# Patient Record
Sex: Female | Born: 1975
Health system: Southern US, Community
[De-identification: ages and names within clinical notes are randomized; demographics above are authoritative.]

## PROBLEM LIST (undated history)

## (undated) DIAGNOSIS — M1990 Unspecified osteoarthritis, unspecified site: Secondary | ICD-10-CM

## (undated) DIAGNOSIS — Z8669 Personal history of other diseases of the nervous system and sense organs: Secondary | ICD-10-CM

## (undated) DIAGNOSIS — L0390 Cellulitis, unspecified: Secondary | ICD-10-CM

## (undated) DIAGNOSIS — I10 Essential (primary) hypertension: Secondary | ICD-10-CM

## (undated) DIAGNOSIS — E785 Hyperlipidemia, unspecified: Secondary | ICD-10-CM

## (undated) DIAGNOSIS — Z1509 Genetic susceptibility to other malignant neoplasm: Secondary | ICD-10-CM

## (undated) DIAGNOSIS — R002 Palpitations: Secondary | ICD-10-CM

## (undated) DIAGNOSIS — I340 Nonrheumatic mitral (valve) insufficiency: Secondary | ICD-10-CM

## (undated) DIAGNOSIS — I209 Angina pectoris, unspecified: Secondary | ICD-10-CM

## (undated) DIAGNOSIS — I5189 Other ill-defined heart diseases: Secondary | ICD-10-CM

## (undated) DIAGNOSIS — T2002XA Burn of unspecified degree of lip(s), initial encounter: Secondary | ICD-10-CM

## (undated) DIAGNOSIS — I499 Cardiac arrhythmia, unspecified: Secondary | ICD-10-CM

## (undated) DIAGNOSIS — Z15068 Genetic susceptibility to other malignant neoplasm of digestive system: Secondary | ICD-10-CM

## (undated) DIAGNOSIS — I341 Nonrheumatic mitral (valve) prolapse: Secondary | ICD-10-CM

## (undated) DIAGNOSIS — K219 Gastro-esophageal reflux disease without esophagitis: Secondary | ICD-10-CM

## (undated) DIAGNOSIS — T7840XA Allergy, unspecified, initial encounter: Secondary | ICD-10-CM

## (undated) DIAGNOSIS — M6752 Plica syndrome, left knee: Secondary | ICD-10-CM

## (undated) DIAGNOSIS — G43009 Migraine without aura, not intractable, without status migrainosus: Secondary | ICD-10-CM

## (undated) DIAGNOSIS — K644 Residual hemorrhoidal skin tags: Secondary | ICD-10-CM

## (undated) DIAGNOSIS — Z9889 Other specified postprocedural states: Secondary | ICD-10-CM

## (undated) DIAGNOSIS — C4492 Squamous cell carcinoma of skin, unspecified: Secondary | ICD-10-CM

## (undated) DIAGNOSIS — IMO0002 Reserved for concepts with insufficient information to code with codable children: Secondary | ICD-10-CM

## (undated) DIAGNOSIS — D126 Benign neoplasm of colon, unspecified: Secondary | ICD-10-CM

## (undated) DIAGNOSIS — R011 Cardiac murmur, unspecified: Secondary | ICD-10-CM

## (undated) HISTORY — DX: Cardiac murmur, unspecified: R01.1

## (undated) HISTORY — DX: Residual hemorrhoidal skin tags: K64.4

## (undated) HISTORY — DX: Unspecified osteoarthritis, unspecified site: M19.90

## (undated) HISTORY — DX: Genetic susceptibility to other malignant neoplasm of digestive system: Z15.068

## (undated) HISTORY — DX: Essential (primary) hypertension: I10

## (undated) HISTORY — PX: TUBAL LIGATION: SHX77

## (undated) HISTORY — DX: Nonrheumatic mitral (valve) prolapse: I34.1

## (undated) HISTORY — DX: Genetic susceptibility to other malignant neoplasm: Z15.09

## (undated) HISTORY — PX: WISDOM TOOTH EXTRACTION: SHX21

## (undated) HISTORY — DX: Hyperlipidemia, unspecified: E78.5

## (undated) HISTORY — DX: Gastro-esophageal reflux disease without esophagitis: K21.9

## (undated) HISTORY — DX: Personal history of other diseases of the nervous system and sense organs: Z86.69

## (undated) HISTORY — DX: Benign neoplasm of colon, unspecified: D12.6

## (undated) HISTORY — PX: OTHER SURGICAL HISTORY: SHX169

## (undated) HISTORY — DX: Cellulitis, unspecified: L03.90

## (undated) HISTORY — DX: Reserved for concepts with insufficient information to code with codable children: IMO0002

## (undated) HISTORY — DX: Allergy, unspecified, initial encounter: T78.40XA

## (undated) HISTORY — DX: Squamous cell carcinoma of skin, unspecified: C44.92

## (undated) HISTORY — PX: POLYPECTOMY: SHX149

## (undated) HISTORY — PX: CARDIAC VALVE REPLACEMENT: SHX585

## (undated) HISTORY — PX: COLONOSCOPY: SHX174

## (undated) HISTORY — DX: Nonrheumatic mitral (valve) insufficiency: I34.0

## (undated) HISTORY — DX: Migraine without aura, not intractable, without status migrainosus: G43.009

## (undated) HISTORY — DX: Burn of unspecified degree of lip(s), initial encounter: T20.02XA

## (undated) HISTORY — PX: ABDOMINAL HYSTERECTOMY: SHX81

## (undated) HISTORY — DX: Palpitations: R00.2

---
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
## 1997-08-04 ENCOUNTER — Inpatient Hospital Stay (HOSPITAL_COMMUNITY): Admission: AD | Admit: 1997-08-04 | Discharge: 1997-08-04 | Payer: Self-pay | Admitting: Obstetrics

## 1997-08-13 ENCOUNTER — Inpatient Hospital Stay (HOSPITAL_COMMUNITY): Admission: AD | Admit: 1997-08-13 | Discharge: 1997-08-13 | Payer: Self-pay | Admitting: Obstetrics

## 1997-08-14 ENCOUNTER — Encounter (HOSPITAL_COMMUNITY): Admission: RE | Admit: 1997-08-14 | Discharge: 1997-08-16 | Payer: Self-pay | Admitting: Obstetrics & Gynecology

## 1997-08-15 ENCOUNTER — Inpatient Hospital Stay (HOSPITAL_COMMUNITY): Admission: AD | Admit: 1997-08-15 | Discharge: 1997-08-18 | Payer: Self-pay | Admitting: Obstetrics & Gynecology

## 1997-08-15 ENCOUNTER — Inpatient Hospital Stay (HOSPITAL_COMMUNITY): Admission: AD | Admit: 1997-08-15 | Discharge: 1997-08-16 | Payer: Self-pay | Admitting: *Deleted

## 1998-01-12 ENCOUNTER — Encounter: Admission: RE | Admit: 1998-01-12 | Discharge: 1998-01-12 | Payer: Self-pay | Admitting: *Deleted

## 1998-05-13 ENCOUNTER — Emergency Department (HOSPITAL_COMMUNITY): Admission: EM | Admit: 1998-05-13 | Discharge: 1998-05-13 | Payer: Self-pay | Admitting: Family Medicine

## 1998-08-31 ENCOUNTER — Inpatient Hospital Stay (HOSPITAL_COMMUNITY): Admission: AD | Admit: 1998-08-31 | Discharge: 1998-08-31 | Payer: Self-pay | Admitting: Obstetrics & Gynecology

## 1998-09-27 ENCOUNTER — Inpatient Hospital Stay (HOSPITAL_COMMUNITY): Admission: AD | Admit: 1998-09-27 | Discharge: 1998-09-27 | Payer: Self-pay | Admitting: Obstetrics

## 1998-10-08 ENCOUNTER — Ambulatory Visit (HOSPITAL_COMMUNITY): Admission: RE | Admit: 1998-10-08 | Discharge: 1998-10-08 | Payer: Self-pay | Admitting: *Deleted

## 1998-10-13 ENCOUNTER — Ambulatory Visit (HOSPITAL_COMMUNITY): Admission: RE | Admit: 1998-10-13 | Discharge: 1998-10-13 | Payer: Self-pay | Admitting: *Deleted

## 1998-11-13 ENCOUNTER — Ambulatory Visit (HOSPITAL_COMMUNITY): Admission: RE | Admit: 1998-11-13 | Discharge: 1998-11-13 | Payer: Self-pay | Admitting: *Deleted

## 1998-12-11 ENCOUNTER — Ambulatory Visit (HOSPITAL_COMMUNITY): Admission: RE | Admit: 1998-12-11 | Discharge: 1998-12-11 | Payer: Self-pay | Admitting: Obstetrics

## 1999-01-22 ENCOUNTER — Inpatient Hospital Stay (HOSPITAL_COMMUNITY): Admission: AD | Admit: 1999-01-22 | Discharge: 1999-01-22 | Payer: Self-pay | Admitting: *Deleted

## 1999-02-10 ENCOUNTER — Inpatient Hospital Stay (HOSPITAL_COMMUNITY): Admission: AD | Admit: 1999-02-10 | Discharge: 1999-02-10 | Payer: Self-pay | Admitting: Obstetrics & Gynecology

## 1999-02-11 ENCOUNTER — Encounter (HOSPITAL_COMMUNITY): Admission: RE | Admit: 1999-02-11 | Discharge: 1999-03-09 | Payer: Self-pay | Admitting: Obstetrics & Gynecology

## 1999-02-11 ENCOUNTER — Encounter: Payer: Self-pay | Admitting: Obstetrics & Gynecology

## 1999-02-24 ENCOUNTER — Encounter: Payer: Self-pay | Admitting: Obstetrics & Gynecology

## 1999-02-24 ENCOUNTER — Inpatient Hospital Stay (HOSPITAL_COMMUNITY): Admission: AD | Admit: 1999-02-24 | Discharge: 1999-02-24 | Payer: Self-pay | Admitting: Obstetrics

## 1999-03-08 ENCOUNTER — Inpatient Hospital Stay (HOSPITAL_COMMUNITY): Admission: AD | Admit: 1999-03-08 | Discharge: 1999-03-10 | Payer: Self-pay | Admitting: Obstetrics

## 1999-03-16 ENCOUNTER — Encounter (HOSPITAL_COMMUNITY): Admission: RE | Admit: 1999-03-16 | Discharge: 1999-06-14 | Payer: Self-pay | Admitting: Obstetrics

## 1999-04-16 ENCOUNTER — Encounter: Admission: RE | Admit: 1999-04-16 | Discharge: 1999-04-16 | Payer: Self-pay | Admitting: Obstetrics & Gynecology

## 1999-04-26 ENCOUNTER — Ambulatory Visit (HOSPITAL_COMMUNITY): Admission: RE | Admit: 1999-04-26 | Discharge: 1999-04-26 | Payer: Self-pay | Admitting: Obstetrics & Gynecology

## 1999-06-16 ENCOUNTER — Encounter (HOSPITAL_COMMUNITY): Admission: RE | Admit: 1999-06-16 | Discharge: 1999-09-14 | Payer: Self-pay | Admitting: *Deleted

## 1999-10-03 ENCOUNTER — Emergency Department (HOSPITAL_COMMUNITY): Admission: EM | Admit: 1999-10-03 | Discharge: 1999-10-03 | Payer: Self-pay | Admitting: Emergency Medicine

## 1999-10-15 ENCOUNTER — Encounter (HOSPITAL_COMMUNITY): Admission: RE | Admit: 1999-10-15 | Discharge: 2000-01-13 | Payer: Self-pay | Admitting: *Deleted

## 2000-01-14 ENCOUNTER — Encounter: Admission: RE | Admit: 2000-01-14 | Discharge: 2000-04-13 | Payer: Self-pay | Admitting: *Deleted

## 2000-01-27 ENCOUNTER — Inpatient Hospital Stay (HOSPITAL_COMMUNITY): Admission: EM | Admit: 2000-01-27 | Discharge: 2000-01-27 | Payer: Self-pay | Admitting: *Deleted

## 2000-02-03 ENCOUNTER — Encounter: Payer: Self-pay | Admitting: Endocrinology

## 2000-02-03 ENCOUNTER — Ambulatory Visit (HOSPITAL_COMMUNITY): Admission: RE | Admit: 2000-02-03 | Discharge: 2000-02-03 | Payer: Self-pay | Admitting: Endocrinology

## 2000-04-15 ENCOUNTER — Encounter: Admission: RE | Admit: 2000-04-15 | Discharge: 2000-07-14 | Payer: Self-pay | Admitting: *Deleted

## 2000-07-16 ENCOUNTER — Encounter: Admission: RE | Admit: 2000-07-16 | Discharge: 2000-08-22 | Payer: Self-pay | Admitting: *Deleted

## 2000-12-23 ENCOUNTER — Emergency Department (HOSPITAL_COMMUNITY): Admission: EM | Admit: 2000-12-23 | Discharge: 2000-12-23 | Payer: Self-pay | Admitting: *Deleted

## 2000-12-27 ENCOUNTER — Emergency Department (HOSPITAL_COMMUNITY): Admission: EM | Admit: 2000-12-27 | Discharge: 2000-12-27 | Payer: Self-pay | Admitting: Emergency Medicine

## 2001-03-22 ENCOUNTER — Other Ambulatory Visit: Admission: RE | Admit: 2001-03-22 | Discharge: 2001-03-22 | Payer: Self-pay | Admitting: Internal Medicine

## 2001-06-24 ENCOUNTER — Emergency Department (HOSPITAL_COMMUNITY): Admission: EM | Admit: 2001-06-24 | Discharge: 2001-06-24 | Payer: Self-pay | Admitting: Emergency Medicine

## 2001-06-24 ENCOUNTER — Encounter: Payer: Self-pay | Admitting: Emergency Medicine

## 2001-10-08 ENCOUNTER — Emergency Department (HOSPITAL_COMMUNITY): Admission: EM | Admit: 2001-10-08 | Discharge: 2001-10-08 | Payer: Self-pay | Admitting: Emergency Medicine

## 2002-10-10 ENCOUNTER — Inpatient Hospital Stay (HOSPITAL_COMMUNITY): Admission: AD | Admit: 2002-10-10 | Discharge: 2002-10-10 | Payer: Self-pay | Admitting: *Deleted

## 2002-10-12 ENCOUNTER — Inpatient Hospital Stay (HOSPITAL_COMMUNITY): Admission: AD | Admit: 2002-10-12 | Discharge: 2002-10-12 | Payer: Self-pay | Admitting: *Deleted

## 2002-10-14 ENCOUNTER — Inpatient Hospital Stay (HOSPITAL_COMMUNITY): Admission: AD | Admit: 2002-10-14 | Discharge: 2002-10-14 | Payer: Self-pay | Admitting: Obstetrics and Gynecology

## 2002-10-16 ENCOUNTER — Inpatient Hospital Stay (HOSPITAL_COMMUNITY): Admission: AD | Admit: 2002-10-16 | Discharge: 2002-10-16 | Payer: Self-pay | Admitting: Obstetrics and Gynecology

## 2003-06-16 ENCOUNTER — Inpatient Hospital Stay (HOSPITAL_COMMUNITY): Admission: AD | Admit: 2003-06-16 | Discharge: 2003-06-16 | Payer: Self-pay | Admitting: Family Medicine

## 2004-01-22 ENCOUNTER — Emergency Department (HOSPITAL_COMMUNITY): Admission: EM | Admit: 2004-01-22 | Discharge: 2004-01-22 | Payer: Self-pay | Admitting: *Deleted

## 2004-04-05 ENCOUNTER — Other Ambulatory Visit: Admission: RE | Admit: 2004-04-05 | Discharge: 2004-04-05 | Payer: Self-pay | Admitting: Obstetrics and Gynecology

## 2004-06-02 ENCOUNTER — Ambulatory Visit: Payer: Self-pay | Admitting: Gastroenterology

## 2005-01-21 ENCOUNTER — Other Ambulatory Visit: Admission: RE | Admit: 2005-01-21 | Discharge: 2005-01-21 | Payer: Self-pay | Admitting: Obstetrics and Gynecology

## 2005-08-19 ENCOUNTER — Ambulatory Visit: Payer: Self-pay | Admitting: Internal Medicine

## 2005-11-15 ENCOUNTER — Encounter: Admission: RE | Admit: 2005-11-15 | Discharge: 2005-11-15 | Payer: Self-pay | Admitting: Internal Medicine

## 2005-12-28 ENCOUNTER — Ambulatory Visit: Payer: Self-pay | Admitting: Internal Medicine

## 2006-01-09 ENCOUNTER — Ambulatory Visit: Payer: Self-pay

## 2006-01-09 ENCOUNTER — Encounter: Payer: Self-pay | Admitting: Cardiology

## 2006-01-12 ENCOUNTER — Ambulatory Visit: Payer: Self-pay | Admitting: Cardiology

## 2006-03-22 ENCOUNTER — Ambulatory Visit: Payer: Self-pay | Admitting: Gastroenterology

## 2006-08-06 ENCOUNTER — Emergency Department (HOSPITAL_COMMUNITY): Admission: EM | Admit: 2006-08-06 | Discharge: 2006-08-06 | Payer: Self-pay | Admitting: Emergency Medicine

## 2006-08-11 ENCOUNTER — Ambulatory Visit: Payer: Self-pay | Admitting: Cardiology

## 2006-10-17 ENCOUNTER — Other Ambulatory Visit: Admission: RE | Admit: 2006-10-17 | Discharge: 2006-10-17 | Payer: Self-pay | Admitting: Obstetrics and Gynecology

## 2007-03-16 ENCOUNTER — Encounter: Admission: RE | Admit: 2007-03-16 | Discharge: 2007-03-16 | Payer: Self-pay | Admitting: Gastroenterology

## 2007-04-02 ENCOUNTER — Encounter: Admission: RE | Admit: 2007-04-02 | Discharge: 2007-04-02 | Payer: Self-pay | Admitting: Gastroenterology

## 2007-08-01 ENCOUNTER — Encounter (INDEPENDENT_AMBULATORY_CARE_PROVIDER_SITE_OTHER): Payer: Self-pay | Admitting: Family Medicine

## 2007-08-01 ENCOUNTER — Ambulatory Visit (HOSPITAL_COMMUNITY): Admission: RE | Admit: 2007-08-01 | Discharge: 2007-08-01 | Payer: Self-pay | Admitting: Family Medicine

## 2007-08-01 ENCOUNTER — Encounter: Payer: Self-pay | Admitting: Internal Medicine

## 2007-08-01 ENCOUNTER — Ambulatory Visit: Payer: Self-pay | Admitting: Vascular Surgery

## 2007-11-06 ENCOUNTER — Ambulatory Visit: Payer: Self-pay | Admitting: Internal Medicine

## 2007-11-06 DIAGNOSIS — J309 Allergic rhinitis, unspecified: Secondary | ICD-10-CM | POA: Insufficient documentation

## 2007-11-06 DIAGNOSIS — F329 Major depressive disorder, single episode, unspecified: Secondary | ICD-10-CM

## 2007-11-06 DIAGNOSIS — K219 Gastro-esophageal reflux disease without esophagitis: Secondary | ICD-10-CM | POA: Insufficient documentation

## 2007-11-23 ENCOUNTER — Other Ambulatory Visit: Admission: RE | Admit: 2007-11-23 | Discharge: 2007-11-23 | Payer: Self-pay | Admitting: Obstetrics and Gynecology

## 2008-03-18 ENCOUNTER — Encounter: Payer: Self-pay | Admitting: Internal Medicine

## 2008-03-26 ENCOUNTER — Ambulatory Visit: Payer: Self-pay | Admitting: Internal Medicine

## 2008-03-26 DIAGNOSIS — G9340 Encephalopathy, unspecified: Secondary | ICD-10-CM | POA: Insufficient documentation

## 2008-03-26 DIAGNOSIS — G43009 Migraine without aura, not intractable, without status migrainosus: Secondary | ICD-10-CM

## 2008-03-27 ENCOUNTER — Telehealth (INDEPENDENT_AMBULATORY_CARE_PROVIDER_SITE_OTHER): Payer: Self-pay | Admitting: *Deleted

## 2008-04-09 ENCOUNTER — Ambulatory Visit: Payer: Self-pay | Admitting: Internal Medicine

## 2008-04-09 LAB — CONVERTED CEMR LAB
Crystals: NEGATIVE
Ketones, ur: NEGATIVE mg/dL
Leukocytes, UA: NEGATIVE
Nitrite: NEGATIVE
Sed Rate: 15 mm/hr (ref 0–22)
Specific Gravity, Urine: 1.025 (ref 1.000–1.03)
Urobilinogen, UA: 0.2 (ref 0.0–1.0)
pH: 6.5 (ref 5.0–8.0)

## 2008-04-10 ENCOUNTER — Encounter: Payer: Self-pay | Admitting: Internal Medicine

## 2008-04-10 DIAGNOSIS — A6000 Herpesviral infection of urogenital system, unspecified: Secondary | ICD-10-CM | POA: Insufficient documentation

## 2008-04-10 LAB — CONVERTED CEMR LAB

## 2008-07-14 ENCOUNTER — Ambulatory Visit: Payer: Self-pay | Admitting: Cardiology

## 2008-07-14 LAB — CONVERTED CEMR LAB
BUN: 9 mg/dL (ref 6–23)
CO2: 25 meq/L (ref 19–32)
Sodium: 140 meq/L (ref 135–145)

## 2008-07-21 ENCOUNTER — Encounter: Payer: Self-pay | Admitting: Cardiology

## 2008-07-21 ENCOUNTER — Ambulatory Visit: Payer: Self-pay

## 2008-07-28 ENCOUNTER — Ambulatory Visit: Payer: Self-pay | Admitting: Internal Medicine

## 2008-07-28 ENCOUNTER — Telehealth (INDEPENDENT_AMBULATORY_CARE_PROVIDER_SITE_OTHER): Payer: Self-pay | Admitting: *Deleted

## 2008-07-29 ENCOUNTER — Emergency Department (HOSPITAL_COMMUNITY): Admission: EM | Admit: 2008-07-29 | Discharge: 2008-07-29 | Payer: Self-pay | Admitting: Emergency Medicine

## 2008-07-31 ENCOUNTER — Telehealth (INDEPENDENT_AMBULATORY_CARE_PROVIDER_SITE_OTHER): Payer: Self-pay | Admitting: *Deleted

## 2008-08-04 ENCOUNTER — Ambulatory Visit: Payer: Self-pay | Admitting: Cardiology

## 2008-12-29 ENCOUNTER — Ambulatory Visit: Payer: Self-pay | Admitting: Internal Medicine

## 2009-02-05 ENCOUNTER — Ambulatory Visit: Payer: Self-pay | Admitting: Internal Medicine

## 2009-07-16 ENCOUNTER — Ambulatory Visit: Payer: Self-pay | Admitting: Internal Medicine

## 2009-08-14 ENCOUNTER — Telehealth: Payer: Self-pay | Admitting: Internal Medicine

## 2009-09-28 ENCOUNTER — Ambulatory Visit: Payer: Self-pay | Admitting: Cardiology

## 2009-09-28 DIAGNOSIS — R002 Palpitations: Secondary | ICD-10-CM | POA: Insufficient documentation

## 2009-09-28 DIAGNOSIS — I059 Rheumatic mitral valve disease, unspecified: Secondary | ICD-10-CM | POA: Insufficient documentation

## 2009-10-13 ENCOUNTER — Ambulatory Visit: Payer: Self-pay | Admitting: Internal Medicine

## 2009-10-13 DIAGNOSIS — L03211 Cellulitis of face: Secondary | ICD-10-CM

## 2009-10-13 DIAGNOSIS — T2002XA Burn of unspecified degree of lip(s), initial encounter: Secondary | ICD-10-CM | POA: Insufficient documentation

## 2009-10-13 DIAGNOSIS — L0201 Cutaneous abscess of face: Secondary | ICD-10-CM

## 2009-10-22 ENCOUNTER — Other Ambulatory Visit: Admission: RE | Admit: 2009-10-22 | Discharge: 2009-10-22 | Payer: Self-pay | Admitting: Obstetrics and Gynecology

## 2010-01-25 ENCOUNTER — Telehealth: Payer: Self-pay | Admitting: Gastroenterology

## 2010-01-25 ENCOUNTER — Encounter: Payer: Self-pay | Admitting: Physician Assistant

## 2010-01-25 ENCOUNTER — Ambulatory Visit: Payer: Self-pay | Admitting: Internal Medicine

## 2010-01-25 DIAGNOSIS — K644 Residual hemorrhoidal skin tags: Secondary | ICD-10-CM | POA: Insufficient documentation

## 2010-01-25 DIAGNOSIS — K648 Other hemorrhoids: Secondary | ICD-10-CM | POA: Insufficient documentation

## 2010-01-25 DIAGNOSIS — K625 Hemorrhage of anus and rectum: Secondary | ICD-10-CM

## 2010-02-12 ENCOUNTER — Ambulatory Visit: Payer: Self-pay | Admitting: Gastroenterology

## 2010-02-16 ENCOUNTER — Encounter: Payer: Self-pay | Admitting: Gastroenterology

## 2010-02-17 ENCOUNTER — Telehealth: Payer: Self-pay | Admitting: Internal Medicine

## 2010-03-03 ENCOUNTER — Telehealth: Payer: Self-pay | Admitting: Cardiology

## 2010-08-03 NOTE — Progress Notes (Signed)
Summary: WEIGHT LOSS  Phone Note Call from Patient Call back at Work Phone [phone]   Summary of Call: Patient is requesting rx to help with weight loss.  Initial call taken by: Lamar Sprinkles, CMA,  August 14, 2009 8:46 AM  Follow-up for Phone Call        the  best advice that actually works is to be more active, and take in less calories; I would suggest W[employer] Wat[employer], as this is the ONLY thing that has been shown to work for LONG term wt loss Follow-up by: Corwin Levins MD,  August 14, 2009 9:26 AM  Additional Follow-up for Phone Call Additional follow up Details #1::        pt informed of MD's advise Additional Follow-up by: Margaret Pyle, CMA,  August 14, 2009 10:55 AM

## 2010-08-03 NOTE — Letter (Signed)
Summary: [facility] Instructions  [facility]  [address] [place], [place] [address]   Phone: [phone]  Fax: [phone]       [name]    10-Nov-1975    MRN: [id number]        Procedure Day /Date:FRIDAY 02/12/2010     Arrival Time:1PM     Procedure Time:2PM     Location of Procedure:                    X   [facility] (4th Floor)                        PREPARATION FOR COLONOSCOPY WITH MOVIPREP   Starting 5 days prior to your procedure 02/07/2010 do not eat nuts, seeds, popcorn, corn, beans, peas,  salads, or any raw vegetables.  Do not take any fiber supplements (e.g. Metamucil, Citrucel, and Benefiber).  THE DAY BEFORE YOUR PROCEDURE         DATE: 02/11/2010 [id number]  1.  Drink clear liquids the entire day-NO SOLID FOOD  2.  Do not drink anything colored red or purple.  Avoid juices with pulp.  No orange juice.  3.  Drink at least 64 oz. (8 glasses) of fluid/clear liquids during the day to prevent dehydration and help the prep work efficiently.  CLEAR LIQUIDS INCLUDE: Water Jello Ice Popsicles Tea (sugar ok, no milk/cream) Powdered fruit flavored drinks Coffee (sugar ok, no milk/cream) Gatorade Juice: apple, white grape, white cranberry  Lemonade Clear bullion, consomm, broth Carbonated beverages (any kind) Strained chicken noodle soup Hard Candy                             4.  In the morning, mix first dose of MoviPrep solution:    Empty 1 Pouch A and 1 Pouch B into the disposable container    Add lukewarm drinking water to the top line of the container. Mix to dissolve    Refrigerate (mixed solution should be used within 24 hrs)  5.  Begin drinking the prep at 5:00 p.m. The MoviPrep container is divided by 4 marks.   Every 15 minutes drink the solution down to the next mark (approximately 8 oz) until the full liter is complete.   6.  Follow completed prep with 16 oz of clear liquid of your choice (Nothing red  or purple).  Continue to drink clear liquids until bedtime.  7.  Before going to bed, mix second dose of MoviPrep solution:    Empty 1 Pouch A and 1 Pouch B into the disposable container    Add lukewarm drinking water to the top line of the container. Mix to dissolve    Refrigerate  THE DAY OF YOUR PROCEDURE      DATE:02/12/2010 [id number]  Beginning at 9a.m. (5 hours before procedure):         1. Every 15 minutes, drink the solution down to the next mark (approx 8 oz) until the full liter is complete.  2. Follow completed prep with 16 oz. of clear liquid of your choice.    3. You may drink clear liquids until 12PM (2 HOURS BEFORE PROCEDURE).   MEDICATION INSTRUCTIONS  Unless otherwise instructed, you should take regular prescription medications with a small sip of water   as early as possible the morning of your procedure.  OTHER INSTRUCTIONS  You will need a responsible adult at least 36 years of age to accompany you and drive you home.   This person must remain in the waiting room during your procedure.  Wear loose fitting clothing that is easily removed.  Leave jewelry and other valuables at home.  However, you may wish to bring a book to read or  an iPod/MP3 player to listen to music as you wait for your procedure to start.  Remove all body piercing jewelry and leave at home.  Total time from sign-in until discharge is approximately 2-3 hours.  You should go home directly after your procedure and rest.  You can resume normal activities the  day after your procedure.  The day of your procedure you should not:   Drive   Make legal decisions   Operate machinery   Drink alcohol   Return to work  You will receive specific instructions about eating, activities and medications before you leave.    The above instructions have been reviewed and explained to me by   _______________________    I fully understand and can verbalize these instructions  _____________________________ Date _________

## 2010-08-03 NOTE — Progress Notes (Signed)
Summary: RX PROBLEM  Phone Note Call from Patient Call back at Home Phone [phone] Call back at Work Phone [phone]   Summary of Call: Pt says klonopin has not helped at all, does she need increased dose or alt med? Please advise.  Initial call taken by: Lamar Sprinkles, CMA,  February 17, 2010 11:36 AM  Follow-up for Phone Call        I would not increase the dose, but we can add generic prozac - 20 mg per day, as this normally helps Follow-up by: Corwin Levins MD,  February 17, 2010 12:20 PM  Additional Follow-up for Phone Call Additional follow up Details #1::        Pt informed Additional Follow-up by: Margaret Pyle, CMA,  February 17, 2010 2:04 PM    New/Updated Medications: FLUOXETINE HCL 20 MG CAPS (FLUOXETINE HCL) 1po once daily Prescriptions: FLUOXETINE HCL 20 MG CAPS (FLUOXETINE HCL) 1po once daily  #90 x 3   Entered and Authorized by:   Corwin Levins MD   Signed by:   Corwin Levins MD on 02/17/2010   Method used:   Electronically to        [facility] [address] [phone]* (retail)       [address] [place], [place]  [address]       Ph: [phone]       Fax: [phone]   RxID:   [id number]

## 2010-08-03 NOTE — Assessment & Plan Note (Signed)
Summary: ANXIOUS FEELING STC   Vital Signs:  Patient profile:   35 year old female Height:      64 inches (162.56 cm) Weight:      171.38 pounds (77.90 kg) O2 Sat:      99 % on Room air Temp:     99.1 degrees F (37.28 degrees C) oral Pulse rate:   95 / minute Pulse rhythm:   regular Resp:     16 per minute BP sitting:   112 / 80  (left arm) Cuff size:   regular  Vitals Entered By: Josph Macho CMA (July 16, 2009 3:21 PM)  O2 Flow:  Room air CC: Anxious Feeling X53months but worse X1week/ CF, Depression   CC:  Anxious Feeling X7months but worse X1week/ CF and Depression.  Depression History:      The patient is having a depressed mood most of the day and has a diminished interest in her usual daily activities.  Positive alarm features for depression include significant weight gain, insomnia, and fatigue (loss of energy).  However, she denies significant weight loss, hypersomnia, psychomotor agitation, psychomotor retardation, feelings of worthlessness (guilt), impaired concentration (indecisiveness), and recurrent thoughts of death or suicide.  The patient denies symptoms of a manic disorder including persistently & abnormally elevated mood, abnormally & persistently irritable mood, distractibility, flight of ideas, excessive buying sprees, and excessive sexual indiscretions.        Psychosocial stress factors include major life changes.  Risk factors for depression include a personal history of depression.  The patient denies that she feels like life is not worth living, denies that she wishes that she were dead, and denies that she has thought about ending her life.         Preventive Screening-Counseling & Management  Alcohol-Tobacco     Alcohol drinks/day: 0     Smoking Status: quit  Hep-HIV-STD-Contraception     Hepatitis Risk: no risk noted     HIV Risk: no risk noted     STD Risk: no risk noted      Sexual History:  currently monogamous.        Drug Use:  no.         Blood Transfusions:  no.    Clinical Review Panels:  Complete Metabolic Panel   Glucose:  99 (07/14/2008)   Sodium:  140 (07/14/2008)   Potassium:  4.3 (07/14/2008)   Chloride:  109 (07/14/2008)   CO2:  25 (07/14/2008)   BUN:  9 (07/14/2008)   Creatinine:  0.8 (07/14/2008)   Calcium:  9.1 (07/14/2008)   Medications Prior to Update: 1)  Clarinex 5 Mg Tabs (Desloratadine) .... Once Daily For 5 Days 2)  Epipen 2-Pak 0.3 Mg/0.29ml Devi (Epinephrine) .... Use As Directed  Current Medications (verified): 1)  Clarinex 5 Mg Tabs (Desloratadine) .... Once Daily For 5 Days 2)  Epipen 2-Pak 0.3 Mg/0.90ml Devi (Epinephrine) .... Use As Directed 3)  Solodyn 85 Mg .Marland Kitchen.. 1 Once Daily 4)  Clarifoam 5)  Differin  Allergies (verified): No Known Drug Allergies  Past History:  Past Medical History: Reviewed history from 07/14/2008 and no changes required. MVP Mild MR Palpitations Symptomatic PVCs GERD Allergic rhinitis Depression Migraine  Past Surgical History: Reviewed history from 11/06/2007 and no changes required. Tubal ligation  Family History: Reviewed history from 11/06/2007 and no changes required. 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  The patient denies anorexia, chest pain, and abdominal pain.   Psych:  Complains of anxiety, easily angered, irritability, and panic attacks; denies alternate hallucination ( auditory/visual), sense of great danger, suicidal thoughts/plans, thoughts of violence, unusual visions or sounds, and thoughts /plans of harming others.  Physical Exam  General:  alert,  well-developed, well-nourished, well-hydrated, normal appearance, cooperative to examination, good hygiene, and overweight-appearing.   Mouth:  Oral mucosa and oropharynx without lesions or exudates.  Teeth in good repair. Neck:  supple, full ROM, no masses, no carotid bruits, and no neck tenderness.   Lungs:  Normal respiratory effort, chest expands symmetrically. Lungs are clear to auscultation, no crackles or wheezes. Heart:  Normal rate and regular rhythm. S1 and S2 normal without gallop, murmur, click, rub or other extra sounds. Skin:  turgor normal, color normal, no rashes, no suspicious lesions, no ecchymoses, no petechiae, no purpura, no ulcerations, and no edema.   Psych:  Oriented X3, memory intact for recent and remote, good eye contact, not anxious appearing, not agitated, not suicidal, not homicidal, flat affect, and subdued.     Impression & Recommendations:  Problem # 1:  DEPRESSION (ICD-311) Assessment Deteriorated  Her updated medication list for this problem includes:    Cymbalta 30 Mg Cpep (Duloxetine hcl) ..... One by mouth once daily    Klonopin 1 Mg Tabs (Clonazepam) ..... One by mouth two times a day as needed anxiety/panic  Discussed treatment options, including trial of antidpressant medication. Will refer to behavioral health. Follow-up call in in 24-48 hours and recheck in 2 weeks, sooner as needed. Patient agrees to call if any worsening of symptoms or thoughts of doing harm arise. Verified that the patient has no suicidal ideation at this time.   Complete Medication List: 1)  Clarinex 5 Mg Tabs (Desloratadine) .... Once daily for 5 days 2)  Epipen 2-pak 0.3 Mg/0.15ml Devi (Epinephrine) .... Use as directed 3)  Solodyn 85 Mg  .Marland Kitchen.. 1 once daily 4)  Clarifoam  5)  Differin  6)  Cymbalta 30 Mg Cpep (Duloxetine hcl) .... One by mouth once daily 7)  Klonopin 1 Mg Tabs (Clonazepam) .... One by mouth two times a day as needed anxiety/panic  Patient Instructions: 1)   Please schedule a follow-up appointment in 1 month. Prescriptions: KLONOPIN 1 MG TABS (CLONAZEPAM) One by mouth two times a day as needed anxiety/panic  #60 x 2   Entered and Authorized by:   Etta Grandchild MD   Signed by:   Etta Grandchild MD on 07/16/2009   Method used:   Print then Give to Patient   RxID:   [id number] CYMBALTA 30 MG CPEP (DULOXETINE HCL) one by mouth once daily  #42 x 0   Entered and Authorized by:   Etta Grandchild MD   Signed by:   Etta Grandchild MD on 07/16/2009   Method used:   Samples Given   RxID:   [id number]

## 2010-08-03 NOTE — Assessment & Plan Note (Signed)
Summary: F1Y/JSS  Medications Added * CLARIFOAM two times a day  for skin face * DIFFERIN once daily for skin      Allergies Added: NKDA  Visit Type:  1 yr f/u Primary Provider:  Corwin Levins MD  CC:  shortness of breath and still dizziness sometimes.  History of Present Illness: [name] comes in today for evaluation and management of her mitral valve prolapse and palpitations. She has an atypical chest pain at times and spontaneous.  She has lost about 16 pounds and is exercising on a regular basis. Her symptoms are better with exercise.  Her last echocardiogram was in January 2010. She is mild to moderate prolapse of both mitral valve leaflets with mild to moderate mitral regurgitation. Left atrium, left ventricle, and right sided function and size are normal. We also check blood work including potassium magnesium and TSH which were normal.  Current Medications (verified): 1)  Epipen 2-Pak 0.3 Mg/0.7ml Devi (Epinephrine) .... Use As Directed 2)  Solodyn 85 Mg .Marland Kitchen.. 1 Once Daily 3)  Clarifoam .... Two Times A Day  For Skin Face 4)  Differin .... Once Daily For Skin 5)  Klonopin 1 Mg Tabs (Clonazepam) .... 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 Neck:  Neck supple, no JVD. No masses, thyromegaly or abnormal cervical nodes. Chest Sawyer Kahan:  no deformities or breast masses noted Lungs:  Clear bilaterally to auscultation and percussion. Heart:  regular rate and rhythm, normal S1-S2, midsystolic click, soft systolic murmur at the apex Abdomen:  Bowel sounds positive; abdomen soft and non-tender without masses, organomegaly, or hernias noted. No hepatosplenomegaly. Msk:  Back normal, normal gait. Muscle strength and tone normal. Pulses:  pulses normal in all 4 extremities Extremities:  No clubbing or cyanosis. Neurologic:  Alert and oriented x 3. Skin:  Intact without lesions or rashes. Psych:  Normal affect.   Problems:  Medical Problems Added: 1)  Dx of Palpitations  (ICD-785.1) 2)  Dx of Mitral Valve Prolapse, Non-rheumatic  (ICD-424.0)  EKG  Procedure date:  09/28/2009  Findings:      normal sinus rhythm, no change, early repolarization  Impression & Recommendations:  Problem # 1:  MITRAL VALVE PROLAPSE, NON-RHEUMATIC (ICD-424.0) I explained to her at length the findings of the echocardiogram in January of 2010 heart model. She has mild to moderate prolapse of both leaflets with mild to moderate  mitral regurgitation. Her left atrium was still normal in size as well as LV function. There is no evidence of pulmonary hypertension. We'll can continue to treat conservatively for now. She does not need prophylaxis for dental procedures. I will see her back in a year. Exercise was encouraged as was continued weight loss. Orders: EKG w/ Interpretation (93000)  Problem # 2:  PALPITATIONS  (ICD-785.1) Assessment: Unchanged Feel benign and related to her prolapse. Exercise encouraged. Symptoms of atrial fib or SVT reviewed.  Patient Instructions: 1)  Your physician recommends that you schedule a follow-up appointment in: YEAR WITH DR Damarco Keysor 2)  Your physician recommends that you continue on your current medications as directed. Please refer to the Current Medication list given to you today.

## 2010-08-03 NOTE — Progress Notes (Signed)
Summary: Triage   Phone Note Call from Patient Call back at Work Phone [phone]   Caller: Patient Call For: Dr. Russella Dar Reason for Call: Talk to Nurse Summary of Call: painful, swollen hemorroids. Rectal bleeding....requesting to be seen soon Initial call taken by: Karna Christmas,  January 25, 2010 8:28 AM  Follow-up for Phone Call        patient c/o severe rectal pain and burning.  She believes this is hemorrhoidal.  Patient  will come in this afternoon and see Mike Gip PA  Follow-up by: Darcey Nora RN, CGRN,  January 25, 2010 8:50 AM

## 2010-08-03 NOTE — Progress Notes (Signed)
Summary: CT SCAN  Phone Note Call from Patient Call back at Home Phone [phone]   Caller: [phone] Call For: Corwin Levins MD Summary of Call: PER .PT CALL WANT TO HAVE A CT SCAN , WANT TO BE REFERED FOR HER BAD HEADACHE Initial call taken by: Shelbie Proctor,  July 31, 2008 9:59 AM  Follow-up for Phone Call        ok for ct head and ct sinus without contrast - ordered  Follow-up by: Corwin Levins MD,  July 31, 2008 10:52 AM  Additional Follow-up for Phone Call Additional follow up Details #1::        CALLED PT TO INFORM THAT [facility] WILL CONTACT HER HER REGUARDING APPT MADE AWARE Additional Follow-up by: Shelbie Proctor,  July 31, 2008 11:13 AM  New Problems: HEADACHE (ICD-784.0)   New Problems: HEADACHE (ICD-784.0)

## 2010-08-03 NOTE — Assessment & Plan Note (Signed)
Summary: BURN ON LIP/ NWS   Vital Signs:  Patient profile:   35 year old female Height:      63 inches Weight:      160.25 pounds BMI:     28.49 O2 Sat:      98 % on Room air Temp:     98.2 degrees F oral Pulse rate:   72 / minute BP sitting:   110 / 62  (left arm) Cuff size:   regular  Vitals Entered ByZella Ball Ewing (October 13, 2009 1:55 PM)  O2 Flow:  Room air CC: Burn on Lip/RE   Primary Care Provider:  Corwin Levins MD  CC:  Burn on Lip/RE.  History of Present Illness: here with onset lip problem since sunday AM last when she accidently touched the lower lip on the right wtih a hot utensil;  had immediate pain and minor swelling, but really starting getting more swollen, tender,  and small pus like material expressed this AM, all without fever , headache, malaise or other swelling such as submandibular LNodes;  Also with some redness to the right face near the lip as well nondiscrete with no swelling, and no red streaks.    Problems Prior to Update: 1)  Cellulitis, Face, Right  (ICD-682.0) 2)  Burn of Unspecified Degree of Lip  (ICD-941.03) 3)  Palpitations  (ICD-785.1) 4)  Mitral Valve Prolapse, Non-rheumatic  (ICD-424.0) 5)  Genital Herpes  (ICD-054.10) 6)  Common Migraine  (ICD-346.10) 7)  Depression  (ICD-311) 8)  Allergic Rhinitis  (ICD-477.9) 9)  Gerd  (ICD-530.81)  Medications Prior to Update: 1)  Epipen 2-Pak 0.3 Mg/0.57ml Devi (Epinephrine) .... Use As Directed 2)  Solodyn 85 Mg .Marland Kitchen.. 1 Once Daily 3)  Clarifoam .... Two Times A Day  For Skin Face 4)  Differin .... Once Daily For Skin 5)  Klonopin 1 Mg Tabs (Clonazepam) .... One By Mouth Two Times A Day As Needed Anxiety/panic  Current Medications (verified): 1)  Epipen 2-Pak 0.3 Mg/0.26ml Devi (Epinephrine) .... Use As Directed 2)  Solodyn 85 Mg .Marland Kitchen.. 1 Once Daily 3)  Clarifoam .... Two Times A Day  For Skin Face 4)  Differin .... Once Daily For Skin 5)  Klonopin 1 Mg Tabs (Clonazepam) .... One By Mouth Two  Times A Day As Needed Anxiety/panic 6)  Silvadene 1 % Crea (Silver Sulfadiazine) .... Use Asd To Affected Area Three Times A Day Until Healed 7)  Doxycycline Hyclate 100 Mg Caps (Doxycycline Hyclate) .Marland Kitchen.. 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   Head:  normocephalic and atraumatic.   Eyes:  vision grossly intact, pupils equal, and pupils round.   Ears:  R ear normal and L ear normal.   Nose:  no external deformity and no nasal discharge.   Mouth:  no gingival abnormalities and pharynx pink and moist.  , but right lower lip 1.5 cm area with 2+ sweling, tender but non fluctuant and  no material expressed, also some mild erythema nondiscrete without swelling adjacent right face  Neck:  supple and no masses.   Lungs:  normal respiratory effort and normal breath sounds.   Heart:  normal rate and regular rhythm.   Extremities:  no edema, no erythema    Impression & Recommendations:  Problem # 1:  BURN OF UNSPECIFIED DEGREE OF LIP (ICD-941.03) left lower lateral lip  - for silvadene  cream as directed  Problem # 2:  CELLULITIS, FACE, RIGHT (ICD-682.0)  Her updated medication list for this problem includes:    Doxycycline Hyclate 100 Mg Caps (Doxycycline hyclate) .Marland Kitchen... 1po two times a day treat as above, f/u any worsening signs or symptoms   Complete Medication List: 1)  Epipen 2-pak 0.3 Mg/0.68ml  Devi (Epinephrine) .... Use as directed 2)  Solodyn 85 Mg  .Marland Kitchen.. 1 once daily 3)  Clarifoam  .... Two times a day  for skin face 4)  Differin  .... Once daily for skin 5)  Klonopin 1 Mg Tabs (Clonazepam) .... One by mouth two times a day as needed anxiety/panic 6)  Silvadene 1 % Crea (Silver sulfadiazine) .... Use asd to affected area three times a day until healed 7)  Doxycycline Hyclate 100 Mg Caps (Doxycycline hyclate) .Marland Kitchen.. 1po two times a day  Patient Instructions: 1)  Please take all new medications as prescribed 2)  Continue all previous medications as before this visit  3)  Please schedule a follow-up appointment as needed. Prescriptions: DOXYCYCLINE HYCLATE 100 MG CAPS (DOXYCYCLINE HYCLATE) 1po two times a day  #20 x 0   Entered and Authorized by:   Corwin Levins MD   Signed by:   Corwin Levins MD on 10/13/2009   Method used:   Print then Give to Patient   RxID:   [id number] SILVADENE 1 % CREA (SILVER SULFADIAZINE) use asd to affected area three times a day until healed  #1 x 0   Entered and Authorized by:   Corwin Levins MD   Signed by:   Corwin Levins MD on 10/13/2009   Method used:   Print then Give to Patient   RxID:   [id number]

## 2010-08-03 NOTE — Assessment & Plan Note (Signed)
Summary: hemorrhoids/sheri    History of Present Illness Visit Type: Initial Visit Primary GI MD: Elie Goody MD [facility] Primary Provider: [name], MD Chief Complaint: Patient complains of hemorrhoids that cause her rectal pain and some BRB. She states that she has been dealing with this for a long time. She does also complain of some constipation.  History of Present Illness:   PLEASANT 35 Y.O FEMALE KNOWN TO DR Russella Dar. SHE WAS SEEN PREVIOUSLY IN 2007 WITH GERD SXS. SHE COMES IN TODAY WITH C/O HEMORRHOID PROBLEMS OVER THE PAST MONTH. SHE HAS HAD SOME SXS OFF AND ON OVER THE PAST 12 YEARS SINCE CHILDBIRTH. NOW HAS HAD RECTAL PRESSURE/DISCOMFORT, AND INTERMITTENT BRB PER RECTUM  X ONE MONTH.BLOOD GENERALLY SMALL AMT ON THE TISSUE. HER BM'S ARE NORMAL, NO CONSTIPATION,STRAINING ETC. NO ABDOMINAL PAIN. SHE DOES HAVE FAMILY HX OF COLON CANCER IN  A PATERNAL AUNT WHICH WAS DX IN HER 30'S .PT HAS NOT HAD PRIOR COLONOSCOPY.   GI Review of Systems      Denies abdominal pain, acid reflux, belching, bloating, chest pain, dysphagia with liquids, dysphagia with solids, heartburn, loss of appetite, nausea, vomiting, vomiting blood, weight loss, and  weight gain.      Reports constipation, hemorrhoids, irritable bowel syndrome, rectal bleeding, and  rectal pain.     Denies anal fissure, black tarry stools, change in bowel habit, diarrhea, diverticulosis, fecal incontinence, heme positive stool, jaundice, light color stool, and  liver problems.    Current Medications (verified): 1)  Epipen 2-Pak 0.3 Mg/0.71ml Devi (Epinephrine) .... Use As Directed 2)  Solodyn 85 Mg .Marland Kitchen.. 1 Once Daily 3)  Clarifoam .... Two Times A Day  For Skin Face 4)  Differin .... Once Daily For Skin 5)  Klonopin 1 Mg Tabs (Clonazepam) .... One By Mouth Two Times A Day As Needed Anxiety/panic 6)  Silvadene 1 % Crea (Silver Sulfadiazine) .... Use Asd To Affected Area Three Times A Day Until Healed 7)  Doxycycline Hyclate 100 Mg  Caps (Doxycycline Hyclate) .Marland Kitchen.. 1po Two Times A Day 8)  Fluconazole 150 Mg Tabs (Fluconazole) .Marland Kitchen.. 1 By Mouth Q 3 Days As Needed  Allergies (verified): No Known Drug Allergies  Past History:  Past Medical History: Reviewed history from 07/14/2008 and no changes required. MVP Mild MR Palpitations Symptomatic PVCs GERD Allergic rhinitis Depression Migraine  Past Surgical History: Reviewed history from 11/06/2007 and no changes required. 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  The patient denies allergy/sinus, anemia, anxiety-new, arthritis/joint pain, back pain, blood in urine, breast changes/lumps, change in vision, confusion, cough, coughing up blood, fainting, fatigue, fever, headaches-new, hearing problems, heart murmur, itching, menstrual pain, muscle pains/cramps, night sweats, nosebleeds, pregnancy symptoms, shortness of breath, skin rash, sleeping problems, sore throat, swelling of feet/legs, swollen lymph glands, thirst - excessive , urination - excessive , urination changes/pain, urine leakage, vision changes, and voice change.         SEE HPI  Vital Signs:  Patient profile:   35 year old female Height:      63 inches Weight:      150.2 pounds BMI:     26.70 Pulse  rate:   100 / minute Pulse rhythm:   regular BP sitting:   114 / 78  (left arm) Cuff size:   regular  Vitals Entered By: Harlow Mares CMA Duncan Dull) (January 25, 2010 2:05 PM)  Physical Exam  General:  Well developed, well nourished, no acute distress. Head:  Normocephalic and atraumatic. Eyes:  PERRLA, no icterus. Lungs:   Clear throughout to auscultation. Heart:  Regular rate and rhythm; no murmurs, rubs,  or bruits. Abdomen:  SOFT, NONTENDER, NO MASS OR HSM,BS+ Rectal:  MEDIUM EXTERNAL HEMORRHOIDS,SWOLLEN, NOT THROMBOSED, TENDER TO EXAM,STOOL,HEME NEGATIVE BUT SCANT STOOL PRESENT. Extremities:  No clubbing, cyanosis, edema or deformities noted. Neurologic:  Alert and  oriented x4;  grossly normal neurologically. Psych:  Alert and cooperative. Normal mood and affect.   Impression & Recommendations:  Problem # 1:  HEMORRHOIDS-EXTERNAL (ICD-455.3) Assessment Deteriorated 34 Y.O WITH SYMPTOMATIC EXTERNAL HEMORRHOIDS. RESTAL DISCOMFORT,INTERMITTENT RECTAL BLEEDING  START SITZ BATHS DAILY TRIAL OF ANALPRAM CREAM 2.5%-USE 3-4 X DAILYNX 2 WEEKS THEN AS NEEDED. SHE HAS HAD SXS LONG TERM,AND IS WILLING TO CONSIDER SURGERY.WILL TRY MEDICAL MANAGEMENT FIRST.  Problem # 2:  FM HX MALIGNANT NEOPLASM GASTROINTESTINAL TRACT (ICD-V16.0) Assessment: Unchanged AUNT DX AT EARLY AGE-IN HER 30'S   SCHEDULE COLONOSCOPY FOR SCREENING. PROCEDURE DISCUSSED IN DETAIL,INCLUDING ALTERNATIVES, COMPLICATIONS,PT AGREEABLE TO PROCEED. Orders: Colonoscopy (Colon)  Patient Instructions: 1)  Copy sent to : Thomas Jones,MD 2)  We are sending in your prescription to your pharmacy 3)  Sitz bath daily, brochure given 4)  We are scheduling you a colonoscopy with Dr Russella Dar 5)  The medication list was reviewed and reconciled.  All changed / newly prescribed medications were explained.  A complete medication list was provided to the patient / caregiver. Prescriptions: MOVIPREP 100 GM  SOLR (PEG-KCL-NACL-NASULF-NA ASC-C) As per prep instructions.  #1 x 0   Entered by:   Merri Ray CMA (AAMA)   Authorized by:   Sammuel Cooper PA-c   Signed by:   Merri Ray CMA (AAMA) on 01/25/2010   Method used:   Electronically to        [facility] [address] [phone]* (retail)       [address] [place], [place]  [address]        Ph: [phone]       Fax: [phone]   RxID:   [id number] ANAMANTLE HC 3-2.5 % KIT (LIDOCAINE-HYDROCORTISONE ACE) use 2-3 times daily for 14 days and then as needed  #28 x 1   Entered by:   Merri Ray CMA (AAMA)   Authorized by:   Sammuel Cooper PA-c   Signed by:   Merri Ray CMA (AAMA) on 01/25/2010   Method used:   Electronically to        [facility] [address] [phone]* (retail)       [address] [place], [place]  [address]       Ph: [phone]       Fax: [phone]   RxID:   [id number]

## 2010-08-03 NOTE — Progress Notes (Signed)
Summary: pt falling   Phone Note Call from Patient   Caller: Patient Reason for Call: Talk to Nurse Summary of Call: pt had 2 episodes of "fainting", her knees give out and she falls, last about 20 sec, does not lose consciousness-wants to know if she should see [place] or neurology-pls advise [phone] Initial call taken by: Glynda Jaeger,  March 03, 2010 11:47 AM  Follow-up for Phone Call        [facility]. Mylo Red RN  Mrs. Zollner calls today after having a dizzy episode last pm at dinner.  She said she felt like she might pass out and laid down.  The dizziness lasted 30-45 seconds. She did not pass out nor did she fall.  She wanted to know if this was a sign her mitral regurgitation was getting worse.  She did not call her PCP  She also states she had a dizzy "spell" on 02/11/10 while doing a colonoscopy prep for 8/12. She is keeping well hydrated.  I told her I would talk with Dr. [name] and call her back.  She is at work today. Mylo Red RN  Additional Follow-up for Phone Call Additional follow up Details #1::        Doubt this is related to her MR getting worse but can be seen with MVP. Happy to see in office and can double book. Additional Follow-up by: Gaylord Shih, MD, [facility],  March 03, 2010 1:38 PM     Appended Document: pt falling Pt aware and will call back if she has any further "spells".  At this time she does not want to schedule an appointment. Mylo Red RN

## 2010-08-03 NOTE — Letter (Signed)
Summary: Patient Notice-Hyperplastic Polyps  [facility]  [address] [place], [place] [address]   Phone: [phone]  Fax: [phone]        February 16, 2010 MRN: [id number]    [facility] [address] [place], [place]  [address]    Dear Ms. [name],  I am pleased to inform you that the colon polyp(s) removed during your recent colonoscopy was (were) found to be hyperplastic. These types of polyps are NOT pre-cancerous.  It is my recommendation that you have a repeat colonoscopy examination in 5 years.  Should you develop new or worsening symptoms of abdominal pain, bowel habit changes or bleeding from the rectum or bowels, please schedule an evaluation with either your primary care physician or with me.  Continue treatment plan as outlined the day of your exam.  Please call us if you are having persistent problems or have questions about your condition that have not been fully answered at this time.  Sincerely,  Meryl Dare MD [facility]  This letter has been electronically signed by your physician.  Appended Document: Patient Notice-Hyperplastic Polyps letter mailed

## 2010-08-03 NOTE — Procedures (Signed)
Summary: Colonoscopy  Patient: [name] Note: All result statuses are Final unless otherwise noted.  Tests: (1) Colonoscopy (COL)   COL Colonoscopy           DONE (C)      [facility]     [address]     [place], [place]  [address]           COLONOSCOPY PROCEDURE REPORT           PATIENT:  [name], [name]  MR#:  [id number]     BIRTHDATE:  July 07, 1975, 34 yrs. old  GENDER:  female     ENDOSCOPIST:  Judie Petit T. Russella Dar, MD, [facility]           PROCEDURE DATE:  02/12/2010     PROCEDURE:  Colonoscopy with biopsy, snare polypectomy, and inj     sclerosis of hemorrhoids     ASA CLASS:  Class II     INDICATIONS:  1) hematochezia  2) rectal pain  3) family history     of colon cancer-aunt in her 30s.     MEDICATIONS:   Fentanyl 150 mcg IV, Versed 14 mg IV, Benadryl 175     mg IV           DESCRIPTION OF PROCEDURE:   After the risks benefits and     alternatives of the procedure were thoroughly explained, informed     consent was obtained.  Digital rectal exam was performed and     revealed no abnormalities.   The LB [id number]-[id number] endoscope     was introduced through the anus and advanced to the cecum, which     was identified by both the appendix and ileocecal valve, without     limitations.  The quality of the prep was excellent, using     MoviPrep.  The instrument was then slowly withdrawn as the colon     was fully examined.     <<PROCEDUREIMAGES>>     FINDINGS:  A sessile polyp was found in the ascending colon. It     was 5 mm in size. Polyp was snared without cautery. Retrieval was     successful.  A sessile polyp was found. It was 10 mm in size.     Polyp was snared, then cauterized with monopolar cautery.     Retrieval was successful. A sessile polyp was found. It was 3 mm     in size. The polyp was removed using cold biopsy forceps.     Internal hemorrhoids were found. They were moderately sized.     Injection sclerosis of internal hemorrhoids with 2.5  cc of 23.4%     saline, injected well above the dentate line. This was otherwise a     normal examination of the colon. Retroflexed views in the rectum     revealed no other findings other than those already described. The     time to cecum =  3.5  minutes. The scope was then withdrawn (time     =  15.25  min) from the patient and the procedure completed.           COMPLICATIONS:  None           ENDOSCOPIC IMPRESSION:     1) 5 mm sessile polyp in the ascending colon     2) 10 mm sessile polyp     3) 3 mm sessile polyp     4) Internal hemorrhoids  RECOMMENDATIONS:     1) No aspirin or NSAID's for 2 weeks     2) Await pathology results     3) Repeat Colonoscopy in 3 years if the splenic flexure polyp is     adenomatous, otherwise 5 years     4) High fiber diet with liberal fluid intake.     5) Office followup visit as needed           Malcolm T. Russella Dar, MD, [facility]           CC: Etta Grandchild, MD           n.     REVISED:  02/16/2010 09:59 AM     eSIGNED:   Judie Petit T. Stark at 02/16/2010 09:59 AM           Renard Hamper, [phone]  Note: An exclamation mark (!) indicates a result that was not dispersed into the flowsheet. Document Creation Date: 02/16/2010 10:02 AM _______________________________________________________________________  (1) Order result status: Final Collection or observation date-time: 02/12/2010 14:14 Requested date-time:  Receipt date-time:  Reported date-time:  Referring Physician:   Ordering Physician: Claudette Head [phone]) Specimen Source:  Source: Launa Grill Order Number: [id number] Lab site:   Appended Document: Colonoscopy     Procedures Next Due Date:    Colonoscopy: 02/2015

## 2010-11-01 ENCOUNTER — Encounter: Payer: Self-pay | Admitting: Cardiology

## 2010-11-03 ENCOUNTER — Encounter: Payer: Self-pay | Admitting: Cardiology

## 2010-11-03 ENCOUNTER — Ambulatory Visit: Payer: Self-pay | Admitting: Cardiology

## 2010-11-03 ENCOUNTER — Ambulatory Visit (INDEPENDENT_AMBULATORY_CARE_PROVIDER_SITE_OTHER): Payer: BC Managed Care – PPO | Admitting: Cardiology

## 2010-11-03 VITALS — BP 112/70 | HR 79 | Ht 63.0 in | Wt 164.0 lb

## 2010-11-03 DIAGNOSIS — R002 Palpitations: Secondary | ICD-10-CM

## 2010-11-03 DIAGNOSIS — I059 Rheumatic mitral valve disease, unspecified: Secondary | ICD-10-CM

## 2010-11-03 NOTE — Patient Instructions (Signed)
  Your physician has requested that you have an echocardiogram. Echocardiography is a painless test that uses sound waves to create images of your heart. It provides your doctor with information about the size and shape of your heart and how well your heart's chambers and valves are working. This procedure takes approximately one hour. There are no restrictions for this procedure.  Your physician recommends that you schedule a follow-up appointment in: 1 year with Dr. [name]

## 2010-11-03 NOTE — Progress Notes (Signed)
   Patient ID: [name], female    DOB: 1976-06-10, 35 y.o.   MRN: [id number]  HPI Mrs [name] returns for E and M of her MVP. She has had increased fatique but no DOE, orthopnea, edema, or signifiicant palpitations.  Her last Echo in 2010 showed moderate MVP with mild to moderate MR.   EKG today shows is normal.    Review of Systems  All other systems reviewed and are negative.      Physical Exam  Nursing note and vitals reviewed. Constitutional: She is oriented to person, place, and time. She appears well-developed and well-nourished. No distress.  HENT:  Head: Normocephalic and atraumatic.  Eyes: EOM are normal. Pupils are equal, round, and reactive to light.  Neck: Neck supple. No JVD present. No tracheal deviation present. No thyromegaly present.  Cardiovascular: Normal rate, regular rhythm, S2 normal and normal pulses.  PMI is not displaced.   Murmur heard.  Crescendo systolic murmur is present with a grade of 2/6       Mid systolic click  Pulmonary/Chest: Effort normal and breath sounds normal.  Abdominal: Soft. Bowel sounds are normal.  Musculoskeletal: Normal range of motion. She exhibits no edema.  Neurological: She is alert and oriented to person, place, and time.  Skin: Skin is warm and dry.  Psychiatric: She has a normal mood and affect.

## 2010-11-03 NOTE — Assessment & Plan Note (Signed)
Stable clinically. Will reassess MR by Echo.

## 2010-11-16 NOTE — Assessment & Plan Note (Signed)
[facility]                            CARDIOLOGY OFFICE NOTE   NAME:[name]                    MRN:          [id number]  DATE:07/14/2008                            DOB:          Sep 04, 1975    Ms. [name] comes in today for followup.  I have not seen her since 2007.   PROBLEM LIST:  1. Mild mitral valve prolapse, last echo on January 09, 2006.  She had      mild mitral regurgitation.  She had mild prolapse of both leaflets.      There is no significant redundancy of the mitral valve leaflets.  2. Palpitations.  These continued to occur and actually have been      worsened lately.  3. History of atypical chest pain, probably related to her prolapse.  4. Early repolarization.  5. History of premature ventricular contractions and brief episodes of      supraventricular tachycardia, Holter monitor in 2007.   She is on no medications.   PHYSICAL EXAMINATION:  VITAL SIGNS:  Blood pressure 116/84.  Pulse is 74  and regular.  Her weight is 173.  GENERAL:  She is in no acute distress.  No exophthalmia.  Facial  symmetry is normal.  Dentition satisfactory.  NECK:  Supple.  Carotids are equal bilaterally without bruits.  Thyroid  is palpable, both lobes, no tenderness.  No bruit.  Normal trachea  without tracheal deviation.  LUNGS:  Clear to auscultation and percussion.  HEART:  A nondisplaced PMI.  Soft S1 and S2.  Faint murmur at the apex.  No click or gallop.  ABDOMEN:  Soft.  Good bowel sounds.  No midline bruit.  No pulsatile  mass.  EXTREMITIES:  No cyanosis, clubbing, or edema.  Pulses are brisk.  NEURO:  Intact.  SKIN:  Unremarkable.   EKG shows sinus rhythm with early repolarization.   ASSESSMENT/PLAN:  Ms. [name] is having more palpitations.  She has not  had recent blood work.  Her echo is almost 36 years old.   PLAN:  1. A 2-D echocardiogram to reassess her mitral valve prolapse.  2. Chem-7, magnesium, and TSH.  3. P.r.n.  metoprolol 25 mg p.o. q.6 h. for palpitations.   I will plan on seeing her back again in a year.     Thomas C. [name], MD, [facility]  Electronically Signed    TCW/MedQ  DD: 07/14/2008  DT: 07/15/2008  Job #: [id number]

## 2010-11-19 ENCOUNTER — Ambulatory Visit (HOSPITAL_COMMUNITY): Payer: BC Managed Care – PPO | Attending: Internal Medicine | Admitting: Radiology

## 2010-11-19 DIAGNOSIS — R0989 Other specified symptoms and signs involving the circulatory and respiratory systems: Secondary | ICD-10-CM | POA: Insufficient documentation

## 2010-11-19 DIAGNOSIS — I319 Disease of pericardium, unspecified: Secondary | ICD-10-CM | POA: Insufficient documentation

## 2010-11-19 DIAGNOSIS — I379 Nonrheumatic pulmonary valve disorder, unspecified: Secondary | ICD-10-CM | POA: Insufficient documentation

## 2010-11-19 DIAGNOSIS — I079 Rheumatic tricuspid valve disease, unspecified: Secondary | ICD-10-CM | POA: Insufficient documentation

## 2010-11-19 DIAGNOSIS — R0609 Other forms of dyspnea: Secondary | ICD-10-CM | POA: Insufficient documentation

## 2010-11-19 DIAGNOSIS — I059 Rheumatic mitral valve disease, unspecified: Secondary | ICD-10-CM | POA: Insufficient documentation

## 2010-11-19 NOTE — Assessment & Plan Note (Signed)
[facility]                              CARDIOLOGY OFFICE NOTE   NAME:[name], MITCHELL EPLING                    MRN:          [id number]  DATE:01/12/2006                            DOB:          Mar 10, 1976    HISTORY:  [name] is a 35 year old single black female who is referred by  Dr. Oliver Barre for palpitations over the last 2 months.  These occur  spontaneously.  They are not associated with exercise.  She has not had  sustained episodes of these and has not had any presyncope, syncope or chest  pain.   She has never had these before.   Recent blood work showed a normal potassium, CBC and a TSH.   A 2D echo showed mitral valve prolapse which was described as mild with mild  mitral regurgitation.  She had prolapse of both leaflets.  There was no  significant redundancy of the mitral leaflets.   Holter monitor demonstrated PVCs that were usually isolated.  She had two  couplets.  She had a very minimal supraventricular events.   PAST MEDICAL HISTORY:  She had no previous cardiac history.   ALLERGIES:  NO KNOWN DRUG ALLERGIES.   HABITS:  She used to smoke but quit in February 2007.  She has not used  recreational drugs.  She has about one alcoholic beverage per month.  She  has one caffeinated beverage a day.  She does not use stimulants or  decongestants.   PAST SURGICAL HISTORY:  She has had a tubal ligation and wisdom teeth  extracted.   FAMILY HISTORY:  Noncontributory.   MEDICATIONS:  She is not on any medications.   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  She has two children that are young.   REVIEW OF SYSTEMS:  Negative times multiple systems reviewed.   PHYSICAL EXAMINATION:  GENERAL:  She is in no acute distress.  She is very  pleasant.  VITAL SIGNS:  Blood pressure 122/76, pulse 92 and regular.  Her weight is  174.  She is 5 feet 3 inches.  HEENT: Normocephalic,  atraumatic.  PERRLA.  Extraocular movements intact.  Facial symmetry is normal.  Dentition is satisfactory.  NECK:  Carotid upstrokes are equal bilaterally without bruits.  No JVD.  Thyroid is not enlarged.  Trachea is midline.  LUNGS:  Clear.  HEART:  Reveals a regular rate and rhythm with a soft mid systolic murmur.  No obvious click was heard.  ABDOMEN:  Exam is soft with good bowel sounds.  There is no midline bruit.  There is no hepatomegaly.  EXTREMITIES:  Without clubbing, cyanosis or edema.  Pulses are intact.   Electrocardiogram shows sinus rhythm with an occasional PVC.  Intervals are  normal.   ASSESSMENT:  Bileaflet mitral valve prolapse with mild mitral regurgitation.  She is having palpitations which are related to PVCs.  These are non-  complex.   RECOMMENDATIONS:  1.  Reassurance.  2.  Warned if she has a sustained event or has a syncopal or presyncopal  event to let me know.  3.  No SBE prophylaxis recommended by most recent guidelines.  4.  Follow up with [place] every 2 years.                               Thomas C. [name], MD, [facility]    TCW/MedQ  DD:  01/12/2006  DT:  01/12/2006  Job #:  [id number]   cc:   Corwin Levins, MD

## 2010-11-19 NOTE — Assessment & Plan Note (Signed)
[facility]                           GASTROENTEROLOGY OFFICE NOTE   NAME:[name]                    MRN:          [id number]  DATE:03/22/2006                            DOB:          August 22, 1975    [name] is a 35 year old African-American female that I have previously  evaluated who returns today with complaints of postprandial and nighttime  heartburn and regurgitation. She notes a slightly heavy sensation in her  chest as well following meals and at night and she has noted a funny taste  in her mouth. She has taken Tums on a p.r.n. basis with only partial relief  of symptoms.  Her symptoms began about two to three months ago and have  gradually worsened.  She has a history of chronic constipation and I  previously saw her on June 02, 2004, please see that note. She has no  abdominal pain, change in bowel habits, dysphagia, or odynophagia.   MEDICATIONS:  Correctol p.r.n., Tums p.r.n.   MEDICATION ALLERGIES:  None known.   EXAM:  VITAL SIGNS:  No acute distress.  Weight 178 pounds, blood pressure  is 122/82, pulse 84 and regular.  HEENT:  Anicteric sclerae.  Oropharynx clear.  CHEST: Clear to auscultation bilaterally.  CARDIAC: Regular rate rhythm without murmurs appreciated.  ABDOMEN: Soft and nontender with normal active bowel sounds, no palpable  organomegaly, masses or hernias.   ASSESSMENT AND PLAN:  Presumed gastroesophageal reflux disease. Trial of  standard anti-reflux measures with foraged bed blocks. Begin Prevacid 30 mg  p.o. q.a.m. Return office visit in four to five weeks. If her symptoms have  not substantially improved, proceed with upper endoscopy for further  evaluation.                                   Venita Lick. Russella Dar, MD, [facility]   MTS/MedQ  DD:  03/22/2006  DT:  03/24/2006  Job #:  [id number]

## 2010-11-19 NOTE — Assessment & Plan Note (Signed)
[facility]                            CARDIOLOGY OFFICE NOTE   NAME:[name], [name] HAMOR                    MRN:          [id number]  DATE:08/11/2006                            DOB:          August 31, 1975    Ms. [name] comes in today per the request of the emergency department at  [facility].   She had well-localized sharp, stabbing pain under her left breast.  She  went to [facility] who obtained an EKG which showed a PVC and some  questionable early repolarization.  She was referred over to the ED.  In  the ED she had negative blood work, a normal EKG, as well as a negative  D-dimer.  She said they gave her a shot and she has been better ever  since.   She denies any fever, chills, sweats, upper respiratory tract infection,  hemoptysis, hematemesis.   Her blood pressure today is 131/89, her pulse is 80 and regular, her  weight is 174.  HEENT normocephalic, atraumatic, PERRLA, extraocular  movements intact, sclerae are clear.  Neck is supple.  There is no JVD,  thyroid is not enlarged, trachea is midline.  Lungs are clear to  auscultation and percussion.  There is no rub.  Heart exam reveals a  regular rate and rhythm without rub or gallop.  Abdominal exam is soft  with good bowel sounds.  Extremities reveal no edema.  There is no sign  of DVT.  Pulses are present.  Neurologic exam is intact.   Reviewing her chart, we have seen her in the past for mitral valve  prolapse.  She has got bileaflet prolapse with mild mitral  regurgitation.  She has had palpitations.  A Holter monitor in the past,  i.e. July 2006 showed some PVCs.   ASSESSMENT:  I think she is doing well.  I have reassured her that this  is not pericarditis or any serious heart problem that I can tell.  There  is no sign of a pulmonary embolus based on a negative D-dimer.   PLAN:  1. Reassurance.  2. No change in her medical program.  3. Follow up with [place] in a year or  p.r.n.     Thomas C. [name], MD, [facility]  Electronically Signed    TCW/MedQ  DD: 08/11/2006  DT: 08/11/2006  Job #: [id number]

## 2010-11-24 ENCOUNTER — Telehealth: Payer: Self-pay | Admitting: Cardiology

## 2010-11-24 NOTE — Telephone Encounter (Signed)
Would like result  Of Echo done on 5-18.  Please call her with this.

## 2010-11-25 NOTE — Telephone Encounter (Signed)
Pt calling back re results of echo-pls call [phone]

## 2010-11-25 NOTE — Telephone Encounter (Signed)
Patient called for 2-D echo results. RN gave pt general information on the results. Pt. is aware that results have not been reviewed to make recommendations if needed by Dr. [name]. We will call her back when he does. Patient verbalized understanding.

## 2010-11-26 NOTE — Telephone Encounter (Signed)
Pt is aware of echo results Debbie Lillybeth Tal RN  

## 2011-04-29 ENCOUNTER — Ambulatory Visit (INDEPENDENT_AMBULATORY_CARE_PROVIDER_SITE_OTHER): Payer: BC Managed Care – PPO | Admitting: Internal Medicine

## 2011-04-29 ENCOUNTER — Other Ambulatory Visit: Payer: Self-pay | Admitting: Internal Medicine

## 2011-04-29 ENCOUNTER — Encounter: Payer: Self-pay | Admitting: Internal Medicine

## 2011-04-29 ENCOUNTER — Ambulatory Visit (INDEPENDENT_AMBULATORY_CARE_PROVIDER_SITE_OTHER)
Admission: RE | Admit: 2011-04-29 | Discharge: 2011-04-29 | Disposition: A | Discharge status: Home / Self Care | Payer: BC Managed Care – PPO | Source: Ambulatory Visit | Attending: Internal Medicine | Admitting: Internal Medicine

## 2011-04-29 VITALS — BP 110/70 | HR 80 | Temp 98.7°F | Resp 16 | Wt 164.0 lb

## 2011-04-29 DIAGNOSIS — M25559 Pain in unspecified hip: Secondary | ICD-10-CM

## 2011-04-29 DIAGNOSIS — M25552 Pain in left hip: Secondary | ICD-10-CM

## 2011-04-29 MED ORDER — NAPROXEN SODIUM ER 500 MG PO TB24: 500.0000 mg | ORAL_TABLET | Freq: Every day | ORAL | Status: DC

## 2011-04-29 NOTE — Progress Notes (Signed)
Subjective:    Patient ID: [name], female    DOB: 1976-05-24, 35 y.o.   MRN: [id number]  HPI She complains of left hip pain for about 7 months. Se he denies any injury or trauma. The pain is constant and is worsened at night when she lays on her left side but also hurts some with movement or weight-bearing activity. She tells me that she saw [name] about this 2 months ago but she is concerned that he did not do an xray. She has not taken any meds for pain.   Review of Systems  Constitutional: Negative.   HENT: Negative.   Eyes: Negative.   Respiratory: Negative.   Cardiovascular: Negative.   Gastrointestinal: Negative.   Genitourinary: Negative.   Musculoskeletal: Positive for arthralgias (left hip). Negative for myalgias, back pain, joint swelling and gait problem.  Skin: Negative.   Neurological: Negative.   Hematological: Negative.   Psychiatric/Behavioral: Negative.        Objective:   Physical Exam  Vitals reviewed. Constitutional: She appears well-developed and well-nourished. No distress.  HENT:  Head: Normocephalic and atraumatic.  Mouth/Throat: Oropharynx is clear and moist. No oropharyngeal exudate.  Eyes: Conjunctivae are normal. Right eye exhibits no discharge. Left eye exhibits no discharge. No scleral icterus.  Neck: Normal range of motion. Neck supple. No JVD present. No tracheal deviation present. No thyromegaly present.  Cardiovascular: Normal rate, regular rhythm, normal heart sounds and intact distal pulses.  Exam reveals no gallop and no friction rub.   No murmur heard. Pulmonary/Chest: Effort normal and breath sounds normal. No stridor. No respiratory distress. She has no wheezes. She has no rales. She exhibits no tenderness.  Abdominal: Soft. Bowel sounds are normal. She exhibits no distension and no mass. There is no tenderness. There is no rebound and no guarding.  Musculoskeletal: Normal range of motion.       Left hip: She exhibits bony  tenderness (over the GT). She exhibits normal range of motion, normal strength, no tenderness, no swelling, no crepitus, no deformity and no laceration.       Lumbar back: Normal. She exhibits normal range of motion, no tenderness, no bony tenderness, no swelling, no edema, no deformity, no laceration, no pain, no spasm and normal pulse.  Lymphadenopathy:    She has no cervical adenopathy.  Neurological: She is alert. She has normal strength. She displays no atrophy, no tremor and normal reflexes. No cranial nerve deficit or sensory deficit. She exhibits normal muscle tone. She displays a negative Romberg sign. She displays no seizure activity. Coordination and gait normal. She displays no Babinski's sign on the right side. She displays no Babinski's sign on the left side.  Reflex Scores:      Tricep reflexes are 1+ on the right side and 1+ on the left side.      Bicep reflexes are 1+ on the right side and 1+ on the left side.      Brachioradialis reflexes are 1+ on the right side and 1+ on the left side.      Patellar reflexes are 1+ on the right side and 1+ on the left side.      Achilles reflexes are 1+ on the right side and 1+ on the left side. Skin: Skin is warm and dry. No rash noted. She is not diaphoretic. No erythema. No pallor.  Psychiatric: She has a normal mood and affect. Her behavior is normal. Judgment and thought content normal.  Assessment & Plan:

## 2011-04-29 NOTE — Assessment & Plan Note (Signed)
I will check a plain xray to look for DJD, fracture, etc - I think she has bursitis in the GT so I have started her on naprelan and gave her pt ed material about GT, I will see how she does over the next few weeks and if she does not improve I will consider referral to PT or pain mangt (? The need for steroid injection)

## 2011-04-29 NOTE — Patient Instructions (Signed)

## 2011-05-02 ENCOUNTER — Telehealth: Payer: Self-pay | Admitting: *Deleted

## 2011-05-02 NOTE — Telephone Encounter (Signed)
Request results of Xray. Per VO Dr Yetta Barre, pt informed Normal results; given copy of report.

## 2011-07-18 ENCOUNTER — Ambulatory Visit (INDEPENDENT_AMBULATORY_CARE_PROVIDER_SITE_OTHER): Payer: PRIVATE HEALTH INSURANCE | Admitting: Gastroenterology

## 2011-07-18 ENCOUNTER — Other Ambulatory Visit (INDEPENDENT_AMBULATORY_CARE_PROVIDER_SITE_OTHER): Payer: PRIVATE HEALTH INSURANCE

## 2011-07-18 ENCOUNTER — Encounter: Payer: Self-pay | Admitting: Gastroenterology

## 2011-07-18 VITALS — BP 112/68 | HR 80 | Ht 63.0 in | Wt 157.0 lb

## 2011-07-18 DIAGNOSIS — R079 Chest pain, unspecified: Secondary | ICD-10-CM

## 2011-07-18 DIAGNOSIS — R109 Unspecified abdominal pain: Secondary | ICD-10-CM

## 2011-07-18 LAB — COMPREHENSIVE METABOLIC PANEL
AST: 16 U/L (ref 0–37)
Albumin: 4.2 g/dL (ref 3.5–5.2)
Alkaline Phosphatase: 60 U/L (ref 39–117)
BUN: 8 mg/dL (ref 6–23)
Calcium: 8.8 mg/dL (ref 8.4–10.5)
Chloride: 102 mEq/L (ref 96–112)
Creatinine, Ser: 0.9 mg/dL (ref 0.4–1.2)
Glucose, Bld: 94 mg/dL (ref 70–99)
Total Bilirubin: 0.3 mg/dL (ref 0.3–1.2)

## 2011-07-18 LAB — CBC WITH DIFFERENTIAL/PLATELET
Basophils Relative: 0.3 % (ref 0.0–3.0)
Hemoglobin: 13.5 g/dL (ref 12.0–15.0)
MCHC: 33.6 g/dL (ref 30.0–36.0)
MCV: 88.1 fl (ref 78.0–100.0)
Monocytes Absolute: 0.4 10*3/uL (ref 0.1–1.0)
Monocytes Relative: 5.5 % (ref 3.0–12.0)
Neutro Abs: 5.3 10*3/uL (ref 1.4–7.7)
Neutrophils Relative %: 75.8 % (ref 43.0–77.0)
Platelets: 260 10*3/uL (ref 150.0–400.0)
RBC: 4.55 Mil/uL (ref 3.87–5.11)

## 2011-07-18 NOTE — Patient Instructions (Signed)
Go directly to the basement to have your labs drawn today. You have been scheduled for a Abdominal Ultrasound at [facility] [place]. Location) on 07/20/11 at 9:30am. Nothing to eat or drink after midnight.  Take Tylenol or Advil for pain relief.  cc: [name], MD

## 2011-07-18 NOTE — Progress Notes (Signed)
History of Present Illness: This is a 36 year old female who relates a several month history of pain along her lower chest, costal margin and upper abdomen. She states the symptoms are there constantly and worsen with bending. Symptoms do not appear to change with any digestive function. She has a long history of alternating diarrhea and constipation and this bowel pattern has not changed. She underwent colonoscopy in August 2011 showing hyperplastic colon polyps. She has a prior history of GERD recently states she only had a minor episode of heartburn about once each week. Denies weight loss, constipation, diarrhea, change in stool caliber, melena, hematochezia, nausea, vomiting, dysphagia.  Current Medications, Allergies, Past Medical History, Past Surgical History, Family History and Social History were reviewed in [employer] record.  Physical Exam: General: Well developed , well nourished, no acute distress Head: Normocephalic and atraumatic Eyes:  sclerae anicteric, EOMI Ears: Normal auditory acuity Mouth: No deformity or lesions Lungs: Clear throughout to auscultation. Mild chest wall tenderness along the bilateral costal margins-anteriorly and laterally Heart: Regular rate and rhythm; no murmurs, rubs or bruits Abdomen: Soft, mild tenderness in the upper abdomen and bilateral flanks along the costal margin without rebound or guarding and non distended. No masses, hepatosplenomegaly or hernias noted. Normal Bowel sounds Musculoskeletal: Symmetrical with no gross deformities  Pulses:  Normal pulses noted Extremities: No clubbing, cyanosis, edema or deformities noted Neurological: Alert oriented x 4, grossly nonfocal Psychological:  Alert and cooperative. Normal mood and affect  Assessment and Recommendations:  1. Costochondral area pain. Suspect musculoskeletal. Obtain standard blood work and abdominal ultrasound. Trial of Tylenol or Advil.  2. Irritable bowel  syndrome. Does not appear to be related to her ongoing pain that is likely the cause of her alternating diarrhea and constipation. Increase dietary fiber and water intake. Consider the use of a daily probiotic.  3. Family history of colon cancer. High-risk screening colonoscopy with propofol recommended in August 2016.

## 2011-07-20 ENCOUNTER — Ambulatory Visit
Admission: RE | Admit: 2011-07-20 | Discharge: 2011-07-20 | Disposition: A | Discharge status: Home / Self Care | Payer: PRIVATE HEALTH INSURANCE | Source: Ambulatory Visit | Attending: Gastroenterology | Admitting: Gastroenterology

## 2011-07-20 ENCOUNTER — Telehealth: Payer: Self-pay | Admitting: Gastroenterology

## 2011-07-20 DIAGNOSIS — R109 Unspecified abdominal pain: Secondary | ICD-10-CM

## 2011-07-20 NOTE — Telephone Encounter (Signed)
Patient advised of the [place] results.

## 2011-08-11 ENCOUNTER — Telehealth: Payer: Self-pay | Admitting: *Deleted

## 2011-08-11 NOTE — Telephone Encounter (Signed)
She needs to be seen.

## 2011-08-11 NOTE — Telephone Encounter (Signed)
Left msg on vm requesting rx for diflucan sent to her pharmacy... 08/11/11@4 :01pm/LMB

## 2011-08-11 NOTE — Telephone Encounter (Signed)
Notified pt with md response pt states she will try ovc products first will call back if don't clear up... 08/11/11@4 :26pm/LMB

## 2011-08-29 ENCOUNTER — Ambulatory Visit: Payer: PRIVATE HEALTH INSURANCE | Admitting: Gastroenterology

## 2011-09-20 ENCOUNTER — Telehealth: Payer: Self-pay | Admitting: Cardiology

## 2011-09-20 NOTE — Telephone Encounter (Signed)
09/20/11--pt calling wanting to be seen today rather than tomorrow(pt has an appoint scheduled from last visit) due to SOB--advised to keep tomorrow's appoint. As pt is at work and does not sound like she is in any distress and quickly agreed that she could wait until 3/20--also advised if becomes worse she can go to nearest ED--PT [facility]--NT

## 2011-09-20 NOTE — Telephone Encounter (Signed)
Pt having some sob and a little chest discomfort and wants to move appt to today

## 2011-09-21 ENCOUNTER — Ambulatory Visit (INDEPENDENT_AMBULATORY_CARE_PROVIDER_SITE_OTHER): Payer: BC Managed Care – PPO | Admitting: Physician Assistant

## 2011-09-21 ENCOUNTER — Encounter: Payer: Self-pay | Admitting: Physician Assistant

## 2011-09-21 DIAGNOSIS — R000 Tachycardia, unspecified: Secondary | ICD-10-CM

## 2011-09-21 DIAGNOSIS — R0989 Other specified symptoms and signs involving the circulatory and respiratory systems: Secondary | ICD-10-CM

## 2011-09-21 DIAGNOSIS — I059 Rheumatic mitral valve disease, unspecified: Secondary | ICD-10-CM

## 2011-09-21 DIAGNOSIS — R002 Palpitations: Secondary | ICD-10-CM

## 2011-09-21 LAB — CBC WITH DIFFERENTIAL/PLATELET
Basophils Relative: 0.3 % (ref 0.0–3.0)
Eosinophils Relative: 1 % (ref 0.0–5.0)
Hemoglobin: 13.6 g/dL (ref 12.0–15.0)
Lymphocytes Relative: 21.5 % (ref 12.0–46.0)
Monocytes Relative: 4.4 % (ref 3.0–12.0)
Neutro Abs: 4.7 10*3/uL (ref 1.4–7.7)
RBC: 4.61 Mil/uL (ref 3.87–5.11)

## 2011-09-21 LAB — BASIC METABOLIC PANEL
BUN: 8 mg/dL (ref 6–23)
CO2: 26 mEq/L (ref 19–32)
Chloride: 105 mEq/L (ref 96–112)
Creatinine, Ser: 0.8 mg/dL (ref 0.4–1.2)

## 2011-09-21 MED ORDER — METOPROLOL TARTRATE 25 MG PO TABS: 25.0000 mg | ORAL_TABLET | Freq: Two times a day (BID) | ORAL | Status: DC

## 2011-09-21 NOTE — Assessment & Plan Note (Signed)
Patient has history of mitral valve prolapse with mild to moderate MR. We will repeat echo with her current symptoms
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
## 2011-09-21 NOTE — Assessment & Plan Note (Signed)
Patient is having 3 week history of palpitations and rapid heart beats. We will place and Holter monitor on her. She is tachycardic at rest in the office today. I will as Lopressor 25 mg b.i.d. We will also check labs on her including a TSH, electrolytes, and CBC

## 2011-09-21 NOTE — Patient Instructions (Addendum)
Your physician has recommended you make the following change in your medication: START metoprolol 25 mg, take 1 tablet two times daily   Your physician has requested that you have an echocardiogram. Echocardiography is a painless test that uses sound waves to create images of your heart. It provides your doctor with information about the size and shape of your heart and how well your heart's chambers and valves are working. This procedure takes approximately one hour. There are no restrictions for this procedure.   Your physician has recommended that you wear a 24 hour holter monitor. Holter monitors are medical devices that record the heart's electrical activity. Doctors most often use these monitors to diagnose arrhythmias. Arrhythmias are problems with the speed or rhythm of the heartbeat. The monitor is a small, portable device. You can wear one while you do your normal daily activities. This is usually used to diagnose what is causing palpitations/syncope (passing out).   Your physician recommends that you schedule a follow-up appointment in: 3-4 weeks with Dr. Lahoma Rocker have blood work done today:  BMP, CBC, and TSH--785.00

## 2011-09-23 ENCOUNTER — Telehealth: Payer: Self-pay | Admitting: Cardiology

## 2011-09-23 NOTE — Telephone Encounter (Signed)
Pt aware of lab results.  She knows holter monitor results are not ready yet and will check on this next Wednesday when she comes in for ECHO Mylo Red RN

## 2011-09-23 NOTE — Telephone Encounter (Signed)
New msg Pt was calling about test results 

## 2011-09-26 ENCOUNTER — Telehealth: Payer: Self-pay | Admitting: Cardiology

## 2011-09-26 NOTE — Telephone Encounter (Signed)
Pt had a 24 hr holter with ecardio, report printed, informed pt dr wall/nurse will call her with the results tomorrow 3/26. Will forward to dr/nurse. Pt agreed to plan.

## 2011-09-26 NOTE — Telephone Encounter (Signed)
LMTCB, no results seen, called Marcos Eke and left msg to assist me in retrieving results.

## 2011-09-26 NOTE — Telephone Encounter (Signed)
Pt rtn call from triage nurse waiting on rtn call

## 2011-09-26 NOTE — Telephone Encounter (Signed)
Pt would like results of holter ready

## 2011-09-26 NOTE — Telephone Encounter (Signed)
LMTCB

## 2011-09-26 NOTE — Progress Notes (Signed)
Addended by: Judithe Modest D on: 09/26/2011 05:08 PM   Modules accepted: Orders

## 2011-09-26 NOTE — Progress Notes (Signed)
Addended by: Judithe Modest D on: 09/26/2011 05:19 PM   Modules accepted: Orders

## 2011-09-27 ENCOUNTER — Other Ambulatory Visit: Payer: Self-pay

## 2011-09-27 ENCOUNTER — Encounter (HOSPITAL_COMMUNITY): Payer: Self-pay | Admitting: *Deleted

## 2011-09-27 ENCOUNTER — Emergency Department (HOSPITAL_COMMUNITY): Payer: BC Managed Care – PPO

## 2011-09-27 ENCOUNTER — Emergency Department (HOSPITAL_COMMUNITY)
Admission: EM | Admit: 2011-09-27 | Discharge: 2011-09-27 | Disposition: A | Discharge status: Home / Self Care | Payer: BC Managed Care – PPO | Attending: Emergency Medicine | Admitting: Emergency Medicine

## 2011-09-27 DIAGNOSIS — R42 Dizziness and giddiness: Secondary | ICD-10-CM | POA: Insufficient documentation

## 2011-09-27 DIAGNOSIS — R002 Palpitations: Secondary | ICD-10-CM

## 2011-09-27 DIAGNOSIS — K219 Gastro-esophageal reflux disease without esophagitis: Secondary | ICD-10-CM | POA: Insufficient documentation

## 2011-09-27 DIAGNOSIS — R079 Chest pain, unspecified: Secondary | ICD-10-CM | POA: Insufficient documentation

## 2011-09-27 DIAGNOSIS — F329 Major depressive disorder, single episode, unspecified: Secondary | ICD-10-CM | POA: Insufficient documentation

## 2011-09-27 DIAGNOSIS — R0602 Shortness of breath: Secondary | ICD-10-CM | POA: Insufficient documentation

## 2011-09-27 DIAGNOSIS — Z79899 Other long term (current) drug therapy: Secondary | ICD-10-CM | POA: Insufficient documentation

## 2011-09-27 DIAGNOSIS — F3289 Other specified depressive episodes: Secondary | ICD-10-CM | POA: Insufficient documentation

## 2011-09-27 HISTORY — DX: Other ill-defined heart diseases: I51.89

## 2011-09-27 LAB — CBC
HCT: 41.1 % (ref 36.0–46.0)
MCH: 29.1 pg (ref 26.0–34.0)
MCHC: 33.6 g/dL (ref 30.0–36.0)
MCV: 86.7 fL (ref 78.0–100.0)
Platelets: 303 10*3/uL (ref 150–400)
RDW: 13.1 % (ref 11.5–15.5)
WBC: 6.5 10*3/uL (ref 4.0–10.5)

## 2011-09-27 LAB — BASIC METABOLIC PANEL
Calcium: 9.4 mg/dL (ref 8.4–10.5)
Creatinine, Ser: 0.81 mg/dL (ref 0.50–1.10)
GFR calc non Af Amer: >90 mL/min (ref >90)
Sodium: 139 mEq/L (ref 135–145)

## 2011-09-27 LAB — TROPONIN I: Troponin I: <0.3 ng/mL (ref <0.30)

## 2011-09-27 LAB — D-DIMER, QUANTITATIVE: D-Dimer, Quant: 0.28 ug/mL-FEU (ref 0.00–0.48)

## 2011-09-27 LAB — DIFFERENTIAL
Basophils Absolute: 0 10*3/uL (ref 0.0–0.1)
Eosinophils Absolute: 0.1 10*3/uL (ref 0.0–0.7)
Eosinophils Relative: 1 % (ref 0–5)
Monocytes Absolute: 0.4 10*3/uL (ref 0.1–1.0)

## 2011-09-27 MED ORDER — ASPIRIN 81 MG PO CHEW
324.0000 mg | CHEWABLE_TABLET | Freq: Once | ORAL | Status: AC
  Administered 2011-09-27: 324 mg via ORAL
  Filled 2011-09-27: qty 4

## 2011-09-27 NOTE — Telephone Encounter (Signed)
F/U- please return call to patient   Please was told to call back if she had not heard from nurse regarding holter monitor result by 3pm today.  Patient can be reached on cell#    [phone]

## 2011-09-27 NOTE — ED Notes (Signed)
Patient reports she has discomfort under her diaphragm.  She states she has felt tachycardia and sob as well.  She states she feels full.  Patient was seen by her cardiologist last week.  She had worn cardiac monitor for a while.  She was just dx with diastolic dysfunction, grade II.  Patient states she has been feeling light headed today as well.

## 2011-09-27 NOTE — Discharge Instructions (Signed)
Palpitations  A palpitation is the feeling that your heartbeat is irregular or is faster than normal. Although this is frightening, it usually is not serious. Palpitations may be caused by excesses of smoking, caffeine, or alcohol. They are also brought on by stress and anxiety. Sometimes, they are caused by heart disease. Unless otherwise noted, your caregiver did not find any signs of serious illness at this time. HOME CARE INSTRUCTIONS  To help prevent palpitations:  Drink decaffeinated coffee, tea, and soda pop. Avoid chocolate.   If you smoke or drink alcohol, quit or cut down as much as possible.   Reduce your stress or anxiety level. Biofeedback, yoga, or meditation will help you relax. Physical activity such as swimming, jogging, or walking also may be helpful.  SEEK MEDICAL CARE IF:   You continue to have a fast heartbeat.   Your palpitations occur more often.  SEEK IMMEDIATE MEDICAL CARE IF: You develop chest pain, shortness of breath, severe headache, dizziness, or fainting. Document Released: 06/17/2000 Document Revised: 06/09/2011 Document Reviewed: 08/17/2007 Exit[employer] Patient Information 2012 [employer]. 

## 2011-09-27 NOTE — Telephone Encounter (Signed)
Pt notified of monitor results 
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
## 2011-09-27 NOTE — Telephone Encounter (Signed)
Holter monitor shows only sinus tach and 3 ventricular couplets. Lab work is normal. Reassurance and continue low-dose Lopressor.

## 2011-09-27 NOTE — ED Notes (Signed)
Pt presents with onset of "fullness" to epigastric area that began last night.  Pt reports palpitations (took her own pulse which was 117), pt denies any discomfort to her chest. -shortness of breath.  Pt reports pain worsens to RUQ.  Pt reports pain worsens when she lays supine, denies any reflux symptoms.

## 2011-09-28 ENCOUNTER — Telehealth: Payer: Self-pay | Admitting: Cardiology

## 2011-09-28 ENCOUNTER — Ambulatory Visit (HOSPITAL_COMMUNITY): Payer: BC Managed Care – PPO | Attending: Cardiology

## 2011-09-28 ENCOUNTER — Other Ambulatory Visit: Payer: Self-pay

## 2011-09-28 DIAGNOSIS — R000 Tachycardia, unspecified: Secondary | ICD-10-CM

## 2011-09-28 DIAGNOSIS — R0989 Other specified symptoms and signs involving the circulatory and respiratory systems: Secondary | ICD-10-CM | POA: Insufficient documentation

## 2011-09-28 DIAGNOSIS — R002 Palpitations: Secondary | ICD-10-CM

## 2011-09-28 DIAGNOSIS — R5383 Other fatigue: Secondary | ICD-10-CM | POA: Insufficient documentation

## 2011-09-28 DIAGNOSIS — I059 Rheumatic mitral valve disease, unspecified: Secondary | ICD-10-CM

## 2011-09-28 DIAGNOSIS — R5381 Other malaise: Secondary | ICD-10-CM | POA: Insufficient documentation

## 2011-09-28 DIAGNOSIS — Z8679 Personal history of other diseases of the circulatory system: Secondary | ICD-10-CM | POA: Insufficient documentation

## 2011-09-28 DIAGNOSIS — Z87891 Personal history of nicotine dependence: Secondary | ICD-10-CM | POA: Insufficient documentation

## 2011-09-28 DIAGNOSIS — R0609 Other forms of dyspnea: Secondary | ICD-10-CM | POA: Insufficient documentation

## 2011-09-28 NOTE — Telephone Encounter (Signed)
Pt would like a referral for [facility] to discuss her mitral valve condition and "what I am looking at in the long run" She is aware her ECHO done today will need to be reviewed by Dr. [name].  I will forward to Dr. [name]. Mylo Red RN

## 2011-09-28 NOTE — Telephone Encounter (Signed)
Please return call to patient at [phone]  Patient would like to discuss referral to [facility]. Patient can be reached at hm# [phone]

## 2011-09-30 NOTE — Telephone Encounter (Signed)
Refer to Dr. Owen.   

## 2011-10-03 ENCOUNTER — Telehealth: Payer: Self-pay | Admitting: Cardiology

## 2011-10-03 DIAGNOSIS — I059 Rheumatic mitral valve disease, unspecified: Secondary | ICD-10-CM

## 2011-10-03 NOTE — Telephone Encounter (Signed)
Pt wants results of echo, and referral to cardiac surgical center, pls call [phone]

## 2011-10-03 NOTE — Telephone Encounter (Signed)
Spoke with pt, aware of echo results. Will get a referral for her to see dr Cornelius Moras to discuss MVR. She is aware of her appt with dr wall next week

## 2011-10-06 ENCOUNTER — Institutional Professional Consult (permissible substitution) (INDEPENDENT_AMBULATORY_CARE_PROVIDER_SITE_OTHER): Payer: BC Managed Care – PPO | Admitting: Thoracic Surgery (Cardiothoracic Vascular Surgery)

## 2011-10-06 ENCOUNTER — Telehealth: Payer: Self-pay | Admitting: *Deleted

## 2011-10-06 ENCOUNTER — Encounter: Payer: Self-pay | Admitting: Thoracic Surgery (Cardiothoracic Vascular Surgery)

## 2011-10-06 VITALS — BP 145/90 | HR 120 | Resp 20 | Ht 63.0 in | Wt 154.0 lb

## 2011-10-06 DIAGNOSIS — I340 Nonrheumatic mitral (valve) insufficiency: Secondary | ICD-10-CM | POA: Insufficient documentation

## 2011-10-06 DIAGNOSIS — I059 Rheumatic mitral valve disease, unspecified: Secondary | ICD-10-CM

## 2011-10-06 HISTORY — DX: Nonrheumatic mitral (valve) insufficiency: I34.0
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
## 2011-10-06 NOTE — Telephone Encounter (Signed)
Ms. [name] was seen by Dr. Barry Dienes as a new patient for MVP. Per Dawn, Dr. Barry Dienes would like for the patient to be scheduled for TEE. Please review Dr. Barry Dienes office noted dated for 10/06/11. Patient has an appointment with Dr. [name] 10/12/11 @ 9:15

## 2011-10-07 NOTE — Telephone Encounter (Signed)
TEE scheduled for 10/11/11 at 1:00pm.  Pt was notified.

## 2011-10-11 ENCOUNTER — Encounter (HOSPITAL_COMMUNITY)
Admission: RE | Disposition: A | Discharge status: Home / Self Care | Payer: Self-pay | Source: Ambulatory Visit | Attending: Cardiovascular Disease

## 2011-10-11 ENCOUNTER — Ambulatory Visit (HOSPITAL_COMMUNITY)
Admission: RE | Admit: 2011-10-11 | Discharge: 2011-10-11 | Disposition: A | Discharge status: Home / Self Care | Payer: BC Managed Care – PPO | Source: Ambulatory Visit | Attending: Cardiovascular Disease | Admitting: Cardiovascular Disease

## 2011-10-11 ENCOUNTER — Encounter (HOSPITAL_COMMUNITY): Payer: Self-pay

## 2011-10-11 DIAGNOSIS — I059 Rheumatic mitral valve disease, unspecified: Secondary | ICD-10-CM | POA: Insufficient documentation

## 2011-10-11 DIAGNOSIS — I340 Nonrheumatic mitral (valve) insufficiency: Secondary | ICD-10-CM

## 2011-10-11 HISTORY — PX: TEE WITHOUT CARDIOVERSION: SHX5443

## 2011-10-11 SURGERY — ECHOCARDIOGRAM, TRANSESOPHAGEAL
Anesthesia: Moderate Sedation

## 2011-10-11 MED ORDER — DIPHENHYDRAMINE HCL 50 MG/ML IJ SOLN
INTRAMUSCULAR | Status: AC
  Filled 2011-10-11: qty 1

## 2011-10-11 MED ORDER — BUTAMBEN-TETRACAINE-BENZOCAINE 2-2-14 % EX AERO
INHALATION_SPRAY | CUTANEOUS | Status: DC | PRN
  Administered 2011-10-11: 2 via TOPICAL

## 2011-10-11 MED ORDER — FENTANYL CITRATE 0.05 MG/ML IJ SOLN
INTRAMUSCULAR | Status: AC
  Filled 2011-10-11: qty 4

## 2011-10-11 MED ORDER — SODIUM CHLORIDE 0.45 % IV SOLN: INTRAVENOUS | Status: DC

## 2011-10-11 MED ORDER — MIDAZOLAM HCL 10 MG/2ML IJ SOLN: 10.0000 mg | Freq: Once | INTRAMUSCULAR | Status: DC

## 2011-10-11 MED ORDER — FENTANYL CITRATE 0.05 MG/ML IJ SOLN
INTRAMUSCULAR | Status: DC | PRN
  Administered 2011-10-11 (×3): 25 ug via INTRAVENOUS

## 2011-10-11 MED ORDER — BENZOCAINE 20 % MT SOLN: 1.0000 "application " | OROMUCOSAL | Status: DC | PRN

## 2011-10-11 MED ORDER — MIDAZOLAM HCL 10 MG/2ML IJ SOLN
INTRAMUSCULAR | Status: DC | PRN
  Administered 2011-10-11: 1 mg via INTRAVENOUS
  Administered 2011-10-11 (×2): 2 mg via INTRAVENOUS

## 2011-10-11 MED ORDER — DIPHENHYDRAMINE HCL 50 MG/ML IJ SOLN
INTRAMUSCULAR | Status: DC | PRN
  Administered 2011-10-11: 50 mg via INTRAVENOUS

## 2011-10-11 MED ORDER — SODIUM CHLORIDE 0.9 % IJ SOLN: 3.0000 mL | INTRAMUSCULAR | Status: DC | PRN

## 2011-10-11 MED ORDER — FENTANYL CITRATE 0.05 MG/ML IJ SOLN: 250.0000 ug | Freq: Once | INTRAMUSCULAR | Status: DC

## 2011-10-11 MED ORDER — SODIUM CHLORIDE 0.9 % IJ SOLN: 3.0000 mL | Freq: Two times a day (BID) | INTRAMUSCULAR | Status: DC

## 2011-10-11 MED ORDER — SODIUM CHLORIDE 0.9 % IV SOLN: 250.0000 mL | INTRAVENOUS | Status: DC | PRN

## 2011-10-11 MED ORDER — MIDAZOLAM HCL 10 MG/2ML IJ SOLN
INTRAMUSCULAR | Status: AC
  Filled 2011-10-11: qty 4

## 2011-10-11 NOTE — CV Procedure (Signed)
Transesophageal Echocardiogram: Indication: MVP / MR Sedation: Versed: 5, Fentanyl: 75, Other: 50 ASA: 2, Airway: 2  Procedure:  The patient was moderately sedated with the above doses of versed, benedryl and fentanyl.  Using digital technique an omniplane probe was advanced into the distal esophagus without incident. Transgastric imaging revealed normal LV function with no RWMA;s and no mural apical thrombus.   .  Estimated ejection fraction was 65%.  Right sided cardiac chambers were normal with no evidence of pulmonary hypertension.  The pulmonary and tricuspid valves were structurally normal.  There was mild TR The mitral valve had moderate myxomatous changes with moderate bileaflet prolapse worse in  The posterior leaflet.  There was no flail segment.  Chordae were elongated.  The PISA velocity was .96.  There was blunted forward flow in the LLPV but no reversal.  Overall moderate to severe MR The aortic valve was trileaflet with no AR/AS The aortic root was normal.    Imaging of the septum showed no ASD or VSD No PFO by 2D or color Bubble study not done  The LAE was well visualized in orthogonal views.  There was no spontaneous contrast and no thrombus.    The descending thoracic aorta had no  mural aortic debris with no evidence of aneurysmal dilation or disection  Impression:  1) Bileaflet prolapse with elongated chords. Moderate myxomatous leaflet changes.  Worse in the posterior leaflet.  Moderate to severe MR 2) EF 65% 3) Mild LAE 4) Normal AV/TV/PV 5) Normal RV/RA 6) No LAA clot 7) No ASD/PFO 8) Normal Aorta  [name] 10/11/2011 1:17 PM

## 2011-10-11 NOTE — Discharge Instructions (Addendum)
F/U with Dr [name] as scheduled F/U with Dr Cornelius Moras as scheduled Advance diet as tolerated when alert and avoid extremes of cold or hot until lidocaine spray worn off  Mitral Valvular Regurgitation Mitral valvular regurgitation (MVR, MR) is a condition in which there is a leaky mitral valve. The mitral valve is the large valve between the two left chambers of the heart. When the large muscular ventricle contracts to pump blood, the mitral valve keeps that blood from flowing backward and back into the atrium. If there is too much regurgitation, the heart has to work harder. This eventually can cause heart failure. When your heart goes into failure, you do not feel well. You have shortness of breath (dyspnea) with exertion. The kidneys do not work as well so you may retain fluid. This is one of the reasons your lower legs and ankles may swell. You may have a rapid weight gain. In addition to this swelling, the fluid retention makes fluid back up in the lungs. This causes additional shortness of breath, which then makes the failure worse. The first sign you will usually recognize is shortness of breath with exertion (climbing stairs for example). You will also usually get a rapid heartbeat. Upon discharge from this location, weigh yourself after arriving home. Record your weight at the same time every day as this will provide a record of your progress. As you get better, your weight will usually go down. Follow a low sodium (low salt) diet. You may notice that you get short of breath while sleeping. The heart actually has to work harder while you are lying down. This may also produce a night cough or make it necessary to sleep with two or more pillows. SYMPTOMS  You may have no symptoms if mitral regurgitation is mild. It may be discovered only during a routine exam by your caregiver when a heart murmur is heard. DIAGNOSIS  The best study for mitral regurgitation is the echocardiogram (ultrasound of the heart).  This shows the cause of the MR and how bad it is. It also gives information about the left ventricle and atrium (the two heart chambers that are bridged by the mitral valve.) TREATMENT   Early MR may be treated with medications. If there are no medication allergies or problems, ACE Inhibitors are commonly used in the treatment of MR. Under treatment, these symptoms usually improve rapidly. Medications treat but will not cure or slow the progression of the MR. This is more dependent on the cause of the MR.   If MR becomes more severe, surgery may become necessary to repair or replace the valve. This is called open heart surgery.   There is no absolute age limitation to valve surgery. 37 year old people have had their valves replaced. The risk of stroke and death is low with open heart surgery, but does increase a with age and other medical problems. Elderly patients that are otherwise healthy usually do well with valve replacement surgery.   A cardiac catheterization is usually done prior to valve surgery unless the patient is very young. This is done to check the health of coronary arteries. If there are blockages, a bypass can be done while the chest is open for the valve replacement.  If you are beginning to have symptoms from mitral regurgitation or are waiting for a surgical procedure to help you with this problem, following are some of the things you can do to help yourself while you are waiting for surgery or are  simply putting off the surgery to see if it is needed. HOME CARE INSTRUCTIONS   Activity Level--- Your caregiver will help you determine what type of exercise program may be helpful. It is important to maintain strength and increase it if possible. Pace your activities and avoid shortness of breath or chest pain. Plan activities for at least an hour after meals or before eating. This allows your body to handle one activity at a time. Your caregiver can help advise you for activities.     Diet--- Maintain a low salt diet or as directed by your caregiver and eat a heart healthy diet. Get diet information from your caregiver or dietician. Remove your salt shaker and avoid adding salt to you foods. Measure the amounts of fluids you take in per day in cups and record these amounts.   Discharge Medications--- You may have been prescribed an ACE inhibitor or a beta blocker to take for your heart failure. Take either as directed as this improves your heart function and your survival. Ask your caregiver if being on statins (cholesterol lowering drugs) would be helpful.   Weight Monitoring---Weigh yourself today. When you get home, compare it to your scale and record your weight. Weigh twice per day and record these weights and try to weigh at the same time every day. It is best to weigh first thing in the morning, in your same clothes, after going to the bathroom and before eating or drinking anything. Place the scale on a hard surfaced floor. Bring these weights to your caregiver to be reviewed during your appointments.   Blood pressure monitoring should be done twice per week. You can get a home blood pressure cuff at your drugstore. Record these values and bring them with you for your clinic visits. Notify your caregiver if you become dizzy or lightheaded upon standing up.   Be familiar with your medications--- If you have trouble remembering when you took them, write down times or set your medications out in advance for the day or the week to avoid problems. If you are on medications and do not remember if you have taken your medication, just skip it for that day unless your caregiver advises you otherwise. If you are on a diuretic (water pill), take these in the morning so you are not up all night going to the bathroom.   If you are currently a smoker, it is time to quit. Nicotine makes your heart work harder and is one of the leading causes of cardiac (heart) deaths. Do not leave without  a smoking cessation plan or instructions on help available to quit smoking.   Immunization with influenza and pneumococcal vaccines may reduce the risk of respiratory infection.   Nonsteroidal anti-inflammatory drugs should not be used. They can cause sodium (salt) retention and also may hurt the action of diuretics and ACE inhibitors.   Aldosterone Antagonists may have beneficial effects.   If you do not follow your diet and take your medications properly, this may rapidly lead to emergency care or hospitalization. Follow the advice of your caregiver.   What To Do If Symptoms Worsen--- If there are immediate problems go to the Emergency Department. This would include any symptoms which brought you in and which are getting worse rather than better. Call emergency services (911 in [place] for immediate care.  DAILY PATH TO QUALITY LIVING  Monitor weight and record.   Monitor blood pressure and record.   Monitor fluid intake.   Monitor Sodium  intake.   Monitor Activity Levels.   Take your medications.   Stop all use of nicotine.   Avoid alcohol.   Know when to call for help and do so.  SEEK IMMEDIATE MEDICAL CARE IF:  Your weight increases by 3 lb/1.4 kg in 1 day or 5 lb/2.3 kg in a week, or as your caregiver suggests.   You notice increasing shortness of breath during rest, sleeping, or with activity, and which is unusual for you.   You develop chest pain.   You develop sweating or nausea which is unusual for you.   You notice increased swelling in your hands, feet, ankles or abdomen.   You have a feeling of fullness in your abdomen or develop nausea or loss of appetite.   You notice dizziness, blurred vision, headache, or unsteadiness.  Make an appointment with your caregiver as directed for follow-up. MAKE SURE YOU:   Understand these instructions.   Will watch your condition.   Will get help right away if you are not doing well or get worse.  Document Released:  09/07/2004 Document Revised: 06/09/2011 Document Reviewed: 08/17/2007 [facility] Patient Information 2012 [place], [place].

## 2011-10-11 NOTE — Interval H&P Note (Signed)
History and Physical Interval Note:  10/11/2011 1:15 PM  [name]  has presented today for surgery, with the diagnosis of MVP  The various methods of treatment have been discussed with the patient and family. After consideration of risks, benefits and other options for treatment, the patient has consented to  Procedure(s) (LRB): TRANSESOPHAGEAL ECHOCARDIOGRAM (TEE) (N/A) as a surgical intervention .  The patients' history has been reviewed, patient examined, no change in status, stable for surgery.  I have reviewed the patients' chart and labs.  Questions were answered to the patient's satisfaction.     Charlton Haws  Reviewed echo from 2012 and 3/13  Bileaflet prolapse.  Increasing dyspnea and palpitations TEE to assess for surgery
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
## 2011-10-12 ENCOUNTER — Encounter (HOSPITAL_COMMUNITY): Payer: Self-pay | Admitting: Cardiovascular Disease

## 2011-10-12 ENCOUNTER — Ambulatory Visit: Payer: BC Managed Care – PPO | Admitting: Cardiology

## 2011-10-13 ENCOUNTER — Encounter: Payer: Self-pay | Admitting: Cardiology

## 2011-10-24 ENCOUNTER — Ambulatory Visit: Payer: BC Managed Care – PPO | Admitting: Thoracic Surgery (Cardiothoracic Vascular Surgery)

## 2011-10-28 ENCOUNTER — Ambulatory Visit (INDEPENDENT_AMBULATORY_CARE_PROVIDER_SITE_OTHER): Payer: BC Managed Care – PPO | Admitting: Thoracic Surgery (Cardiothoracic Vascular Surgery)

## 2011-10-28 ENCOUNTER — Encounter: Payer: Self-pay | Admitting: Thoracic Surgery (Cardiothoracic Vascular Surgery)

## 2011-10-28 VITALS — BP 136/92 | HR 96 | Resp 18 | Ht 63.0 in | Wt 151.0 lb

## 2011-10-28 DIAGNOSIS — I341 Nonrheumatic mitral (valve) prolapse: Secondary | ICD-10-CM | POA: Insufficient documentation

## 2011-10-28 DIAGNOSIS — I059 Rheumatic mitral valve disease, unspecified: Secondary | ICD-10-CM

## 2011-10-28 DIAGNOSIS — I340 Nonrheumatic mitral (valve) insufficiency: Secondary | ICD-10-CM | POA: Insufficient documentation

## 2011-10-28 NOTE — Progress Notes (Signed)
[address]            [name] [address]          [phone]     CARDIOTHORACIC SURGERY OFFICE NOTE  Referring Provider is [name], Jesse Sans, MD PCP is [name], MD, MD   HPI:  Patient returns for followup of symptomatic mitral regurgitation. Since her last office visit she underwent transesophageal echocardiogram. This demonstrates at least moderate to severe mitral regurgitation with bileaflet prolapse. The patient returns to the office today to discuss these findings. She continues to describe mild exertional shortness of breath. She has some palpitations. She also describes some fullness in her chest with early satiety. She has no difficulty swallowing. She has normal bowel function. She has no dominant pain.   Current Outpatient Prescriptions  Medication Sig Dispense Refill  . ACZONE 5 % topical gel Apply topically 2 (two) times daily.       . Doxylamine Succinate, Sleep, (UNISOM PO) Take by mouth as needed.      . metoprolol tartrate (LOPRESSOR) 25 MG tablet Take 25 mg by mouth 2 (two) times daily. 1/2 tab (12.5 mg ) po BID      . SOLODYN 80 MG TB24 Take 1 tablet by mouth 2 (two) times daily.           Physical Exam:   BP 136/92  Pulse 96  Resp 18  Ht 5\' 3"  (1.6 m)  Wt 151 lb (68.493 kg)  BMI 26.75 kg/m2  SpO2 99%  LMP 10/20/2011  General:  Well-appearing  Chest:   Clear to auscultation  CV:   Regular rate and rhythm with systolic murmur  Incisions:  n/a  Abdomen:  Soft and nontender  Extremities:  Warm and well-perfused  Diagnostic Tests:  Transesophageal Echocardiogram:  Indication: MVP / MR  Sedation: Versed: 5, Fentanyl: 75, Other: 50  ASA: 2, Airway: 2  Procedure: The patient was moderately sedated with the above doses of versed, benedryl and fentanyl. Using digital technique an omniplane probe was advanced into the distal esophagus without incident. Transgastric imaging revealed normal LV function with no RWMA;s and  no mural apical thrombus. . Estimated ejection fraction was 65%. Right sided cardiac chambers were normal with no evidence of pulmonary hypertension.  The pulmonary and tricuspid valves were structurally normal. There was mild TR  The mitral valve had moderate myxomatous changes with moderate bileaflet prolapse worse in  The posterior leaflet. There was no flail segment. Chordae were elongated. The PISA velocity was  .96. There was blunted forward flow in the LLPV but no reversal. Overall moderate to severe MR  The aortic valve was trileaflet with no AR/AS  The aortic root was normal.  Imaging of the septum showed no ASD or VSD No PFO by 2D or color Bubble study not done  The LAE was well visualized in orthogonal views. There was no spontaneous contrast and no thrombus.  The descending thoracic aorta had no mural aortic debris with no evidence of aneurysmal dilation or disection  Impression:  1) Bileaflet prolapse with elongated chords. Moderate myxomatous leaflet changes. Worse in the posterior leaflet. Moderate to severe MR  2) EF 65%  3) Mild LAE  4) Normal AV/TV/PV  5) Normal RV/RA  6) No LAA clot  7) No ASD/PFO  8) Normal Aorta  Charlton Haws  10/11/2011  1:17 PM    Impression:  Patient has bileaflet mitral valve prolapse with at least moderate mitral regurgitation. Left ventricular function is preserved. Based upon transesophageal echocardiogram findings I feel there would be a very high likelihood for successful valve repair. There are no ruptured cords nor flail segments demonstrated and there was no flow reversal in the pulmonary veins.  An attempt to quantify the mitral regurgitation was not performed. Based upon  These findings I think it would be reasonable to continue to follow the patient closely with serial echocardiograms.  However the patient continues to have symptoms and wants to get things taken care of rather than postponing and inevitable operation.      Plan:  Because of the question regarding the severity of mitral regurgitation, I favor proceeding with a stress echocardiogram. If the patient truly develops exertional shortness of breath with stress and the mitral regurgitation gets worse on stress echo, then I think it would make perfect sense to proceed with elective mitral valve repair in the near future. We will contact Dr. Vern Claude office to make arrangements for this to be performed in the near future. We'll plan to see the patient back in 2 weeks after the stress echo has been performed. If it is positive she will need cardiac catheterization.   Salvatore Decent. Cornelius Moras, MD 10/28/2011 2:09 PM

## 2011-10-31 ENCOUNTER — Other Ambulatory Visit: Payer: Self-pay | Admitting: Cardiovascular Disease

## 2011-10-31 DIAGNOSIS — I340 Nonrheumatic mitral (valve) insufficiency: Secondary | ICD-10-CM

## 2011-11-01 ENCOUNTER — Other Ambulatory Visit (HOSPITAL_COMMUNITY): Payer: BC Managed Care – PPO

## 2011-11-09 ENCOUNTER — Ambulatory Visit: Payer: BC Managed Care – PPO | Admitting: Cardiology

## 2011-11-10 ENCOUNTER — Other Ambulatory Visit (HOSPITAL_COMMUNITY): Payer: Self-pay | Admitting: Radiology

## 2011-11-10 ENCOUNTER — Telehealth: Payer: Self-pay | Admitting: Cardiovascular Disease

## 2011-11-10 DIAGNOSIS — R0602 Shortness of breath: Secondary | ICD-10-CM

## 2011-11-10 NOTE — Telephone Encounter (Signed)
Answered pt's questions.

## 2011-11-10 NOTE — Telephone Encounter (Signed)
PT HAS QUESTION RE TEST TOMORROW

## 2011-11-11 ENCOUNTER — Encounter: Payer: Self-pay | Admitting: Cardiovascular Disease

## 2011-11-11 ENCOUNTER — Ambulatory Visit (HOSPITAL_COMMUNITY): Payer: BC Managed Care – PPO | Attending: Cardiovascular Disease

## 2011-11-11 DIAGNOSIS — I359 Nonrheumatic aortic valve disorder, unspecified: Secondary | ICD-10-CM | POA: Insufficient documentation

## 2011-11-11 DIAGNOSIS — R0989 Other specified symptoms and signs involving the circulatory and respiratory systems: Secondary | ICD-10-CM | POA: Insufficient documentation

## 2011-11-11 DIAGNOSIS — R002 Palpitations: Secondary | ICD-10-CM | POA: Insufficient documentation

## 2011-11-11 DIAGNOSIS — R0609 Other forms of dyspnea: Secondary | ICD-10-CM | POA: Insufficient documentation

## 2011-11-11 DIAGNOSIS — R0602 Shortness of breath: Secondary | ICD-10-CM

## 2011-11-14 ENCOUNTER — Ambulatory Visit (INDEPENDENT_AMBULATORY_CARE_PROVIDER_SITE_OTHER): Payer: BC Managed Care – PPO | Admitting: Thoracic Surgery (Cardiothoracic Vascular Surgery)

## 2011-11-14 ENCOUNTER — Other Ambulatory Visit (HOSPITAL_COMMUNITY): Payer: BC Managed Care – PPO

## 2011-11-14 ENCOUNTER — Telehealth: Payer: Self-pay | Admitting: Cardiology

## 2011-11-14 VITALS — BP 148/88 | HR 104 | Resp 16 | Ht 63.0 in | Wt 150.0 lb

## 2011-11-14 DIAGNOSIS — I059 Rheumatic mitral valve disease, unspecified: Secondary | ICD-10-CM

## 2011-11-14 DIAGNOSIS — I340 Nonrheumatic mitral (valve) insufficiency: Secondary | ICD-10-CM

## 2011-11-14 NOTE — Telephone Encounter (Signed)
New Problem:     Patient had questions about her results, did not specify, and would like a call back.  Please call back.

## 2011-11-14 NOTE — Telephone Encounter (Signed)
I spoke to Mrs. Williams on the telephone at length. Reviewed all studies including all 3 echocardiograms. I confirmed this with Dr. Cornelius Moras and she only has mild mitral regurgitation. Valve surgery is not indicated at the present time her Dr Cornelius Moras. I agree with this as discussed with him. He will see her back in 6 months for followup with perhaps a repeat  Echocardiogram. She tells me that she thinks she needs a heart catheterization. With a negative stress echo I do not think that this is indicated. The risks clearly outweigh any potential value. The likelihood of any significant coronary disease is very low. I also discussed this with Dr. Cornelius Moras.

## 2011-11-14 NOTE — Progress Notes (Signed)
[address]            [name] [address]          [phone]     CARDIOTHORACIC SURGERY OFFICE NOTE  Referring Provider is [name], Jesse Sans, MD PCP is [name], MD, MD   HPI:  Patient returns for further followup of mitral valve prolapse and mitral regurgitation.  Transesophageal echocardiogram was performed by Dr. Eden Emms. Confirmed the presence of mitral valve prolapse and at least moderate mitral regurgitation. However, agreed regurgitation was felt not to be severe by Dr. Eden Emms and there were no ruptured cords nor segments of flail leaflet. Further stratify her risks the patient underwent stress echocardiogram last week. She returns for further followup today.   Current Outpatient Prescriptions  Medication Sig Dispense Refill  . ACZONE 5 % topical gel Apply topically 2 (two) times daily.       . Doxylamine Succinate, Sleep, (UNISOM PO) Take by mouth as needed.      . metoprolol tartrate (LOPRESSOR) 25 MG tablet Take 25 mg by mouth 2 (two) times daily. 1/2 tab (12.5 mg ) po BID      . SOLODYN 80 MG TB24 Take 1 tablet by mouth 2 (two) times daily.           Physical Exam:   BP 148/88  Pulse 104  Resp 16  Ht 5\' 3"  (1.6 m)  Wt 150 lb (68.04 kg)  BMI 26.57 kg/m2  SpO2 99%  LMP 10/20/2011  General:  Well-appearing  Chest:   Clear to auscultation  CV:   Regular rate and rhythm with faint murmur  Incisions:  n/a  Abdomen:  Soft  Extremities:  Warm  Diagnostic Tests:  ------------------------------------------------------------ Stress Echocardiography  Patient: [name], [name] MR #: [id number] Study Date: 11/11/2011 Gender: F Age: 36 Height: 158.8cm Weight: 70.5kg BSA: 1.34m^2 Pt. Status: Room:  ATTENDING Al Decant SONOGRAPHER Aida Raider, RDCS PERFORMING Red[facility] Gainer, Site  3 cc:  ------------------------------------------------------------  ------------------------------------------------------------ Indications: Dyspnea 786.09.  ------------------------------------------------------------ History: PMH: Mitral Valve Prolapse. Palpitations. Tachycardia. Dyspnea. Fatigue. Risk factors: Former tobacco use. Medications: No other medications. Bruce protocol. Stress echocardiography. Height: Height: 158.8cm. Height: 62.5in. Weight: Weight: 70.5kg. Weight: 155lb. Body mass index: BMI: 28kg/m^2. Body surface area: BSA: 1.19m^2. Blood pressure: 134/88. Patient status: Outpatient.  ------------------------------------------------------------  ------------------------------------------------------------ Stress protocol:  +---------------------+---+------------+---+--------+ Stage HR BP (mmHg) SatSymptoms +---------------------+---+------------+---+--------+ Baseline 401027/25 (103)98%None  +---------------------+---+------------+---+--------+ Stage 1 142136/89 (105)98%Dyspnea  +---------------------+---+------------+---+--------+ Stage 2 366440/34 (108)99%None  +---------------------+---+------------+---+--------+ Stage 3 187159/91 (114)98%-------- +---------------------+---+------------+---+--------+ Immediate post [phone] (111)---None  +---------------------+---+------------+---+--------+ Recovery; 1 min 136169/94 (119)96%None  +---------------------+---+------------+---+--------+ Recovery; 2 min 133147/75 (99) 99%None  +---------------------+---+------------+---+--------+ Recovery; 3 min 122141/73 (96) 98%None  +---------------------+---+------------+---+--------+ Recovery; 4 min 116---------------None  +---------------------+---+------------+---+--------+ Recovery; 5 min 122136/70 (92) 99%None  +---------------------+---+------------+---+--------+ Late recovery  115---------------None  +---------------------+---+------------+---+--------+  ------------------------------------------------------------ Stress results: Maximal heart rate during stress was 187bpm (102% of maximal predicted heart rate). The maximal predicted heart rate was 183bpm.The target heart rate was achieved. The heart rate response to stress was normal. There was a normal resting blood pressure with an appropriate response to stress. The rate-pressure product for the peak heart rate and blood pressure was Hg/min.  ------------------------------------------------------------ Stress ECG: There were no ST or T wave changes to suggest ischemia. She had several PVCs durng the study. There were no ST or T wave changes to suggest ischemia.  ------------------------------------------------------------  Baseline:  Peak stress:  ------------------------------------------------------------ Stress echo results: This study was performed to evaluate the mitral valve. All views include color doppler so specific wall motion was not addressed.  At rest, there is mild ( or perhaps mild-moderate) MR. At stress , the MR appears to be unchanged. The HR was 136 at peak exercise so the MR jet appears only in a single frame during each cardiac cycle.  Impression: Mild MR with no significant worsening during stress.  ------------------------------------------------------------ Prepared and Electronically Authenticated by  Kristeen Miss     Impression:  Patient has bileaflet prolapse of the mitral valve with probably moderate mitral regurgitation. The severity of mitral regurgitation did not increase with stress.  She continues to experience symptoms of exertional shortness of breath and atypical chest pain.    Plan:  I again discussed matters at length with the patient and her mother here in the office today. I am reluctant to consider elective mitral valve repair at  this time given the fact that by all quantitative measures her severity of mitral regurgitation is less than severe. I think it would be very safe to continue to follow her carefully with serial echocardiograms. The patient is very anxious about this and seems to feel convinced that something needs to be done at this time. Under the circumstances, it might be prudent to consider diagnostic catheterization to rule out coronary artery disease, although given the patient's relatively young age and negative stress echo this would also be very unlikely.  I will contact Dr. [name] to potentially arrange elective left and right heart catheterization.  We will plan to see her back in 3 weeks.   Salvatore Decent. Cornelius Moras, MD 11/14/2011 1:12 PM

## 2011-11-14 NOTE — Telephone Encounter (Signed)
Pt calls to discuss details of previous echo results from 2012 in comparison with stress echo and any significant findings regarding her MR. Discussed findings with pt and reassured her. Pt will keep her appt with Dr. Cornelius Moras for face to face discussion and reassurance of her MR.  Mylo Red RN

## 2011-11-16 ENCOUNTER — Telehealth: Payer: Self-pay | Admitting: Thoracic Surgery (Cardiothoracic Vascular Surgery)

## 2011-11-16 DIAGNOSIS — I340 Nonrheumatic mitral (valve) insufficiency: Secondary | ICD-10-CM

## 2011-11-16 NOTE — Telephone Encounter (Signed)
I spoke with Ms. [name] over the telephone today.  She desires to see a different cardiologist for a second opinion regarding her ongoing symptoms of exertional shortness of breath and atypical chest pain associated with mitral valve prolapse and moderate mitral regurgitation.  I have discussed her case briefly with Dr Antoine Poche who suggests that CPX testing might be helpful.  We will facilitate a referral for her to see Dr Antoine Poche in the office.

## 2011-11-20 ENCOUNTER — Encounter (HOSPITAL_COMMUNITY): Payer: Self-pay | Admitting: Emergency Medicine

## 2011-11-20 ENCOUNTER — Emergency Department (HOSPITAL_COMMUNITY): Payer: BC Managed Care – PPO

## 2011-11-20 ENCOUNTER — Emergency Department (HOSPITAL_COMMUNITY)
Admission: EM | Admit: 2011-11-20 | Discharge: 2011-11-20 | Disposition: A | Discharge status: Home / Self Care | Payer: BC Managed Care – PPO | Attending: Emergency Medicine | Admitting: Emergency Medicine

## 2011-11-20 DIAGNOSIS — K219 Gastro-esophageal reflux disease without esophagitis: Secondary | ICD-10-CM | POA: Insufficient documentation

## 2011-11-20 DIAGNOSIS — F329 Major depressive disorder, single episode, unspecified: Secondary | ICD-10-CM | POA: Insufficient documentation

## 2011-11-20 DIAGNOSIS — Z79899 Other long term (current) drug therapy: Secondary | ICD-10-CM | POA: Insufficient documentation

## 2011-11-20 DIAGNOSIS — M7989 Other specified soft tissue disorders: Secondary | ICD-10-CM | POA: Insufficient documentation

## 2011-11-20 DIAGNOSIS — R0602 Shortness of breath: Secondary | ICD-10-CM | POA: Insufficient documentation

## 2011-11-20 DIAGNOSIS — R42 Dizziness and giddiness: Secondary | ICD-10-CM | POA: Insufficient documentation

## 2011-11-20 DIAGNOSIS — F3289 Other specified depressive episodes: Secondary | ICD-10-CM | POA: Insufficient documentation

## 2011-11-20 DIAGNOSIS — R002 Palpitations: Secondary | ICD-10-CM | POA: Insufficient documentation

## 2011-11-20 DIAGNOSIS — R079 Chest pain, unspecified: Secondary | ICD-10-CM | POA: Insufficient documentation

## 2011-11-20 DIAGNOSIS — R011 Cardiac murmur, unspecified: Secondary | ICD-10-CM | POA: Insufficient documentation

## 2011-11-20 DIAGNOSIS — R209 Unspecified disturbances of skin sensation: Secondary | ICD-10-CM | POA: Insufficient documentation

## 2011-11-20 DIAGNOSIS — Z7982 Long term (current) use of aspirin: Secondary | ICD-10-CM | POA: Insufficient documentation

## 2011-11-20 LAB — CBC
Hemoglobin: 12.3 g/dL (ref 12.0–15.0)
MCH: 29 pg (ref 26.0–34.0)
Platelets: 254 10*3/uL (ref 150–400)
RBC: 4.24 MIL/uL (ref 3.87–5.11)

## 2011-11-20 LAB — BASIC METABOLIC PANEL
Chloride: 104 mEq/L (ref 96–112)
GFR calc Af Amer: 89 mL/min — ABNORMAL LOW (ref >90)
GFR calc non Af Amer: 77 mL/min — ABNORMAL LOW (ref >90)
Glucose, Bld: 99 mg/dL (ref 70–99)
Potassium: 3.4 mEq/L — ABNORMAL LOW (ref 3.5–5.1)
Sodium: 137 mEq/L (ref 135–145)

## 2011-11-20 LAB — HEPATIC FUNCTION PANEL
ALT: 12 U/L (ref 0–35)
AST: 15 U/L (ref 0–37)
Albumin: 3.5 g/dL (ref 3.5–5.2)
Alkaline Phosphatase: 75 U/L (ref 39–117)
Total Bilirubin: 0.2 mg/dL — ABNORMAL LOW (ref 0.3–1.2)

## 2011-11-20 LAB — DIFFERENTIAL
Basophils Relative: 0 % (ref 0–1)
Eosinophils Absolute: 0.1 10*3/uL (ref 0.0–0.7)
Lymphs Abs: 1.3 10*3/uL (ref 0.7–4.0)
Neutro Abs: 5.8 10*3/uL (ref 1.7–7.7)
Neutrophils Relative %: 75 % (ref 43–77)

## 2011-11-20 LAB — CARDIAC PANEL(CRET KIN+CKTOT+MB+TROPI)
CK, MB: 1.5 ng/mL (ref 0.3–4.0)
Relative Index: INVALID (ref 0.0–2.5)
Troponin I: <0.3 ng/mL (ref <0.30)

## 2011-11-20 LAB — URINALYSIS, ROUTINE W REFLEX MICROSCOPIC
Bilirubin Urine: NEGATIVE
Hgb urine dipstick: NEGATIVE
Ketones, ur: NEGATIVE mg/dL
Protein, ur: NEGATIVE mg/dL
Urobilinogen, UA: 0.2 mg/dL (ref 0.0–1.0)

## 2011-11-20 MED ORDER — ENOXAPARIN SODIUM 80 MG/0.8ML ~~LOC~~ SOLN
1.0000 mg/kg | Freq: Once | SUBCUTANEOUS | Status: AC
  Administered 2011-11-20: 70 mg via SUBCUTANEOUS
  Filled 2011-11-20: qty 0.8

## 2011-11-20 NOTE — ED Notes (Signed)
Pt reports chronic hx of heart palpitations is seeing Dr. Barry Dienes for this
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
## 2011-11-20 NOTE — ED Notes (Addendum)
C/o intermittent pressure to center of chest that started after eating yesterday.  States symptoms stronger after eating today.  Reports chronic sob.  Denies nausea and vomiting.  Reports swelling to R axilla and L lower leg today.  No known injury. Reports headache that started this morning.

## 2011-11-20 NOTE — ED Notes (Signed)
Patient transported to X-ray 

## 2011-11-20 NOTE — Discharge Instructions (Signed)
Palpitations  Return to the vascular lab tomorrow for an ultrasound of her left leg. Followup with your cardiologist this week. Return to ED if you develop new or worsening symptoms. A palpitation is the feeling that your heartbeat is irregular or is faster than normal. Although this is frightening, it usually is not serious. Palpitations may be caused by excesses of smoking, caffeine, or alcohol. They are also brought on by stress and anxiety. Sometimes, they are caused by heart disease. Unless otherwise noted, your caregiver did not find any signs of serious illness at this time. HOME CARE INSTRUCTIONS  To help prevent palpitations:  Drink decaffeinated coffee, tea, and soda pop. Avoid chocolate.   If you smoke or drink alcohol, quit or cut down as much as possible.   Reduce your stress or anxiety level. Biofeedback, yoga, or meditation will help you relax. Physical activity such as swimming, jogging, or walking also may be helpful.  SEEK MEDICAL CARE IF:   You continue to have a fast heartbeat.   Your palpitations occur more often.  SEEK IMMEDIATE MEDICAL CARE IF: You develop chest pain, shortness of breath, severe headache, dizziness, or fainting. Document Released: 06/17/2000 Document Revised: 06/09/2011 Document Reviewed: 08/17/2007 [facility] Patient Information 2012 [place], [place].

## 2011-11-20 NOTE — ED Notes (Addendum)
Pt c/o intermittent chest pressure, epigastric pressure/fullness, nausea, and sob starting yesterday after eating lunch, reports waking this am w/RLE swelling, (R) axillary swelling, and a headache.

## 2011-11-20 NOTE — ED Notes (Addendum)
Received bedside report from [place], [place].  Patient currently resting quietly in bed; no respiratory or acute distress noted.  Patient updated on plan of care; informed patient that we are currently waiting on further orders/disposition from EDP.  Introduced self to patient and updated whiteboard in room. Patient has no other questions or concerns at this time; will continue to monitor.

## 2011-11-20 NOTE — ED Notes (Signed)
Pt reports the "pressure" gets worse w/food.

## 2011-11-20 NOTE — ED Notes (Signed)
Waiting on Lovenox to be delivered from pharmacy.

## 2011-11-22 ENCOUNTER — Ambulatory Visit: Payer: BC Managed Care – PPO | Admitting: Gastroenterology

## 2011-11-23 DIAGNOSIS — L0390 Cellulitis, unspecified: Secondary | ICD-10-CM | POA: Insufficient documentation

## 2011-11-29 ENCOUNTER — Ambulatory Visit (INDEPENDENT_AMBULATORY_CARE_PROVIDER_SITE_OTHER): Payer: BC Managed Care – PPO | Admitting: Cardiology

## 2011-11-29 ENCOUNTER — Encounter: Payer: Self-pay | Admitting: Cardiology

## 2011-11-29 VITALS — BP 134/90 | HR 133 | Ht 63.0 in | Wt 157.1 lb

## 2011-11-29 DIAGNOSIS — I340 Nonrheumatic mitral (valve) insufficiency: Secondary | ICD-10-CM

## 2011-11-29 DIAGNOSIS — R000 Tachycardia, unspecified: Secondary | ICD-10-CM

## 2011-11-29 DIAGNOSIS — I059 Rheumatic mitral valve disease, unspecified: Secondary | ICD-10-CM

## 2011-11-29 NOTE — Assessment & Plan Note (Signed)
The patient did have a significant heart rate increase with standing. Her blood pressure actually rose. 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  I discussed this with her and I will provide her with literature. I think she would tolerate increased salt as her mitral regurgitation is not severe. We discussed compression stockings. She will consider these therapies and report to me whether she has any increasing dizziness, presyncope syncope or symptomatic palpitations.

## 2011-11-29 NOTE — Assessment & Plan Note (Signed)
I have reviewed the available studies. At this point the mitral regurgitation is moderate and would not meet indication for repair. Her symptoms cannot be clearly related to this condition. I had a long discussion about this with her. I explained that she does not want to have surgery before is indicated as there is no guarantee it will treat her symptoms such as fatigue. She became tearful in the office. I did review labs and note that she is not anemic and has had normal thyroid and BNP recently. I do think she needs followup of her mitral valve and she should come back in 6 months and we will consider repeat transthoracic echo.
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
## 2011-11-29 NOTE — Patient Instructions (Addendum)
The current medical regimen is effective;  continue present plan and medications.  Follow up in 6 months with Dr Antoine Poche.  You will receive a letter in the mail 2 months before you are due.  Please call us when you receive this letter to schedule your follow up appointment.  Postural Orthostatic Tachycardia Syndrome Postural orthostatic tachycardia syndrome (POTS) is an increased heart rate when going from a lying (supine) position to a standing position. The heart rate may increase more than 30 beats per minute (BPM) above its resting rate when going from a lying to a standing position. POTS occurs more frequently in women than in men.  SYMPTOMS  POTS symptoms may be increased in the morning. Symptoms of POTS include:  Fainting or near fainting.   Inability to think clearly.   Extreme or chronic fatigue.   Exercise intolerance.   Chest pain.   Having the lower legs develop a reddish-blue color due to decreased blood flow (acrocyanosis).  CAUSES POTS can be caused by different conditions. Sometimes, it has no known cause (idiopathic). Some causes of POTS include:  Viral illness.   Pregnancy.   Autoimmune diseases.   Medications.   Major surgery.   Trauma such as a car accident or major injury.   Medical conditions such as anemia, dehydration, and hyperthyroidism.  DIAGNOSIS  POTS is diagnosed by:  Taking a complete history and physical exam.   Measuring the heart rate while lying and then upon standing.   Measuring blood pressure when going from a lying to a standing position. POTS is usually not associated with low blood pressure (othostatic hypotention) when going from a lying to standing position. While standing, blood pressure should be taken 2, 5, and 10 minutes after getting up.  TREATMENT  Treatment of POTS depends upon the severity of the symptoms. Treatment includes:  Drinking plenty of fluids to avoid getting dehydrated.   Avoiding very hot environments to  not get overheated.   Increasing your dietary salt intake as instructed by your caregiver.   Taking different types of medications as prescribed for POTS.   Avoiding some classes of medications such as vasodilators and diuretics.  SEEK IMMEDIATE MEDICAL CARE IF  You have severe chest pain that does not go away. Call your local emergency service immediately.   You feel your heart racing or beating rapidly.   You feel like passing out.   You have very confused thinking.  MAKE SURE YOU  Understand these instructions.   Will watch your condition.   Will get help right away if you are not doing well or get worse.  Document Released: 06/10/2002 Document Revised: 06/09/2011 Document Reviewed: 08/18/2010 [facility] Patient Information 2012 [place], [place].

## 2011-12-05 ENCOUNTER — Ambulatory Visit: Payer: BC Managed Care – PPO | Admitting: Gastroenterology

## 2011-12-05 ENCOUNTER — Ambulatory Visit: Payer: BC Managed Care – PPO | Admitting: Thoracic Surgery (Cardiothoracic Vascular Surgery)

## 2011-12-20 ENCOUNTER — Encounter: Payer: Self-pay | Admitting: Cardiology

## 2011-12-22 ENCOUNTER — Ambulatory Visit: Payer: BC Managed Care – PPO | Admitting: Cardiology

## 2011-12-23 ENCOUNTER — Ambulatory Visit: Payer: BC Managed Care – PPO | Admitting: Gastroenterology

## 2012-01-03 ENCOUNTER — Encounter: Payer: Self-pay | Admitting: Internal Medicine

## 2012-01-03 ENCOUNTER — Other Ambulatory Visit: Payer: Self-pay | Admitting: Internal Medicine

## 2012-01-03 LAB — LIPID PANEL
Cholesterol: 187 mg/dL (ref 0–200)
LDL (calc): 120
Total CHOL/HDL Ratio: 3.7
VLDL Cholesterol Cal: 16

## 2012-01-03 LAB — TSH: TSH: 2.131

## 2012-01-16 ENCOUNTER — Other Ambulatory Visit: Payer: Self-pay | Admitting: Obstetrics and Gynecology

## 2012-01-23 ENCOUNTER — Inpatient Hospital Stay (HOSPITAL_COMMUNITY)
Admission: AD | Admit: 2012-01-23 | Discharge: 2012-01-23 | Disposition: A | Discharge status: Hospice | Payer: BC Managed Care – PPO | Source: Ambulatory Visit | Attending: Obstetrics and Gynecology | Admitting: Obstetrics and Gynecology

## 2012-01-23 ENCOUNTER — Encounter (HOSPITAL_COMMUNITY): Payer: Self-pay | Admitting: *Deleted

## 2012-01-23 DIAGNOSIS — R5082 Postprocedural fever: Secondary | ICD-10-CM

## 2012-01-23 DIAGNOSIS — G8918 Other acute postprocedural pain: Secondary | ICD-10-CM

## 2012-01-23 DIAGNOSIS — R1084 Generalized abdominal pain: Secondary | ICD-10-CM

## 2012-01-23 LAB — CBC WITH DIFFERENTIAL/PLATELET
Eosinophils Relative: 1 % (ref 0–5)
HCT: 38.2 % (ref 36.0–46.0)
Lymphocytes Relative: 11 % — ABNORMAL LOW (ref 12–46)
Lymphs Abs: 1.3 10*3/uL (ref 0.7–4.0)
MCV: 87.2 fL (ref 78.0–100.0)
Monocytes Absolute: 1 10*3/uL (ref 0.1–1.0)
Platelets: 274 10*3/uL (ref 150–400)
RBC: 4.38 MIL/uL (ref 3.87–5.11)
WBC: 12.1 10*3/uL — ABNORMAL HIGH (ref 4.0–10.5)

## 2012-01-23 LAB — COMPREHENSIVE METABOLIC PANEL
ALT: 20 U/L (ref 0–35)
CO2: 29 mEq/L (ref 19–32)
Calcium: 9.2 mg/dL (ref 8.4–10.5)
GFR calc Af Amer: >90 mL/min (ref >90)
GFR calc non Af Amer: >90 mL/min (ref >90)
Glucose, Bld: 98 mg/dL (ref 70–99)
Sodium: 137 mEq/L (ref 135–145)

## 2012-01-23 LAB — URINALYSIS, ROUTINE W REFLEX MICROSCOPIC
Glucose, UA: NEGATIVE mg/dL
Hgb urine dipstick: NEGATIVE
Specific Gravity, Urine: 1.015 (ref 1.005–1.030)

## 2012-01-23 MED ORDER — KETOROLAC TROMETHAMINE 60 MG/2ML IM SOLN
60.0000 mg | Freq: Once | INTRAMUSCULAR | Status: AC
  Administered 2012-01-23: 60 mg via INTRAMUSCULAR
  Filled 2012-01-23: qty 2
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
## 2012-01-23 NOTE — MAU Note (Signed)
Pt with c/o bleeding in urine and when she wipes s/p hysterectomy by Dr Henderson Cloud 01/16/12.  Pt with slight temp last night for which she took ibuprofen.  Pt has pain from under breast to lower abdomen which occasionally shoots down into her leges. Pt was to make appointment in Dr's office but decided to just come to MAU to be seen.

## 2012-02-09 ENCOUNTER — Telehealth: Payer: Self-pay | Admitting: Cardiology

## 2012-02-09 NOTE — Telephone Encounter (Signed)
CD of Stress/Echo Mailed To : [facility] Attn: Diane [address] [place] [address] 02/09/12/KM

## 2012-05-15 ENCOUNTER — Encounter (HOSPITAL_COMMUNITY): Payer: Self-pay | Admitting: *Deleted

## 2012-05-15 ENCOUNTER — Emergency Department (HOSPITAL_COMMUNITY)
Admission: EM | Admit: 2012-05-15 | Discharge: 2012-05-15 | Disposition: A | Discharge status: Home / Self Care | Payer: BC Managed Care – PPO | Attending: Emergency Medicine | Admitting: Emergency Medicine

## 2012-05-15 ENCOUNTER — Emergency Department (HOSPITAL_COMMUNITY): Payer: BC Managed Care – PPO

## 2012-05-15 DIAGNOSIS — I059 Rheumatic mitral valve disease, unspecified: Secondary | ICD-10-CM | POA: Insufficient documentation

## 2012-05-15 DIAGNOSIS — Z8719 Personal history of other diseases of the digestive system: Secondary | ICD-10-CM | POA: Insufficient documentation

## 2012-05-15 DIAGNOSIS — Z872 Personal history of diseases of the skin and subcutaneous tissue: Secondary | ICD-10-CM | POA: Insufficient documentation

## 2012-05-15 DIAGNOSIS — Z8679 Personal history of other diseases of the circulatory system: Secondary | ICD-10-CM | POA: Insufficient documentation

## 2012-05-15 DIAGNOSIS — R002 Palpitations: Secondary | ICD-10-CM | POA: Insufficient documentation

## 2012-05-15 DIAGNOSIS — Z8619 Personal history of other infectious and parasitic diseases: Secondary | ICD-10-CM | POA: Insufficient documentation

## 2012-05-15 DIAGNOSIS — J309 Allergic rhinitis, unspecified: Secondary | ICD-10-CM | POA: Insufficient documentation

## 2012-05-15 DIAGNOSIS — Z8669 Personal history of other diseases of the nervous system and sense organs: Secondary | ICD-10-CM | POA: Insufficient documentation

## 2012-05-15 DIAGNOSIS — Z9851 Tubal ligation status: Secondary | ICD-10-CM | POA: Insufficient documentation

## 2012-05-15 DIAGNOSIS — Z8659 Personal history of other mental and behavioral disorders: Secondary | ICD-10-CM | POA: Insufficient documentation

## 2012-05-15 DIAGNOSIS — Z87891 Personal history of nicotine dependence: Secondary | ICD-10-CM | POA: Insufficient documentation

## 2012-05-15 LAB — BASIC METABOLIC PANEL
Chloride: 98 mEq/L (ref 96–112)
GFR calc Af Amer: 85 mL/min — ABNORMAL LOW (ref >90)
GFR calc non Af Amer: 74 mL/min — ABNORMAL LOW (ref >90)
Glucose, Bld: 89 mg/dL (ref 70–99)
Potassium: 3.8 mEq/L (ref 3.5–5.1)
Sodium: 138 mEq/L (ref 135–145)

## 2012-05-15 LAB — CBC WITH DIFFERENTIAL/PLATELET
Hemoglobin: 15 g/dL (ref 12.0–15.0)
Lymphs Abs: 1.7 10*3/uL (ref 0.7–4.0)
MCH: 29.4 pg (ref 26.0–34.0)
Monocytes Relative: 5 % (ref 3–12)
Neutro Abs: 5.5 10*3/uL (ref 1.7–7.7)
Neutrophils Relative %: 72 % (ref 43–77)
RBC: 5.11 MIL/uL (ref 3.87–5.11)

## 2012-05-15 LAB — POCT I-STAT TROPONIN I

## 2012-05-15 LAB — D-DIMER, QUANTITATIVE: D-Dimer, Quant: <0.27 ug/mL-FEU (ref 0.00–0.48)

## 2012-05-15 NOTE — ED Notes (Signed)
Pt is here with chest pain that started yesterday and is intermittent.  Pt states since being started on a diuretic recently, has experienced dizziness over the last couple of days with palpitations

## 2012-05-15 NOTE — ED Notes (Signed)
Pt requesting to leave to pick up children.  Education provided to patient on risks of leaving prior to results of d-dimer and next troponin level draw. Pt. Reports understanding and states will wait for results.  Pt resting in bed comfortably.
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
## 2012-05-17 ENCOUNTER — Ambulatory Visit: Payer: BC Managed Care – PPO | Admitting: Physician Assistant

## 2012-05-23 ENCOUNTER — Ambulatory Visit (INDEPENDENT_AMBULATORY_CARE_PROVIDER_SITE_OTHER): Payer: BC Managed Care – PPO | Admitting: Cardiology

## 2012-05-23 ENCOUNTER — Encounter: Payer: Self-pay | Admitting: Cardiology

## 2012-05-23 VITALS — BP 118/84 | HR 93 | Ht 63.0 in | Wt 166.0 lb

## 2012-05-23 DIAGNOSIS — I341 Nonrheumatic mitral (valve) prolapse: Secondary | ICD-10-CM

## 2012-05-23 DIAGNOSIS — R002 Palpitations: Secondary | ICD-10-CM

## 2012-05-23 DIAGNOSIS — I059 Rheumatic mitral valve disease, unspecified: Secondary | ICD-10-CM

## 2012-05-23 DIAGNOSIS — R0602 Shortness of breath: Secondary | ICD-10-CM

## 2012-05-23 DIAGNOSIS — I340 Nonrheumatic mitral (valve) insufficiency: Secondary | ICD-10-CM

## 2012-05-23 NOTE — Patient Instructions (Addendum)
The current medical regimen is effective;  continue present plan and medications.  Your physician has recommended that you have a cardiopulmonary stress test (CPX). CPX testing is a non-invasive measurement of heart and lung function. It replaces a traditional treadmill stress test. This type of test provides a tremendous amount of information that relates not only to your present condition but also for future outcomes. This test combines measurements of you ventilation, respiratory gas exchange in the lungs, electrocardiogram (EKG), blood pressure and physical response before, during, and following an exercise protocol.  Follow up with Dr Antoine Poche in April 2014.  Your physician has discussed with you the possibility of needing  a TEE. During a TEE, sound waves are used to create images of your heart. It provides your doctor with information about the size and shape of your heart and how well your heart's chambers and valves are working. In this test, a transducer is attached to the end of a flexible tube that's guided down your throat and into your esophagus (the tube leading from you mouth to your stomach) to get a more detailed image of your heart. You are not awake for the procedure. Please see the instruction sheet given to you today. For further information please visit [email or web address]  Please sign up for My Chart.  This will allow you to review your records and results.  Instructions are included.
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
## 2012-06-18 ENCOUNTER — Ambulatory Visit (HOSPITAL_COMMUNITY): Payer: BC Managed Care – PPO | Attending: Cardiology

## 2012-06-18 DIAGNOSIS — R002 Palpitations: Secondary | ICD-10-CM | POA: Insufficient documentation

## 2012-06-18 DIAGNOSIS — R0602 Shortness of breath: Secondary | ICD-10-CM | POA: Insufficient documentation

## 2012-07-05 ENCOUNTER — Telehealth: Payer: Self-pay | Admitting: Cardiology

## 2012-07-05 NOTE — Telephone Encounter (Signed)
New Problem:    Patient called in wanting to know if the results of her CPX from 06/18/12 were back ywet.  Please call back.

## 2012-07-05 NOTE — Telephone Encounter (Signed)
Reviewed results with pt  Scheduled her for follow up appt 1/14

## 2012-07-17 ENCOUNTER — Ambulatory Visit (INDEPENDENT_AMBULATORY_CARE_PROVIDER_SITE_OTHER): Payer: BC Managed Care – PPO | Admitting: Cardiology

## 2012-07-17 ENCOUNTER — Encounter: Payer: Self-pay | Admitting: Cardiology

## 2012-07-17 VITALS — BP 132/88 | HR 95 | Ht 63.0 in | Wt 165.0 lb

## 2012-07-17 DIAGNOSIS — R002 Palpitations: Secondary | ICD-10-CM

## 2012-07-17 DIAGNOSIS — I059 Rheumatic mitral valve disease, unspecified: Secondary | ICD-10-CM

## 2012-07-17 DIAGNOSIS — I340 Nonrheumatic mitral (valve) insufficiency: Secondary | ICD-10-CM

## 2012-07-17 DIAGNOSIS — R000 Tachycardia, unspecified: Secondary | ICD-10-CM

## 2012-07-17 NOTE — Patient Instructions (Addendum)
The current medical regimen is effective;  continue present plan and medications.  Your physician has requested that you have an echocardiogram. Echocardiography is a painless test that uses sound waves to create images of your heart. It provides your doctor with information about the size and shape of your heart and how well your heart's chambers and valves are working. This procedure takes approximately one hour. There are no restrictions for this procedure.  Follow up with Dr Antoine Poche in May 2014 after your echocardiogram.
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
## 2012-08-10 ENCOUNTER — Ambulatory Visit: Payer: BC Managed Care – PPO | Admitting: Internal Medicine

## 2012-08-13 ENCOUNTER — Ambulatory Visit (INDEPENDENT_AMBULATORY_CARE_PROVIDER_SITE_OTHER): Payer: BC Managed Care – PPO | Admitting: Internal Medicine

## 2012-08-13 VITALS — BP 120/82 | HR 106 | Temp 98.7°F | Resp 16 | Wt 167.0 lb

## 2012-08-13 DIAGNOSIS — Z800 Family history of malignant neoplasm of digestive organs: Secondary | ICD-10-CM | POA: Insufficient documentation

## 2012-08-13 NOTE — Progress Notes (Signed)
  Subjective:    Patient ID: [name], female    DOB: 1976/03/14, 37 y.o.   MRN: [id number]  HPI  She returns and requests that she be tested for [facility] syndrome in light of her history of colon polyps and multiple family members with cancer.  Review of Systems  Constitutional: Negative.   HENT: Negative.   Eyes: Negative.   Respiratory: Negative.   Cardiovascular: Negative.   Gastrointestinal: Negative.   Endocrine: Negative.   Genitourinary: Negative.   Musculoskeletal: Negative.   Skin: Negative.   Allergic/Immunologic: Negative.   Neurological: Negative.   Hematological: Negative.   Psychiatric/Behavioral: Negative.        Objective:   Physical Exam  Vitals reviewed. Constitutional: She appears well-developed and well-nourished. No distress.  HENT:  Head: Normocephalic and atraumatic.  Mouth/Throat: Oropharynx is clear and moist. No oropharyngeal exudate.  Eyes: Conjunctivae are normal. Right eye exhibits no discharge. Left eye exhibits no discharge. No scleral icterus.  Neck: Normal range of motion. Neck supple. No JVD present. No tracheal deviation present. No thyromegaly present.  Cardiovascular: Normal rate, regular rhythm, S1 normal, S2 normal and intact distal pulses.  Exam reveals no gallop and no friction rub.   Murmur heard.  Decrescendo systolic murmur is present with a grade of 1/6  Pulmonary/Chest: Effort normal and breath sounds normal. No stridor. No respiratory distress. She has no wheezes. She has no rales. She exhibits no tenderness.  Abdominal: Soft. Bowel sounds are normal. She exhibits no distension and no mass. There is no tenderness. There is no rebound and no guarding.  Musculoskeletal: Normal range of motion. She exhibits no edema and no tenderness.  Lymphadenopathy:    She has no cervical adenopathy.  Skin: Skin is warm and dry. No rash noted. She is not diaphoretic. No erythema. No pallor.  Psychiatric: She has a normal mood and  affect. Her behavior is normal. Judgment and thought content normal.     Lab Results  Component Value Date   WBC 7.6 05/15/2012   HGB 15.0 05/15/2012   HCT 43.9 05/15/2012   PLT 328 05/15/2012   GLUCOSE 89 05/15/2012   CHOL 187 12/26/2011   TRIG 79 12/26/2011   HDL 51 12/26/2011   LDLCALC 120 12/26/2011   ALT 20 01/23/2012   AST 15 01/23/2012   NA 138 05/15/2012   K 3.8 05/15/2012   CL 98 05/15/2012   CREATININE 0.97 05/15/2012   BUN 11 05/15/2012   CO2 28 05/15/2012   TSH 2.131 12/26/2011       Assessment & Plan:

## 2012-08-14 ENCOUNTER — Encounter: Payer: Self-pay | Admitting: Internal Medicine

## 2012-08-14 ENCOUNTER — Telehealth: Payer: Self-pay | Admitting: Genetic Counselor

## 2012-08-14 NOTE — Telephone Encounter (Signed)
LVOM for pt to return call.  °

## 2012-08-14 NOTE — Assessment & Plan Note (Signed)
I have asked her to see oncology to inquire about testing for [facility] syndrome

## 2012-08-18 ENCOUNTER — Other Ambulatory Visit: Payer: Self-pay

## 2012-09-12 ENCOUNTER — Telehealth: Payer: Self-pay | Admitting: Cardiology

## 2012-09-12 NOTE — Telephone Encounter (Signed)
Pt called to report to Dr. Antoine Poche that she has had 2-3 episodes of dizziness since July 18, 2012.  She said she just wanted him to know.

## 2012-09-12 NOTE — Telephone Encounter (Signed)
Pt calling re being dizzy, off and on since 07-18-12, heart racing, felt faint twice

## 2012-09-14 NOTE — Telephone Encounter (Signed)
Spoke with patient.

## 2012-09-14 NOTE — Telephone Encounter (Signed)
Pt states the day after she saw Dr Antoine Poche in January she had a episode of lightheadedness while sitting down doing computer work. It lasted a few minutes and she did not feel like she was going to pass out. She has had a couple of similar episodes since then,  the last one was a week ago. She is currently asymptomatic and denies any vision or speech problems, muscle weakness, or headache. I offered her an appt Monday 09/17/12 but pt is unable to come at available time on Monday. I have given her an appt 09/18/12 with Brynda Rim.

## 2012-09-14 NOTE — Telephone Encounter (Signed)
Pt calling because she has not heard anything about what Dr. Antoine Poche thinks about her symptoms or if she needs to do something regarding this she has not had another episode that severe but family is concerned and wants to know what to do

## 2012-09-18 ENCOUNTER — Ambulatory Visit (INDEPENDENT_AMBULATORY_CARE_PROVIDER_SITE_OTHER): Payer: BC Managed Care – PPO | Admitting: Physician Assistant

## 2012-09-18 ENCOUNTER — Encounter: Payer: Self-pay | Admitting: Physician Assistant

## 2012-09-18 VITALS — BP 126/87 | HR 110 | Ht 63.0 in | Wt 167.0 lb

## 2012-09-18 DIAGNOSIS — R42 Dizziness and giddiness: Secondary | ICD-10-CM

## 2012-09-18 DIAGNOSIS — R002 Palpitations: Secondary | ICD-10-CM

## 2012-09-18 LAB — CBC WITH DIFFERENTIAL/PLATELET
Eosinophils Relative: 1.3 % (ref 0.0–5.0)
HCT: 39.6 % (ref 36.0–46.0)
Hemoglobin: 13 g/dL (ref 12.0–15.0)
Lymphs Abs: 1.2 10*3/uL (ref 0.7–4.0)
Monocytes Relative: 6.6 % (ref 3.0–12.0)
Neutro Abs: 4.1 10*3/uL (ref 1.4–7.7)
RBC: 4.52 Mil/uL (ref 3.87–5.11)
WBC: 5.8 10*3/uL (ref 4.5–10.5)

## 2012-09-18 LAB — BASIC METABOLIC PANEL
GFR: 91.2 mL/min (ref >60.00)
Potassium: 3.9 mEq/L (ref 3.5–5.1)
Sodium: 136 mEq/L (ref 135–145)

## 2012-09-18 LAB — TSH: TSH: 1.19 u[IU]/mL (ref 0.35–5.50)

## 2012-09-18 NOTE — Patient Instructions (Addendum)
Your physician has recommended that you wear a holter monitor. Holter monitors are medical devices that record the heart's electrical activity. Doctors most often use these monitors to diagnose arrhythmias. Arrhythmias are problems with the speed or rhythm of the heartbeat. The monitor is a small, portable device. You can wear one while you do your normal daily activities. This is usually used to diagnose what is causing palpitations/syncope (passing out).  Your physician recommends that you return for lab work in: today (bmet, cbc, tsh)  Wear your compression stockings every day  Drink plenty of fluids  Your physician recommends that you schedule a follow-up appointment in:  May, 1-2 weeks after your echocardiogram with Dr Antoine Poche
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
## 2012-09-21 ENCOUNTER — Telehealth: Payer: Self-pay | Admitting: *Deleted

## 2012-09-21 NOTE — Telephone Encounter (Signed)
lmom labs ok 

## 2012-09-27 ENCOUNTER — Ambulatory Visit (INDEPENDENT_AMBULATORY_CARE_PROVIDER_SITE_OTHER): Payer: BC Managed Care – PPO

## 2012-09-27 DIAGNOSIS — R002 Palpitations: Secondary | ICD-10-CM

## 2012-09-27 DIAGNOSIS — R42 Dizziness and giddiness: Secondary | ICD-10-CM

## 2012-09-28 NOTE — Progress Notes (Signed)
Placed a 24 hr  monitor and went over instructions on how to use it and when to return it.  

## 2012-10-22 ENCOUNTER — Ambulatory Visit (HOSPITAL_BASED_OUTPATIENT_CLINIC_OR_DEPARTMENT_OTHER): Payer: BC Managed Care – PPO | Admitting: Genetic Counselor

## 2012-10-22 ENCOUNTER — Other Ambulatory Visit: Payer: BC Managed Care – PPO | Admitting: Lab

## 2012-10-22 ENCOUNTER — Encounter: Payer: Self-pay | Admitting: Genetic Counselor

## 2012-10-22 DIAGNOSIS — Z800 Family history of malignant neoplasm of digestive organs: Secondary | ICD-10-CM
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
## 2012-10-31 ENCOUNTER — Telehealth: Payer: Self-pay

## 2012-10-31 ENCOUNTER — Encounter: Payer: Self-pay | Admitting: Internal Medicine

## 2012-10-31 ENCOUNTER — Ambulatory Visit (INDEPENDENT_AMBULATORY_CARE_PROVIDER_SITE_OTHER): Payer: BC Managed Care – PPO | Admitting: Internal Medicine

## 2012-10-31 ENCOUNTER — Ambulatory Visit (INDEPENDENT_AMBULATORY_CARE_PROVIDER_SITE_OTHER)
Admission: RE | Admit: 2012-10-31 | Discharge: 2012-10-31 | Disposition: A | Discharge status: Home / Self Care | Payer: BC Managed Care – PPO | Source: Ambulatory Visit | Attending: Internal Medicine | Admitting: Internal Medicine

## 2012-10-31 VITALS — BP 118/64 | HR 113 | Temp 98.2°F | Resp 16 | Wt 165.0 lb

## 2012-10-31 DIAGNOSIS — R05 Cough: Secondary | ICD-10-CM

## 2012-10-31 DIAGNOSIS — R059 Cough, unspecified: Secondary | ICD-10-CM

## 2012-10-31 DIAGNOSIS — J209 Acute bronchitis, unspecified: Secondary | ICD-10-CM

## 2012-10-31 MED ORDER — HYDROCOD POLST-CPM POLST ER 10-8 MG PO CP12: 1.0000 | ORAL_CAPSULE | Freq: Two times a day (BID) | ORAL | Status: DC | PRN

## 2012-10-31 MED ORDER — AZITHROMYCIN 500 MG PO TABS: 500.0000 mg | ORAL_TABLET | Freq: Every day | ORAL | Status: DC

## 2012-10-31 MED ORDER — FLUCONAZOLE 150 MG PO TABS: 150.0000 mg | ORAL_TABLET | Freq: Once | ORAL | Status: DC

## 2012-10-31 NOTE — Progress Notes (Signed)
Subjective:    Patient ID: [name], female    DOB: 1976/06/08, 37 y.o.   MRN: [id number]  Cough This is a new problem. The current episode started in the past 7 days. The problem has been unchanged. The problem occurs every few hours. The cough is productive of purulent sputum. Associated symptoms include chills and a sore throat. Pertinent negatives include no chest pain, ear congestion, ear pain, fever, headaches, heartburn, hemoptysis, myalgias, nasal congestion, postnasal drip, rash, rhinorrhea, shortness of breath, sweats, weight loss or wheezing. Nothing aggravates the symptoms. She has tried OTC cough suppressant for the symptoms. The treatment provided mild relief. There is no history of asthma, bronchiectasis, bronchitis, COPD, emphysema, environmental allergies or pneumonia.      Review of Systems  Constitutional: Positive for chills. Negative for fever, weight loss, diaphoresis, activity change, appetite change, fatigue and unexpected weight change.  HENT: Positive for sore throat. Negative for ear pain, rhinorrhea, mouth sores, trouble swallowing, voice change and postnasal drip.   Eyes: Negative.   Respiratory: Positive for cough. Negative for hemoptysis, shortness of breath and wheezing.   Cardiovascular: Negative.  Negative for chest pain, palpitations and leg swelling.  Gastrointestinal: Negative.  Negative for heartburn, nausea, vomiting, abdominal pain, diarrhea and constipation.  Endocrine: Negative.   Genitourinary: Negative.   Musculoskeletal: Negative.  Negative for myalgias.  Skin: Negative.  Negative for rash.  Allergic/Immunologic: Negative.  Negative for environmental allergies.  Neurological: Negative.  Negative for dizziness, tremors, weakness, light-headedness and headaches.  Hematological: Negative for adenopathy. Does not bruise/bleed easily.  Psychiatric/Behavioral: Negative.        Objective:   Physical Exam  Vitals  reviewed. Constitutional: She is oriented to person, place, and time. She appears well-developed and well-nourished.  Non-toxic appearance. She does not have a sickly appearance. She does not appear ill. No distress.  HENT:  Head: Normocephalic and atraumatic.  Mouth/Throat: Oropharynx is clear and moist. No oropharyngeal exudate.  Eyes: Conjunctivae are normal. Right eye exhibits no discharge. Left eye exhibits no discharge. No scleral icterus.  Neck: Normal range of motion. Neck supple. No JVD present. No tracheal deviation present. No thyromegaly present.  Cardiovascular: Normal rate, regular rhythm, normal heart sounds and intact distal pulses.  Exam reveals no gallop and no friction rub.   No murmur heard. Pulmonary/Chest: Effort normal and breath sounds normal. No stridor. No respiratory distress. She has no wheezes. She has no rales. She exhibits no tenderness.  Abdominal: Soft. Bowel sounds are normal. She exhibits no distension and no mass. There is no tenderness. There is no rebound and no guarding.  Musculoskeletal: Normal range of motion. She exhibits no edema and no tenderness.  Lymphadenopathy:    She has no cervical adenopathy.  Neurological: She is oriented to person, place, and time.  Skin: Skin is warm and dry. No rash noted. She is not diaphoretic. No erythema. No pallor.  Psychiatric: She has a normal mood and affect. Her behavior is normal. Judgment and thought content normal.     Lab Results  Component Value Date   WBC 5.8 09/18/2012   HGB 13.0 09/18/2012   HCT 39.6 09/18/2012   PLT 258.0 09/18/2012   GLUCOSE 104* 09/18/2012   CHOL 187 12/26/2011   TRIG 79 12/26/2011   HDL 51 12/26/2011   LDLCALC 120 12/26/2011   ALT 20 01/23/2012   AST 15 01/23/2012   NA 136 09/18/2012   K 3.9 09/18/2012   CL 105 09/18/2012   CREATININE 0.9  09/18/2012   BUN 7 09/18/2012   CO2 26 09/18/2012   TSH 1.19 09/18/2012       Assessment & Plan:

## 2012-10-31 NOTE — Patient Instructions (Signed)

## 2012-10-31 NOTE — Telephone Encounter (Signed)
Phone call from pt requesting her cxr results from yesterday. I let her know it is normal. No other concerns at this time

## 2012-11-01 ENCOUNTER — Ambulatory Visit (HOSPITAL_COMMUNITY): Payer: BC Managed Care – PPO | Attending: Cardiology | Admitting: Radiology

## 2012-11-01 DIAGNOSIS — I059 Rheumatic mitral valve disease, unspecified: Secondary | ICD-10-CM

## 2012-11-01 DIAGNOSIS — R42 Dizziness and giddiness: Secondary | ICD-10-CM

## 2012-11-01 DIAGNOSIS — I340 Nonrheumatic mitral (valve) insufficiency: Secondary | ICD-10-CM

## 2012-11-01 NOTE — Progress Notes (Signed)
Echocardiogram performed.  

## 2012-11-02 NOTE — Assessment & Plan Note (Signed)
I will check her CXR to see if she has pneumonia

## 2012-11-02 NOTE — Assessment & Plan Note (Signed)
Start zpak for the infection and a cough suppressant 

## 2012-11-06 ENCOUNTER — Encounter: Payer: Self-pay | Admitting: Cardiology

## 2012-11-06 ENCOUNTER — Ambulatory Visit (INDEPENDENT_AMBULATORY_CARE_PROVIDER_SITE_OTHER): Payer: BC Managed Care – PPO | Admitting: Cardiology

## 2012-11-06 VITALS — BP 125/84 | HR 105 | Ht 63.0 in | Wt 163.0 lb

## 2012-11-06 DIAGNOSIS — I059 Rheumatic mitral valve disease, unspecified: Secondary | ICD-10-CM

## 2012-11-06 DIAGNOSIS — I341 Nonrheumatic mitral (valve) prolapse: Secondary | ICD-10-CM

## 2012-11-06 DIAGNOSIS — I340 Nonrheumatic mitral (valve) insufficiency: Secondary | ICD-10-CM

## 2012-11-06 NOTE — Patient Instructions (Addendum)
The current medical regimen is effective;  continue present plan and medications.  Follow up in 6 months with Dr Hochrein.  You will receive a letter in the mail 2 months before you are due.  Please call us when you receive this letter to schedule your follow up appointment.  
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
## 2012-11-22 ENCOUNTER — Encounter: Payer: Self-pay | Admitting: *Deleted

## 2012-11-22 NOTE — Progress Notes (Signed)
Per Maylon Cos - schedule pt on 11/23/12 at 11am.

## 2012-11-23 ENCOUNTER — Ambulatory Visit (HOSPITAL_BASED_OUTPATIENT_CLINIC_OR_DEPARTMENT_OTHER): Payer: BC Managed Care – PPO | Admitting: Genetic Counselor

## 2012-11-23 DIAGNOSIS — Z800 Family history of malignant neoplasm of digestive organs: Secondary | ICD-10-CM

## 2012-11-23 DIAGNOSIS — Z1509 Genetic susceptibility to other malignant neoplasm: Secondary | ICD-10-CM
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
## 2012-11-28 ENCOUNTER — Ambulatory Visit (INDEPENDENT_AMBULATORY_CARE_PROVIDER_SITE_OTHER): Payer: BC Managed Care – PPO | Admitting: Gastroenterology

## 2012-11-28 ENCOUNTER — Encounter: Payer: Self-pay | Admitting: Gastroenterology

## 2012-11-28 VITALS — BP 110/80 | HR 100 | Ht 62.75 in | Wt 166.0 lb

## 2012-11-28 DIAGNOSIS — Z1509 Genetic susceptibility to other malignant neoplasm: Secondary | ICD-10-CM | POA: Insufficient documentation

## 2012-11-28 DIAGNOSIS — Z800 Family history of malignant neoplasm of digestive organs: Secondary | ICD-10-CM

## 2012-11-28 MED ORDER — PEG-KCL-NACL-NASULF-NA ASC-C 100 G PO SOLR: 1.0000 | Freq: Once | ORAL | Status: DC

## 2012-11-28 NOTE — Patient Instructions (Addendum)
You have been scheduled for a colonoscopy with propofol. Please follow written instructions given to you at your visit today.  Please pick up your prep kit at the pharmacy within the next 1-3 days. If you use inhalers (even only as needed), please bring them with you on the day of your procedure. Your physician has requested that you go to www.startemmi.com and enter the access code given to you at your visit today. This web site gives a general overview about your procedure. However, you should still follow specific instructions given to you by our office regarding your preparation for the procedure.  Thank you for choosing me and [facility].  Malcolm T. Stark, Jr., MD., FACG  

## 2012-11-28 NOTE — Progress Notes (Signed)
History of Present Illness: This is a 37 year old female with a recent diagnosis of Lynch syndrome confirmed by genetic testing. She previously underwent colonoscopy in August 2011 for family history of colon cancer in a paternal aunt at age 53. That procedure showed small hyperplastic colon polyps. Since that time her father at age 22 and 2 paternal uncles, ages 26 and 29, were diagnosed with colon cancer. She underwent genetic counseling and genetic testing confirming the PMS2 mutation associated with Lynch syndrome. She has occasional alternating diarrhea and constipation and has occasional anal discomfort felt secondary to hemorrhoids. She underwent hemorrhoidal injection for bleeding at the time of her last colonoscopy. Denies weight loss, abdominal pain, change in stool caliber, melena, hematochezia, nausea, vomiting, dysphagia, reflux symptoms, chest pain.  Current Medications, Allergies, Past Medical History, Past Surgical History, Family History and Social History were reviewed in [employer] record.  Physical Exam: General: Well developed , well nourished, no acute distress Head: Normocephalic and atraumatic Eyes:  sclerae anicteric, EOMI Ears: Normal auditory acuity Mouth: No deformity or lesions Lungs: Clear throughout to auscultation Heart: Regular rate and rhythm; no murmurs, rubs or bruits Abdomen: Soft, non tender and non distended. No masses, hepatosplenomegaly or hernias noted. Normal Bowel sounds Rectal: Deferred to colonoscopy Musculoskeletal: Symmetrical with no gross deformities  Pulses:  Normal pulses noted Extremities: No clubbing, cyanosis, edema or deformities noted Neurological: Alert oriented x 4, grossly nonfocal Psychological:  Alert and cooperative. Normal mood and affect  Assessment and Recommendations:  1. Lynch syndrome. National guidelines recommend colonoscopies starting at age 66-25, every 1-2 years. Given her age and the ages of onset  of colon cancer in her family, she should now be on an every year colonoscopy schedule. Schedule colonoscopy. The risks, benefits, and alternatives to colonoscopy with possible biopsy and possible polypectomy were discussed with the patient and they consent to proceed.   2. Suspected irritable bowel syndrome. Trial of increasing dietary fiber and water intake. Consider a trial of a daily probiotic. Avoid foods that trigger symptoms.   3. History of external and internal hemorrhoids. Standard rectal care instructions and over-the-counter creams and suppositories as needed.

## 2012-11-28 NOTE — Progress Notes (Deleted)
History of Present Illness: This is a   Current Medications, Allergies, Past Medical History, Past Surgical History, Family History and Social History were reviewed in [employer] record.  Physical Exam: General: Well developed , well nourished, no acute distress Head: Normocephalic and atraumatic Eyes:  sclerae anicteric, EOMI Ears: Normal auditory acuity Mouth: No deformity or lesions Lungs: Clear throughout to auscultation Heart: Regular rate and rhythm; no murmurs, rubs or bruits Abdomen: Soft, non tender and non distended. No masses, hepatosplenomegaly or hernias noted. Normal Bowel sounds Rectal: deferred to colonoscopy Musculoskeletal: Symmetrical with no gross deformities  Pulses:  Normal pulses noted Extremities: No clubbing, cyanosis, edema or deformities noted Neurological: Alert oriented x 4, grossly nonfocal Psychological:  Alert and cooperative. Normal mood and affect  Assessment and Recommendations:

## 2012-11-29 ENCOUNTER — Encounter: Payer: Self-pay | Admitting: Gastroenterology

## 2012-12-02 DIAGNOSIS — D126 Benign neoplasm of colon, unspecified: Secondary | ICD-10-CM

## 2012-12-02 HISTORY — DX: Benign neoplasm of colon, unspecified: D12.6

## 2012-12-04 ENCOUNTER — Encounter: Payer: Self-pay | Admitting: Genetic Counselor

## 2012-12-06 ENCOUNTER — Ambulatory Visit (AMBULATORY_SURGERY_CENTER): Payer: BC Managed Care – PPO | Admitting: Gastroenterology

## 2012-12-06 ENCOUNTER — Encounter: Payer: Self-pay | Admitting: Gastroenterology

## 2012-12-06 VITALS — BP 132/87 | HR 78 | Temp 98.3°F | Resp 17 | Ht 62.0 in | Wt 166.0 lb

## 2012-12-06 DIAGNOSIS — Z800 Family history of malignant neoplasm of digestive organs: Secondary | ICD-10-CM

## 2012-12-06 DIAGNOSIS — Z1211 Encounter for screening for malignant neoplasm of colon: Secondary | ICD-10-CM

## 2012-12-06 DIAGNOSIS — D126 Benign neoplasm of colon, unspecified: Secondary | ICD-10-CM

## 2012-12-06 DIAGNOSIS — Z1509 Genetic susceptibility to other malignant neoplasm: Secondary | ICD-10-CM

## 2012-12-06 MED ORDER — SODIUM CHLORIDE 0.9 % IV SOLN: 500.0000 mL | INTRAVENOUS | Status: DC

## 2012-12-06 NOTE — Progress Notes (Signed)
Report to pacu rn, vss, bbs=clear 

## 2012-12-06 NOTE — Progress Notes (Signed)
Called to room to assist during endoscopic procedure.  Patient ID and intended procedure confirmed with present staff. Received instructions for my participation in the procedure from the performing physician.  

## 2012-12-06 NOTE — Op Note (Signed)
[facility] [address] [place], [address]   COLONOSCOPY PROCEDURE REPORT  PATIENT: [name], [name]  MR#: [id number] BIRTHDATE: 12/19/1975 , 37  yrs. old GENDER: Female ENDOSCOPIST: Meryl Dare, MD, [facility] PROCEDURE DATE:  12/06/2012 PROCEDURE:   Colonoscopy with snare polypectomy ASA CLASS:   Class II INDICATIONS:elevated risk screening, patient's immediate family history of colon cancer, patient's family history of colon cancer, distant relatives, Lynch syndrome, Last colonoscopy performed 3 years ago. MEDICATIONS: MAC sedation, administered by CRNA and propofol (Diprivan) 300mg  IV DESCRIPTION OF PROCEDURE:   After the risks benefits and alternatives of the procedure were thoroughly explained, informed consent was obtained.  A digital rectal exam revealed no abnormalities of the rectum.   The [id number]  endoscope was introduced through the anus and advanced to the cecum, which was identified by both the appendix and ileocecal valve. No adverse events experienced.   The quality of the prep was excellent, using MoviPrep  The instrument was then slowly withdrawn as the colon was fully examined.  COLON FINDINGS: Two sessile polyps measuring 4-7 mm in size were found in the transverse colon.  A polypectomy was performed with a cold snare.  The resection was complete and the polyp tissue was completely retrieved.   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   The colon was otherwise normal.  There was no diverticulosis, inflammation, polyps or cancers unless previously stated.  Retroflexed views revealed small internal hemorrhoids. The time to cecum=2 minutes 51 seconds.  Withdrawal time=14 minutes 18 seconds.  The scope was withdrawn and the procedure completed.  COMPLICATIONS: There were no complications.  ENDOSCOPIC IMPRESSION: 1.   Two sessile polyps measuring 4-7 mm in the transverse colon; polypectomy performed with a cold  snare 2.   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   Small internal hemorrhoids  RECOMMENDATIONS: 1.  Await pathology results 2.  Repeat Colonoscopy in 1 year.  eSigned:  Meryl Dare, MD, [facility] 12/06/2012 9:34 AM

## 2012-12-06 NOTE — Patient Instructions (Addendum)

## 2012-12-06 NOTE — Progress Notes (Signed)
Patient did not experience any of the following events: a burn prior to discharge; a fall within the facility; wrong site/side/patient/procedure/implant event; or a hospital transfer or hospital admission upon discharge from the facility. (G8907) Patient did not have preoperative order for IV antibiotic SSI prophylaxis. (G8918)  

## 2012-12-07 ENCOUNTER — Telehealth: Payer: Self-pay | Admitting: *Deleted

## 2012-12-07 NOTE — Telephone Encounter (Signed)
No answer, left message to call if questions or concerns. 

## 2012-12-16 ENCOUNTER — Encounter: Payer: Self-pay | Admitting: Gastroenterology

## 2012-12-20 LAB — HM COLONOSCOPY

## 2013-05-09 ENCOUNTER — Other Ambulatory Visit: Payer: Self-pay

## 2013-05-28 ENCOUNTER — Encounter (HOSPITAL_BASED_OUTPATIENT_CLINIC_OR_DEPARTMENT_OTHER): Payer: Self-pay | Admitting: *Deleted

## 2013-05-29 ENCOUNTER — Ambulatory Visit (INDEPENDENT_AMBULATORY_CARE_PROVIDER_SITE_OTHER): Payer: BC Managed Care – PPO | Admitting: Nurse Practitioner

## 2013-05-29 ENCOUNTER — Encounter: Payer: Self-pay | Admitting: Nurse Practitioner

## 2013-05-29 VITALS — BP 120/60 | HR 87 | Ht 63.0 in | Wt 166.4 lb

## 2013-05-29 DIAGNOSIS — I341 Nonrheumatic mitral (valve) prolapse: Secondary | ICD-10-CM

## 2013-05-29 DIAGNOSIS — R002 Palpitations: Secondary | ICD-10-CM

## 2013-05-29 DIAGNOSIS — I059 Rheumatic mitral valve disease, unspecified: Secondary | ICD-10-CM

## 2013-05-29 NOTE — Patient Instructions (Addendum)
Stay on your current medicines  We are going to arrange for a stress echocardiogram - based on these results - may need to send you back to see Dr. Cornelius Moras  Call the [facility] office at [phone] if you have any questions, problems or concerns.
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
## 2013-06-05 ENCOUNTER — Ambulatory Visit (HOSPITAL_BASED_OUTPATIENT_CLINIC_OR_DEPARTMENT_OTHER): Payer: BC Managed Care – PPO

## 2013-06-05 ENCOUNTER — Ambulatory Visit (HOSPITAL_COMMUNITY): Payer: BC Managed Care – PPO | Attending: Nurse Practitioner | Admitting: Radiology

## 2013-06-05 DIAGNOSIS — I341 Nonrheumatic mitral (valve) prolapse: Secondary | ICD-10-CM

## 2013-06-05 DIAGNOSIS — R0989 Other specified symptoms and signs involving the circulatory and respiratory systems: Secondary | ICD-10-CM

## 2013-06-05 DIAGNOSIS — R072 Precordial pain: Secondary | ICD-10-CM

## 2013-06-05 DIAGNOSIS — I059 Rheumatic mitral valve disease, unspecified: Secondary | ICD-10-CM | POA: Insufficient documentation

## 2013-06-05 DIAGNOSIS — R002 Palpitations: Secondary | ICD-10-CM

## 2013-06-05 NOTE — Progress Notes (Signed)
Stress Echocardiogram performed.  

## 2013-06-06 ENCOUNTER — Telehealth: Payer: Self-pay | Admitting: Cardiology

## 2013-06-06 NOTE — Telephone Encounter (Signed)
Looks to me like this has not been read yet.

## 2013-06-06 NOTE — Telephone Encounter (Signed)
Follow up    Pt want stress echo results

## 2013-06-06 NOTE — Telephone Encounter (Signed)
Left message for pt that she has been cleared for surgery.  If she needs clearance faxed anywhere I requested she call back with that number.

## 2013-06-06 NOTE — H&P (Signed)
  [facility] [address]   [address] [place], [place] [address] [phone] A Division of [facility]  Loreta Ave, M.D.   Robert A. Thurston Hole, M.D.   Burnell Blanks, M.D.   Eulas Post, M.D.   Lunette Stands, M.D Jewel Baize. Eulah Pont, M.D.  Buford Dresser, M.D.  Charlsie Quest, M.D.  Estell Harpin, M.D.   Melina Fiddler, M.D. Kirstin A. Shepperson, PA-C  Josh [place], PA-C [place], [place]   RE: [place], [place]   [id number]      DOB: [birth year] PROGRESS NOTE: 02-20-13 Reason for visit:  Left knee pain. History of present illness:  [name] has a complicated history.  She has had over a year of left knee pain acutely and episodes of left knee pain prior to that.  Previously she has undergone in 2008 a lateral release of her left knee.  She has had pain down the outside of the knee ever since then.  She has had MRI as well as EMG's of her knee.  MRI results I do not have today.  The EMG results hve been negative for her despite occasionally having numbness radiating down to her toes.  She does feel fullness most notably in the back of her knee.  She denies any significant swelling right now but does occasionally have swelling.  She does have some crepitus behind the patella.  She feels as though her knee has never gotten better.  She has had it injected x 3.  All of these have helped but the last one only lasted a few weeks. She is here for what effectively is her third opinion.      Please see associated documentation for this clinic visit for further past medical, family, surgical and social history, review of systems, and exam findings as this was reviewed by me.  Her history is notable for mitral valve prolapse and coronary artery disease.    EXAMINATION: Well appearing female no apparent distress.  On the left lower extremity she has well arthroscopic incisions. She is distally neurovascularly intact.   She does not have a Tinel's at her peroneal tendon and fibular head.  She does have a mild effusion of the knee.  She is stable to ligamentous exam.  Her patella tracks well.  She has no apprehension sign.  She has normal lift off of the patella.    ASSESSMENT & PLAN: Patient with persistent knee pain and occasional nerve symptoms of the leg.  I feel it is likely that she had pathology within the knee joint that is causing swelling and posterior capsular swelling which is irritating her neurovascularly intact structures back there.  I feel that this is likely the reason for the negative EMG's when she is non-swollen and only transient intermittent neuropathic pain.  I have asked her to bring the most recent MRI of her knee to me so that I can review this.  She will drop it off and I will call her to discuss possible surgical options versus the next clinical appointment.  In the interim,  I will give her a brace, a prescription for Mobic and some home stretching exercises for her knee.    Jewel Baize.  Eulah Pont, M.D.  Electronically verified by Jewel Baize. Eulah Pont, M.D. TDM:gde D 02-20-13 T 02-21-13

## 2013-06-06 NOTE — Telephone Encounter (Signed)
New message    Want stress echo test results---having knee surgery tomorrow and need to know if she is cleared.

## 2013-06-07 ENCOUNTER — Encounter (HOSPITAL_BASED_OUTPATIENT_CLINIC_OR_DEPARTMENT_OTHER)
Admission: RE | Disposition: A | Discharge status: Home / Self Care | Payer: Self-pay | Source: Ambulatory Visit | Attending: Orthopedic Surgery

## 2013-06-07 ENCOUNTER — Encounter (HOSPITAL_BASED_OUTPATIENT_CLINIC_OR_DEPARTMENT_OTHER): Payer: BC Managed Care – PPO | Admitting: Anesthesiology

## 2013-06-07 ENCOUNTER — Ambulatory Visit (HOSPITAL_BASED_OUTPATIENT_CLINIC_OR_DEPARTMENT_OTHER): Payer: BC Managed Care – PPO | Admitting: Anesthesiology

## 2013-06-07 ENCOUNTER — Ambulatory Visit (HOSPITAL_BASED_OUTPATIENT_CLINIC_OR_DEPARTMENT_OTHER)
Admission: RE | Admit: 2013-06-07 | Discharge: 2013-06-07 | Disposition: A | Discharge status: Home / Self Care | Payer: BC Managed Care – PPO | Source: Ambulatory Visit | Attending: Orthopedic Surgery | Admitting: Orthopedic Surgery

## 2013-06-07 ENCOUNTER — Encounter (HOSPITAL_BASED_OUTPATIENT_CLINIC_OR_DEPARTMENT_OTHER): Payer: Self-pay | Admitting: *Deleted

## 2013-06-07 DIAGNOSIS — Z87891 Personal history of nicotine dependence: Secondary | ICD-10-CM | POA: Insufficient documentation

## 2013-06-07 DIAGNOSIS — M6750 Plica syndrome, unspecified knee: Secondary | ICD-10-CM | POA: Insufficient documentation

## 2013-06-07 DIAGNOSIS — K219 Gastro-esophageal reflux disease without esophagitis: Secondary | ICD-10-CM | POA: Insufficient documentation

## 2013-06-07 DIAGNOSIS — M23302 Other meniscus derangements, unspecified lateral meniscus, unspecified knee: Secondary | ICD-10-CM | POA: Insufficient documentation

## 2013-06-07 DIAGNOSIS — M2240 Chondromalacia patellae, unspecified knee: Secondary | ICD-10-CM | POA: Insufficient documentation

## 2013-06-07 HISTORY — PX: KNEE ARTHROSCOPY WITH MEDIAL MENISECTOMY: SHX5651

## 2013-06-07 HISTORY — DX: Plica syndrome, left knee: M67.52

## 2013-06-07 HISTORY — PX: SYNOVECTOMY: SHX5180

## 2013-06-07 LAB — POCT HEMOGLOBIN-HEMACUE: Hemoglobin: 13.8 g/dL (ref 12.0–15.0)

## 2013-06-07 SURGERY — ARTHROSCOPY, KNEE, WITH MEDIAL MENISCECTOMY
Anesthesia: General | Site: Knee | Laterality: Left

## 2013-06-07 MED ORDER — DEXAMETHASONE SODIUM PHOSPHATE 4 MG/ML IJ SOLN
INTRAMUSCULAR | Status: DC | PRN
  Administered 2013-06-07: 10 mg via INTRAVENOUS

## 2013-06-07 MED ORDER — OXYCODONE HCL 5 MG PO TABS: 5.0000 mg | ORAL_TABLET | Freq: Once | ORAL | Status: DC | PRN

## 2013-06-07 MED ORDER — HYDROMORPHONE HCL PF 1 MG/ML IJ SOLN
INTRAMUSCULAR | Status: AC
  Filled 2013-06-07: qty 1

## 2013-06-07 MED ORDER — FENTANYL CITRATE 0.05 MG/ML IJ SOLN
INTRAMUSCULAR | Status: AC
  Filled 2013-06-07: qty 6

## 2013-06-07 MED ORDER — SODIUM CHLORIDE 0.9 % IR SOLN
Status: DC | PRN
  Administered 2013-06-07: 6000 mL

## 2013-06-07 MED ORDER — CHLORHEXIDINE GLUCONATE 4 % EX LIQD: 60.0000 mL | Freq: Once | CUTANEOUS | Status: DC

## 2013-06-07 MED ORDER — PROPOFOL 10 MG/ML IV BOLUS
INTRAVENOUS | Status: AC
  Filled 2013-06-07: qty 20

## 2013-06-07 MED ORDER — FENTANYL CITRATE 0.05 MG/ML IJ SOLN
INTRAMUSCULAR | Status: DC | PRN
  Administered 2013-06-07: 100 ug via INTRAVENOUS

## 2013-06-07 MED ORDER — HYDROMORPHONE HCL PF 1 MG/ML IJ SOLN
0.2500 mg | INTRAMUSCULAR | Status: DC | PRN
  Administered 2013-06-07 (×3): 0.5 mg via INTRAVENOUS

## 2013-06-07 MED ORDER — LIDOCAINE HCL (CARDIAC) 20 MG/ML IV SOLN
INTRAVENOUS | Status: DC | PRN
  Administered 2013-06-07: 100 mg via INTRAVENOUS

## 2013-06-07 MED ORDER — PROPOFOL 10 MG/ML IV BOLUS
INTRAVENOUS | Status: DC | PRN
  Administered 2013-06-07: 200 mg via INTRAVENOUS

## 2013-06-07 MED ORDER — DEXTROSE 5 % IV SOLN
3.0000 g | INTRAVENOUS | Status: AC
  Administered 2013-06-07: 3 g via INTRAVENOUS

## 2013-06-07 MED ORDER — CEFAZOLIN SODIUM 1-5 GM-% IV SOLN
INTRAVENOUS | Status: AC
  Filled 2013-06-07: qty 50

## 2013-06-07 MED ORDER — MIDAZOLAM HCL 5 MG/5ML IJ SOLN
INTRAMUSCULAR | Status: DC | PRN
  Administered 2013-06-07: 2 mg via INTRAVENOUS

## 2013-06-07 MED ORDER — ONDANSETRON HCL 4 MG PO TABS: 4.0000 mg | ORAL_TABLET | Freq: Three times a day (TID) | ORAL | Status: DC | PRN

## 2013-06-07 MED ORDER — ONDANSETRON HCL 4 MG/2ML IJ SOLN
INTRAMUSCULAR | Status: DC | PRN
  Administered 2013-06-07: 4 mg via INTRAVENOUS

## 2013-06-07 MED ORDER — MORPHINE SULFATE 4 MG/ML IJ SOLN
INTRAMUSCULAR | Status: AC
  Filled 2013-06-07: qty 1

## 2013-06-07 MED ORDER — OXYCODONE HCL 5 MG PO TABS: 10.0000 mg | ORAL_TABLET | ORAL | Status: DC | PRN

## 2013-06-07 MED ORDER — ACETAMINOPHEN 500 MG PO TABS
ORAL_TABLET | ORAL | Status: AC
  Filled 2013-06-07: qty 2

## 2013-06-07 MED ORDER — OXYCODONE HCL 5 MG/5ML PO SOLN: 5.0000 mg | Freq: Once | ORAL | Status: DC | PRN

## 2013-06-07 MED ORDER — FENTANYL CITRATE 0.05 MG/ML IJ SOLN: 50.0000 ug | INTRAMUSCULAR | Status: DC | PRN

## 2013-06-07 MED ORDER — LACTATED RINGERS IV SOLN
INTRAVENOUS | Status: DC
Start: 2013-06-07 — End: 2013-06-07
  Administered 2013-06-07: 14:00:00 via INTRAVENOUS
  Administered 2013-06-07: 10 mL/h via INTRAVENOUS

## 2013-06-07 MED ORDER — BUPIVACAINE HCL (PF) 0.25 % IJ SOLN
INTRAMUSCULAR | Status: AC
  Filled 2013-06-07: qty 30

## 2013-06-07 MED ORDER — MIDAZOLAM HCL 2 MG/2ML IJ SOLN
INTRAMUSCULAR | Status: AC
  Filled 2013-06-07: qty 2

## 2013-06-07 MED ORDER — CEFAZOLIN SODIUM 1-5 GM-% IV SOLN
INTRAVENOUS | Status: AC
  Filled 2013-06-07: qty 100

## 2013-06-07 MED ORDER — MIDAZOLAM HCL 2 MG/2ML IJ SOLN: 1.0000 mg | INTRAMUSCULAR | Status: DC | PRN

## 2013-06-07 MED ORDER — ACETAMINOPHEN 500 MG PO TABS
1000.0000 mg | ORAL_TABLET | Freq: Once | ORAL | Status: AC
  Administered 2013-06-07: 1000 mg via ORAL

## 2013-06-07 MED ORDER — DOCUSATE SODIUM 100 MG PO CAPS: 100.0000 mg | ORAL_CAPSULE | Freq: Two times a day (BID) | ORAL | Status: DC

## 2013-06-07 MED ORDER — DEXTROSE-NACL 5-0.45 % IV SOLN: 100.0000 mL/h | INTRAVENOUS | Status: DC

## 2013-06-07 MED ORDER — BUPIVACAINE HCL (PF) 0.25 % IJ SOLN
INTRAMUSCULAR | Status: DC | PRN
  Administered 2013-06-07: 20 mL

## 2013-06-07 MED ORDER — METHYLPREDNISOLONE ACETATE 80 MG/ML IJ SUSP
INTRAMUSCULAR | Status: AC
  Filled 2013-06-07: qty 1

## 2013-06-07 MED ORDER — ONDANSETRON HCL 4 MG/2ML IJ SOLN: 4.0000 mg | Freq: Once | INTRAMUSCULAR | Status: DC | PRN

## 2013-06-07 SURGICAL SUPPLY — 27 items
BANDAGE ELASTIC 6 VELCRO ST LF (GAUZE/BANDAGES/DRESSINGS) ×2 IMPLANT
BLADE CUDA 5.5 (BLADE) IMPLANT
BLADE CUDA GRT WHITE 3.5 (BLADE) IMPLANT
BLADE CUTTER GATOR 3.5 (BLADE) ×2 IMPLANT
BLADE CUTTER MENIS 5.5 (BLADE) IMPLANT
CANISTER SUCT 3000ML (MISCELLANEOUS) IMPLANT
CANISTER SUCT LVC 12 LTR MEDI- (MISCELLANEOUS) ×2 IMPLANT
CHLORAPREP W/TINT 26ML (MISCELLANEOUS) ×2 IMPLANT
CUTTER MENISCUS  4.2MM (BLADE)
CUTTER MENISCUS 4.2MM (BLADE) IMPLANT
DRAPE ARTHROSCOPY W/POUCH 90 (DRAPES) ×2 IMPLANT
DRSG EMULSION OIL 3X3 NADH (GAUZE/BANDAGES/DRESSINGS) ×2 IMPLANT
GLOVE BIO SURGEON STRL SZ7.5 (GLOVE) ×4 IMPLANT
GLOVE BIOGEL PI IND STRL 8 (GLOVE) ×2 IMPLANT
GLOVE BIOGEL PI INDICATOR 8 (GLOVE) ×2
GOWN PREVENTION PLUS XLARGE (GOWN DISPOSABLE) ×4 IMPLANT
KNEE WRAP E Z 3 GEL PACK (MISCELLANEOUS) ×2 IMPLANT
PACK ARTHROSCOPY DSU (CUSTOM PROCEDURE TRAY) ×2 IMPLANT
PACK BASIN DAY SURGERY FS (CUSTOM PROCEDURE TRAY) ×2 IMPLANT
SET ARTHROSCOPY TUBING (MISCELLANEOUS) ×1
SET ARTHROSCOPY TUBING LN (MISCELLANEOUS) ×1 IMPLANT
SPONGE GAUZE 4X4 12PLY (GAUZE/BANDAGES/DRESSINGS) ×4 IMPLANT
SUT ETHILON 3 0 PS 1 (SUTURE) ×2 IMPLANT
TAPE CLOTH 3X10 TAN LF (GAUZE/BANDAGES/DRESSINGS) IMPLANT
TOWEL OR 17X24 6PK STRL BLUE (TOWEL DISPOSABLE) ×2 IMPLANT
WAND STAR VAC 90 (SURGICAL WAND) IMPLANT
WATER STERILE IRR 1000ML POUR (IV SOLUTION) ×2 IMPLANT

## 2013-06-07 NOTE — Transfer of Care (Signed)
Immediate Anesthesia Transfer of Care Note  Patient: [name]  Procedure(s) Performed: Procedure(s): LEFT KNEE ARTHROSCOPY WITH SYNOVECTOMY LIMITED, ARTHROSCOPY KNEE WITH DEBRIDEMENT/SHAVING (CHONDROPLASTY), ARTHROSCOPY KNEE WITH MEDIAL AND LATERAL  MENISECTOMY (Left) SYNOVECTOMY (Left)  Patient Location: PACU  Anesthesia Type:General  Level of Consciousness: sedated and patient cooperative  Airway & Oxygen Therapy: Patient Spontanous Breathing and Patient connected to face mask oxygen  Post-op Assessment: Report given to PACU RN and Post -op Vital signs reviewed and stable  Post vital signs: Reviewed and stable  Complications: No apparent anesthesia complications

## 2013-06-07 NOTE — Op Note (Signed)
06/07/2013  3:29 PM  PATIENT:  [name]    PRE-OPERATIVE DIAGNOSIS:  Left: PLICA, Chondromalcia - Patella, Cyst Meniscus/Discoid/Derangement - Knee   POST-OPERATIVE DIAGNOSIS:  Same  PROCEDURE:  LEFT KNEE ARTHROSCOPY WITH SYNOVECTOMY LIMITED, ARTHROSCOPY KNEE WITH DEBRIDEMENT/SHAVING (CHONDROPLASTY), ARTHROSCOPY KNEE WITH MEDIAL AND LATERAL  MENISECTOMY, SYNOVECTOMY  SURGEON:  Mahogany Torrance, D, MD  ASSISTANT: Janace Litten OPA  ANESTHESIA:   Gen  BLOOD LOSS: min  COMPLICATIONS: None   PREOPERATIVE INDICATIONS:  [name] is a  37 y.o. female with a diagnosis of Left: PLICA, Chondromalcia - Patella, Cyst Meniscus/Discoid/Derangement - Knee  who failed conservative measures and elected for surgical management.    The risks benefits and alternatives were discussed with the patient preoperatively including but not limited to the risks of infection, bleeding, nerve injury, cardiopulmonary complications, the need for revision surgery, among others, and the patient was willing to proceed.  OPERATIVE IMPLANTS: None  OPERATIVE FINDINGS:  Diagnostic Arthroscopy:  articular cartilage: Grade 1-2 patellar chondromalacia Medial meniscus: Intact Lateral meniscus: Small lateral radial meniscal tear Anterior cruciate ligament/PCL: Intact Loose bodies: None Patellar plica noted.    OPERATIVE PROCEDURE:  Patient was identified in the preoperative holding area and site was marked by me female was transported to the operating theater and placed on the table in supine position taking care to pad all bony prominences. After a preincinduction time out anesthesia was induced.  female received Ancef for preoperative antibiotics. The left lower extremity was prepped and draped in normal sterile fashion and a pre-incision timeout was performed.   A small stab incision was made in the anterolateral portal position. The arthroscope was introduced in the joint. A medial portal  was then established under direct visualization just above the anterior horn of the medial meniscus. Diagnostic arthroscopy was then carried out with findings as described above.  I then performed a debridement of the lateral meniscus radial tear to smooth edges. As happy with the stability of the meniscus and it was stable at the roots. I debrided a  Patellar plica on the medial and lateral sides. I also performed a small chondroplasty to the undersurface of the medial side of the patella. Tilt was normal and the patella.  The arthroscopic equipment was removed from the joint and the portals were closed with 3-0 nylon in an interrupted fashion. The knee was infiltrated with Marcaine.  Sterile dressings were then applied including Xeroform 4 x 4's ABDs an ACE bandage.  The patient was then allowed to awaken from general anesthesia, transferred to the stretcher and taken to the recovery room in stable condition.  POSTOPERATIVE PLAN: The patient will be discharged home today and will followup in one week for suture removal and wound check.  VTE prophylaxis: Early ambulation and aspirin 325 daily

## 2013-06-07 NOTE — Anesthesia Preprocedure Evaluation (Signed)
Anesthesia Evaluation  Patient identified by MRN, date of birth, ID band Patient awake    Reviewed: Allergy & Precautions, H&P , NPO status , Patient's Chart, lab work & pertinent test results  Airway Mallampati: I TM Distance: >3 FB Neck ROM: Full    Dental  (+) Teeth Intact and Dental Advisory Given   Pulmonary former smoker,  breath sounds clear to auscultation        Cardiovascular Rhythm:Regular     Neuro/Psych    GI/Hepatic GERD-  Medicated and Controlled,  Endo/Other    Renal/GU      Musculoskeletal   Abdominal   Peds  Hematology   Anesthesia Other Findings   Reproductive/Obstetrics                           Anesthesia Physical Anesthesia Plan  ASA: II  Anesthesia Plan: General   Post-op Pain Management:    Induction: Intravenous  Airway Management Planned: LMA  Additional Equipment:   Intra-op Plan:   Post-operative Plan: Extubation in OR  Informed Consent: I have reviewed the patients History and Physical, chart, labs and discussed the procedure including the risks, benefits and alternatives for the proposed anesthesia with the patient or authorized representative who has indicated his/her understanding and acceptance.   Dental advisory given  Plan Discussed with: CRNA, Anesthesiologist and Surgeon  Anesthesia Plan Comments:         Anesthesia Quick Evaluation

## 2013-06-07 NOTE — Interval H&P Note (Signed)
History and Physical Interval Note:  06/07/2013 12:51 PM  [name]  has presented today for surgery, with the diagnosis of Left: PLICA, Chondromalcia - Patella, Cyst Meniscus/Discoid/Derangement - Knee   The various methods of treatment have been discussed with the patient and family. After consideration of risks, benefits and other options for treatment, the patient has consented to  Procedure(s): LEFT KNEE ARTHROSCOPY WITH SYNOVECTOMY LIMITED, ARTHROSCOPY KNEE WITH DEBRIDEMENT/SHAVING (CHONDROPLASTY), ARTHROSCOPY KNEE WITH MEDIAL AND LATERAL  MENISECTOMY (Left) SYNOVECTOMY (Left) as a surgical intervention .  The patient's history has been reviewed, patient examined, no change in status, stable for surgery.  I have reviewed the patient's chart and labs.  Questions were answered to the patient's satisfaction.     [name], D

## 2013-06-07 NOTE — Anesthesia Procedure Notes (Signed)
Procedure Name: LMA Insertion Date/Time: 06/07/2013 3:06 PM Performed by: Burna Cash Pre-anesthesia Checklist: Patient identified, Emergency Drugs available, Suction available and Patient being monitored Patient Re-evaluated:Patient Re-evaluated prior to inductionOxygen Delivery Method: Circle System Utilized Preoxygenation: Pre-oxygenation with 100% oxygen Intubation Type: IV induction Ventilation: Mask ventilation without difficulty LMA: LMA inserted LMA Size: 4.0 Number of attempts: 1 Airway Equipment and Method: bite block Placement Confirmation: positive ETCO2 Tube secured with: Tape Dental Injury: Teeth and Oropharynx as per pre-operative assessment

## 2013-06-10 ENCOUNTER — Encounter (HOSPITAL_BASED_OUTPATIENT_CLINIC_OR_DEPARTMENT_OTHER): Payer: Self-pay | Admitting: Orthopedic Surgery

## 2013-06-17 NOTE — Anesthesia Postprocedure Evaluation (Signed)
  Anesthesia Post-op Note  Patient: [name]  Procedure(s) Performed: Procedure(s): LEFT KNEE ARTHROSCOPY WITH SYNOVECTOMY LIMITED, ARTHROSCOPY KNEE WITH DEBRIDEMENT/SHAVING (CHONDROPLASTY), ARTHROSCOPY KNEE  ChondroplastyWITH MEDIAL AND LATERAL  MENISECTOMY (Left) SYNOVECTOMY (Left)  Patient discharged with no apparent anesthetic complications

## 2013-10-10 ENCOUNTER — Encounter: Payer: Self-pay | Admitting: Gastroenterology

## 2013-11-04 ENCOUNTER — Ambulatory Visit: Payer: BC Managed Care – PPO | Admitting: Gastroenterology

## 2013-11-11 ENCOUNTER — Encounter: Payer: Self-pay | Admitting: Gastroenterology

## 2013-11-11 ENCOUNTER — Ambulatory Visit (INDEPENDENT_AMBULATORY_CARE_PROVIDER_SITE_OTHER): Payer: BC Managed Care – PPO | Admitting: Gastroenterology

## 2013-11-11 VITALS — BP 110/70 | HR 88 | Ht 63.0 in | Wt 174.4 lb

## 2013-11-11 DIAGNOSIS — K589 Irritable bowel syndrome without diarrhea: Secondary | ICD-10-CM

## 2013-11-11 DIAGNOSIS — Z800 Family history of malignant neoplasm of digestive organs: Secondary | ICD-10-CM

## 2013-11-11 MED ORDER — SOD PICOSULFATE-MAG OX-CIT ACD 10-3.5-12 MG-GM-GM PO PACK: 1.0000 | PACK | ORAL | Status: DC

## 2013-11-11 NOTE — Progress Notes (Addendum)
    History of Present Illness: This is a 38 year old female with Lynch syndrome. She has no gastrointestinal complaints except for chronic variation between normal and mildly constipated stools. She receives pelvic exams an every six-month pelvic ultrasounds by her gynecologist. She is considering bilateral oophorectomies. She already has had a hysterectomy. She underwent colonoscopy last year with findings of 2 sessile serrated polyps. She relates a family history of a GGF with gastric cancer and a great uncle with pancreatic cancer.   Current Medications, Allergies, Past Medical History, Past Surgical History, Family History and Social History were reviewed in [employer] record.  Physical Exam: General: Well developed , well nourished, no acute distress Head: Normocephalic and atraumatic Eyes:  sclerae anicteric, EOMI Ears: Normal auditory acuity Mouth: No deformity or lesions Lungs: Clear throughout to auscultation Heart: Regular rate and rhythm; no murmurs, rubs or bruits Abdomen: Soft, non tender and non distended. No masses, hepatosplenomegaly or hernias noted. Normal Bowel sounds Rectal: Deferred to colonoscopy Musculoskeletal: Symmetrical with no gross deformities  Pulses:  Normal pulses noted Extremities: No clubbing, cyanosis, edema or deformities noted Neurological: Alert oriented x 4, grossly nonfocal Psychological:  Alert and cooperative. Normal mood and affect  Assessment and Recommendations:  1. Lynch syndrome. Personal history of serrated sessile polyps. Given her personal history of precancerous polyps, her age and the ages of onset of colon cancer in her family, she will be maintained on an every year colonoscopy schedule for now. Schedule colonoscopy. The risks, benefits, and alternatives to colonoscopy with possible biopsy and possible polypectomy were discussed with the patient and they consent to proceed.  *Per UpToDate, routine screening for  pancreatic cancer is not recommended in all patients with Lynch syndrome. Consensus guidelines suggest screening for pancreatic cancer in individuals who have a MMR mutation and a first degree relative with pancreatic cancer. Her relative with pancreatic cancer was not a first degree relative.  *Per UpToDate, screening for gastric cancer with EGD with gastric biopsy and treatment for Helicobacter pylori infection when found on biopsy, starting at 31 to 39 years of age is recommended. Furthermore surveillance every two to three years in individuals with risk factors for gastric cancer (eg, presence of gastric atrophy, extensive or incomplete intestinal metaplasia, family history of gastric cancer, first generation immigrants from regions of high gastric cancer incidence [Asian descent]). Schedule EGD. The risks, benefits, and alternatives to endoscopy with possible biopsy and possible dilation were discussed with the patient and they consent to proceed.   2. Suspected irritable bowel syndrome. Trial of increasing dietary fiber and water intake. Trial of a daily probiotic.

## 2013-11-11 NOTE — Patient Instructions (Signed)
You have been scheduled for an endoscopy and colonoscopy with propofol. Please follow the written instructions given to you at your visit today. Please pick up your prep at the pharmacy within the next 1-3 days. If you use inhalers (even only as needed), please bring them with you on the day of your procedure. Your physician has requested that you go to www.startemmi.com and enter the access code given to you at your visit today. This web site gives a general overview about your procedure. However, you should still follow specific instructions given to you by our office regarding your preparation for the procedure.  Thank you for choosing me and [facility].  Pricilla Riffle. Dagoberto Ligas., MD., Marval Regal

## 2013-12-11 ENCOUNTER — Encounter: Payer: Self-pay | Admitting: Gastroenterology

## 2013-12-11 ENCOUNTER — Ambulatory Visit (AMBULATORY_SURGERY_CENTER): Payer: BC Managed Care – PPO | Admitting: Gastroenterology

## 2013-12-11 VITALS — BP 117/81 | HR 71 | Temp 98.0°F | Resp 18 | Ht 63.0 in | Wt 174.0 lb

## 2013-12-11 DIAGNOSIS — Z1509 Genetic susceptibility to other malignant neoplasm: Secondary | ICD-10-CM

## 2013-12-11 DIAGNOSIS — Z1211 Encounter for screening for malignant neoplasm of colon: Secondary | ICD-10-CM

## 2013-12-11 DIAGNOSIS — Z800 Family history of malignant neoplasm of digestive organs: Secondary | ICD-10-CM

## 2013-12-11 DIAGNOSIS — R1013 Epigastric pain: Secondary | ICD-10-CM

## 2013-12-11 DIAGNOSIS — K589 Irritable bowel syndrome without diarrhea: Secondary | ICD-10-CM

## 2013-12-11 DIAGNOSIS — K2940 Chronic atrophic gastritis without bleeding: Secondary | ICD-10-CM

## 2013-12-11 DIAGNOSIS — K2960 Other gastritis without bleeding: Secondary | ICD-10-CM

## 2013-12-11 DIAGNOSIS — K259 Gastric ulcer, unspecified as acute or chronic, without hemorrhage or perforation: Secondary | ICD-10-CM

## 2013-12-11 MED ORDER — OMEPRAZOLE 40 MG PO CPDR: 40.0000 mg | DELAYED_RELEASE_CAPSULE | Freq: Every morning | ORAL | Status: DC

## 2013-12-11 MED ORDER — SODIUM CHLORIDE 0.9 % IV SOLN: 500.0000 mL | INTRAVENOUS | Status: DC

## 2013-12-11 NOTE — Op Note (Signed)
[facility]  [address]. [place], [address]   ENDOSCOPY PROCEDURE REPORT  PATIENT: [name], [name]  MR#: [id number] BIRTHDATE: [birth year] , 38  yrs. old GENDER: Female ENDOSCOPIST: Ladene Artist, MD, [facility] PROCEDURE DATE:  12/11/2013 PROCEDURE:  EGD w/ biopsy ASA CLASS:     Class II INDICATIONS:  Epigastric pain.   Lynch syndrome. MEDICATIONS: MAC sedation, administered by CRNA, There was residual sedation effect present from prior procedure, and propofol (Diprivan) 180mg  IV TOPICAL ANESTHETIC: none DESCRIPTION OF PROCEDURE: After the risks benefits and alternatives of the procedure were thoroughly explained, informed consent was obtained.  The LB [id number] endoscope was introduced through the mouth and advanced to the second portion of the duodenum. Without limitations.  The instrument was slowly withdrawn as the mucosa was fully examined.  ESOPHAGUS: The mucosa of the esophagus appeared normal. STOMACH: A single non-bleeding shallow and clean-based ulcer, ranging between 3-5 mm in size, was found on the greater curvature of the gastric antrum. Biopsies were taken around the ulcer. Moderate erosive gastritis (inflammation) was found on the greater curvature of the gastric antrum.  Multiple biopsies were performed. The stomach otherwise appeared normal. DUODENUM: The duodenal mucosa showed no abnormalities in the bulb and second portion of the duodenum.  Retroflexed views revealed no abnormalities.  The scope was then withdrawn from the patient and the procedure completed.  COMPLICATIONS: There were no complications.  ENDOSCOPIC IMPRESSION: 1.   Single non-bleeding ulcer, ranging between 3-5 mm on the greater curvature of the gastric antrum 2.   Erosive gastritis on the greater curvature of the gastric antrum; multiple biopsies  RECOMMENDATIONS: 1.  Await pathology results 2.  Avoid NSAIDS 3.  PPI qam: omeprazole 40 mg po qam, 1 year  of refills 4.  Call to schedule a follow-up appointment with office 1 month  eSigned:  Ladene Artist, MD, [facility] 12/11/2013 4:01 PM

## 2013-12-11 NOTE — Progress Notes (Signed)
A/ox3 pleased with MAC, report to Annette RN 

## 2013-12-11 NOTE — Progress Notes (Signed)
Called to room to assist during endoscopic procedure.  Patient ID and intended procedure confirmed with present staff. Received instructions for my participation in the procedure from the performing physician.  

## 2013-12-11 NOTE — Op Note (Signed)
[place]  [address] [place], [id number]   COLONOSCOPY PROCEDURE REPORT  PATIENT: [name], [name]  MR#: [id number] BIRTHDATE: 1975/09/11 , 38  yrs. old GENDER: Female ENDOSCOPIST: Ladene Artist, MD, [facility] PROCEDURE DATE:  12/11/2013 PROCEDURE:   Colonoscopy, surveillance First Screening Colonoscopy - Avg.  risk and is 50 yrs.  old or older - No.  Prior Negative Screening - Now for repeat screening. N/A  History of Adenoma - Now for follow-up colonoscopy & has been > or = to 3 yrs.  No.  It has been less than 3 yrs since last colonoscopy.  Other: See Comments  Polyps Removed Today? No. Recommend repeat exam, <10 yrs? Yes.  High risk (family or personal hx). ASA CLASS:   Class II INDICATIONS:Patient's personal history of adenomatous colon polyps and Lynch syndrome. MEDICATIONS: MAC sedation, administered by CRNA and propofol (Diprivan) 420mg  IV DESCRIPTION OF PROCEDURE:   After the risks benefits and alternatives of the procedure were thoroughly explained, informed consent was obtained.  A digital rectal exam revealed no abnormalities of the rectum.   The LB [id number]  endoscope was introduced through the anus and advanced to the cecum, which was identified by both the appendix and ileocecal valve. No adverse events experienced.   The quality of the prep was Prepopik good The instrument was then slowly withdrawn as the colon was fully examined.  COLON FINDINGS: Mild diverticulosis was noted in the ascending colon and transverse colon.   The colon was otherwise normal.  There was no diverticulosis, inflammation, polyps or cancers unless previously stated.  Retroflexed views revealed small internal hemorrhoids. The time to cecum=4 minutes 47 seconds.  Withdrawal time=11 minutes 42 seconds.  The scope was withdrawn and the procedure completed.  COMPLICATIONS: There were no complications.  ENDOSCOPIC IMPRESSION: 1.   Mild diverticulosis in  the ascending colon and transverse colon  2.   Small internal hemorrhoids  RECOMMENDATIONS: 1.   Repeat Colonoscopy in 1 year.   eSigned:  Ladene Artist, MD, [facility] 12/11/2013 3:42 PM

## 2013-12-11 NOTE — Progress Notes (Signed)
No complaints noted in the recovery room. Maw   

## 2013-12-11 NOTE — Patient Instructions (Signed)
YOU HAD AN ENDOSCOPIC PROCEDURE TODAY AT THE [facility]: Refer to the procedure report that was given to you for any specific questions about what was found during the examination.  If the procedure report does not answer your questions, please call your gastroenterologist to clarify.  If you requested that your care partner not be given the details of your procedure findings, then the procedure report has been included in a sealed envelope for you to review at your convenience later.  YOU SHOULD EXPECT: Some feelings of bloating in the abdomen. Passage of more gas than usual.  Walking can help get rid of the air that was put into your GI tract during the procedure and reduce the bloating. If you had a lower endoscopy (such as a colonoscopy or flexible sigmoidoscopy) you may notice spotting of blood in your stool or on the toilet paper. If you underwent a bowel prep for your procedure, then you may not have a normal bowel movement for a few days.  DIET: Your first meal following the procedure should be a light meal and then it is ok to progress to your normal diet.  A half-sandwich or bowl of soup is an example of a good first meal.  Heavy or fried foods are harder to digest and may make you feel nauseous or bloated.  Likewise meals heavy in dairy and vegetables can cause extra gas to form and this can also increase the bloating.  Drink plenty of fluids but you should avoid alcoholic beverages for 24 hours.  ACTIVITY: Your care partner should take you home directly after the procedure.  You should plan to take it easy, moving slowly for the rest of the day.  You can resume normal activity the day after the procedure however you should NOT DRIVE or use heavy machinery for 24 hours (because of the sedation medicines used during the test).    SYMPTOMS TO REPORT IMMEDIATELY: A gastroenterologist can be reached at any hour.  During normal business hours, 8:30 AM to 5:00 PM Monday through Friday,  call [phone].  After hours and on weekends, please call the GI answering service at [phone] who will take a message and have the physician on call contact you.   Following lower endoscopy (colonoscopy or flexible sigmoidoscopy):  Excessive amounts of blood in the stool  Significant tenderness or worsening of abdominal pains  Swelling of the abdomen that is new, acute  Fever of 100F or higher  Following upper endoscopy (EGD)  Vomiting of blood or coffee ground material  New chest pain or pain under the shoulder blades  Painful or persistently difficult swallowing  New shortness of breath  Fever of 100F or higher  Black, tarry-looking stools  FOLLOW UP: If any biopsies were taken you will be contacted by phone or by letter within the next 1-3 weeks.  Call your gastroenterologist if you have not heard about the biopsies in 3 weeks.  Our staff will call the home number listed on your records the next business day following your procedure to check on you and address any questions or concerns that you may have at that time regarding the information given to you following your procedure. This is a courtesy call and so if there is no answer at the home number and we have not heard from you through the emergency physician on call, we will assume that you have returned to your regular daily activities without incident.  SIGNATURES/CONFIDENTIALITY: You and/or your care   partner have signed paperwork which will be entered into your electronic medical record.  These signatures attest to the fact that that the information above on your After Visit Summary has been reviewed and is understood.  Full responsibility of the confidentiality of this discharge information lies with you and/or your care-partner.     Handouts were given to your care partner on  diverticulosis, a high fiber diet with liberal fluid intake, hemorrhoids, and gastritis. You may resume your current medications today.  Except avoid NSAIDS.  Rx was sent to [facility] for OMEPRAZOLE 40 mg daily each am on an empty stomach. Await biopsy results. Please call to set up a follow up appointment with Dr. Fuller Plan for 1 month. Please call if any questions or concerns.

## 2013-12-12 ENCOUNTER — Telehealth: Payer: Self-pay | Admitting: *Deleted

## 2013-12-12 NOTE — Telephone Encounter (Signed)
  Follow up Call-  Call back number 12/11/2013 12/06/2012  Post procedure Call Back phone  # [phone] cell  Permission to leave phone message Yes Yes     Patient questions:  Do you have a fever, pain , or abdominal swelling? no Pain Score  0 *  Have you tolerated food without any problems? yes  Have you been able to return to your normal activities? yes  Do you have any questions about your discharge instructions: Diet   no Medications  no Follow up visit  no  Do you have questions or concerns about your Care? no  Actions: * If pain score is 4 or above: No action needed, pain <4.

## 2013-12-17 ENCOUNTER — Encounter: Payer: Self-pay | Admitting: Gastroenterology

## 2013-12-18 LAB — HM COLONOSCOPY: HM Colonoscopy: NORMAL

## 2014-02-05 ENCOUNTER — Telehealth: Payer: Self-pay | Admitting: Cardiology

## 2014-02-05 NOTE — Telephone Encounter (Signed)
New Message  Pt called states that she has had pain in her back for about a week now. She requests a call back from the nurse to discuss if it may or not be heart related. Please call

## 2014-02-05 NOTE — Telephone Encounter (Signed)
Pt. Called and instructed to either go to the ER to be checked out or get an appt with her PCP and if he thinks we need to see her then make an appt here, this was a message left on the pts machine

## 2014-02-08 NOTE — Telephone Encounter (Signed)
I talked to pt. On 02/08/14 and she stated she was feeling better and didn't feel the need to got to the hospital

## 2014-02-12 ENCOUNTER — Ambulatory Visit (INDEPENDENT_AMBULATORY_CARE_PROVIDER_SITE_OTHER): Payer: BC Managed Care – PPO | Admitting: Family Medicine

## 2014-02-12 ENCOUNTER — Encounter: Payer: Self-pay | Admitting: Family Medicine

## 2014-02-12 VITALS — BP 118/84 | HR 120 | Temp 98.8°F | Ht 63.0 in | Wt 171.0 lb

## 2014-02-12 DIAGNOSIS — R000 Tachycardia, unspecified: Secondary | ICD-10-CM

## 2014-02-12 DIAGNOSIS — R259 Unspecified abnormal involuntary movements: Secondary | ICD-10-CM

## 2014-02-12 DIAGNOSIS — M545 Low back pain, unspecified: Secondary | ICD-10-CM

## 2014-02-12 DIAGNOSIS — R251 Tremor, unspecified: Secondary | ICD-10-CM

## 2014-02-12 LAB — COMPREHENSIVE METABOLIC PANEL
ALK PHOS: 58 U/L (ref 39–117)
ALT: 14 U/L (ref 0–35)
AST: 16 U/L (ref 0–37)
Albumin: 4.4 g/dL (ref 3.5–5.2)
BUN: 13 mg/dL (ref 6–23)
CALCIUM: 9.5 mg/dL (ref 8.4–10.5)
CO2: 24 mEq/L (ref 19–32)
CREATININE: 1.1 mg/dL (ref 0.4–1.2)
Chloride: 106 mEq/L (ref 96–112)
GFR: 71.65 mL/min (ref >60.00)
Glucose, Bld: 76 mg/dL (ref 70–99)
POTASSIUM: 3.8 meq/L (ref 3.5–5.1)
Sodium: 138 mEq/L (ref 135–145)
Total Bilirubin: 0.7 mg/dL (ref 0.2–1.2)
Total Protein: 7.5 g/dL (ref 6.0–8.3)

## 2014-02-12 LAB — POCT URINALYSIS DIPSTICK
Blood, UA: NEGATIVE
Glucose, UA: NEGATIVE
Leukocytes, UA: NEGATIVE
Nitrite, UA: NEGATIVE
Spec Grav, UA: 1.02
UROBILINOGEN UA: 0.2
pH, UA: 6

## 2014-02-12 LAB — CBC WITH DIFFERENTIAL/PLATELET
Basophils Absolute: 0 10*3/uL (ref 0.0–0.1)
Basophils Relative: 0.4 % (ref 0.0–3.0)
EOS ABS: 0.1 10*3/uL (ref 0.0–0.7)
Eosinophils Relative: 0.7 % (ref 0.0–5.0)
HEMATOCRIT: 42.6 % (ref 36.0–46.0)
HEMOGLOBIN: 14 g/dL (ref 12.0–15.0)
LYMPHS ABS: 1.5 10*3/uL (ref 0.7–4.0)
Lymphocytes Relative: 18.8 % (ref 12.0–46.0)
MCHC: 32.8 g/dL (ref 30.0–36.0)
MCV: 90.3 fl (ref 78.0–100.0)
MONO ABS: 0.4 10*3/uL (ref 0.1–1.0)
Monocytes Relative: 4.6 % (ref 3.0–12.0)
NEUTROS ABS: 6.2 10*3/uL (ref 1.4–7.7)
Neutrophils Relative %: 75.5 % (ref 43.0–77.0)
Platelets: 266 10*3/uL (ref 150.0–400.0)
RBC: 4.71 Mil/uL (ref 3.87–5.11)
RDW: 13.6 % (ref 11.5–15.5)
WBC: 8.2 10*3/uL (ref 4.0–10.5)

## 2014-02-12 LAB — TSH: TSH: 0.6 u[IU]/mL (ref 0.35–4.50)

## 2014-02-12 NOTE — Patient Instructions (Signed)
Not sure of cause of shakiness Low back pain may be caused by MSK strain, agree with prednisone as prescribed by orthopedics if labs are ok We will call you with results from labs so hold off on prednisone until you get a call.

## 2014-02-12 NOTE — Progress Notes (Signed)
Garret Reddish, MD Phone: [phone]  Subjective:   [name] is a 38 y.o. year old very pleasant female patient who presents with the following:  Back pain and shakiness X 1 week. Saw orthopedist last week who sent in Rx for steroids. Had normal low back x-ray. Thought to have some muscle spasm. Did not take the steroids which were planned for 6 days. Worried about kidney infection. Symptoms seem to be worsening both in back and in shakiness. Pain 4/10 aching.Worse with sitting down. Does feel urgency to go to restroom and then only has small amount. Also with some low abdominal/suprapubic tenderness. Patient admits to some anxiety surrounding physical symptoms in the past and that this may be playing a role in the shakiness. ROS- No polyuria (normally 3x a day now 1-2). No fevers/chills. No nausea/vomiting. No diarrhea. Mildly constipated-straining and going once every 3-4 days (this is not changed). Mild eye tenderness on and off for 3 days. No recent weight changes. Mild fatigue. No dizziness. No chest pain or dyspnea. Does have some mild palpitations which are unchanged for her.   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 Doxylamine Succinate, Sleep, (UNISOM PO) Take 1 tablet by mouth at bedtime as needed. For sleep       No current facility-administered medications for this visit.    Objective: BP 118/84  Pulse 120  Temp(Src) 98.8 F (37.1 C)  Ht 5\' 3"  (1.6 m)  Wt 171 lb (77.565 kg)  BMI 30.30 kg/m2  LMP 01/12/2012 Gen: NAD, resting comfortably on table Eyes; sclera and conjunctiva normal, no obvious retinal abnormalities CV: RRR 3/6 holosystolic murmur heard best at LLSB. No rubs or gallops Lungs: CTAB no crackles,  wheeze, rhonchi Abdomen: soft/nondistended/normal bowel sounds. No rebound or guarding. Some suprapubic tenderness.  Ext: no edema  Skin: warm, dry, no rash Neuro: grossly normal, moves all extremities  Results for orders placed in visit on 02/12/14 (from the past 24 hour(s))  CBC WITH DIFFERENTIAL     Status: None   Collection Time    02/12/14  2:00 PM      Result Value Ref Range   WBC 8.2  4.0 - 10.5 K/uL   RBC 4.71  3.87 - 5.11 Mil/uL   Hemoglobin 14.0  12.0 - 15.0 g/dL   HCT 42.6  36.0 - 46.0 %   MCV 90.3  78.0 - 100.0 fl   MCHC 32.8  30.0 - 36.0 g/dL   RDW 13.6  11.5 - 15.5 %   Platelets 266.0  150.0 - 400.0 K/uL   Neutrophils Relative % 75.5  43.0 - 77.0 %   Lymphocytes Relative 18.8  12.0 - 46.0 %   Monocytes Relative 4.6  3.0 - 12.0 %   Eosinophils Relative 0.7  0.0 - 5.0 %   Basophils Relative 0.4  0.0 - 3.0 %   Neutro Abs 6.2  1.4 - 7.7 K/uL   Lymphs Abs 1.5  0.7 - 4.0 K/uL   Monocytes Absolute 0.4  0.1 - 1.0 K/uL   Eosinophils Absolute 0.1  0.0 - 0.7 K/uL   Basophils Absolute 0.0  0.0 - 0.1 K/uL  COMPREHENSIVE METABOLIC PANEL     Status: None   Collection Time    02/12/14  2:00 PM      Result Value  Ref Range   Sodium 138  135 - 145 mEq/L   Potassium 3.8  3.5 - 5.1 mEq/L   Chloride 106  96 - 112 mEq/L   CO2 24  19 - 32 mEq/L   Glucose, Bld 76  70 - 99 mg/dL   BUN 13  6 - 23 mg/dL   Creatinine, Ser 1.1  0.4 - 1.2 mg/dL   Total Bilirubin 0.7  0.2 - 1.2 mg/dL   Alkaline Phosphatase 58  39 - 117 U/L   AST 16  0 - 37 U/L   ALT 14  0 - 35 U/L   Total Protein 7.5  6.0 - 8.3 g/dL   Albumin 4.4  3.5 - 5.2 g/dL   Calcium 9.5  8.4 - 10.5 mg/dL   GFR 71.65  >60.00 mL/min  TSH     Status: None   Collection Time    02/12/14  2:00 PM      Result Value Ref Range   TSH 0.60  0.35 - 4.50 uIU/mL  POCT URINALYSIS DIPSTICK     Status: None   Collection Time    02/12/14  2:11 PM      Result Value Ref Range   Color, UA orange     Clarity, UA cloudy     Glucose, UA n      Bilirubin, UA 1+     Ketones, UA 2+     Spec Grav, UA 1.020     Blood, UA n     pH, UA 6.0     Protein, UA 1+     Urobilinogen, UA 0.2     Nitrite, UA n     Leukocytes, UA Negative      Assessment/Plan:  Back pain and shakiness Completed CBC with concern for potential infection but no white count elevation noted. Completed CMET to evaluate kidneys and LFTs with no abnormality noted Check TSH due to pulse and unremarkable Check urine for concern of UTI with suprapubic tenderness but infection not identified. Given ketones possible mild dehydration. Have advised patient to stay well-hydrated. I do agree with likely muscle spasm or muscle strain and low back as diagnosed by orthopedist. Have advised patient by my chart message to take prednisone previously prescribed. She is to followup if her symptoms do not improve or if they worsen. Discussed with patient that I think her shakiness is likely due to some anxiety.  Orders Placed This Encounter  Procedures  . CBC with Differential  . Comprehensive metabolic panel    [place]  . TSH    [place]  . POCT urinalysis dipstick    In house

## 2014-02-18 ENCOUNTER — Ambulatory Visit: Payer: BC Managed Care – PPO | Admitting: Cardiology

## 2014-03-19 ENCOUNTER — Ambulatory Visit (INDEPENDENT_AMBULATORY_CARE_PROVIDER_SITE_OTHER)
Admission: RE | Admit: 2014-03-19 | Discharge: 2014-03-19 | Disposition: A | Discharge status: Home / Self Care | Payer: BC Managed Care – PPO | Source: Ambulatory Visit | Attending: Internal Medicine | Admitting: Internal Medicine

## 2014-03-19 ENCOUNTER — Encounter: Payer: Self-pay | Admitting: Internal Medicine

## 2014-03-19 ENCOUNTER — Ambulatory Visit (INDEPENDENT_AMBULATORY_CARE_PROVIDER_SITE_OTHER): Payer: BC Managed Care – PPO | Admitting: Internal Medicine

## 2014-03-19 VITALS — BP 112/80 | HR 95 | Temp 98.3°F | Resp 16 | Ht 63.0 in | Wt 170.0 lb

## 2014-03-19 DIAGNOSIS — G4700 Insomnia, unspecified: Secondary | ICD-10-CM

## 2014-03-19 DIAGNOSIS — M949 Disorder of cartilage, unspecified: Secondary | ICD-10-CM

## 2014-03-19 DIAGNOSIS — F329 Major depressive disorder, single episode, unspecified: Secondary | ICD-10-CM

## 2014-03-19 DIAGNOSIS — J309 Allergic rhinitis, unspecified: Secondary | ICD-10-CM

## 2014-03-19 DIAGNOSIS — F3289 Other specified depressive episodes: Secondary | ICD-10-CM

## 2014-03-19 DIAGNOSIS — M898X1 Other specified disorders of bone, shoulder: Secondary | ICD-10-CM | POA: Insufficient documentation

## 2014-03-19 DIAGNOSIS — K219 Gastro-esophageal reflux disease without esophagitis: Secondary | ICD-10-CM

## 2014-03-19 DIAGNOSIS — M899 Disorder of bone, unspecified: Secondary | ICD-10-CM

## 2014-03-19 DIAGNOSIS — M25511 Pain in right shoulder: Secondary | ICD-10-CM

## 2014-03-19 DIAGNOSIS — G43009 Migraine without aura, not intractable, without status migrainosus: Secondary | ICD-10-CM

## 2014-03-19 MED ORDER — SUVOREXANT 15 MG PO TABS: 1.0000 | ORAL_TABLET | Freq: Every evening | ORAL | Status: DC | PRN

## 2014-03-19 NOTE — Patient Instructions (Signed)
Insomnia Insomnia is frequent trouble falling and/or staying asleep. Insomnia can be a long term problem or a short term problem. Both are common. Insomnia can be a short term problem when the wakefulness is related to a certain stress or worry. Long term insomnia is often related to ongoing stress during waking hours and/or poor sleeping habits. Overtime, sleep deprivation itself can make the problem worse. Every little thing feels more severe because you are overtired and your ability to cope is decreased. CAUSES   Stress, anxiety, and depression.  Poor sleeping habits.  Distractions such as TV in the bedroom.  Naps close to bedtime.  Engaging in emotionally charged conversations before bed.  Technical reading before sleep.  Alcohol and other sedatives. They may make the problem worse. They can hurt normal sleep patterns and normal dream activity.  Stimulants such as caffeine for several hours prior to bedtime.  Pain syndromes and shortness of breath can cause insomnia.  Exercise late at night.  Changing time zones may cause sleeping problems (jet lag). It is sometimes helpful to have someone observe your sleeping patterns. They should look for periods of not breathing during the night (sleep apnea). They should also look to see how long those periods last. If you live alone or observers are uncertain, you can also be observed at a sleep clinic where your sleep patterns will be professionally monitored. Sleep apnea requires a checkup and treatment. Give your caregivers your medical history. Give your caregivers observations your family has made about your sleep.  SYMPTOMS   Not feeling rested in the morning.  Anxiety and restlessness at bedtime.  Difficulty falling and staying asleep. TREATMENT   Your caregiver may prescribe treatment for an underlying medical disorders. Your caregiver can give advice or help if you are using alcohol or other drugs for self-medication. Treatment  of underlying problems will usually eliminate insomnia problems.  Medications can be prescribed for short time use. They are generally not recommended for lengthy use.  Over-the-counter sleep medicines are not recommended for lengthy use. They can be habit forming.  You can promote easier sleeping by making lifestyle changes such as:  Using relaxation techniques that help with breathing and reduce muscle tension.  Exercising earlier in the day.  Changing your diet and the time of your last meal. No night time snacks.  Establish a regular time to go to bed.  Counseling can help with stressful problems and worry.  Soothing music and white noise may be helpful if there are background noises you cannot remove.  Stop tedious detailed work at least one hour before bedtime. HOME CARE INSTRUCTIONS   Keep a diary. Inform your caregiver about your progress. This includes any medication side effects. See your caregiver regularly. Take note of:  Times when you are asleep.  Times when you are awake during the night.  The quality of your sleep.  How you feel the next day. This information will help your caregiver care for you.  Get out of bed if you are still awake after 15 minutes. Read or do some quiet activity. Keep the lights down. Wait until you feel sleepy and go back to bed.  Keep regular sleeping and waking hours. Avoid naps.  Exercise regularly.  Avoid distractions at bedtime. Distractions include watching television or engaging in any intense or detailed activity like attempting to balance the household checkbook.  Develop a bedtime ritual. Keep a familiar routine of bathing, brushing your teeth, climbing into bed at the same   time each night, listening to soothing music. Routines increase the success of falling to sleep faster.  Use relaxation techniques. This can be using breathing and muscle tension release routines. It can also include visualizing peaceful scenes. You can  also help control troubling or intruding thoughts by keeping your mind occupied with boring or repetitive thoughts like the old concept of counting sheep. You can make it more creative like imagining planting one beautiful flower after another in your backyard garden.  During your day, work to eliminate stress. When this is not possible use some of the previous suggestions to help reduce the anxiety that accompanies stressful situations. MAKE SURE YOU:   Understand these instructions.  Will watch your condition.  Will get help right away if you are not doing well or get worse. Document Released: 06/17/2000 Document Revised: 09/12/2011 Document Reviewed: 07/18/2007 Exit[employer] Patient Information 2015 [employer]. This information is not intended to replace advice given to you by your health care provider. Make sure you discuss any questions you have with your health care provider.  

## 2014-03-19 NOTE — Progress Notes (Signed)
Pre visit review using our clinic review tool, if applicable. No additional management support is needed unless otherwise documented below in the visit note. 

## 2014-03-20 ENCOUNTER — Encounter: Payer: Self-pay | Admitting: Internal Medicine

## 2014-03-21 ENCOUNTER — Encounter: Payer: Self-pay | Admitting: Internal Medicine

## 2014-03-21 NOTE — Assessment & Plan Note (Signed)
She will try belsomra for this 

## 2014-03-21 NOTE — Progress Notes (Signed)
   Subjective:    Patient ID: [name], female    DOB: Jun 04, 1976, 38 y.o.   MRN: [id number]  HPI Comments: She was recently putting on a necklace recently and felt like the medial edge of her right collar bone was more prominent that the left side. There is no pain or swelling and no recent injury.     Review of Systems  Constitutional: Negative.  Negative for fever, chills, diaphoresis, appetite change and fatigue.  HENT: Negative.   Eyes: Negative.   Respiratory: Negative.  Negative for cough, choking, chest tightness, shortness of breath and stridor.   Cardiovascular: Negative for chest pain, palpitations and leg swelling.  Gastrointestinal: Negative.  Negative for nausea, vomiting, abdominal pain, diarrhea, constipation and blood in stool.  Endocrine: Negative.   Genitourinary: Negative.   Musculoskeletal: Negative.  Negative for arthralgias, back pain, myalgias, neck pain and neck stiffness.  Skin: Negative.  Negative for rash.  Allergic/Immunologic: Negative.   Neurological: Negative.  Negative for dizziness, tremors, weakness, light-headedness and headaches.  Hematological: Negative.  Negative for adenopathy. Does not bruise/bleed easily.  Psychiatric/Behavioral: Positive for sleep disturbance. Negative for suicidal ideas, hallucinations, behavioral problems, confusion, self-injury, dysphoric mood, decreased concentration and agitation. The patient is not nervous/anxious and is not hyperactive.        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        Objective:   Physical Exam  Vitals reviewed. Constitutional: She is oriented to person, place, and time. She appears well-developed and well-nourished. No distress.  HENT:  Head: Normocephalic and atraumatic.  Mouth/Throat: Oropharynx is clear and moist. No oropharyngeal exudate.  Eyes: Conjunctivae are normal. Right eye exhibits no discharge. Left eye exhibits no discharge.  No scleral icterus.  Neck: Trachea normal and normal range of motion. Neck supple. No JVD present. No spinous process tenderness and no muscular tenderness present. Carotid bruit is not present. No tracheal deviation, no edema, no erythema and normal range of motion present. No mass and no thyromegaly present.  Cardiovascular: Normal rate, regular rhythm, normal heart sounds and intact distal pulses.  Exam reveals no gallop and no friction rub.   No murmur heard. Pulmonary/Chest: Effort normal and breath sounds normal. No stridor. No respiratory distress. She has no wheezes. She has no rales. She exhibits no tenderness.  Abdominal: Soft. Bowel sounds are normal. She exhibits no distension and no mass. There is no tenderness. There is no rebound and no guarding.  Musculoskeletal: Normal range of motion. She exhibits no edema and no tenderness.  Lymphadenopathy:       Head (right side): No submental, no submandibular, no tonsillar, no preauricular, no posterior auricular and no occipital adenopathy present.       Head (left side): No submental, no submandibular, no tonsillar, no preauricular, no posterior auricular and no occipital adenopathy present.    She has no cervical adenopathy.       Right: No supraclavicular adenopathy present.       Left: No supraclavicular adenopathy present.  Neurological: She is oriented to person, place, and time.  Skin: Skin is warm and dry. No rash noted. She is not diaphoretic. No erythema. No pallor.  Psychiatric: She has a normal mood and affect. Her behavior is normal. Judgment and thought content normal.          Assessment & Plan:

## 2014-03-21 NOTE — Assessment & Plan Note (Addendum)
The exam and xray are normal, will observe for now

## 2014-04-09 ENCOUNTER — Other Ambulatory Visit: Payer: Self-pay | Admitting: Obstetrics and Gynecology

## 2014-04-10 LAB — CYTOLOGY - PAP

## 2014-04-28 ENCOUNTER — Ambulatory Visit (INDEPENDENT_AMBULATORY_CARE_PROVIDER_SITE_OTHER): Payer: BC Managed Care – PPO | Admitting: Gastroenterology

## 2014-04-28 ENCOUNTER — Encounter: Payer: Self-pay | Admitting: Gastroenterology

## 2014-04-28 VITALS — BP 120/80 | HR 59 | Ht 64.0 in | Wt 175.8 lb

## 2014-04-28 DIAGNOSIS — M545 Low back pain, unspecified: Secondary | ICD-10-CM

## 2014-04-28 DIAGNOSIS — Z1509 Genetic susceptibility to other malignant neoplasm: Secondary | ICD-10-CM

## 2014-04-28 DIAGNOSIS — R10A2 Flank pain, left side: Secondary | ICD-10-CM

## 2014-04-28 DIAGNOSIS — R109 Unspecified abdominal pain: Secondary | ICD-10-CM

## 2014-04-28 DIAGNOSIS — Z1507 Genetic susceptibility to malignant neoplasm of urinary tract: Secondary | ICD-10-CM

## 2014-04-28 MED ORDER — OMEPRAZOLE 40 MG PO CPDR: 40.0000 mg | DELAYED_RELEASE_CAPSULE | Freq: Every day | ORAL | Status: DC

## 2014-04-28 NOTE — Progress Notes (Signed)
    History of Present Illness: This is a 38 year old female with a history of Lynch syndrome who underwent upper endoscopy and colonoscopy in June 2015. Small gastric ulcer and erosive duodenitis was noted as well as mild diverticulosis. She relates lumbosacral area back pain with occasional radiation around her left lower flank for the past several weeks. Symptoms are improved with a heating pad. They are brought on by certain movements. They do not relate to any digestive function. She has been followed at [place] and states she had an MRI of her lower back performed. I do not have these records available. She's been evaluated by her PCP and by her gynecologist Dr. Gertie Fey who performed a pelvic exam and pelvic ultrasound. He apparently noted some abnormality in the left pelvis on pelvic exam however I do not have records available today. Denies weight loss, constipation, diarrhea, change in stool caliber, melena, hematochezia, nausea, vomiting, dysphagia, reflux symptoms, chest pain.  Current Medications, Allergies, Past Medical History, Past Surgical History, Family History and Social History were reviewed in [employer] record.  Physical Exam: General: Well developed , well nourished, no acute distress Head: Normocephalic and atraumatic Eyes:  sclerae anicteric, EOMI Ears: Normal auditory acuity Mouth: No deformity or lesions Lungs: Clear throughout to auscultation Heart: Regular rate and rhythm; no murmurs, rubs or bruits Abdomen: Soft, non tender and non distended. No masses, hepatosplenomegaly or hernias noted. Normal Bowel sounds Musculoskeletal: Symmetrical with no gross deformities  Pulses:  Normal pulses noted Extremities: No clubbing, cyanosis, edema or deformities noted Neurological: Alert oriented x 4, grossly nonfocal Psychological:  Alert and cooperative. Normal mood and affect  Assessment and Recommendations:  1. Low back pain with  intermittent left flank pain. Abnormal pelvic exam by Dr. Gaetano Net. Abd/pelvic CT to further evaluate. Musculoskeletal pain is highly likely. Treat with Tylenol as directed. Follow up with PCP and orthpedics.   2. Lynch syndrome. Screening as previously recommended.  3. History of GU and erosive gastritis. Resume omeprazole 40 mg daily for long term use.

## 2014-04-28 NOTE — Patient Instructions (Addendum)
You will be due for a recall colonoscopy in 12/2014. We will send you a reminder in the mail when it gets closer to that time. We can send another prescription to your pharmacy for Omeprazole.   You have been scheduled for a CT scan of the abdomen and pelvis at [facility] ([address]---this is in the same building as Press [facility]).   You are scheduled on 04-30-2014 at 1:00 pm. You should arrive 15 minutes prior to your appointment time for registration. Please follow the written instructions below on the day of your exam:  WARNING: IF YOU ARE ALLERGIC TO IODINE/X-RAY DYE, PLEASE NOTIFY RADIOLOGY IMMEDIATELY AT [phone]! YOU WILL BE GIVEN A 13 HOUR PREMEDICATION PREP.  1) Do not eat or drink anything after 9:00 am (4 hours prior to your test) 2) You have been given 2 bottles of oral contrast to drink. The solution may taste better if refrigerated, but do NOT add ice or any other liquid to this solution. Shake well before drinking.    Drink 1 bottle of contrast @ 11:00 am (2 hours prior to your exam)  Drink 1 bottle of contrast @ 12:00 pm (1 hour prior to your exam)  You may take any medications as prescribed with a small amount of water except for the following: Metformin, Glucophage, Glucovance, Avandamet, Riomet, Fortamet, Actoplus Met, Janumet, Glumetza or Metaglip. The above medications must be held the day of the exam AND 48 hours after the exam.  The purpose of you drinking the oral contrast is to aid in the visualization of your intestinal tract. The contrast solution may cause some diarrhea. Before your exam is started, you will be given a small amount of fluid to drink. Depending on your individual set of symptoms, you may also receive an intravenous injection of x-ray contrast/dye. Plan on being at [facility] for 30 minutes or long, depending on the type of exam you are having performed.  This test typically takes 30-45 minutes to complete.  If you  have any questions regarding your exam or if you need to reschedule, you may call the CT department at [phone] between the hours of 8:00 am and 5:00 pm, Monday-Friday.  ________________________________________________________________________  cc: Everlene Farrier, MD

## 2014-04-30 ENCOUNTER — Ambulatory Visit (INDEPENDENT_AMBULATORY_CARE_PROVIDER_SITE_OTHER)
Admission: RE | Admit: 2014-04-30 | Discharge: 2014-04-30 | Disposition: A | Discharge status: Home / Self Care | Payer: BC Managed Care – PPO | Source: Ambulatory Visit | Attending: Gastroenterology | Admitting: Gastroenterology

## 2014-04-30 DIAGNOSIS — Z1509 Genetic susceptibility to other malignant neoplasm: Secondary | ICD-10-CM

## 2014-04-30 DIAGNOSIS — R109 Unspecified abdominal pain: Secondary | ICD-10-CM

## 2014-04-30 DIAGNOSIS — M545 Low back pain, unspecified: Secondary | ICD-10-CM

## 2014-04-30 MED ORDER — IOHEXOL 300 MG/ML  SOLN
100.0000 mL | Freq: Once | INTRAMUSCULAR | Status: AC | PRN
  Administered 2014-04-30: 100 mL via INTRAVENOUS

## 2014-05-05 ENCOUNTER — Encounter: Payer: Self-pay | Admitting: Gastroenterology

## 2014-07-23 ENCOUNTER — Ambulatory Visit: Payer: BC Managed Care – PPO | Admitting: Gastroenterology

## 2014-07-28 ENCOUNTER — Ambulatory Visit: Payer: Self-pay | Admitting: Cardiology

## 2014-08-01 ENCOUNTER — Ambulatory Visit (INDEPENDENT_AMBULATORY_CARE_PROVIDER_SITE_OTHER): Payer: BLUE CROSS/BLUE SHIELD | Admitting: Cardiology

## 2014-08-01 VITALS — BP 120/100 | HR 100 | Ht 63.0 in | Wt 173.0 lb

## 2014-08-01 DIAGNOSIS — I059 Rheumatic mitral valve disease, unspecified: Secondary | ICD-10-CM

## 2014-08-01 NOTE — Patient Instructions (Signed)
Your physician has requested that you have an echocardiogram. Echocardiography is a painless test that uses sound waves to create images of your heart. It provides your doctor with information about the size and shape of your heart and how well your heart's chambers and valves are working. This procedure takes approximately one hour. There are no restrictions for this procedure.  Your physician wants you to follow-up in: 1 year or sooner with Dr. Percival Spanish. You will receive a reminder letter in the mail two months in advance. If you don't receive a letter, please call our office to schedule the follow-up appointment.

## 2014-08-01 NOTE — Progress Notes (Signed)
08/01/2014 [name]   03-27-1976  [id number]  Primary Physician [name], MD Primary Cardiologist: Dr. Warren Lacy  Reason for Visit/ CC: routine follow-up for MVP  HPI:  [name] is a 39 y/o AAF followed for valvular disease. She has MVP with what is felt to be moderate MR. She has had chest pain and dyspnea in the past and was evaluated by Dr. Roxy Manns in 2013 for possible MV repair. Echo 3/13: mild focal basal septal hypertrophy, EF 55-60%, myxomatous MV with bileaflet prolapse and mod MR, mild LAE. She was ultimately referred for TEE 4/13: bileaflet MVP with mod myxomatous changes, mod to severe MR, EF 65%, mild LAE. It was not clear that her MR was severe enough to warrant repair. She underwent ETT-echo 5/13: mild to mild-mod MR with no change during stress. It was decided her MR was not severe enough to warrant surgery and continued monitoring was recommended. She has not been seen since 05/2013. She presents today for check-up. She wants to ensure that her valve is still stable.   Since her last office visit, she states that she has been doing fairly well. She denies CP. She notes DOE but states that this is not new for her and she feels this is due to deconditioning. She denies syncope/ near syncope but did experience 2 separate episodes of transient dizziness 2 weeks ago. Each episode lasted < 20 seconds. She denied any associated symptoms, including no palpitations and no LOC. She denies any recurrent symptoms since that time. No symptoms of HF: no orthopnea, PND or LEE.    Current Outpatient Prescriptions  Medication Sig Dispense Refill  . doxylamine, Sleep, (UNISOM) 25 MG tablet Take 25 mg by mouth at bedtime.     No current facility-administered medications for this visit.    No Known Allergies  History   Social History  . Marital Status: Married    Spouse Name: N/A    Number of Children: 2  . Years of Education: N/A   Occupational History  . patient  account rep [employer]   Social History Main Topics  . Smoking status: Former Smoker -- 0.50 packs/day for 12 years    Quit date: 11/29/2010  . Smokeless tobacco: Never Used  . Alcohol Use: Yes     Comment: social  . Drug Use: No  . Sexual Activity: Yes    Birth Control/ Protection: Surgical   Other Topics Concern  . Not on file   Social History Narrative     Review of Systems: General: negative for chills, fever, night sweats or weight changes.  Cardiovascular: negative for chest pain, dyspnea on exertion, edema, orthopnea, palpitations, paroxysmal nocturnal dyspnea or shortness of breath Dermatological: negative for rash Respiratory: negative for cough or wheezing Urologic: negative for hematuria Abdominal: negative for nausea, vomiting, diarrhea, bright red blood per rectum, melena, or hematemesis Neurologic: negative for visual changes, syncope, or dizziness All other systems reviewed and are otherwise negative except as noted above.    Blood pressure 120/100, pulse 100, height 5\' 3"  (1.6 m), weight 173 lb (78.472 kg), last menstrual period 01/12/2012.  General appearance: alert, cooperative and no distress Neck: no carotid bruit and no JVD Lungs: clear to auscultation bilaterally Heart: 7-6/7 holosystolic murmur  Extremities: no LEE Pulses: 2+ and symmetric Skin: warm and dry Neurologic: Grossly normal  EKG NSR. HR 100. No ischemic changes.   ASSESSMENT AND PLAN:   1. 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  Roxy Manns who recommended continued monitoring. Last OV and last echo was in 2014. No chest pain or syncope but has had DOE w/o orthopnea, PND or LEE. Will order a 2D echo to reassess status of MV to ensure that there has been no disease progression. If significant change on echo, will have the patient f/u with Dr. Percival Spanish. If no significant change, she has been instructed to f/u in 1 year.   2. Elevated BP: DBP noted to be elevated at 100. Patient denies  h/o HTN. We discussed importance of low sodium diet. She will monitor BP closely at home. She has been instructed to f/u with PCP if SBP is consistently > 140 or DBP consistently > 90. She verbalized understanding.    [name], BRITTAINYPA-C 08/01/2014 2:11 PM

## 2014-08-03 ENCOUNTER — Encounter: Payer: Self-pay | Admitting: Cardiology

## 2014-08-07 ENCOUNTER — Ambulatory Visit: Payer: Self-pay | Admitting: Cardiology

## 2014-08-07 ENCOUNTER — Ambulatory Visit (HOSPITAL_COMMUNITY)
Admission: RE | Admit: 2014-08-07 | Discharge: 2014-08-07 | Disposition: A | Discharge status: Home / Self Care | Payer: BLUE CROSS/BLUE SHIELD | Source: Ambulatory Visit | Attending: Internal Medicine | Admitting: Internal Medicine

## 2014-08-07 DIAGNOSIS — I340 Nonrheumatic mitral (valve) insufficiency: Secondary | ICD-10-CM

## 2014-08-07 DIAGNOSIS — I349 Nonrheumatic mitral valve disorder, unspecified: Secondary | ICD-10-CM | POA: Diagnosis present

## 2014-08-07 DIAGNOSIS — I059 Rheumatic mitral valve disease, unspecified: Secondary | ICD-10-CM

## 2014-08-07 NOTE — Progress Notes (Signed)
2D Echo Performed 08/07/2014    [name], RCS

## 2014-08-08 ENCOUNTER — Telehealth: Payer: Self-pay | Admitting: Cardiology

## 2014-08-08 NOTE — Telephone Encounter (Signed)
New message         Pt calling for echo results

## 2014-08-11 ENCOUNTER — Telehealth: Payer: Self-pay | Admitting: Cardiology

## 2014-08-11 NOTE — Telephone Encounter (Signed)
Pt called in wanting to know if her results from her echo that was done on 2/4 were available. Please call  Thanks

## 2014-08-11 NOTE — Telephone Encounter (Signed)
Returned call to patient echo results not available.Message sent to Dr.Hochrein's nurse [place].

## 2014-08-13 ENCOUNTER — Encounter: Payer: Self-pay | Admitting: Cardiology

## 2014-08-13 ENCOUNTER — Telehealth: Payer: Self-pay | Admitting: Cardiology

## 2014-08-13 ENCOUNTER — Encounter: Payer: Self-pay | Admitting: Internal Medicine

## 2014-08-13 NOTE — Telephone Encounter (Signed)
Follow up   ° ° ° °Patient calling back for test results  °

## 2014-08-13 NOTE — Telephone Encounter (Signed)
Pt would echo results from 08-07-14 please.

## 2014-08-13 NOTE — Telephone Encounter (Signed)
Please result this pt.s echo

## 2014-08-14 NOTE — Progress Notes (Signed)
Pt. Informed about her echo

## 2014-08-14 NOTE — Telephone Encounter (Signed)
I spoke with patient and gave her the echo results.  She verbalized understanding.

## 2014-09-11 LAB — BASIC METABOLIC PANEL
BUN: 8 mg/dL (ref 4–21)
Creatinine: 1 mg/dL (ref 0.5–1.1)
Potassium: 4.5 mmol/L (ref 3.4–5.3)
SODIUM: 138 mmol/L (ref 137–147)

## 2014-09-11 LAB — HEPATIC FUNCTION PANEL
ALK PHOS: 66 U/L (ref 25–125)
ALT: 18 U/L (ref 7–35)
AST: 16 U/L (ref 13–35)
BILIRUBIN, TOTAL: 0.4 mg/dL

## 2014-09-11 LAB — HEMOGLOBIN A1C: Hgb A1c MFr Bld: 5.5 % (ref 4.0–6.0)

## 2014-09-11 LAB — TSH: TSH: 3.33 u[IU]/mL (ref 0.41–5.90)

## 2014-09-22 ENCOUNTER — Encounter: Payer: Self-pay | Admitting: Internal Medicine

## 2014-09-22 ENCOUNTER — Ambulatory Visit (INDEPENDENT_AMBULATORY_CARE_PROVIDER_SITE_OTHER): Payer: BLUE CROSS/BLUE SHIELD | Admitting: Internal Medicine

## 2014-09-22 VITALS — BP 130/90 | HR 94 | Temp 98.7°F | Resp 16 | Ht 63.0 in

## 2014-09-22 DIAGNOSIS — E038 Other specified hypothyroidism: Secondary | ICD-10-CM

## 2014-09-22 DIAGNOSIS — Z1231 Encounter for screening mammogram for malignant neoplasm of breast: Secondary | ICD-10-CM | POA: Insufficient documentation

## 2014-09-22 DIAGNOSIS — N951 Menopausal and female climacteric states: Secondary | ICD-10-CM

## 2014-09-22 MED ORDER — LEVOTHYROXINE SODIUM 50 MCG PO TABS: 50.0000 ug | ORAL_TABLET | Freq: Every day | ORAL | Status: DC

## 2014-09-22 NOTE — Progress Notes (Signed)
Pre visit review using our clinic review tool, if applicable. No additional management support is needed unless otherwise documented below in the visit note. 

## 2014-09-23 ENCOUNTER — Other Ambulatory Visit (INDEPENDENT_AMBULATORY_CARE_PROVIDER_SITE_OTHER): Payer: BLUE CROSS/BLUE SHIELD

## 2014-09-23 ENCOUNTER — Encounter: Payer: Self-pay | Admitting: Internal Medicine

## 2014-09-23 DIAGNOSIS — N951 Menopausal and female climacteric states: Secondary | ICD-10-CM

## 2014-09-23 DIAGNOSIS — E038 Other specified hypothyroidism: Secondary | ICD-10-CM

## 2014-09-23 LAB — CBC WITH DIFFERENTIAL/PLATELET
Basophils Absolute: 0 10*3/uL (ref 0.0–0.1)
Basophils Relative: 0.6 % (ref 0.0–3.0)
EOS ABS: 0.1 10*3/uL (ref 0.0–0.7)
Eosinophils Relative: 1.2 % (ref 0.0–5.0)
HCT: 40.8 % (ref 36.0–46.0)
HEMOGLOBIN: 13.8 g/dL (ref 12.0–15.0)
LYMPHS ABS: 1.1 10*3/uL (ref 0.7–4.0)
Lymphocytes Relative: 19.4 % (ref 12.0–46.0)
MCHC: 33.8 g/dL (ref 30.0–36.0)
MCV: 86.2 fl (ref 78.0–100.0)
MONO ABS: 0.3 10*3/uL (ref 0.1–1.0)
Monocytes Relative: 5.6 % (ref 3.0–12.0)
NEUTROS ABS: 4.2 10*3/uL (ref 1.4–7.7)
Neutrophils Relative %: 73.2 % (ref 43.0–77.0)
Platelets: 278 10*3/uL (ref 150.0–400.0)
RBC: 4.73 Mil/uL (ref 3.87–5.11)
RDW: 14.2 % (ref 11.5–15.5)
WBC: 5.8 10*3/uL (ref 4.0–10.5)

## 2014-09-23 LAB — LUTEINIZING HORMONE: LH: 4.55 m[IU]/mL

## 2014-09-23 LAB — FOLLICLE STIMULATING HORMONE: FSH: 8.2 m[IU]/mL

## 2014-09-23 LAB — T3, FREE: T3, Free: 2.9 pg/mL (ref 2.3–4.2)

## 2014-09-23 LAB — T4: T4, Total: 8.8 ug/dL (ref 4.5–12.0)

## 2014-09-23 LAB — T4, FREE: Free T4: 1.08 ng/dL (ref 0.60–1.60)

## 2014-09-23 LAB — TSH: TSH: 2.18 u[IU]/mL (ref 0.35–4.50)

## 2014-09-23 NOTE — Assessment & Plan Note (Signed)
Over the last year she has had a significant increase in her TSh level and she has s/s suggestive of thyroid disease, will start synthroid Will recheck her TFT's today and will screen for Grave's disease

## 2014-09-23 NOTE — Progress Notes (Signed)
   Subjective:    Patient ID: [name], female    DOB: 1976/06/19, 39 y.o.   MRN: [id number]  Thyroid Problem Presents for follow-up visit. The condition has lasted for 2 weeks. Symptoms include diaphoresis (hot flashes), dry skin, fatigue, hair loss and weight gain. Patient reports no anxiety, cold intolerance, constipation, depressed mood, diarrhea, heat intolerance, hoarse voice, leg swelling, nail problem, palpitations, tremors, visual change or weight loss. The symptoms have been worsening. Past treatments include nothing. The treatment provided no relief.      Review of Systems  Constitutional: Positive for weight gain, diaphoresis (hot flashes), fatigue and unexpected weight change. Negative for fever, chills, weight loss and appetite change.  HENT: Negative.  Negative for hoarse voice.   Eyes: Negative.   Respiratory: Negative.  Negative for cough, choking, chest tightness, shortness of breath and stridor.   Cardiovascular: Negative.  Negative for chest pain, palpitations and leg swelling.  Gastrointestinal: Negative.  Negative for nausea, vomiting, abdominal pain, diarrhea and constipation.  Endocrine: Negative.  Negative for cold intolerance and heat intolerance.  Genitourinary: Negative.   Musculoskeletal: Negative.  Negative for myalgias, back pain, joint swelling and arthralgias.  Skin: Negative for rash.  Allergic/Immunologic: Negative.   Neurological: Negative.  Negative for dizziness, tremors, speech difficulty, light-headedness and headaches.  Hematological: Negative.   Psychiatric/Behavioral: Negative.  The patient is not nervous/anxious.        Objective:   Physical Exam  Constitutional: She is oriented to person, place, and time. She appears well-developed and well-nourished. No distress.  HENT:  Head: Normocephalic and atraumatic.  Mouth/Throat: Oropharynx is clear and moist. No oropharyngeal exudate.  Eyes: Conjunctivae are normal. Right eye  exhibits no discharge. Left eye exhibits no discharge. No scleral icterus.  Neck: Normal range of motion. Neck supple. No JVD present. No tracheal deviation present. No thyromegaly present.  Cardiovascular: Normal rate, regular rhythm, normal heart sounds and intact distal pulses.  Exam reveals no gallop and no friction rub.   No murmur heard. Pulmonary/Chest: Effort normal and breath sounds normal. No stridor. No respiratory distress. She has no wheezes. She has no rales. She exhibits no tenderness.  Abdominal: Soft. Bowel sounds are normal. She exhibits no distension and no mass. There is no tenderness. There is no rebound and no guarding.  Musculoskeletal: Normal range of motion. She exhibits no edema or tenderness.  Lymphadenopathy:    She has no cervical adenopathy.  Neurological: She is oriented to person, place, and time.  Skin: Skin is warm and dry. No rash noted. She is not diaphoretic. No erythema. No pallor.  Psychiatric: She has a normal mood and affect. Her behavior is normal. Judgment and thought content normal.  Vitals reviewed.    Lab Results  Component Value Date   WBC 8.2 02/12/2014   HGB 14.0 02/12/2014   HCT 42.6 02/12/2014   PLT 266.0 02/12/2014   GLUCOSE 76 02/12/2014   CHOL 187 12/26/2011   TRIG 79 12/26/2011   HDL 51 12/26/2011   LDLCALC 120 12/26/2011   ALT 18 09/11/2014   AST 16 09/11/2014   NA 138 09/11/2014   K 4.5 09/11/2014   CL 106 02/12/2014   CREATININE 1.0 09/11/2014   BUN 8 09/11/2014   CO2 24 02/12/2014   TSH 3.33 09/11/2014   HGBA1C 5.5 09/11/2014       Assessment & Plan:

## 2014-09-23 NOTE — Assessment & Plan Note (Signed)
Will check a LH and FSH to see if she is in menopause

## 2014-09-24 LAB — THYROID PEROXIDASE ANTIBODY: Thyroperoxidase Ab SerPl-aCnc: 2 IU/mL (ref <9)

## 2014-10-17 ENCOUNTER — Encounter: Payer: Self-pay | Admitting: Gastroenterology

## 2014-10-30 ENCOUNTER — Ambulatory Visit (AMBULATORY_SURGERY_CENTER): Payer: Self-pay | Admitting: *Deleted

## 2014-10-30 VITALS — Ht 63.0 in | Wt 172.6 lb

## 2014-10-30 DIAGNOSIS — Z1509 Genetic susceptibility to other malignant neoplasm: Secondary | ICD-10-CM

## 2014-10-30 MED ORDER — NA SULFATE-K SULFATE-MG SULF 17.5-3.13-1.6 GM/177ML PO SOLN: 1.0000 | Freq: Once | ORAL | Status: DC

## 2014-10-30 NOTE — Progress Notes (Signed)
Denies allergies to eggs or soy products. Denies complications with sedation or anesthesia. Denies O2 use. Denies use of diet or weight loss medications.  Emmi instructions declined for colonoscopy.  

## 2014-11-06 ENCOUNTER — Encounter: Payer: Self-pay | Admitting: Gastroenterology

## 2014-11-10 ENCOUNTER — Other Ambulatory Visit: Payer: Self-pay | Admitting: Gastroenterology

## 2014-11-10 ENCOUNTER — Encounter: Payer: Self-pay | Admitting: Gastroenterology

## 2014-11-10 ENCOUNTER — Ambulatory Visit (AMBULATORY_SURGERY_CENTER): Payer: BLUE CROSS/BLUE SHIELD | Admitting: Gastroenterology

## 2014-11-10 VITALS — BP 130/86 | HR 80 | Temp 98.8°F | Resp 38 | Ht 63.0 in | Wt 172.0 lb

## 2014-11-10 DIAGNOSIS — D125 Benign neoplasm of sigmoid colon: Secondary | ICD-10-CM | POA: Diagnosis not present

## 2014-11-10 DIAGNOSIS — Z1509 Genetic susceptibility to other malignant neoplasm: Secondary | ICD-10-CM

## 2014-11-10 DIAGNOSIS — Z1211 Encounter for screening for malignant neoplasm of colon: Secondary | ICD-10-CM

## 2014-11-10 DIAGNOSIS — Z1507 Genetic susceptibility to malignant neoplasm of urinary tract: Secondary | ICD-10-CM

## 2014-11-10 DIAGNOSIS — Z800 Family history of malignant neoplasm of digestive organs: Secondary | ICD-10-CM | POA: Diagnosis not present

## 2014-11-10 DIAGNOSIS — K635 Polyp of colon: Secondary | ICD-10-CM

## 2014-11-10 MED ORDER — SODIUM CHLORIDE 0.9 % IV SOLN: 500.0000 mL | INTRAVENOUS | Status: DC

## 2014-11-10 NOTE — Progress Notes (Signed)
Called to room to assist during endoscopic procedure.  Patient ID and intended procedure confirmed with present staff. Received instructions for my participation in the procedure from the performing physician.  

## 2014-11-10 NOTE — Op Note (Addendum)
[facility]  [address] [place], [id number]   COLONOSCOPY PROCEDURE REPORT  PATIENT: [name], [name]  MR#: [id number] BIRTHDATE: 10-Dec-1975 , 39  yrs. old GENDER: female ENDOSCOPIST: Ladene Artist, MD, [facility] PROCEDURE DATE:  11/10/2014 PROCEDURE:   Colonoscopy, screening and Colonoscopy with biopsy First Screening Colonoscopy - Avg.  risk and is 50 yrs.  old or older - No.  Prior Negative Screening - Now for repeat screening. Less than 10 yrs Prior Negative Screening - Now for repeat screening.  Above average risk  History of Adenoma - Now for follow-up colonoscopy & has been > or = to 3 yrs.  N/A  Polyps Removed Today ASA CLASS:   Class II INDICATIONS:Screening for colonic neoplasia, FH Colon or Rectal Adenocarcinoma, FH High Risk Genetic Family Cancer Syndrome, and PH High Risk Genetic Family Cancer Syndrome     . MEDICATIONS: Monitored anesthesia care and Propofol 400 mg IV DESCRIPTION OF PROCEDURE:   After the risks benefits and alternatives of the procedure were thoroughly explained, informed consent was obtained.  The digital rectal exam revealed no abnormalities of the rectum.   The LB [id number]  endoscope was introduced through the anus and advanced to the cecum, which was identified by both the appendix and ileocecal valve. No adverse events experienced.   The quality of the prep was excellent. (Suprep was used)  The instrument was then slowly withdrawn as the colon was fully examined.    COLON FINDINGS: There was mild diverticulosis noted in the transverse colon and ascending colon.  A 4 mm  sessile polyp was found in the sigmoid colon. Polypectomy performed with cold forceps. The polyps was completely removed and retrieved. The examination was otherwise normal.   The colonic mucosa appeared normal in the descending colon, sigmoid colon, rectum, at the splenic flexure, cecum, ileocecal valve, and hepatic flexure. Retroflexed views  revealed internal Grade I hemorrhoids. The time to cecum = 1.8 Withdrawal time = 9.6   The scope was withdrawn and the procedure completed. COMPLICATIONS: There were no immediate complications.  ENDOSCOPIC IMPRESSION: 1.   Mild diverticulosis in the transverse colon and ascending colon  2.   Sessile polyp in the sigmoid colon; polypectomy performed by cold biopsy 3.   Grade I internal hemorrhoids  RECOMMENDATIONS: 1.  Await pathology results 2.  Repeat Colonoscopy in 1 year  eSigned:  Ladene Artist, MD, [facility] 11/10/2014 12:08 PM Revised: 11/10/2014 12:08 PM

## 2014-11-10 NOTE — Progress Notes (Signed)
To recovery, report to [place], RN, VSS 

## 2014-11-10 NOTE — Patient Instructions (Signed)
YOU HAD AN ENDOSCOPIC PROCEDURE TODAY AT [facility]:   Refer to the procedure report that was given to you for any specific questions about what was found during the examination.  If the procedure report does not answer your questions, please call your gastroenterologist to clarify.  If you requested that your care partner not be given the details of your procedure findings, then the procedure report has been included in a sealed envelope for you to review at your convenience later.  YOU SHOULD EXPECT: Some feelings of bloating in the abdomen. Passage of more gas than usual.  Walking can help get rid of the air that was put into your GI tract during the procedure and reduce the bloating. If you had a lower endoscopy (such as a colonoscopy or flexible sigmoidoscopy) you may notice spotting of blood in your stool or on the toilet paper. If you underwent a bowel prep for your procedure, you may not have a normal bowel movement for a few days.  Please Note:  You might notice some irritation and congestion in your nose or some drainage.  This is from the oxygen used during your procedure.  There is no need for concern and it should clear up in a day or so.  SYMPTOMS TO REPORT IMMEDIATELY:   Following lower endoscopy (colonoscopy or flexible sigmoidoscopy):  Excessive amounts of blood in the stool  Significant tenderness or worsening of abdominal pains  Swelling of the abdomen that is new, acute  Fever of 100F or higher   For urgent or emergent issues, a gastroenterologist can be reached at any hour by calling [phone].   DIET: Your first meal following the procedure should be a small meal and then it is ok to progress to your normal diet. Heavy or fried foods are harder to digest and may make you feel nauseous or bloated.  Likewise, meals heavy in dairy and vegetables can increase bloating.  Drink plenty of fluids but you should avoid alcoholic beverages for 24 hours. Try to  increase the fiber in your diet due to Diverticulosis.  ACTIVITY:  You should plan to take it easy for the rest of today and you should NOT DRIVE or use heavy machinery until tomorrow (because of the sedation medicines used during the test).    FOLLOW UP: Our staff will call the number listed on your records the next business day following your procedure to check on you and address any questions or concerns that you may have regarding the information given to you following your procedure. If we do not reach you, we will leave a message.  However, if you are feeling well and you are not experiencing any problems, there is no need to return our call.  We will assume that you have returned to your regular daily activities without incident.  If any biopsies were taken you will be contacted by phone or by letter within the next 1-3 weeks.  Please call us at [phone] if you have not heard about the biopsies in 3 weeks.    SIGNATURES/CONFIDENTIALITY: You and/or your care partner have signed paperwork which will be entered into your electronic medical record.  These signatures attest to the fact that that the information above on your After Visit Summary has been reviewed and is understood.  Full responsibility of the confidentiality of this discharge information lies with you and/or your care-partner.  Read the handouts given to you by your recovery room nurse.

## 2014-11-11 ENCOUNTER — Telehealth: Payer: Self-pay | Admitting: *Deleted

## 2014-11-11 NOTE — Telephone Encounter (Signed)
  Follow up Call-  Call back number 11/10/2014 12/11/2013 12/06/2012  Post procedure Call Back phone  # [phone] cell  Permission to leave phone message Yes Yes Yes     Patient questions:  Do you have a fever, pain , or abdominal swelling? No. Pain Score  0 *  Have you tolerated food without any problems? Yes.    Have you been able to return to your normal activities? Yes.    Do you have any questions about your discharge instructions: Diet   No. Medications  No. Follow up visit  No.  Do you have questions or concerns about your Care? No.  Actions: * If pain score is 4 or above: No action needed, pain <4.

## 2014-11-18 ENCOUNTER — Encounter: Payer: Self-pay | Admitting: Gastroenterology

## 2014-11-19 LAB — HM COLONOSCOPY

## 2014-12-12 ENCOUNTER — Ambulatory Visit (INDEPENDENT_AMBULATORY_CARE_PROVIDER_SITE_OTHER): Payer: BLUE CROSS/BLUE SHIELD | Admitting: Cardiology

## 2014-12-12 ENCOUNTER — Encounter: Payer: Self-pay | Admitting: Cardiology

## 2014-12-12 VITALS — BP 122/90 | HR 87 | Ht 64.0 in | Wt 171.0 lb

## 2014-12-12 DIAGNOSIS — I340 Nonrheumatic mitral (valve) insufficiency: Secondary | ICD-10-CM

## 2014-12-12 DIAGNOSIS — I341 Nonrheumatic mitral (valve) prolapse: Secondary | ICD-10-CM
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
## 2014-12-12 NOTE — Patient Instructions (Addendum)
Your physician wants you to follow-up in: [place] will receive a reminder letter in the mail two months in advance. If you don't receive a letter, please call our office to schedule the follow-up appointment.  Your physician has requested that you have an echocardiogram. Echocardiography is a painless test that uses sound waves to create images of your heart. It provides your doctor with information about the size and shape of your heart and how well your heart's chambers and valves are working. This procedure takes approximately one hour. There are no restrictions for this procedure. 1 YEAR

## 2014-12-31 ENCOUNTER — Encounter: Payer: BLUE CROSS/BLUE SHIELD | Admitting: Gastroenterology

## 2015-01-14 ENCOUNTER — Ambulatory Visit: Payer: BLUE CROSS/BLUE SHIELD | Admitting: Cardiology

## 2015-07-08 ENCOUNTER — Encounter: Payer: Self-pay | Admitting: Family

## 2015-07-08 ENCOUNTER — Ambulatory Visit (INDEPENDENT_AMBULATORY_CARE_PROVIDER_SITE_OTHER): Payer: BLUE CROSS/BLUE SHIELD | Admitting: Family

## 2015-07-08 VITALS — BP 112/68 | HR 96 | Temp 99.6°F | Resp 20 | Ht 63.0 in | Wt 176.2 lb

## 2015-07-08 DIAGNOSIS — R059 Cough, unspecified: Secondary | ICD-10-CM | POA: Insufficient documentation

## 2015-07-08 DIAGNOSIS — R05 Cough: Secondary | ICD-10-CM | POA: Diagnosis not present

## 2015-07-08 MED ORDER — HYDROCODONE-HOMATROPINE 5-1.5 MG/5ML PO SYRP: 5.0000 mL | ORAL_SOLUTION | Freq: Three times a day (TID) | ORAL | Status: DC | PRN

## 2015-07-08 MED ORDER — AZITHROMYCIN 250 MG PO TABS: ORAL_TABLET | ORAL | Status: DC
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
## 2015-07-08 NOTE — Progress Notes (Signed)
Pre visit review using our clinic review tool, if applicable. No additional management support is needed unless otherwise documented below in the visit note. 

## 2015-07-08 NOTE — Assessment & Plan Note (Signed)
Symptoms and exam consistent with Acute upper respiratory infection however cannot rule out early-onset bronchitis. Start azithromycin. Start Hycodan as needed for cough and sleep. Continue over the counter medications as needed for symptom relief and supportive care. Follow-up if symptoms worsen or fail to improve.

## 2015-07-08 NOTE — Patient Instructions (Signed)
Thank you for choosing [facility].  Summary/Instructions:  Your prescription(s) have been submitted to your pharmacy or been printed and provided for you. Please take as directed and contact our office if you believe you are having problem(s) with the medication(s) or have any questions.  If your symptoms worsen or fail to improve, please contact our office for further instruction, or in case of emergency go directly to the emergency room at the closest medical facility.    General Recommendations:    Please drink plenty of fluids.  Get plenty of rest   Sleep in humidified air  Use saline nasal sprays  Netti pot   OTC Medications:  Decongestants - helps relieve congestion   Flonase (generic fluticasone) or Nasacort (generic triamcinolone) - please make sure to use the "cross-over" technique at a 45 degree angle towards the opposite eye as opposed to straight up the nasal passageway.   Sudafed (generic pseudoephedrine - Note this is the one that is available behind the pharmacy counter); Products with phenylephrine (-PE) may also be used but is often not as effective as pseudoephedrine.   If you have HIGH BLOOD PRESSURE - Coricidin HBP; AVOID any product that is -D as this contains pseudoephedrine which may increase your blood pressure.  Afrin (oxymetazoline) every 6-8 hours for up to 3 days.   Allergies - helps relieve runny nose, itchy eyes and sneezing   Claritin (generic loratidine), Allegra (fexofenidine), or Zyrtec (generic cyrterizine) for runny nose. These medications should not cause drowsiness.  Note - Benadryl (generic diphenhydramine) may be used however may cause drowsiness  Cough -   Delsym or Robitussin (generic dextromethorphan)  Expectorants - helps loosen mucus to ease removal   Mucinex (generic guaifenesin) as directed on the package.  Headaches / General Aches   Tylenol (generic acetaminophen) - DO NOT EXCEED 3 grams (3,000 mg) in a 24  hour time period  Advil/Motrin (generic ibuprofen)   Sore Throat -   Salt water gargle   Chloraseptic (generic benzocaine) spray or lozenges / Sucrets (generic dyclonine)    Upper Respiratory Infection, Adult Most upper respiratory infections (URIs) are a viral infection of the air passages leading to the lungs. A URI affects the nose, throat, and upper air passages. The most common type of URI is nasopharyngitis and is typically referred to as "the common cold." URIs run their course and usually go away on their own. Most of the time, a URI does not require medical attention, but sometimes a bacterial infection in the upper airways can follow a viral infection. This is called a secondary infection. Sinus and middle ear infections are common types of secondary upper respiratory infections. Bacterial pneumonia can also complicate a URI. A URI can worsen asthma and chronic obstructive pulmonary disease (COPD). Sometimes, these complications can require emergency medical care and may be life threatening.  CAUSES Almost all URIs are caused by viruses. A virus is a type of germ and can spread from one person to another.  RISKS FACTORS You may be at risk for a URI if:   You smoke.   You have chronic heart or lung disease.  You have a weakened defense (immune) system.   You are very young or very old.   You have nasal allergies or asthma.  You work in crowded or poorly ventilated areas.  You work in health care facilities or schools. SIGNS AND SYMPTOMS  Symptoms typically develop 2-3 days after you come in contact with a cold virus. Most   viral URIs last 7-10 days. However, viral URIs from the influenza virus (flu virus) can last 14-18 days and are typically more severe. Symptoms may include:   Runny or stuffy (congested) nose.   Sneezing.   Cough.   Sore throat.   Headache.   Fatigue.   Fever.   Loss of appetite.   Pain in your forehead, behind your eyes, and  over your cheekbones (sinus pain).  Muscle aches.  DIAGNOSIS  Your health care provider may diagnose a URI by:  Physical exam.  Tests to check that your symptoms are not due to another condition such as:  Strep throat.  Sinusitis.  Pneumonia.  Asthma. TREATMENT  A URI goes away on its own with time. It cannot be cured with medicines, but medicines may be prescribed or recommended to relieve symptoms. Medicines may help:  Reduce your fever.  Reduce your cough.  Relieve nasal congestion. HOME CARE INSTRUCTIONS   Take medicines only as directed by your health care provider.   Gargle warm saltwater or take cough drops to comfort your throat as directed by your health care provider.  Use a warm mist humidifier or inhale steam from a shower to increase air moisture. This may make it easier to breathe.  Drink enough fluid to keep your urine clear or pale yellow.   Eat soups and other clear broths and maintain good nutrition.   Rest as needed.   Return to work when your temperature has returned to normal or as your health care provider advises. You may need to stay home longer to avoid infecting others. You can also use a face mask and careful hand washing to prevent spread of the virus.  Increase the usage of your inhaler if you have asthma.   Do not use any tobacco products, including cigarettes, chewing tobacco, or electronic cigarettes. If you need help quitting, ask your health care provider. PREVENTION  The best way to protect yourself from getting a cold is to practice good hygiene.   Avoid oral or hand contact with people with cold symptoms.   Wash your hands often if contact occurs.  There is no clear evidence that vitamin C, vitamin E, echinacea, or exercise reduces the chance of developing a cold. However, it is always recommended to get plenty of rest, exercise, and practice good nutrition.  SEEK MEDICAL CARE IF:   You are getting worse rather than  better.   Your symptoms are not controlled by medicine.   You have chills.  You have worsening shortness of breath.  You have brown or red mucus.  You have yellow or brown nasal discharge.  You have pain in your face, especially when you bend forward.  You have a fever.  You have swollen neck glands.  You have pain while swallowing.  You have white areas in the back of your throat. SEEK IMMEDIATE MEDICAL CARE IF:   You have severe or persistent:  Headache.  Ear pain.  Sinus pain.  Chest pain.  You have chronic lung disease and any of the following:  Wheezing.  Prolonged cough.  Coughing up blood.  A change in your usual mucus.  You have a stiff neck.  You have changes in your:  Vision.  Hearing.  Thinking.  Mood. MAKE SURE YOU:   Understand these instructions.  Will watch your condition.  Will get help right away if you are not doing well or get worse.   This information is not intended to replace advice   given to you by your health care provider. Make sure you discuss any questions you have with your health care provider.   Document Released: 12/14/2000 Document Revised: 11/04/2014 Document Reviewed: 09/25/2013 Elsevier Interactive Patient Education 2016 [employer].  

## 2015-07-30 ENCOUNTER — Telehealth: Payer: Self-pay | Admitting: Gastroenterology

## 2015-07-30 NOTE — Telephone Encounter (Signed)
Please advise Dr. Fuller Plan if she can be scheduled sooner.

## 2015-07-30 NOTE — Telephone Encounter (Signed)
Pt scheduled  

## 2015-07-30 NOTE — Telephone Encounter (Signed)
OK 

## 2015-08-12 ENCOUNTER — Other Ambulatory Visit: Payer: Self-pay | Admitting: Otolaryngology

## 2015-08-12 DIAGNOSIS — R1314 Dysphagia, pharyngoesophageal phase: Secondary | ICD-10-CM

## 2015-08-13 ENCOUNTER — Ambulatory Visit
Admission: RE | Admit: 2015-08-13 | Discharge: 2015-08-13 | Disposition: A | Discharge status: Home / Self Care | Payer: BLUE CROSS/BLUE SHIELD | Source: Ambulatory Visit | Attending: Otolaryngology | Admitting: Otolaryngology

## 2015-08-13 DIAGNOSIS — R1314 Dysphagia, pharyngoesophageal phase: Secondary | ICD-10-CM

## 2015-08-24 ENCOUNTER — Telehealth: Payer: Self-pay | Admitting: *Deleted

## 2015-08-24 NOTE — Telephone Encounter (Signed)
Patient rescheduled PV for tomorrow.

## 2015-08-24 NOTE — Telephone Encounter (Signed)
Patient No show PV today. Called patient, no answer, left message for patient to call us back to reschedule PV appointment.

## 2015-08-25 ENCOUNTER — Ambulatory Visit (AMBULATORY_SURGERY_CENTER): Payer: Self-pay

## 2015-08-25 VITALS — Ht 64.0 in | Wt 177.4 lb

## 2015-08-25 DIAGNOSIS — Z800 Family history of malignant neoplasm of digestive organs: Secondary | ICD-10-CM

## 2015-08-25 MED ORDER — NA SULFATE-K SULFATE-MG SULF 17.5-3.13-1.6 GM/177ML PO SOLN: ORAL | Status: DC

## 2015-08-25 NOTE — Progress Notes (Signed)
Per pt, no allergies to soy or egg products.Pt not taking any weight loss meds or using  O2 at home. 

## 2015-09-07 ENCOUNTER — Encounter: Payer: Self-pay | Admitting: Gastroenterology

## 2015-09-07 ENCOUNTER — Ambulatory Visit (AMBULATORY_SURGERY_CENTER): Payer: BLUE CROSS/BLUE SHIELD | Admitting: Gastroenterology

## 2015-09-07 VITALS — BP 135/93 | HR 77 | Temp 97.7°F | Resp 18 | Ht 64.0 in | Wt 177.0 lb

## 2015-09-07 DIAGNOSIS — Z800 Family history of malignant neoplasm of digestive organs: Secondary | ICD-10-CM | POA: Diagnosis not present

## 2015-09-07 DIAGNOSIS — Z1211 Encounter for screening for malignant neoplasm of colon: Secondary | ICD-10-CM | POA: Diagnosis present

## 2015-09-07 DIAGNOSIS — D124 Benign neoplasm of descending colon: Secondary | ICD-10-CM

## 2015-09-07 LAB — HM COLONOSCOPY

## 2015-09-07 MED ORDER — SODIUM CHLORIDE 0.9 % IV SOLN: 500.0000 mL | INTRAVENOUS | Status: DC

## 2015-09-07 NOTE — Addendum Note (Signed)
Addended by: [name] on: 09/07/2015 03:40 PM   Modules accepted: Miquel Dunn

## 2015-09-07 NOTE — Progress Notes (Signed)
Report to PACU, RN, vss, BBS= Clear.  

## 2015-09-07 NOTE — Patient Instructions (Signed)
Colon polyp x 1 removed today. Handout given on polyps and hemorrhoids. Repeat colonoscopy in 1 year.  Thank you!  YOU HAD AN ENDOSCOPIC PROCEDURE TODAY AT [facility]:   Refer to the procedure report that was given to you for any specific questions about what was found during the examination.  If the procedure report does not answer your questions, please call your gastroenterologist to clarify.  If you requested that your care partner not be given the details of your procedure findings, then the procedure report has been included in a sealed envelope for you to review at your convenience later.  YOU SHOULD EXPECT: Some feelings of bloating in the abdomen. Passage of more gas than usual.  Walking can help get rid of the air that was put into your GI tract during the procedure and reduce the bloating. If you had a lower endoscopy (such as a colonoscopy or flexible sigmoidoscopy) you may notice spotting of blood in your stool or on the toilet paper. If you underwent a bowel prep for your procedure, you may not have a normal bowel movement for a few days.  Please Note:  You might notice some irritation and congestion in your nose or some drainage.  This is from the oxygen used during your procedure.  There is no need for concern and it should clear up in a day or so.  SYMPTOMS TO REPORT IMMEDIATELY:   Following lower endoscopy (colonoscopy or flexible sigmoidoscopy):  Excessive amounts of blood in the stool  Significant tenderness or worsening of abdominal pains  Swelling of the abdomen that is new, acute  Fever of 100F or higher   For urgent or emergent issues, a gastroenterologist can be reached at any hour by calling [phone].   DIET: Your first meal following the procedure should be a small meal and then it is ok to progress to your normal diet. Heavy or fried foods are harder to digest and may make you feel nauseous or bloated.  Likewise, meals heavy in dairy and  vegetables can increase bloating.  Drink plenty of fluids but you should avoid alcoholic beverages for 24 hours.  ACTIVITY:  You should plan to take it easy for the rest of today and you should NOT DRIVE or use heavy machinery until tomorrow (because of the sedation medicines used during the test).    FOLLOW UP: Our staff will call the number listed on your records the next business day following your procedure to check on you and address any questions or concerns that you may have regarding the information given to you following your procedure. If we do not reach you, we will leave a message.  However, if you are feeling well and you are not experiencing any problems, there is no need to return our call.  We will assume that you have returned to your regular daily activities without incident.  If any biopsies were taken you will be contacted by phone or by letter within the next 1-3 weeks.  Please call us at [phone] if you have not heard about the biopsies in 3 weeks.    SIGNATURES/CONFIDENTIALITY: You and/or your care partner have signed paperwork which will be entered into your electronic medical record.  These signatures attest to the fact that that the information above on your After Visit Summary has been reviewed and is understood.  Full responsibility of the confidentiality of this discharge information lies with you and/or your care-partner.

## 2015-09-07 NOTE — Op Note (Signed)
[address]  [address] [place], [address]   COLONOSCOPY PROCEDURE REPORT  PATIENT: Delta, Simard  MR#: [id number] BIRTHDATE: [birth year] , 40  yrs. old GENDER: female ENDOSCOPIST: Ladene Artist, MD, [facility] PROCEDURE DATE:  09/07/2015 PROCEDURE:   Colonoscopy, screening and Colonoscopy with biopsy First Screening Colonoscopy - Avg.  risk and is 50 yrs.  old or older - No.  Prior Negative Screening - Now for repeat screening. Less than 10 yrs Prior Negative Screening - Now for repeat screening.  Above average risk  History of Adenoma - Now for follow-up colonoscopy & has been > or = to 3 yrs.  N/A  Polyps removed today? Yes ASA CLASS:   Class II INDICATIONS:Screening for colonic neoplasia, FH High Risk Genetic Family Cancer Syndrome, and PH High Risk Genetic Family Cancer Syndrome     . MEDICATIONS: Monitored anesthesia care and Propofol 300 mg IV  DESCRIPTION OF PROCEDURE:   After the risks benefits and alternatives of the procedure were thoroughly explained, informed consent was obtained.  The digital rectal exam revealed small external hemorrhoidal tags.   The LB [id number]  endoscope was introduced through the anus and advanced to the cecum, which was identified by both the appendix and ileocecal valve. No adverse events experienced.   The quality of the prep was excellent. (Suprep was used)  The instrument was then slowly withdrawn as the colon was fully examined. Estimated blood loss is zero unless otherwise noted in this procedure report.   COLON FINDINGS: A sessile polyp measuring 4 mm in size was found in the descending colon.  A polypectomy was performed with cold forceps.  The resection was complete, the polyp tissue was completely retrieved and sent to histology.   The colonic mucosa appeared normal at the splenic flexure, in the transverse colon, rectum, sigmoid colon, at the ileocecal valve, cecum, hepatic flexure, and in the  ascending colon.  Retroflexed views revealed internal small Grade I hemorrhoids. The time to cecum = 3.7 Withdrawal time = 9.9   The scope was withdrawn and the procedure completed. COMPLICATIONS: There were no immediate complications.  ENDOSCOPIC IMPRESSION: 1.   Sessile polyp in the descending colon; polypectomy performed with cold forceps 2.   Grade I internal hemorrhoids  RECOMMENDATIONS: 1.  Await pathology results 2.  Repeat Colonoscopy in 1 year  eSigned:  Ladene Artist, MD, [facility] 09/07/2015 2:22 PM

## 2015-09-07 NOTE — Progress Notes (Signed)
Called to room to assist during endoscopic procedure.  Patient ID and intended procedure confirmed with present staff. Received instructions for my participation in the procedure from the performing physician.  

## 2015-09-08 ENCOUNTER — Telehealth: Payer: Self-pay | Admitting: Gastroenterology

## 2015-09-08 ENCOUNTER — Telehealth: Payer: Self-pay

## 2015-09-08 MED ORDER — HYDROCORTISONE 2.5 % RE CREA: 1.0000 "application " | TOPICAL_CREAM | Freq: Two times a day (BID) | RECTAL | Status: DC

## 2015-09-08 NOTE — Telephone Encounter (Signed)
Patient reports that Dr. Fuller Plan discussed referral to a surgeon for hemorrhoids.  Dr. Fuller Plan ok to refer?

## 2015-09-08 NOTE — Telephone Encounter (Signed)
She had small external tags and small internal hemorrhoids. If her symptoms are minor I recommend hemorrhoidal creams and supp bid as needed. Please refer to Dr. Marcello Moores if she wants a referral.

## 2015-09-08 NOTE — Telephone Encounter (Signed)
  Follow up Call-  Call back number 09/07/2015 11/10/2014 12/11/2013  Post procedure Call Back phone  # [phone]  Permission to leave phone message Yes Yes Yes    Patient was called for follow up after her procedure on 09/07/2015. Patient has returned to her normal activities but states that she is experiencing some lower abdominal gas pain. I asked the patient on a scale of 1 to 10 were would you rate your pain. The patient said "3". I recommended that she use an over the counter gas relief and if her discomfort did not improve to call the office.    Patient questions:  Do you have a fever, pain , or abdominal swelling? Yes.   Pain Score  3 *  Have you tolerated food without any problems? Yes.    Have you been able to return to your normal activities? Yes.    Do you have any questions about your discharge instructions: Diet   No. Medications  No. Follow up visit  No.  Do you have questions or concerns about your Care? No.  Actions: * If pain score is 4 or above: No action needed, pain <4.

## 2015-09-08 NOTE — Telephone Encounter (Signed)
Patient advised she wants to try cream and will call back if she wants to see surgeon

## 2015-09-11 MED FILL — VALACYCLOVIR HCL 500 MG TAB: 500 | 3 days supply | Qty: 6 | Fill #0

## 2015-09-18 ENCOUNTER — Ambulatory Visit (INDEPENDENT_AMBULATORY_CARE_PROVIDER_SITE_OTHER): Payer: BLUE CROSS/BLUE SHIELD | Admitting: Nurse Practitioner

## 2015-09-18 ENCOUNTER — Encounter: Payer: Self-pay | Admitting: Nurse Practitioner

## 2015-09-18 VITALS — BP 100/78 | HR 108 | Temp 98.9°F | Ht 64.0 in | Wt 176.0 lb

## 2015-09-18 DIAGNOSIS — R42 Dizziness and giddiness: Secondary | ICD-10-CM

## 2015-09-18 MED ORDER — METHYLPREDNISOLONE 4 MG PO TABS: ORAL_TABLET | ORAL | Status: DC
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
## 2015-09-18 NOTE — Patient Instructions (Signed)
Prednisone with breakfast or lunch at the latest.  6 tablets on day 1, 5 tablets on day 2, 4 tablets on day 3, 3 tablets on day 4, 2 tablets day 5, 1 tablet on day 6...done! Take tablets all together not spaced out Don't take with NSAIDs (Ibuprofen, Aleve, Naproxen, Meloxicam ect...)

## 2015-09-21 ENCOUNTER — Other Ambulatory Visit: Payer: Self-pay | Admitting: Internal Medicine

## 2015-09-21 ENCOUNTER — Encounter: Payer: Self-pay | Admitting: Internal Medicine

## 2015-09-21 ENCOUNTER — Encounter: Payer: Self-pay | Admitting: Gastroenterology

## 2015-09-21 DIAGNOSIS — R42 Dizziness and giddiness: Secondary | ICD-10-CM | POA: Insufficient documentation

## 2015-09-25 NOTE — Assessment & Plan Note (Signed)
Likely due to viral cause- sinus pressure  New onset  Medrol Dospak sent to pharmacy  Gave instructions verbally and on AVS FU prn worsening/failure to improve.

## 2015-10-14 ENCOUNTER — Ambulatory Visit (INDEPENDENT_AMBULATORY_CARE_PROVIDER_SITE_OTHER): Payer: BLUE CROSS/BLUE SHIELD | Admitting: Neurology

## 2015-10-14 ENCOUNTER — Encounter: Payer: Self-pay | Admitting: Neurology

## 2015-10-14 VITALS — BP 118/74 | HR 99 | Ht 64.0 in | Wt 170.0 lb

## 2015-10-14 DIAGNOSIS — Z800 Family history of malignant neoplasm of digestive organs: Secondary | ICD-10-CM

## 2015-10-14 DIAGNOSIS — R42 Dizziness and giddiness: Secondary | ICD-10-CM

## 2015-10-14 DIAGNOSIS — H538 Other visual disturbances: Secondary | ICD-10-CM | POA: Diagnosis not present

## 2015-10-14 DIAGNOSIS — R4789 Other speech disturbances: Secondary | ICD-10-CM

## 2015-10-14 NOTE — Patient Instructions (Signed)
We will get an MRI of the brain.  If unremarkable, I suspect that the dizziness is related to inner ear issue or stress.  However, you definitely will need a formal eye exam.  If you don't have an eye doctor, we can refer you.

## 2015-10-14 NOTE — Progress Notes (Signed)
Pt does have eye care provider. Will schedule exam.
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
## 2015-10-21 ENCOUNTER — Other Ambulatory Visit: Payer: BLUE CROSS/BLUE SHIELD

## 2015-10-26 ENCOUNTER — Ambulatory Visit
Admission: RE | Admit: 2015-10-26 | Discharge: 2015-10-26 | Disposition: A | Discharge status: Home / Self Care | Payer: BLUE CROSS/BLUE SHIELD | Source: Ambulatory Visit | Attending: Neurology | Admitting: Neurology

## 2015-10-26 ENCOUNTER — Encounter: Payer: Self-pay | Admitting: Gastroenterology

## 2015-10-26 DIAGNOSIS — R42 Dizziness and giddiness: Secondary | ICD-10-CM

## 2015-10-26 DIAGNOSIS — R4789 Other speech disturbances: Secondary | ICD-10-CM

## 2015-10-26 DIAGNOSIS — H538 Other visual disturbances: Secondary | ICD-10-CM

## 2015-10-26 MED ORDER — GADOBENATE DIMEGLUMINE 529 MG/ML IV SOLN
15.0000 mL | Freq: Once | INTRAVENOUS | Status: AC | PRN
  Administered 2015-10-26: 15 mL via INTRAVENOUS

## 2015-10-27 ENCOUNTER — Encounter: Payer: Self-pay | Admitting: Neurology

## 2015-10-27 DIAGNOSIS — R42 Dizziness and giddiness: Secondary | ICD-10-CM

## 2015-10-27 NOTE — Telephone Encounter (Signed)
Mychart reply sent.

## 2015-10-27 NOTE — Telephone Encounter (Signed)
Please see mychart message. Pt requesting MRI results from yesterday. Thanks.

## 2015-10-28 ENCOUNTER — Encounter: Payer: Self-pay | Admitting: Internal Medicine

## 2015-10-28 ENCOUNTER — Ambulatory Visit (INDEPENDENT_AMBULATORY_CARE_PROVIDER_SITE_OTHER): Payer: BLUE CROSS/BLUE SHIELD | Admitting: Internal Medicine

## 2015-10-28 ENCOUNTER — Other Ambulatory Visit (INDEPENDENT_AMBULATORY_CARE_PROVIDER_SITE_OTHER): Payer: BLUE CROSS/BLUE SHIELD

## 2015-10-28 VITALS — BP 130/72 | HR 99 | Temp 98.7°F | Resp 20 | Wt 171.0 lb

## 2015-10-28 DIAGNOSIS — R5383 Other fatigue: Secondary | ICD-10-CM

## 2015-10-28 DIAGNOSIS — R0683 Snoring: Secondary | ICD-10-CM

## 2015-10-28 DIAGNOSIS — F329 Major depressive disorder, single episode, unspecified: Secondary | ICD-10-CM

## 2015-10-28 DIAGNOSIS — R197 Diarrhea, unspecified: Secondary | ICD-10-CM

## 2015-10-28 DIAGNOSIS — F32A Depression, unspecified: Secondary | ICD-10-CM

## 2015-10-28 DIAGNOSIS — E038 Other specified hypothyroidism: Secondary | ICD-10-CM

## 2015-10-28 LAB — BASIC METABOLIC PANEL
BUN: 7 mg/dL (ref 6–23)
CO2: 27 mEq/L (ref 19–32)
Calcium: 9.1 mg/dL (ref 8.4–10.5)
Chloride: 104 mEq/L (ref 96–112)
Creatinine, Ser: 0.87 mg/dL (ref 0.40–1.20)
GFR: 92.15 mL/min (ref >60.00)
Glucose, Bld: 90 mg/dL (ref 70–99)
POTASSIUM: 3.7 meq/L (ref 3.5–5.1)
SODIUM: 137 meq/L (ref 135–145)

## 2015-10-28 LAB — URINALYSIS, ROUTINE W REFLEX MICROSCOPIC
BILIRUBIN URINE: NEGATIVE
Hgb urine dipstick: NEGATIVE
KETONES UR: NEGATIVE
Leukocytes, UA: NEGATIVE
NITRITE: NEGATIVE
Specific Gravity, Urine: 1.02 (ref 1.000–1.030)
Total Protein, Urine: NEGATIVE
Urine Glucose: NEGATIVE
Urobilinogen, UA: 0.2 (ref 0.0–1.0)
pH: 6 (ref 5.0–8.0)

## 2015-10-28 LAB — CBC WITH DIFFERENTIAL/PLATELET
BASOS PCT: 0.4 % (ref 0.0–3.0)
Basophils Absolute: 0 10*3/uL (ref 0.0–0.1)
EOS PCT: 1.2 % (ref 0.0–5.0)
Eosinophils Absolute: 0.1 10*3/uL (ref 0.0–0.7)
HCT: 40.8 % (ref 36.0–46.0)
HEMOGLOBIN: 13.6 g/dL (ref 12.0–15.0)
Lymphocytes Relative: 21 % (ref 12.0–46.0)
Lymphs Abs: 1.2 10*3/uL (ref 0.7–4.0)
MCHC: 33.3 g/dL (ref 30.0–36.0)
MCV: 87.3 fl (ref 78.0–100.0)
MONO ABS: 0.4 10*3/uL (ref 0.1–1.0)
Monocytes Relative: 6.7 % (ref 3.0–12.0)
NEUTROS ABS: 3.9 10*3/uL (ref 1.4–7.7)
Neutrophils Relative %: 70.7 % (ref 43.0–77.0)
Platelets: 287 10*3/uL (ref 150.0–400.0)
RBC: 4.67 Mil/uL (ref 3.87–5.11)
RDW: 14.2 % (ref 11.5–15.5)
WBC: 5.6 10*3/uL (ref 4.0–10.5)

## 2015-10-28 LAB — HEPATIC FUNCTION PANEL
ALT: 11 U/L (ref 0–35)
AST: 11 U/L (ref 0–37)
Albumin: 4.1 g/dL (ref 3.5–5.2)
Alkaline Phosphatase: 51 U/L (ref 39–117)
BILIRUBIN DIRECT: 0.1 mg/dL (ref 0.0–0.3)
BILIRUBIN TOTAL: 0.4 mg/dL (ref 0.2–1.2)
Total Protein: 7.1 g/dL (ref 6.0–8.3)

## 2015-10-28 LAB — TSH: TSH: 1.56 u[IU]/mL (ref 0.35–4.50)

## 2015-10-28 NOTE — Assessment & Plan Note (Signed)
stable overall by history and exam, recent data reviewed with pt, and pt to continue medical treatment as before,  to f/u any worsening symptoms or concerns Lab Results  Component Value Date   TSH 2.18 09/23/2014   For f/u lab today
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
## 2015-10-28 NOTE — Assessment & Plan Note (Addendum)
Etiology unclear, Etiology unclear, Exam otherwise benign, to check labs as documented, follow with expectant management  Note:  Total time for pt hx, exam, review of record with pt in the room, determination of diagnoses and plan for further eval and tx is > 40 min, with over 50% spent in coordination and counseling of patient  

## 2015-10-28 NOTE — Progress Notes (Signed)
Pre visit review using our clinic review tool, if applicable. No additional management support is needed unless otherwise documented below in the visit note. 

## 2015-10-28 NOTE — Assessment & Plan Note (Signed)
stable overall by history and exam, recent data reviewed with pt, and pt to continue medical treatment as before,  to f/u any worsening symptoms or concerns Lab Results  Component Value Date   WBC 5.8 09/23/2014   HGB 13.8 09/23/2014   HCT 40.8 09/23/2014   PLT 278.0 09/23/2014   GLUCOSE 76 02/12/2014   CHOL 187 12/26/2011   TRIG 79 12/26/2011   HDL 51 12/26/2011   LDLCALC 120 12/26/2011   ALT 18 09/11/2014   AST 16 09/11/2014   NA 138 09/11/2014   K 4.5 09/11/2014   CL 106 02/12/2014   CREATININE 1.0 09/11/2014   BUN 8 09/11/2014   CO2 24 02/12/2014   TSH 2.18 09/23/2014   HGBA1C 5.5 09/11/2014   No evidence by hx or exam to support worsening, to continue to monitor

## 2015-10-28 NOTE — Patient Instructions (Addendum)
Please continue all other medications as before, and refills have been done if requested.  Please have the pharmacy call with any other refills you may need.  Please continue your efforts at being more active, low cholesterol diet, and weight control.  Please keep your appointments with your specialists as you may have planned  Please go to the LAB in the Basement (turn left off the elevator) for the tests to be done today  You will be contacted by phone if any changes need to be made immediately.  Otherwise, you will receive a letter about your results with an explanation, but please check with MyChart first.  Please remember to sign up for MyChart if you have not done so, as this will be important to you in the future with finding out test results, communicating by private email, and scheduling acute appointments online when needed.  You are given the work note today  Please call if your snoring and fatigue persists, as we would consider referral to Pulmonary, for consideration for a sleep study

## 2015-10-28 NOTE — Assessment & Plan Note (Signed)
Incidental x 3 days only, ? Viral, for work note, also check CBC, may be improving, declines need for stool studies today, to f/u any persistent or worsening

## 2015-10-28 NOTE — Assessment & Plan Note (Signed)
Not clear of significance, is assoc with AM HA's sometimes, to consider referral for sleep apnea evaluation, declines for now, will check labs first today

## 2015-10-28 NOTE — Addendum Note (Signed)
Addended by: Gerda Diss A on: 10/28/2015 10:59 AM   Modules accepted: Orders

## 2015-11-19 ENCOUNTER — Telehealth: Payer: Self-pay | Admitting: Gastroenterology

## 2015-11-19 MED ORDER — OMEPRAZOLE 40 MG PO CPDR: 40.0000 mg | DELAYED_RELEASE_CAPSULE | Freq: Every day | ORAL | Status: DC

## 2015-11-19 NOTE — Telephone Encounter (Signed)
Pt said she stopped by the pharmacy and they have not received her Omeprazole.Marland Kitchen..Marland Kitchenneeds it resent to pharmacy

## 2015-11-19 NOTE — Telephone Encounter (Signed)
Patient states she has not been taking her omeprazole every day and has been out for some time now. Patient states she has been having some chest pain and reflux symptoms lately and wants to get a refill of omeprazole. I informed patient that she is over due for an office visit and needs to schedule. Patient states she just had a Colonoscopy in March. I informed patient that I will ask Dr. Fuller Plan if this will suffice to does she need a follow ups visit as well. Prescription for one month sent to [facility]  Please advise Dr. Fuller Plan.

## 2015-11-20 MED ORDER — OMEPRAZOLE 40 MG PO CPDR: 40.0000 mg | DELAYED_RELEASE_CAPSULE | Freq: Every day | ORAL | Status: DC

## 2015-11-20 MED FILL — VALACYCLOVIR HCL 500 MG TAB: 500 | 3 days supply | Qty: 6 | Fill #1

## 2015-11-20 NOTE — Telephone Encounter (Signed)
OK to refill thru next March

## 2015-11-20 NOTE — Telephone Encounter (Signed)
Prescription resent with enough refills until next March.

## 2016-02-23 ENCOUNTER — Encounter (HOSPITAL_COMMUNITY): Payer: Self-pay | Admitting: Emergency Medicine

## 2016-02-23 ENCOUNTER — Emergency Department (HOSPITAL_COMMUNITY)
Admission: EM | Admit: 2016-02-23 | Discharge: 2016-02-23 | Disposition: A | Discharge status: Home / Self Care | Payer: Medicaid Other | Attending: Emergency Medicine | Admitting: Emergency Medicine

## 2016-02-23 ENCOUNTER — Emergency Department (HOSPITAL_COMMUNITY): Payer: Medicaid Other

## 2016-02-23 DIAGNOSIS — X509XXA Other and unspecified overexertion or strenuous movements or postures, initial encounter: Secondary | ICD-10-CM | POA: Insufficient documentation

## 2016-02-23 DIAGNOSIS — M25571 Pain in right ankle and joints of right foot: Secondary | ICD-10-CM | POA: Diagnosis not present

## 2016-02-23 DIAGNOSIS — Z859 Personal history of malignant neoplasm, unspecified: Secondary | ICD-10-CM | POA: Diagnosis not present

## 2016-02-23 DIAGNOSIS — Y999 Unspecified external cause status: Secondary | ICD-10-CM | POA: Diagnosis not present

## 2016-02-23 DIAGNOSIS — Y939 Activity, unspecified: Secondary | ICD-10-CM | POA: Diagnosis not present

## 2016-02-23 DIAGNOSIS — Z87891 Personal history of nicotine dependence: Secondary | ICD-10-CM | POA: Diagnosis not present

## 2016-02-23 DIAGNOSIS — S80811A Abrasion, right lower leg, initial encounter: Secondary | ICD-10-CM | POA: Insufficient documentation

## 2016-02-23 DIAGNOSIS — Y9259 Other trade areas as the place of occurrence of the external cause: Secondary | ICD-10-CM | POA: Insufficient documentation

## 2016-02-23 DIAGNOSIS — S8991XA Unspecified injury of right lower leg, initial encounter: Secondary | ICD-10-CM | POA: Diagnosis present

## 2016-02-23 DIAGNOSIS — E039 Hypothyroidism, unspecified: Secondary | ICD-10-CM | POA: Insufficient documentation

## 2016-02-23 MED ORDER — NAPROXEN 500 MG PO TABS: 500.0000 mg | ORAL_TABLET | Freq: Two times a day (BID) | ORAL | 0 refills | Status: DC
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
## 2016-02-23 NOTE — ED Triage Notes (Signed)
Pt sts right ankle pain after twisting and falling on Saturday; some swelling noted

## 2016-02-23 NOTE — Discharge Instructions (Signed)
As we discussed, recommend to ice/elevate ankle at home to help with pain/swelling. Take the prescribed medication as directed. Follow-up with your primary care doctor. You may also wish to follow-up with orthopedics if not improving overt he next week-- call to make appt. Return to the ED for new or worsening symptoms.

## 2016-02-23 NOTE — ED Triage Notes (Signed)
PT refused  Ice .

## 2016-02-23 NOTE — ED Notes (Signed)
Pt departed in NAD, escorted in wheelchair by [facility].

## 2016-05-12 DIAGNOSIS — R590 Localized enlarged lymph nodes: Secondary | ICD-10-CM | POA: Insufficient documentation

## 2016-05-12 DIAGNOSIS — J358 Other chronic diseases of tonsils and adenoids: Secondary | ICD-10-CM | POA: Insufficient documentation

## 2016-05-14 ENCOUNTER — Inpatient Hospital Stay (HOSPITAL_COMMUNITY)
Admission: AD | Admit: 2016-05-14 | Discharge: 2016-05-14 | Disposition: A | Discharge status: Home / Self Care | Payer: BLUE CROSS/BLUE SHIELD | Source: Ambulatory Visit | Attending: Obstetrics and Gynecology | Admitting: Obstetrics and Gynecology

## 2016-05-14 ENCOUNTER — Encounter (HOSPITAL_COMMUNITY): Payer: Self-pay

## 2016-05-14 DIAGNOSIS — R102 Pelvic and perineal pain: Secondary | ICD-10-CM

## 2016-05-14 DIAGNOSIS — Z202 Contact with and (suspected) exposure to infections with a predominantly sexual mode of transmission: Secondary | ICD-10-CM

## 2016-05-14 DIAGNOSIS — I341 Nonrheumatic mitral (valve) prolapse: Secondary | ICD-10-CM | POA: Insufficient documentation

## 2016-05-14 DIAGNOSIS — K219 Gastro-esophageal reflux disease without esophagitis: Secondary | ICD-10-CM | POA: Insufficient documentation

## 2016-05-14 DIAGNOSIS — Z87891 Personal history of nicotine dependence: Secondary | ICD-10-CM | POA: Insufficient documentation

## 2016-05-14 LAB — URINALYSIS, ROUTINE W REFLEX MICROSCOPIC
BILIRUBIN URINE: NEGATIVE
Glucose, UA: NEGATIVE mg/dL
HGB URINE DIPSTICK: NEGATIVE
Ketones, ur: 15 mg/dL — AB
Nitrite: NEGATIVE
PROTEIN: NEGATIVE mg/dL
Specific Gravity, Urine: 1.02 (ref 1.005–1.030)
pH: 6.5 (ref 5.0–8.0)

## 2016-05-14 LAB — WET PREP, GENITAL
Clue Cells Wet Prep HPF POC: NONE SEEN
SPERM: NONE SEEN
Trich, Wet Prep: NONE SEEN
YEAST WET PREP: NONE SEEN

## 2016-05-14 LAB — URINE MICROSCOPIC-ADD ON

## 2016-05-14 MED ORDER — AZITHROMYCIN 250 MG PO TABS
1000.0000 mg | ORAL_TABLET | Freq: Once | ORAL | Status: AC
  Administered 2016-05-14: 1000 mg via ORAL
  Filled 2016-05-14: qty 4

## 2016-05-14 MED ORDER — CEFTRIAXONE SODIUM 250 MG IJ SOLR
250.0000 mg | Freq: Once | INTRAMUSCULAR | Status: AC
  Administered 2016-05-14: 250 mg via INTRAMUSCULAR
  Filled 2016-05-14: qty 250

## 2016-05-14 NOTE — MAU Note (Signed)
Patient presents with onset of vaginal discharge for a few days and vaginal pain was treated for BV 2 weeks ago and the discharge went away.  Now having vaginal pain, discharge with odor, no color patient is concerned it is a STI

## 2016-05-14 NOTE — Discharge Instructions (Signed)
Sexually Transmitted Disease °A sexually transmitted disease (STD) is a disease or infection that may be passed (transmitted) from person to person, usually during sexual activity. This may happen by way of saliva, semen, blood, vaginal mucus, or urine. Common STDs include: °· Gonorrhea. °· Chlamydia. °· Syphilis. °· HIV and AIDS. °· Genital herpes. °· Hepatitis B and C. °· Trichomonas. °· Human papillomavirus (HPV). °· Pubic lice. °· Scabies. °· Mites. °· Bacterial vaginosis. °WHAT ARE CAUSES OF STDs? °An STD may be caused by bacteria, a virus, or parasites. STDs are often transmitted during sexual activity if one person is infected. However, they may also be transmitted through nonsexual means. STDs may be transmitted after:  °· Sexual intercourse with an infected person. °· Sharing sex toys with an infected person. °· Sharing needles with an infected person or using unclean piercing or tattoo needles. °· Having intimate contact with the genitals, mouth, or rectal areas of an infected person. °· Exposure to infected fluids during birth. °WHAT ARE THE SIGNS AND SYMPTOMS OF STDs? °Different STDs have different symptoms. Some people may not have any symptoms. If symptoms are present, they may include: °· Painful or bloody urination. °· Pain in the pelvis, abdomen, vagina, anus, throat, or eyes. °· A skin rash, itching, or irritation. °· Growths, ulcerations, blisters, or sores in the genital and anal areas. °· Abnormal vaginal discharge with or without bad odor. °· Penile discharge in men. °· Fever. °· Pain or bleeding during sexual intercourse. °· Swollen glands in the groin area. °· Yellow skin and eyes (jaundice). This is seen with hepatitis. °· Swollen testicles. °· Infertility. °· Sores and blisters in the mouth. °HOW ARE STDs DIAGNOSED? °To make a diagnosis, your health care provider may: °· Take a medical history. °· Perform a physical exam. °· Take a sample of any discharge to examine. °· Swab the throat,  cervix, opening to the penis, rectum, or vagina for testing. °· Test a sample of your first morning urine. °· Perform blood tests. °· Perform a Pap test, if this applies. °· Perform a colposcopy. °· Perform a laparoscopy. °HOW ARE STDs TREATED? °Treatment depends on the STD. Some STDs may be treated but not cured. °· Chlamydia, gonorrhea, trichomonas, and syphilis can be cured with antibiotic medicine. °· Genital herpes, hepatitis, and HIV can be treated, but not cured, with prescribed medicines. The medicines lessen symptoms. °· Genital warts from HPV can be treated with medicine or by freezing, burning (electrocautery), or surgery. Warts may come back. °· HPV cannot be cured with medicine or surgery. However, abnormal areas may be removed from the cervix, vagina, or vulva. °· If your diagnosis is confirmed, your recent sexual partners need treatment. This is true even if they are symptom-free or have a negative culture or evaluation. They should not have sex until their health care providers say it is okay. °· Your health care provider may test you for infection again 3 months after treatment. °HOW CAN I REDUCE MY RISK OF GETTING AN STD? °Take these steps to reduce your risk of getting an STD: °· Use latex condoms, dental dams, and water-soluble lubricants during sexual activity. Do not use petroleum jelly or oils. °· Avoid having multiple sex partners. °· Do not have sex with someone who has other sex partners °· Do not have sex with anyone you do not know or who is at high risk for an STD. °· Avoid risky sex practices that can break your skin. °· Do not have sex   if you have open sores on your mouth or skin. °· Avoid drinking too much alcohol or taking illegal drugs. Alcohol and drugs can affect your judgment and put you in a vulnerable position. °· Avoid engaging in oral and anal sex acts. °· Get vaccinated for HPV and hepatitis. If you have not received these vaccines in the past, talk to your health care  provider about whether one or both might be right for you. °· If you are at risk of being infected with HIV, it is recommended that you take a prescription medicine daily to prevent HIV infection. This is called pre-exposure prophylaxis (PrEP). You are considered at risk if: °¨ You are a man who has sex with other men (MSM). °¨ You are a heterosexual man or woman and are sexually active with more than one partner. °¨ You take drugs by injection. °¨ You are sexually active with a partner who has HIV. °· Talk with your health care provider about whether you are at high risk of being infected with HIV. If you choose to begin PrEP, you should first be tested for HIV. You should then be tested every 3 months for as long as you are taking PrEP. °WHAT SHOULD I DO IF I THINK I HAVE AN STD? °· See your health care provider. °· Tell your sexual partner(s). They should be tested and treated for any STDs. °· Do not have sex until your health care provider says it is okay. °WHEN SHOULD I GET IMMEDIATE MEDICAL CARE? °Contact your health care provider right away if:  °· You have severe abdominal pain. °· You are a man and notice swelling or pain in your testicles. °· You are a woman and notice swelling or pain in your vagina. °  °This information is not intended to replace advice given to you by your health care provider. Make sure you discuss any questions you have with your health care provider. °  °Document Released: 09/10/2002 Document Revised: 07/11/2014 Document Reviewed: 01/08/2013 °Elsevier Interactive Patient Education ©2016 [employer] Inc. ° °
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
## 2016-05-15 LAB — URINE CULTURE: Culture: NO GROWTH

## 2016-05-16 LAB — GC/CHLAMYDIA PROBE AMP (~~LOC~~) NOT AT ARMC
Chlamydia: POSITIVE — AB
Neisseria Gonorrhea: POSITIVE — AB

## 2016-08-03 ENCOUNTER — Encounter: Payer: Self-pay | Admitting: Gastroenterology

## 2016-08-10 ENCOUNTER — Telehealth: Payer: Self-pay | Admitting: Genetic Counselor

## 2016-08-10 NOTE — Telephone Encounter (Signed)
I returned the patient's call from earlier today.  She had questions on when her children should be tested for Lynch syndrome.  I explained that per NCCN guidelines we would want to start screening with colonoscopy between 20-258.  Therefore we would want to test them for the [facility] in PMS2 prior to getting their first colonoscopy.  We discussed whether her siblings had been tested.  One sister was tested and was negative.  Others have had colonoscopies, but no genetic testing.  Discussed that we would recommend that siblings have genetic testing if possible to determine if they need to be followed more closely.
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
## 2016-08-19 ENCOUNTER — Ambulatory Visit: Payer: BLUE CROSS/BLUE SHIELD | Admitting: Cardiology
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
## 2016-08-22 ENCOUNTER — Encounter: Payer: Self-pay | Admitting: *Deleted

## 2016-08-22 ENCOUNTER — Ambulatory Visit: Payer: Medicaid Other | Admitting: Cardiology

## 2016-09-06 MED FILL — VALACYCLOVIR HCL 500 MG TAB: 500 | 3 days supply | Qty: 6 | Fill #0

## 2016-11-29 ENCOUNTER — Encounter: Payer: Self-pay | Admitting: Gastroenterology

## 2016-12-22 ENCOUNTER — Encounter: Payer: Self-pay | Admitting: Internal Medicine

## 2016-12-22 ENCOUNTER — Ambulatory Visit (INDEPENDENT_AMBULATORY_CARE_PROVIDER_SITE_OTHER)
Admission: RE | Admit: 2016-12-22 | Discharge: 2016-12-22 | Disposition: A | Discharge status: Home / Self Care | Payer: Managed Care, Other (non HMO) | Source: Ambulatory Visit | Attending: Internal Medicine | Admitting: Internal Medicine

## 2016-12-22 ENCOUNTER — Ambulatory Visit (INDEPENDENT_AMBULATORY_CARE_PROVIDER_SITE_OTHER): Payer: Managed Care, Other (non HMO) | Admitting: Internal Medicine

## 2016-12-22 VITALS — BP 130/88 | HR 93 | Temp 98.7°F | Resp 16 | Ht 64.0 in | Wt 178.2 lb

## 2016-12-22 DIAGNOSIS — J069 Acute upper respiratory infection, unspecified: Secondary | ICD-10-CM | POA: Diagnosis not present

## 2016-12-22 DIAGNOSIS — B9789 Other viral agents as the cause of diseases classified elsewhere: Secondary | ICD-10-CM

## 2016-12-22 DIAGNOSIS — R05 Cough: Secondary | ICD-10-CM

## 2016-12-22 DIAGNOSIS — R059 Cough, unspecified: Secondary | ICD-10-CM

## 2016-12-22 MED ORDER — HYDROCODONE-HOMATROPINE 5-1.5 MG/5ML PO SYRP: 5.0000 mL | ORAL_SOLUTION | Freq: Three times a day (TID) | ORAL | 0 refills | Status: DC | PRN

## 2016-12-22 NOTE — Patient Instructions (Signed)
Cough, Adult Coughing is a reflex that clears your throat and your airways. Coughing helps to heal and protect your lungs. It is normal to cough occasionally, but a cough that happens with other symptoms or lasts a long time may be a sign of a condition that needs treatment. A cough may last only 2-3 weeks (acute), or it may last longer than 8 weeks (chronic). What are the causes? Coughing is commonly caused by:  Breathing in substances that irritate your lungs.  A viral or bacterial respiratory infection.  Allergies.  Asthma.  Postnasal drip.  Smoking.  Acid backing up from the stomach into the esophagus (gastroesophageal reflux).  Certain medicines.  Chronic lung problems, including COPD (or rarely, lung cancer).  Other medical conditions such as heart failure.  Follow these instructions at home: Pay attention to any changes in your symptoms. Take these actions to help with your discomfort:  Take medicines only as told by your health care provider. ? If you were prescribed an antibiotic medicine, take it as told by your health care provider. Do not stop taking the antibiotic even if you start to feel better. ? Talk with your health care provider before you take a cough suppressant medicine.  Drink enough fluid to keep your urine clear or pale yellow.  If the air is dry, use a cold steam vaporizer or humidifier in your bedroom or your home to help loosen secretions.  Avoid anything that causes you to cough at work or at home.  If your cough is worse at night, try sleeping in a semi-upright position.  Avoid cigarette smoke. If you smoke, quit smoking. If you need help quitting, ask your health care provider.  Avoid caffeine.  Avoid alcohol.  Rest as needed.  Contact a health care provider if:  You have new symptoms.  You cough up pus.  Your cough does not get better after 2-3 weeks, or your cough gets worse.  You cannot control your cough with suppressant  medicines and you are losing sleep.  You develop pain that is getting worse or pain that is not controlled with pain medicines.  You have a fever.  You have unexplained weight loss.  You have night sweats. Get help right away if:  You cough up blood.  You have difficulty breathing.  Your heartbeat is very fast. This information is not intended to replace advice given to you by your health care provider. Make sure you discuss any questions you have with your health care provider. Document Released: 12/17/2010 Document Revised: 11/26/2015 Document Reviewed: 08/27/2014 Elsevier Interactive Patient Education  2017 Elsevier Inc.  

## 2016-12-22 NOTE — Progress Notes (Signed)
Subjective:  Patient ID: [name], female    DOB: 1975-10-01  Age: 41 y.o. MRN: [id number]  CC: Cough   HPI [name] presents for a 3 day history of nonproductive cough, muscle aches, fever to 101, sore throat and pressure in both ears. She contacted a teledoc and was prescribed Augmentin which has not helped and has caused diarrhea.  Outpatient Medications Prior to Visit  Medication Sig Dispense Refill  . clindamycin (CLEOCIN T) 1 % lotion Apply topically at bedtime as needed. Reported on 08/25/2015  11  . doxylamine, Sleep, (UNISOM) 25 MG tablet Take 25 mg by mouth at bedtime.    . Multiple Vitamins-Minerals (WOMENS HAIR, SKIN & NAILS PO) Take by mouth. Natures Bounty Women's Gummy Vitamin-Take one daily    . tretinoin (RETIN-A) 0.1 % cream Apply topically at bedtime.   11  . Triamcinolone Acetonide (TRIAMCINOLONE 0.1 % CREAM : EUCERIN) CREA Apply 1 application topically as needed.     . valACYclovir (VALTREX) 500 MG tablet Take 500 mg by mouth as needed. Reported on 09/18/2015     No facility-administered medications prior to visit.     ROS Review of Systems  Constitutional: Positive for fever. Negative for chills, diaphoresis, fatigue and unexpected weight change.  HENT: Positive for sore throat. Negative for congestion, facial swelling, sinus pain, sinus pressure, trouble swallowing and voice change.   Eyes: Negative.   Respiratory: Positive for cough. Negative for chest tightness, shortness of breath, wheezing and stridor.   Cardiovascular: Negative.  Negative for chest pain, palpitations and leg swelling.  Gastrointestinal: Positive for diarrhea. Negative for abdominal pain, constipation, nausea and vomiting.  Endocrine: Negative.   Genitourinary: Negative.  Negative for difficulty urinating.  Musculoskeletal: Positive for myalgias. Negative for back pain and neck pain.  Skin: Negative.  Negative for color change and rash.  Allergic/Immunologic:  Negative.   Neurological: Negative.   Hematological: Negative for adenopathy. Does not bruise/bleed easily.  Psychiatric/Behavioral: Negative.     Objective:  BP 130/88 (BP Location: Left Arm, Patient Position: Sitting, Cuff Size: Normal)   Pulse 93   Temp 98.7 F (37.1 C) (Oral)   Resp 16   Ht 5\' 4"  (1.626 m)   Wt 178 lb 4 oz (80.9 kg)   LMP 01/12/2012   SpO2 97%   BMI 30.60 kg/m   BP Readings from Last 3 Encounters:  12/22/16 130/88  05/14/16 126/87  02/23/16 133/80    Wt Readings from Last 3 Encounters:  12/22/16 178 lb 4 oz (80.9 kg)  05/14/16 172 lb (78 kg)  10/28/15 171 lb (77.6 kg)    Physical Exam  Constitutional: She is oriented to person, place, and time.  Non-toxic appearance. She does not have a sickly appearance. She does not appear ill. No distress.  HENT:  Right Ear: Hearing, tympanic membrane, external ear and ear canal normal.  Left Ear: Hearing, external ear and ear canal normal.  Nose: No mucosal edema or rhinorrhea. Right sinus exhibits no maxillary sinus tenderness and no frontal sinus tenderness. Left sinus exhibits no maxillary sinus tenderness and no frontal sinus tenderness.  Mouth/Throat: Oropharynx is clear and moist. Mucous membranes are not pale, not dry and not cyanotic. No oral lesions. No trismus in the jaw. No uvula swelling. No oropharyngeal exudate, posterior oropharyngeal edema, posterior oropharyngeal erythema or tonsillar abscesses.  Eyes: Conjunctivae are normal. Right eye exhibits no discharge. Left eye exhibits no discharge. No scleral icterus.  Neck: Normal range of motion. Neck  supple. No JVD present. No thyromegaly present.  Cardiovascular: Normal rate, regular rhythm and intact distal pulses.  Exam reveals no gallop and no friction rub.   No murmur heard. Pulmonary/Chest: Effort normal and breath sounds normal. No respiratory distress. She has no wheezes. She has no rales. She exhibits no tenderness.  Abdominal: Soft. Bowel  sounds are normal. She exhibits no distension and no mass. There is no tenderness. There is no rebound and no guarding.  Musculoskeletal: Normal range of motion. She exhibits no edema, tenderness or deformity.  Lymphadenopathy:    She has no cervical adenopathy.  Neurological: She is alert and oriented to person, place, and time.  Skin: Skin is warm and dry. No rash noted. She is not diaphoretic. No erythema. No pallor.  Vitals reviewed.   Lab Results  Component Value Date   WBC 5.6 10/28/2015   HGB 13.6 10/28/2015   HCT 40.8 10/28/2015   PLT 287.0 10/28/2015   GLUCOSE 90 10/28/2015   CHOL 187 12/26/2011   TRIG 79 12/26/2011   HDL 51 12/26/2011   LDLCALC 120 12/26/2011   ALT 11 10/28/2015   AST 11 10/28/2015   NA 137 10/28/2015   K 3.7 10/28/2015   CL 104 10/28/2015   CREATININE 0.87 10/28/2015   BUN 7 10/28/2015   CO2 27 10/28/2015   TSH 1.56 10/28/2015   HGBA1C 5.5 09/11/2014    No results found.  Assessment & Plan:   [name] was seen today for cough.  Diagnoses and all orders for this visit:  Cough- her chest x-rays normal -     DG Chest 2 View; Future  Viral URI with cough- her symptoms and exam are consistent with a viral URI. I've asked her to stop taking the Augmentin. Will offer symptom relief with Hycodan as needed. -     HYDROcodone-homatropine (HYCODAN) 5-1.5 MG/5ML syrup; Take 5 mLs by mouth every 8 (eight) hours as needed for cough.   I am having Ms. [name] start on HYDROcodone-homatropine. I am also having her maintain her doxylamine (Sleep), valACYclovir, clindamycin, tretinoin, (Multiple Vitamins-Minerals (WOMENS HAIR, SKIN & NAILS PO)), and triamcinolone 0.1 % cream : eucerin.  Meds ordered this encounter  Medications  . HYDROcodone-homatropine (HYCODAN) 5-1.5 MG/5ML syrup    Sig: Take 5 mLs by mouth every 8 (eight) hours as needed for cough.    Dispense:  120 mL    Refill:  0     Follow-up: Return in about 3 weeks (around  01/12/2017).  [name], MD

## 2016-12-26 ENCOUNTER — Other Ambulatory Visit: Payer: Self-pay | Admitting: Internal Medicine

## 2016-12-26 ENCOUNTER — Encounter: Payer: Self-pay | Admitting: Internal Medicine

## 2017-01-05 ENCOUNTER — Ambulatory Visit (AMBULATORY_SURGERY_CENTER): Payer: Self-pay

## 2017-01-05 VITALS — Ht 63.5 in | Wt 180.0 lb

## 2017-01-05 DIAGNOSIS — Z860100 Personal history of colon polyps, unspecified: Secondary | ICD-10-CM

## 2017-01-05 MED ORDER — SUPREP BOWEL PREP KIT 17.5-3.13-1.6 GM/177ML PO SOLN
1.0000 | Freq: Once | ORAL | 0 refills | Status: AC
Start: 2017-01-05 — End: 2017-01-05

## 2017-01-05 NOTE — Progress Notes (Signed)
No allergies to eggs or soy No diet meds No home oxygen No past problems with anesthesia  Declined emmi 

## 2017-01-10 ENCOUNTER — Encounter: Payer: Self-pay | Admitting: Gastroenterology

## 2017-01-11 ENCOUNTER — Telehealth: Payer: Self-pay | Admitting: Genetics

## 2017-01-11 NOTE — Telephone Encounter (Signed)
Ms. Roes called to request that we send her genetic counseling and testing records to the genetic counselor at [facility] in order to assist genetic evaluation and testing of her family member. Per Ms. Benincasa's request her records were sent to [facility] 01/11/2017.

## 2017-01-16 MED FILL — SUPREP BOWEL PREP KIT: 17.5-3.13-1 | 2 days supply | Qty: 354 | Fill #0

## 2017-01-18 ENCOUNTER — Encounter: Payer: Self-pay | Admitting: Gastroenterology

## 2017-01-18 ENCOUNTER — Ambulatory Visit (AMBULATORY_SURGERY_CENTER): Payer: Managed Care, Other (non HMO) | Admitting: Gastroenterology

## 2017-01-18 VITALS — BP 123/82 | HR 80 | Temp 98.8°F | Resp 25 | Ht 63.5 in | Wt 180.0 lb

## 2017-01-18 DIAGNOSIS — Z800 Family history of malignant neoplasm of digestive organs: Secondary | ICD-10-CM | POA: Diagnosis not present

## 2017-01-18 DIAGNOSIS — Z86010 Personal history of colonic polyps: Secondary | ICD-10-CM | POA: Diagnosis present

## 2017-01-18 DIAGNOSIS — D123 Benign neoplasm of transverse colon: Secondary | ICD-10-CM

## 2017-01-18 LAB — HM COLONOSCOPY

## 2017-01-18 MED ORDER — SODIUM CHLORIDE 0.9 % IV SOLN: 500.0000 mL | INTRAVENOUS | Status: AC

## 2017-01-18 NOTE — Op Note (Signed)
[facility] Patient Name: [name] Procedure Date: 01/18/2017 1:20 PM MRN: [id number] Endoscopist: Ladene Artist , MD Age: 41 Referring MD:  Date of Birth: 10-Jan-1976 Gender: Female Account #: [id number] Procedure:                Colonoscopy Indications:              Colon cancer screening in patient at increased                            risk: Family history of genetic family cancer                            syndrome. Personal history of adenomatous colon                            polyps. Medicines:                Monitored Anesthesia Care Procedure:                Pre-Anesthesia Assessment:                           - Prior to the procedure, a History and Physical                            was performed, and patient medications and                            allergies were reviewed. The patient's tolerance of                            previous anesthesia was also reviewed. The risks                            and benefits of the procedure and the sedation                            options and risks were discussed with the patient.                            All questions were answered, and informed consent                            was obtained. Prior Anticoagulants: The patient has                            taken no previous anticoagulant or antiplatelet                            agents. ASA Grade Assessment: II - A patient with                            mild systemic disease. After reviewing the risks  and benefits, the patient was deemed in                            satisfactory condition to undergo the procedure.                           After obtaining informed consent, the colonoscope                            was passed under direct vision. Throughout the                            procedure, the patient's blood pressure, pulse, and                            oxygen saturations were monitored continuously. The                          Model PCF-[id number]) scope was introduced                            through the anus and advanced to the the cecum,                            identified by appendiceal orifice and ileocecal                            valve. The ileocecal valve, appendiceal orifice,                            and rectum were photographed. The quality of the                            bowel preparation was excellent. The colonoscopy                            was performed without difficulty. The patient                            tolerated the procedure well. Scope In: 1:35:22 PM Scope Out: 1:48:09 PM Scope Withdrawal Time: 0 hours 10 minutes 28 seconds  Total Procedure Duration: 0 hours 12 minutes 47 seconds  Findings:                 The perianal and digital rectal examinations were                            normal except for small external tags.                           A 6 mm polyp was found in the transverse colon. The                            polyp was sessile. The polyp was removed with a  cold snare. Resection and retrieval were complete.                           A few scattered small-mouthed diverticula were                            found in the sigmoid colon, descending colon and                            transverse colon. There was no evidence of                            diverticular bleeding.                           The exam was otherwise without abnormality on                            direct and retroflexion views.                           Internal hemorrhoids were found during                            retroflexion. The hemorrhoids were small and Grade                            I (internal hemorrhoids that do not prolapse). Complications:            No immediate complications. Estimated blood loss:                            None. Estimated Blood Loss:     Estimated blood loss: none. Impression:               - One  6 mm polyp in the transverse colon, removed                            with a cold snare. Resected and retrieved.                           - Mild diverticulosis in the sigmoid colon, in the                            descending colon and in the transverse colon. There                            was no evidence of diverticular bleeding.                           - Internal hemorrhoids Recommendation:           - Repeat colonoscopy in 1 year for surveillance.                           - Patient has a contact number  available for                            emergencies. The signs and symptoms of potential                            delayed complications were discussed with the                            patient. Return to normal activities tomorrow.                            Written discharge instructions were provided to the                            patient.                           - Resume previous diet.                           - Continue present medications.                           - Await pathology results.                           - Preparation H supp pr bid prn. Coat each supp                            with hydrocortisone cream priot to inserting. If                            not effective consider hemorrhoidal banding. Ladene Artist, MD 01/18/2017 1:57:12 PM This report has been signed electronically.

## 2017-01-18 NOTE — Progress Notes (Signed)
Called to room to assist during endoscopic procedure.  Patient ID and intended procedure confirmed with present staff. Received instructions for my participation in the procedure from the performing physician.  

## 2017-01-18 NOTE — Patient Instructions (Signed)
YOU HAD AN ENDOSCOPIC PROCEDURE TODAY AT [facility]:   Refer to the procedure report that was given to you for any specific questions about what was found during the examination.  If the procedure report does not answer your questions, please call your gastroenterologist to clarify.  If you requested that your care partner not be given the details of your procedure findings, then the procedure report has been included in a sealed envelope for you to review at your convenience later.  YOU SHOULD EXPECT: Some feelings of bloating in the abdomen. Passage of more gas than usual.  Walking can help get rid of the air that was put into your GI tract during the procedure and reduce the bloating. If you had a lower endoscopy (such as a colonoscopy or flexible sigmoidoscopy) you may notice spotting of blood in your stool or on the toilet paper. If you underwent a bowel prep for your procedure, you may not have a normal bowel movement for a few days.  Please Note:  You might notice some irritation and congestion in your nose or some drainage.  This is from the oxygen used during your procedure.  There is no need for concern and it should clear up in a day or so.  SYMPTOMS TO REPORT IMMEDIATELY:   Following lower endoscopy (colonoscopy or flexible sigmoidoscopy):  Excessive amounts of blood in the stool  Significant tenderness or worsening of abdominal pains  Swelling of the abdomen that is new, acute  Fever of 100F or higher   For urgent or emergent issues, a gastroenterologist can be reached at any hour by calling [phone].   DIET:  We do recommend a small meal at first, but then you may proceed to your regular diet.  Drink plenty of fluids but you should avoid alcoholic beverages for 24 hours. Try to increase the fiber in your diet, and drink plenty of water.  ACTIVITY:  You should plan to take it easy for the rest of today and you should NOT DRIVE or use heavy machinery until  tomorrow (because of the sedation medicines used during the test).    FOLLOW UP: Our staff will call the number listed on your records the next business day following your procedure to check on you and address any questions or concerns that you may have regarding the information given to you following your procedure. If we do not reach you, we will leave a message.  However, if you are feeling well and you are not experiencing any problems, there is no need to return our call.  We will assume that you have returned to your regular daily activities without incident.  If any biopsies were taken you will be contacted by phone or by letter within the next 1-3 weeks.  Please call us at [phone] if you have not heard about the biopsies in 3 weeks.    SIGNATURES/CONFIDENTIALITY: You and/or your care partner have signed paperwork which will be entered into your electronic medical record.  These signatures attest to the fact that that the information above on your After Visit Summary has been reviewed and is understood.  Full responsibility of the confidentiality of this discharge information lies with you and/or your care-partner.  Read all of the handouts given to you by your recovery room nurse.   Use prepH for your hemorrhoids. If they bother you, call us for a hemorrhoid banding.

## 2017-01-18 NOTE — Progress Notes (Signed)
A and O x3. Report to RN. Tolerated MAC anesthesia well.

## 2017-01-19 ENCOUNTER — Telehealth: Payer: Self-pay | Admitting: *Deleted

## 2017-01-19 NOTE — Telephone Encounter (Signed)
  Follow up Call-  Call back number 01/18/2017 09/07/2015 11/10/2014  Post procedure Call Back phone  # [phone]  Permission to leave phone message Yes Yes Yes  Some recent data might be hidden    [facility]

## 2017-01-19 NOTE — Telephone Encounter (Signed)
Left message on f/u call 

## 2017-01-26 ENCOUNTER — Encounter: Payer: Self-pay | Admitting: Gastroenterology

## 2017-02-04 ENCOUNTER — Ambulatory Visit: Payer: Managed Care, Other (non HMO) | Admitting: Internal Medicine
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
## 2017-03-24 ENCOUNTER — Ambulatory Visit (INDEPENDENT_AMBULATORY_CARE_PROVIDER_SITE_OTHER): Payer: Managed Care, Other (non HMO) | Admitting: Cardiology

## 2017-03-24 VITALS — BP 136/76 | HR 92 | Ht 63.5 in | Wt 183.2 lb

## 2017-03-24 DIAGNOSIS — R002 Palpitations: Secondary | ICD-10-CM | POA: Diagnosis not present

## 2017-03-24 DIAGNOSIS — M7989 Other specified soft tissue disorders: Secondary | ICD-10-CM

## 2017-03-24 DIAGNOSIS — I341 Nonrheumatic mitral (valve) prolapse: Secondary | ICD-10-CM | POA: Diagnosis not present

## 2017-03-24 MED ORDER — FUROSEMIDE 20 MG PO TABS: 20.0000 mg | ORAL_TABLET | Freq: Every day | ORAL | 6 refills | Status: DC | PRN

## 2017-03-24 NOTE — Patient Instructions (Signed)
Medication Instructions:  START- Lasix 20 mg daily as needed  If you need a refill on your cardiac medications before your next appointment, please call your pharmacy.  Labwork: None Ordered   Testing/Procedures: Your physician has recommended that you wear a 24 hour holter monitor. Holter monitors are medical devices that record the heart's electrical activity. Doctors most often use these monitors to diagnose arrhythmias. Arrhythmias are problems with the speed or rhythm of the heartbeat. The monitor is a small, portable device. You can wear one while you do your normal daily activities. This is usually used to diagnose what is causing palpitations/syncope (passing out).  Your physician has requested that you have an echocardiogram. Echocardiography is a painless test that uses sound waves to create images of your heart. It provides your doctor with information about the size and shape of your heart and how well your heart's chambers and valves are working. This procedure takes approximately one hour. There are no restrictions for this procedure.   Follow-Up: Your physician wants you to follow-up in: 1 Year. You should receive a reminder letter in the mail two months in advance. If you do not receive a letter, please call our office [phone].   Thank you for choosing [facility]!!

## 2017-03-25 ENCOUNTER — Other Ambulatory Visit: Payer: Self-pay | Admitting: Nurse Practitioner

## 2017-03-25 MED ORDER — FUROSEMIDE 20 MG PO TABS: 20.0000 mg | ORAL_TABLET | Freq: Every day | ORAL | 6 refills | Status: DC | PRN

## 2017-03-27 ENCOUNTER — Encounter: Payer: Self-pay | Admitting: Cardiology

## 2017-03-27 DIAGNOSIS — M7989 Other specified soft tissue disorders: Secondary | ICD-10-CM | POA: Insufficient documentation

## 2017-04-06 ENCOUNTER — Telehealth: Payer: Self-pay | Admitting: Cardiology

## 2017-04-06 NOTE — Telephone Encounter (Signed)
Returned call to patient of Dr. Percival Spanish. She complains of high BP noted yesterday @ eye doctor - 158/98. She does not monitor her BP at home. She had a faint headache yesterday, she attributed this to allergies but has same headache today as well. BP at last office visit on 9/21 was WNL. No other complaints. Advised that MD would likely not Rx any meds based off single high BP reading but would route to him for any recommendations and she would be notified accordingly.

## 2017-04-06 NOTE — Telephone Encounter (Signed)
Please ask her to check a BP diary and let us know if it is running above 130/80 on average.

## 2017-04-06 NOTE — Telephone Encounter (Signed)
°  New Prob  Pt c/o BP issue: STAT if pt c/o blurred vision, one-sided weakness or slurred speech  1. What are your last 5 BP readings? 158/98  2. Are you having any other symptoms (ex. Dizziness, headache, blurred vision, passed out)? Mild headache x 2 days  3. What is your BP issue? Pt went to the eye doctor yesterday and was told her BP was high. Wanted to call us to speak to a RN

## 2017-04-07 NOTE — Telephone Encounter (Signed)
LM with MD recommendations & also sent a MyChart message with this info.

## 2017-04-11 ENCOUNTER — Ambulatory Visit (INDEPENDENT_AMBULATORY_CARE_PROVIDER_SITE_OTHER): Payer: 59

## 2017-04-11 ENCOUNTER — Other Ambulatory Visit: Payer: Self-pay

## 2017-04-11 ENCOUNTER — Ambulatory Visit (HOSPITAL_COMMUNITY): Payer: 59 | Attending: Internal Medicine

## 2017-04-11 DIAGNOSIS — I340 Nonrheumatic mitral (valve) insufficiency: Secondary | ICD-10-CM | POA: Insufficient documentation

## 2017-04-11 DIAGNOSIS — I517 Cardiomegaly: Secondary | ICD-10-CM | POA: Diagnosis not present

## 2017-04-11 DIAGNOSIS — R002 Palpitations: Secondary | ICD-10-CM | POA: Insufficient documentation

## 2017-04-11 DIAGNOSIS — I341 Nonrheumatic mitral (valve) prolapse: Secondary | ICD-10-CM | POA: Diagnosis not present

## 2017-04-12 ENCOUNTER — Encounter: Payer: Self-pay | Admitting: Cardiology

## 2017-04-17 ENCOUNTER — Telehealth: Payer: Self-pay | Admitting: *Deleted

## 2017-04-17 MED ORDER — METOPROLOL TARTRATE 25 MG PO TABS: 25.0000 mg | ORAL_TABLET | Freq: Two times a day (BID) | ORAL | 3 refills | Status: DC

## 2017-04-17 NOTE — Telephone Encounter (Signed)
-----   Message from [name], MD sent at 04/17/2017  7:57 AM EDT ----- She had sinus tachycardia but no significant arrhythmias.  She could take metoprolol tartrate 25 mg BID.  Disp number 60 with 11 refills.

## 2017-04-17 NOTE — Telephone Encounter (Signed)
Pt aware of her holter monitor, Metoprolol 25 mg twice a day send into pt pharmacy.Marland Kitchen

## 2017-07-12 ENCOUNTER — Ambulatory Visit: Payer: Self-pay | Admitting: *Deleted

## 2017-07-12 NOTE — Telephone Encounter (Signed)
Pt called stating that her blood pressure is 140/96 at 1311; pt states that she had a chemical peel done on her face on Monday 07/10/17 but "her left eye is looking weak"; she says it looked ok yesterday; pt also states that she has other elevated blood pressure readings; pt triaged per nurse protocol; pt directed to go to ED; she verbalizes understanding and states that her husband will take her; will route to [facility] for notification of this encounter.   Reason for Disposition . [2] Systolic BP  >= 951 OR Diastolic >= 884 AND [1] cardiac or neurologic symptoms (e.g., chest pain, difficulty breathing, unsteady gait, blurred vision)  Answer Assessment - Initial Assessment Questions 1. BLOOD PRESSURE: "What is the blood pressure?" "Did you take at least two measurements 5 minutes apart?"     1311 BP 140/96 and 139/93 at 1316 2. ONSET: "When did you take your blood pressure?"     today 3. HOW: "How did you obtain the blood pressure?" (e.g., visiting nurse, automatic home BP monitor)     Automatic cuff on left arm 4. HISTORY: "Do you have a history of high blood pressure?"     no 5. MEDICATIONS: "Are you taking any medications for blood pressure?" "Have you missed any doses recently?"     no 6. OTHER SYMPTOMS: "Do you have any symptoms?" (e.g., headache, chest pain, blurred vision, difficulty breathing, weakness)     Dizziness and blurred vision in left eye; only lasted a few seconds 7. PREGNANCY: "Is there any chance you are pregnant?" "When was your last menstrual period?"     No partial hysterectomy 2013  Protocols used: HIGH BLOOD PRESSURE-A-AH
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
## 2017-11-30 ENCOUNTER — Ambulatory Visit: Payer: 59 | Admitting: Internal Medicine

## 2018-01-10 ENCOUNTER — Ambulatory Visit: Payer: 59 | Admitting: Internal Medicine

## 2018-01-15 ENCOUNTER — Encounter: Payer: Self-pay | Admitting: Gastroenterology

## 2018-01-26 ENCOUNTER — Ambulatory Visit: Payer: 59 | Admitting: Cardiology

## 2018-03-13 ENCOUNTER — Encounter: Payer: Self-pay | Admitting: Gastroenterology

## 2018-03-13 ENCOUNTER — Ambulatory Visit (AMBULATORY_SURGERY_CENTER): Payer: Self-pay | Admitting: *Deleted

## 2018-03-13 VITALS — Ht 63.0 in | Wt 181.0 lb

## 2018-03-13 DIAGNOSIS — Z86010 Personal history of colonic polyps: Secondary | ICD-10-CM

## 2018-03-13 DIAGNOSIS — Z800 Family history of malignant neoplasm of digestive organs: Secondary | ICD-10-CM

## 2018-03-13 DIAGNOSIS — Z1509 Genetic susceptibility to other malignant neoplasm: Secondary | ICD-10-CM

## 2018-03-13 MED ORDER — NA SULFATE-K SULFATE-MG SULF 17.5-3.13-1.6 GM/177ML PO SOLN: 1.0000 | Freq: Once | ORAL | 0 refills | Status: AC

## 2018-03-13 NOTE — Progress Notes (Signed)
No egg or soy allergy known to patient  No issues with past sedation with any surgeries  or procedures, no intubation problems  No diet pills per patient No home 02 use per patient  No blood thinners per patient  Pt denies issues with constipation  No A fib or A flutter  EMMI video sent to pt's e mail - pt declined  Suprep $15 coupon to pt in PV today

## 2018-03-27 ENCOUNTER — Encounter: Payer: Self-pay | Admitting: Gastroenterology

## 2018-03-27 ENCOUNTER — Ambulatory Visit (AMBULATORY_SURGERY_CENTER): Payer: 59 | Admitting: Gastroenterology

## 2018-03-27 VITALS — BP 135/79 | HR 85 | Temp 97.9°F | Resp 13 | Ht 63.0 in | Wt 181.0 lb

## 2018-03-27 DIAGNOSIS — Z86010 Personal history of colonic polyps: Secondary | ICD-10-CM

## 2018-03-27 DIAGNOSIS — Z800 Family history of malignant neoplasm of digestive organs: Secondary | ICD-10-CM | POA: Diagnosis not present

## 2018-03-27 LAB — HM COLONOSCOPY

## 2018-03-27 MED ORDER — SODIUM CHLORIDE 0.9 % IV SOLN: 500.0000 mL | Freq: Once | INTRAVENOUS | Status: DC

## 2018-03-27 NOTE — Progress Notes (Signed)
Pt's states no medical or surgical changes since previsit or office visit. 

## 2018-03-27 NOTE — Op Note (Signed)
[place] Patient Name: [name] Procedure Date: 03/27/2018 2:02 PM MRN: [id number] Endoscopist: Ladene Artist , MD Age: 42 Referring MD:  Date of Birth: 1975-10-18 Gender: Female Account #: [id number] Procedure:                Colonoscopy Indications:              Colon cancer screening in patient at increased                            risk: Family history of hereditary nonpolyposis                            colorectal cancer in 1st-degree relative. Personal                            history of adenomatous colon polyps. Medicines:                Monitored Anesthesia Care Procedure:                Pre-Anesthesia Assessment:                           - Prior to the procedure, a History and Physical                            was performed, and patient medications and                            allergies were reviewed. The patient's tolerance of                            previous anesthesia was also reviewed. The risks                            and benefits of the procedure and the sedation                            options and risks were discussed with the patient.                            All questions were answered, and informed consent                            was obtained. Prior Anticoagulants: The patient has                            taken no previous anticoagulant or antiplatelet                            agents. ASA Grade Assessment: II - A patient with                            mild systemic disease. After reviewing the risks  and benefits, the patient was deemed in                            satisfactory condition to undergo the procedure.                           After obtaining informed consent, the colonoscope                            was passed under direct vision. Throughout the                            procedure, the patient's blood pressure, pulse, and                            oxygen saturations  were monitored continuously. The                            Colonoscope was introduced through the anus and                            advanced to the the cecum, identified by                            appendiceal orifice and ileocecal valve. The                            ileocecal valve, appendiceal orifice, and rectum                            were photographed. The quality of the bowel                            preparation was excellent. The colonoscopy was                            performed without difficulty. The patient tolerated                            the procedure well. Scope In: 2:08:11 PM Scope Out: 2:21:30 PM Scope Withdrawal Time: 0 hours 8 minutes 54 seconds  Total Procedure Duration: 0 hours 13 minutes 19 seconds  Findings:                 The perianal and digital rectal examinations were                            normal.                           Scattered small-mouthed diverticula were found in                            the sigmoid colon, descending colon and transverse  colon.                           Internal hemorrhoids were found during                            retroflexion. The hemorrhoids were small and Grade                            I (internal hemorrhoids that do not prolapse).                           The exam was otherwise without abnormality on                            direct and retroflexion views. Complications:            No immediate complications. Estimated blood loss:                            None. Estimated Blood Loss:     Estimated blood loss: none. Impression:               - Diverticulosis in the sigmoid colon, in the                            descending colon and in the transverse colon.                           - Internal hemorrhoids.                           - The examination was otherwise normal on direct                            and retroflexion views.                           - No specimens  collected. Recommendation:           - Repeat colonoscopy in 1 year for surveillance.                           - Patient has a contact number available for                            emergencies. The signs and symptoms of potential                            delayed complications were discussed with the                            patient. Return to normal activities tomorrow.                            Written discharge instructions were provided to the  patient.                           - Resume previous diet.                           - Continue present medications. Ladene Artist, MD 03/27/2018 2:35:46 PM This report has been signed electronically.

## 2018-03-27 NOTE — Progress Notes (Signed)
To Pacu, VSS. Report to [email or web address] 

## 2018-03-27 NOTE — Patient Instructions (Addendum)
  Handouts given : Diverticulosis and hemorrhoids.   YOU HAD AN ENDOSCOPIC PROCEDURE TODAY AT [facility]:   Refer to the procedure report that was given to you for any specific questions about what was found during the examination.  If the procedure report does not answer your questions, please call your gastroenterologist to clarify.  If you requested that your care partner not be given the details of your procedure findings, then the procedure report has been included in a sealed envelope for you to review at your convenience later.  YOU SHOULD EXPECT: Some feelings of bloating in the abdomen. Passage of more gas than usual.  Walking can help get rid of the air that was put into your GI tract during the procedure and reduce the bloating. If you had a lower endoscopy (such as a colonoscopy or flexible sigmoidoscopy) you may notice spotting of blood in your stool or on the toilet paper. If you underwent a bowel prep for your procedure, you may not have a normal bowel movement for a few days.  Please Note:  You might notice some irritation and congestion in your nose or some drainage.  This is from the oxygen used during your procedure.  There is no need for concern and it should clear up in a day or so.  SYMPTOMS TO REPORT IMMEDIATELY:   Following lower endoscopy (colonoscopy or flexible sigmoidoscopy):  Excessive amounts of blood in the stool  Significant tenderness or worsening of abdominal pains  Swelling of the abdomen that is new, acute  Fever of 100F or higher   For urgent or emergent issues, a gastroenterologist can be reached at any hour by calling [phone].   DIET:  We do recommend a small meal at first, but then you may proceed to your regular diet.  Drink plenty of fluids but you should avoid alcoholic beverages for 24 hours.  ACTIVITY:  You should plan to take it easy for the rest of today and you should NOT DRIVE or use heavy machinery until tomorrow  (because of the sedation medicines used during the test).    FOLLOW UP: Our staff will call the number listed on your records the next business day following your procedure to check on you and address any questions or concerns that you may have regarding the information given to you following your procedure. If we do not reach you, we will leave a message.  However, if you are feeling well and you are not experiencing any problems, there is no need to return our call.  We will assume that you have returned to your regular daily activities without incident.  If any biopsies were taken you will be contacted by phone or by letter within the next 1-3 weeks.  Please call us at [phone] if you have not heard about the biopsies in 3 weeks.    SIGNATURES/CONFIDENTIALITY: You and/or your care partner have signed paperwork which will be entered into your electronic medical record.  These signatures attest to the fact that that the information above on your After Visit Summary has been reviewed and is understood.  Full responsibility of the confidentiality of this discharge information lies with you and/or your care-partner.

## 2018-03-28 ENCOUNTER — Telehealth: Payer: Self-pay

## 2018-03-28 NOTE — Telephone Encounter (Signed)
  Follow up Call-  Call [name] number 03/27/2018 01/18/2017 09/07/2015  Post procedure Call [name] phone  # [phone]  Permission to leave phone message Yes Yes Yes  Some recent data might be hidden     Patient questions:  Do you have a fever, pain , or abdominal swelling? No. Pain Score  0 *  Have you tolerated food without any problems? Yes.    Have you been able to return to your normal activities? Yes.    Do you have any questions about your discharge instructions: Diet   No. Medications  No. Follow up visit  No.  Do you have questions or concerns about your Care? No.  Actions: * If pain score is 4 or above: No action needed, pain <4.

## 2018-03-28 NOTE — Telephone Encounter (Signed)
Left message

## 2018-04-12 ENCOUNTER — Other Ambulatory Visit (INDEPENDENT_AMBULATORY_CARE_PROVIDER_SITE_OTHER): Payer: 59

## 2018-04-12 ENCOUNTER — Telehealth: Payer: Self-pay | Admitting: Gastroenterology

## 2018-04-12 ENCOUNTER — Ambulatory Visit: Payer: 59 | Admitting: Nurse Practitioner

## 2018-04-12 ENCOUNTER — Encounter: Payer: Self-pay | Admitting: Nurse Practitioner

## 2018-04-12 VITALS — BP 126/84 | HR 95 | Ht 63.5 in | Wt 180.5 lb

## 2018-04-12 DIAGNOSIS — R1011 Right upper quadrant pain: Secondary | ICD-10-CM

## 2018-04-12 LAB — HEPATIC FUNCTION PANEL
ALK PHOS: 65 U/L (ref 39–117)
ALT: 12 U/L (ref 0–35)
AST: 12 U/L (ref 0–37)
Albumin: 4.2 g/dL (ref 3.5–5.2)
BILIRUBIN TOTAL: 0.5 mg/dL (ref 0.2–1.2)
Bilirubin, Direct: 0.1 mg/dL (ref 0.0–0.3)
Total Protein: 7.3 g/dL (ref 6.0–8.3)

## 2018-04-12 LAB — CBC WITH DIFFERENTIAL/PLATELET
BASOS ABS: 0 10*3/uL (ref 0.0–0.1)
Basophils Relative: 0.8 % (ref 0.0–3.0)
EOS ABS: 0.1 10*3/uL (ref 0.0–0.7)
EOS PCT: 1.1 % (ref 0.0–5.0)
HEMATOCRIT: 41.5 % (ref 36.0–46.0)
HEMOGLOBIN: 13.4 g/dL (ref 12.0–15.0)
Lymphocytes Relative: 19.1 % (ref 12.0–46.0)
Lymphs Abs: 1.2 10*3/uL (ref 0.7–4.0)
MCHC: 32.3 g/dL (ref 30.0–36.0)
MCV: 89.3 fl (ref 78.0–100.0)
Monocytes Absolute: 0.4 10*3/uL (ref 0.1–1.0)
Monocytes Relative: 6 % (ref 3.0–12.0)
Neutro Abs: 4.7 10*3/uL (ref 1.4–7.7)
Neutrophils Relative %: 73 % (ref 43.0–77.0)
Platelets: 276 10*3/uL (ref 150.0–400.0)
RBC: 4.64 Mil/uL (ref 3.87–5.11)
RDW: 14.7 % (ref 11.5–15.5)
WBC: 6.4 10*3/uL (ref 4.0–10.5)
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
## 2018-04-12 NOTE — Patient Instructions (Signed)
If you are age 43 or older, your body mass index should be between 23-30. Your Body mass index is 31.47 kg/m. If this is out of the aforementioned range listed, please consider follow up with your Primary Care Provider.  If you are age 64 or younger, your body mass index should be between 19-25. Your Body mass index is 31.47 kg/m. If this is out of the aformentioned range listed, please consider follow up with your Primary Care Provider.   Your provider has requested that you go to the basement level for lab work before leaving today. Press "B" on the elevator. The lab is located at the first door on the left as you exit the elevator.  You have been scheduled for an abdominal ultrasound at [facility] (1st floor of hospital) on 04/18/18 at 9 am. Please arrive 15 minutes prior to your appointment for registration. Make certain not to have anything to eat or drink 6 hours prior to your appointment. Should you need to reschedule your appointment, please contact radiology at [phone]. This test typically takes about 30 minutes to perform.  We will call you with results.  Thank you for choosing me and [facility].   Tye Savoy, NP

## 2018-04-12 NOTE — Telephone Encounter (Signed)
Patient scheduled first available appt (05/31/2018)to be seen for pain under her right breast ribs, wants to know what to do until then.

## 2018-04-12 NOTE — Telephone Encounter (Signed)
Patient will come in and see Tye Savoy RNP today at 2:00

## 2018-04-13 NOTE — Progress Notes (Signed)
Reviewed and agree with initial management plan.  Tuvia Woodrick T. Mekisha Bittel, MD FACG 

## 2018-04-18 ENCOUNTER — Ambulatory Visit (HOSPITAL_COMMUNITY)
Admission: RE | Admit: 2018-04-18 | Discharge: 2018-04-18 | Disposition: A | Discharge status: Home / Self Care | Payer: 59 | Source: Ambulatory Visit | Attending: Nurse Practitioner | Admitting: Nurse Practitioner

## 2018-04-18 DIAGNOSIS — R1011 Right upper quadrant pain: Secondary | ICD-10-CM | POA: Diagnosis not present

## 2018-05-11 ENCOUNTER — Ambulatory Visit: Payer: 59 | Admitting: Gastroenterology

## 2018-07-05 ENCOUNTER — Telehealth: Payer: Self-pay | Admitting: Internal Medicine

## 2018-07-05 NOTE — Telephone Encounter (Signed)
I have worked patient in sooner with Dr. Ronnald Ramp for 1/7 for high BP.  Patient states her BP has been running high.  States before Christmas BP was running, 140/100, 137/93, 124/82, 140/95, 131/92, 141/84 and this past Tuesday (07/03/18) 136/90.  Patient states on that date she was experiencing headaches.  Patient declines any symptoms right now.  I have informed patient to give [place] a call to be triaged if she starts to experience any symptoms again.  Please advise if patient needs to be seen sooner.

## 2018-07-05 NOTE — Telephone Encounter (Signed)
There are openings on Friday, can some one see her then or can pt come in on Friday.

## 2018-07-10 ENCOUNTER — Encounter: Payer: Self-pay | Admitting: Internal Medicine

## 2018-07-10 ENCOUNTER — Ambulatory Visit: Payer: 59 | Admitting: Internal Medicine

## 2018-07-10 ENCOUNTER — Other Ambulatory Visit (INDEPENDENT_AMBULATORY_CARE_PROVIDER_SITE_OTHER): Payer: 59

## 2018-07-10 ENCOUNTER — Ambulatory Visit: Payer: 59 | Admitting: Gastroenterology

## 2018-07-10 VITALS — BP 128/82 | HR 90 | Temp 98.7°F | Resp 16 | Ht 63.5 in | Wt 181.0 lb

## 2018-07-10 DIAGNOSIS — I10 Essential (primary) hypertension: Secondary | ICD-10-CM

## 2018-07-10 DIAGNOSIS — K219 Gastro-esophageal reflux disease without esophagitis: Secondary | ICD-10-CM

## 2018-07-10 DIAGNOSIS — E038 Other specified hypothyroidism: Secondary | ICD-10-CM

## 2018-07-10 DIAGNOSIS — Z23 Encounter for immunization: Secondary | ICD-10-CM | POA: Diagnosis not present

## 2018-07-10 DIAGNOSIS — Z0000 Encounter for general adult medical examination without abnormal findings: Secondary | ICD-10-CM

## 2018-07-10 LAB — CBC WITH DIFFERENTIAL/PLATELET
Basophils Absolute: 0 10*3/uL (ref 0.0–0.1)
Basophils Relative: 0.4 % (ref 0.0–3.0)
Eosinophils Absolute: 0.1 10*3/uL (ref 0.0–0.7)
Eosinophils Relative: 0.8 % (ref 0.0–5.0)
HCT: 40.6 % (ref 36.0–46.0)
Hemoglobin: 13.6 g/dL (ref 12.0–15.0)
LYMPHS ABS: 0.9 10*3/uL (ref 0.7–4.0)
Lymphocytes Relative: 10.1 % — ABNORMAL LOW (ref 12.0–46.0)
MCHC: 33.5 g/dL (ref 30.0–36.0)
MCV: 89.7 fl (ref 78.0–100.0)
MONOS PCT: 5.4 % (ref 3.0–12.0)
Monocytes Absolute: 0.5 10*3/uL (ref 0.1–1.0)
Neutro Abs: 7.6 10*3/uL (ref 1.4–7.7)
Neutrophils Relative %: 83.3 % — ABNORMAL HIGH (ref 43.0–77.0)
Platelets: 265 10*3/uL (ref 150.0–400.0)
RBC: 4.52 Mil/uL (ref 3.87–5.11)
RDW: 14 % (ref 11.5–15.5)
WBC: 9.1 10*3/uL (ref 4.0–10.5)

## 2018-07-10 LAB — LIPID PANEL
Cholesterol: 179 mg/dL (ref 0–200)
HDL: 54.7 mg/dL (ref >39.00)
LDL Cholesterol: 96 mg/dL (ref 0–99)
NonHDL: 124.04
Total CHOL/HDL Ratio: 3
Triglycerides: 142 mg/dL (ref 0.0–149.0)
VLDL: 28.4 mg/dL (ref 0.0–40.0)

## 2018-07-10 LAB — COMPREHENSIVE METABOLIC PANEL
ALT: 12 U/L (ref 0–35)
AST: 12 U/L (ref 0–37)
Albumin: 4.2 g/dL (ref 3.5–5.2)
Alkaline Phosphatase: 80 U/L (ref 39–117)
BUN: 9 mg/dL (ref 6–23)
CO2: 28 mEq/L (ref 19–32)
Calcium: 9.6 mg/dL (ref 8.4–10.5)
Chloride: 103 mEq/L (ref 96–112)
Creatinine, Ser: 1.11 mg/dL (ref 0.40–1.20)
GFR: 68.68 mL/min (ref >60.00)
GLUCOSE: 106 mg/dL — AB (ref 70–99)
Potassium: 4.1 mEq/L (ref 3.5–5.1)
Sodium: 137 mEq/L (ref 135–145)
Total Bilirubin: 0.5 mg/dL (ref 0.2–1.2)
Total Protein: 7.1 g/dL (ref 6.0–8.3)

## 2018-07-10 LAB — TSH: TSH: 2.29 u[IU]/mL (ref 0.35–4.50)

## 2018-07-10 MED ORDER — NEBIVOLOL HCL 10 MG PO TABS: 10.0000 mg | ORAL_TABLET | Freq: Every day | ORAL | 0 refills | Status: DC

## 2018-07-10 NOTE — Patient Instructions (Signed)

## 2018-07-10 NOTE — Progress Notes (Signed)
Subjective:  Patient ID: [name], female    DOB: 04/16/1976  Age: 43 y.o. MRN: [id number]  CC: Annual Exam and Hypertension   HPI JAMEAH ROUSER presents for a CPX and BP check - She has recently been checking her blood sugar at home on an automatic machine.  She was consistently getting blood pressures over 140/90 so she decided to start taking her husband's supply of Bystolic 10 mg once a day.  Since taking this with the ARB she has had a few episodes of lightheadedness over the last 3 days.  She otherwise feels well and offers no other complaints.  Outpatient Medications Prior to Visit  Medication Sig Dispense Refill  . clindamycin (CLEOCIN T) 1 % external solution APPLY ON THE SKIN D  1  . metroNIDAZOLE (METROGEL) 0.75 % vaginal gel Place 1 Applicatorful vaginally 2 (two) times daily.    . Multiple Vitamins-Minerals (WOMENS HAIR, SKIN & NAILS PO) Take by mouth. Natures Bounty Women's Gummy Vitamin-Take one daily    . tazarotene (AVAGE) 0.1 % cream   2  . valACYclovir (VALTREX) 500 MG tablet Take 500 mg by mouth as needed. Reported on 09/18/2015    . losartan (COZAAR) 100 MG tablet   10  . PRESCRIPTION MEDICATION Hemorrhoid cream prescribed by MS    . furosemide (LASIX) 20 MG tablet Take 1 tablet (20 mg total) by mouth daily as needed. 30 tablet 6   No facility-administered medications prior to visit.     ROS Review of Systems  Constitutional: Negative for chills, diaphoresis, fatigue and fever.  HENT: Negative.  Negative for sinus pressure and sore throat.   Respiratory: Negative for apnea, cough, choking, shortness of breath and wheezing.   Cardiovascular: Negative for chest pain, palpitations and leg swelling.  Gastrointestinal: Negative for abdominal pain, constipation, diarrhea, nausea and vomiting.  Endocrine: Negative.   Genitourinary: Negative for difficulty urinating and dysuria.  Musculoskeletal: Negative.  Negative for arthralgias and myalgias.    Skin: Negative for color change, pallor and rash.  Neurological: Positive for light-headedness. Negative for dizziness, weakness, numbness and headaches.  Hematological: Negative for adenopathy. Does not bruise/bleed easily.  Psychiatric/Behavioral: Negative.     Objective:  BP 128/82   Pulse 90   Temp 98.7 F (37.1 C) (Oral)   Resp 16   Ht 5' 3.5" (1.613 m)   Wt 181 lb (82.1 kg)   LMP 01/12/2012   SpO2 98%   BMI 31.56 kg/m   BP Readings from Last 3 Encounters:  07/10/18 128/82  04/12/18 126/84  03/27/18 135/79    Wt Readings from Last 3 Encounters:  07/10/18 181 lb (82.1 kg)  04/12/18 180 lb 8 oz (81.9 kg)  03/27/18 181 lb (82.1 kg)    Physical Exam Vitals signs reviewed.  Constitutional:      General: She is not in acute distress.    Appearance: She is obese. She is not ill-appearing, toxic-appearing or diaphoretic.  HENT:     Nose: Nose normal. No congestion.     Mouth/Throat:     Pharynx: Oropharynx is clear. No oropharyngeal exudate or posterior oropharyngeal erythema.  Eyes:     General: No scleral icterus.    Conjunctiva/sclera: Conjunctivae normal.  Neck:     Musculoskeletal: Normal range of motion and neck supple. No neck rigidity or muscular tenderness.  Cardiovascular:     Rate and Rhythm: Normal rate and regular rhythm.     Heart sounds: No murmur. No friction rub. No  gallop.   Pulmonary:     Effort: Pulmonary effort is normal.     Breath sounds: No stridor. No wheezing, rhonchi or rales.  Abdominal:     General: Bowel sounds are normal.     Palpations: There is no hepatomegaly, splenomegaly or mass.     Tenderness: There is no abdominal tenderness. There is no guarding.     Hernia: No hernia is present.  Musculoskeletal: Normal range of motion.        General: No swelling.     Left lower leg: No edema.  Lymphadenopathy:     Cervical: No cervical adenopathy.  Skin:    General: Skin is warm and dry.     Coloration: Skin is not pale.      Findings: No erythema or rash.  Neurological:     General: No focal deficit present.     Mental Status: She is alert and oriented to person, place, and time. Mental status is at baseline.  Psychiatric:        Mood and Affect: Mood normal.        Behavior: Behavior normal.        Thought Content: Thought content normal.        Judgment: Judgment normal.     Lab Results  Component Value Date   WBC 9.1 07/10/2018   HGB 13.6 07/10/2018   HCT 40.6 07/10/2018   PLT 265.0 07/10/2018   GLUCOSE 106 (H) 07/10/2018   CHOL 179 07/10/2018   TRIG 142.0 07/10/2018   HDL 54.70 07/10/2018   LDLCALC 96 07/10/2018   ALT 12 07/10/2018   AST 12 07/10/2018   NA 137 07/10/2018   K 4.1 07/10/2018   CL 103 07/10/2018   CREATININE 1.11 07/10/2018   BUN 9 07/10/2018   CO2 28 07/10/2018   TSH 2.29 07/10/2018   HGBA1C 5.5 09/11/2014    US Abdomen Limited Ruq  Result Date: 04/18/2018 CLINICAL DATA:  Right upper quadrant pain EXAM: ULTRASOUND ABDOMEN LIMITED RIGHT UPPER QUADRANT COMPARISON:  None. FINDINGS: Gallbladder: No gallstones or wall thickening visualized. No sonographic Murphy sign noted by sonographer. Common bile duct: Diameter: Normal caliber, 5 mm Liver: 2.1 cm cyst in the right hepatic lobe which appears benign/simple. Normal echotexture. No ductal dilatation. Portal vein is patent on color Doppler imaging with normal direction of blood flow towards the liver. IMPRESSION: No acute findings. Electronically Signed   By: Rolm Baptise M.D.   On: 04/18/2018 15:11    Assessment & Plan:   Sherrie was seen today for annual exam and hypertension.  Diagnoses and all orders for this visit:  Other specified hypothyroidism- Her TSH is in the normal range.  Thyroid replacement therapy is not indicated. -     TSH; Future  Gastroesophageal reflux disease without esophagitis- She has no symptoms related to this. -     CBC with Differential/Platelet; Future  Essential hypertension- Her blood  pressure is well controlled and she complains of lightheadedness so I have asked her to stop taking the ARB.  Will control her blood pressure with nebivolol. -     Comprehensive metabolic panel; Future -     nebivolol (BYSTOLIC) 10 MG tablet; Take 1 tablet (10 mg total) by mouth daily.  Routine general medical examination at a health care facility- Exam completed, labs reviewed, vaccines reviewed and updated, her Pap and mammogram are up-to-date, patient education material was given. -     Lipid panel; Future  Need for Tdap vaccination -  Tdap vaccine greater than or equal to 7yo IM   I have discontinued Pura Spice D. Rozas's PRESCRIPTION MEDICATION, furosemide, and losartan. I am also having her start on nebivolol. Additionally, I am having her maintain her valACYclovir, (Multiple Vitamins-Minerals (WOMENS HAIR, SKIN & NAILS PO)), clindamycin, tazarotene, and metroNIDAZOLE.  Meds ordered this encounter  Medications  . nebivolol (BYSTOLIC) 10 MG tablet    Sig: Take 1 tablet (10 mg total) by mouth daily.    Dispense:  90 tablet    Refill:  0     Follow-up: Return in about 3 months (around 10/09/2018).  [name], MD

## 2018-07-23 ENCOUNTER — Ambulatory Visit: Payer: 59 | Admitting: Gastroenterology

## 2018-07-23 ENCOUNTER — Telehealth: Payer: Self-pay

## 2018-07-23 NOTE — Telephone Encounter (Signed)
Do you want to charge? 

## 2018-07-23 NOTE — Telephone Encounter (Signed)
-----   Message from Erven Colla sent at 07/23/2018  8:32 AM EST ----- Regarding: Pt canceled appt for today 1.20.2020 @9 :45am Patient states she had an emergency meeting at work and could not make it. Pt rescheduled to 2.4.2020.

## 2018-07-23 NOTE — Telephone Encounter (Signed)
No charge this time. 

## 2018-07-24 ENCOUNTER — Ambulatory Visit: Payer: 59 | Admitting: Internal Medicine

## 2018-08-02 ENCOUNTER — Ambulatory Visit: Payer: 59 | Admitting: Podiatry

## 2018-08-02 ENCOUNTER — Other Ambulatory Visit: Payer: Self-pay

## 2018-08-02 ENCOUNTER — Other Ambulatory Visit: Payer: Self-pay | Admitting: Podiatry

## 2018-08-02 ENCOUNTER — Encounter: Payer: Self-pay | Admitting: Podiatry

## 2018-08-02 ENCOUNTER — Ambulatory Visit (INDEPENDENT_AMBULATORY_CARE_PROVIDER_SITE_OTHER): Payer: 59

## 2018-08-02 VITALS — BP 116/83

## 2018-08-02 DIAGNOSIS — M79671 Pain in right foot: Secondary | ICD-10-CM

## 2018-08-02 DIAGNOSIS — S93491A Sprain of other ligament of right ankle, initial encounter: Secondary | ICD-10-CM | POA: Diagnosis not present

## 2018-08-02 DIAGNOSIS — M25571 Pain in right ankle and joints of right foot: Secondary | ICD-10-CM

## 2018-08-02 DIAGNOSIS — M7750 Other enthesopathy of unspecified foot: Secondary | ICD-10-CM

## 2018-08-02 DIAGNOSIS — R269 Unspecified abnormalities of gait and mobility: Secondary | ICD-10-CM

## 2018-08-02 DIAGNOSIS — M7751 Other enthesopathy of right foot: Secondary | ICD-10-CM

## 2018-08-02 MED ORDER — MELOXICAM 15 MG PO TABS: 15.0000 mg | ORAL_TABLET | Freq: Every day | ORAL | 0 refills | Status: DC
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
## 2018-08-03 ENCOUNTER — Ambulatory Visit: Payer: 59 | Admitting: Cardiology

## 2018-08-03 ENCOUNTER — Encounter: Payer: Self-pay | Admitting: Cardiology

## 2018-08-03 VITALS — BP 132/90 | HR 84 | Ht 63.0 in | Wt 183.8 lb

## 2018-08-03 DIAGNOSIS — I340 Nonrheumatic mitral (valve) insufficiency: Secondary | ICD-10-CM | POA: Diagnosis not present

## 2018-08-03 DIAGNOSIS — R002 Palpitations: Secondary | ICD-10-CM | POA: Diagnosis not present

## 2018-08-03 DIAGNOSIS — I341 Nonrheumatic mitral (valve) prolapse: Secondary | ICD-10-CM

## 2018-08-03 MED ORDER — DILTIAZEM HCL 30 MG PO TABS: 30.0000 mg | ORAL_TABLET | Freq: Once | ORAL | 0 refills | Status: DC

## 2018-08-03 NOTE — Patient Instructions (Addendum)
Medication Instructions:  TAKE- Cardizem 30 mg the morning of procedure  If you need a refill on your cardiac medications before your next appointment, please call your pharmacy.  Labwork: BMP HERE IN OUR OFFICE AT [facility]  You will need to fast. DO NOT EAT OR DRINK PAST MIDNIGHT.     You will NOT need to fast   Take the provided lab slips with you to the lab for your blood draw.   When you have your labs (blood work) drawn today and your tests are completely normal, you will receive your results only by MyChart Message (if you have MyChart) -OR-  A paper copy in the mail.  If you have any lab test that is abnormal or we need to change your treatment, we will call you to review these results.  Testing/Procedures: Non-Cardiac CT Angiography (CTA), is a special type of CT scan that uses a computer to produce multi-dimensional views of major blood vessels throughout the body. In CT angiography, a contrast material is injected through an IV to help visualize the blood vessels  Special Instructions: Please arrive at the [facility] main entrance of [facility] at              AM (30-45 minutes prior to test start time)  [facility] [place], [place] [address] [phone]  Proceed to the [facility] Radiology Department (First Floor).  Please follow these instructions carefully (unless otherwise directed):  Hold all erectile dysfunction medications at least 48 hours prior to test.  On the Night Before the Test: . Be sure to Drink plenty of water. . Do not consume any caffeinated/decaffeinated beverages or chocolate 12 hours prior to your test. . Do not take any antihistamines 12 hours prior to your test.   On the Day of the Test: . Drink plenty of water. Do not drink any water within one hour of the test. . Do not eat any food 4 hours prior to the test. . You may take your regular medications prior to the test.  . Take metoprolol (Lopressor) two  hours prior to test.   After the Test: . Drink plenty of water. . After receiving IV contrast, you may experience a mild flushed feeling. This is normal. . On occasion, you may experience a mild rash up to 24 hours after the test. This is not dangerous. If this occurs, you can take Benadryl 25 mg and increase your fluid intake. . If you experience trouble breathing, this can be serious. If it is severe call 911 IMMEDIATELY. If it is mild, please call our office.   Follow-Up: . Your physician recommends that you schedule a follow-up appointment in: After CTA   At [facility], you and your health needs are our priority.  As part of our continuing mission to provide you with exceptional heart care, we have created designated Provider Care Teams.  These Care Teams include your primary Cardiologist (physician) and Advanced Practice Providers (APPs -  Physician Assistants and Nurse Practitioners) who all work together to provide you with the care you need, when you need it.  Thank you for choosing [facility] at [facility]!!

## 2018-08-06 ENCOUNTER — Telehealth: Payer: Self-pay | Admitting: Cardiology

## 2018-08-06 NOTE — Telephone Encounter (Signed)
Spoke with pt. Pt was seen by Dr.Hochrein and her Cor CTA ordered on 08/03/18. Adv pt that the process of getting the test prior auth and scheduled can take a couple of weeks. A scheduler will call her directly to schedule the test. If the test is denied by her insurance they will notify her and Dr.Hochrein. Pt verbalized understanding and voiced appreciation for the call back.

## 2018-08-06 NOTE — Telephone Encounter (Signed)
New Message   PT is calling to check status of pre certification of  CT [employer] doesn't have it  Please call

## 2018-08-07 ENCOUNTER — Other Ambulatory Visit: Payer: Self-pay | Admitting: *Deleted

## 2018-08-07 ENCOUNTER — Ambulatory Visit: Payer: 59 | Admitting: Gastroenterology

## 2018-08-07 ENCOUNTER — Telehealth: Payer: Self-pay | Admitting: Cardiology

## 2018-08-07 DIAGNOSIS — I341 Nonrheumatic mitral (valve) prolapse: Secondary | ICD-10-CM

## 2018-08-07 DIAGNOSIS — I340 Nonrheumatic mitral (valve) insufficiency: Secondary | ICD-10-CM

## 2018-08-07 NOTE — Telephone Encounter (Signed)
Spoke with pt, aware no problem having the vaccine.

## 2018-08-07 NOTE — Telephone Encounter (Signed)
No contraindication with Hep B vaccine and valve disease.  Most vaccines are recommended for patient with cardiac disease for risk reduction.

## 2018-08-07 NOTE — Telephone Encounter (Signed)
Spoke with pt, when she mentioned her mitral valve disease to her supervisor they are just wanting to make sure it is fine for her to have the shot. Advised do not think there is a problem but will make sure okay. Will forward to the pharm md to review and advise.

## 2018-08-07 NOTE — Progress Notes (Signed)
Referral placed in Epic to Dr Roxy Manns

## 2018-08-07 NOTE — Telephone Encounter (Signed)
  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 Her supervisor wanted her to check to make sure it is ok before they give it to her.

## 2018-08-16 ENCOUNTER — Telehealth: Payer: Self-pay | Admitting: Cardiology

## 2018-08-16 NOTE — Telephone Encounter (Signed)
New Message          Patient is calling checking the status of her CT that is suppose to be scheduled, pls call and advise.

## 2018-08-17 NOTE — Telephone Encounter (Signed)
Spoke with sharon ferguson she stated she will be calling the pt to today to schedule CTA appt

## 2018-08-27 ENCOUNTER — Encounter (HOSPITAL_COMMUNITY): Payer: Self-pay | Admitting: Emergency Medicine

## 2018-08-27 ENCOUNTER — Telehealth (HOSPITAL_COMMUNITY): Payer: Self-pay | Admitting: Emergency Medicine

## 2018-08-27 NOTE — Telephone Encounter (Signed)
Reaching out to patient to offer assistance regarding upcoming cardiac imaging study; pt verbalizes understanding of appt date/time, parking situation and where to check in, pre-test NPO status and medications ordered, and verified current allergies; name and call back number provided for further questions should they arise Marchia Bond RN Navigator Cardiac [facility] [phone] office [phone] cell  Pt given one time dose of diltiazem to take 2 hr prior to scan

## 2018-08-28 ENCOUNTER — Ambulatory Visit (HOSPITAL_COMMUNITY): Admission: RE | Admit: 2018-08-28 | Payer: 59 | Source: Ambulatory Visit

## 2018-08-28 ENCOUNTER — Ambulatory Visit (HOSPITAL_COMMUNITY)
Admission: RE | Admit: 2018-08-28 | Discharge: 2018-08-28 | Disposition: A | Discharge status: Home / Self Care | Payer: 59 | Source: Ambulatory Visit | Attending: Cardiology | Admitting: Cardiology

## 2018-08-28 DIAGNOSIS — I341 Nonrheumatic mitral (valve) prolapse: Secondary | ICD-10-CM

## 2018-08-28 MED ORDER — METOPROLOL TARTRATE 5 MG/5ML IV SOLN
INTRAVENOUS | Status: AC
  Filled 2018-08-28: qty 20

## 2018-08-28 MED ORDER — NITROGLYCERIN 0.4 MG SL SUBL
SUBLINGUAL_TABLET | SUBLINGUAL | Status: AC
  Filled 2018-08-28: qty 1

## 2018-08-28 MED ORDER — METOPROLOL TARTRATE 5 MG/5ML IV SOLN
5.0000 mg | INTRAVENOUS | Status: AC | PRN
  Administered 2018-08-28 (×4): 5 mg via INTRAVENOUS
  Filled 2018-08-28 (×3): qty 5

## 2018-08-28 MED ORDER — NITROGLYCERIN 0.4 MG SL SUBL
0.8000 mg | SUBLINGUAL_TABLET | Freq: Once | SUBLINGUAL | Status: AC
  Administered 2018-08-28: 0.8 mg via SUBLINGUAL
  Filled 2018-08-28: qty 25

## 2018-08-28 MED ORDER — IOPAMIDOL (ISOVUE-370) INJECTION 76%
80.0000 mL | Freq: Once | INTRAVENOUS | Status: AC | PRN
  Administered 2018-08-28: 80 mL via INTRAVENOUS

## 2018-08-28 NOTE — Progress Notes (Signed)
Pt ambulatory out of department with steady gait noted. Denies any complaints.  

## 2018-08-28 NOTE — Progress Notes (Signed)
Ct complete. Patient denies any complaints. Offered patient snack and beverage.

## 2018-08-30 ENCOUNTER — Encounter: Payer: Self-pay | Admitting: Thoracic Surgery (Cardiothoracic Vascular Surgery)

## 2018-08-30 ENCOUNTER — Other Ambulatory Visit: Payer: Self-pay

## 2018-08-30 ENCOUNTER — Other Ambulatory Visit: Payer: Self-pay | Admitting: *Deleted

## 2018-08-30 ENCOUNTER — Ambulatory Visit: Payer: 59 | Admitting: Podiatry

## 2018-08-30 ENCOUNTER — Encounter: Payer: Self-pay | Admitting: *Deleted

## 2018-08-30 ENCOUNTER — Institutional Professional Consult (permissible substitution): Payer: 59 | Admitting: Thoracic Surgery (Cardiothoracic Vascular Surgery)

## 2018-08-30 VITALS — BP 144/86 | HR 78 | Resp 18 | Ht 63.0 in | Wt 180.4 lb

## 2018-08-30 DIAGNOSIS — I712 Thoracic aortic aneurysm, without rupture, unspecified: Secondary | ICD-10-CM

## 2018-08-30 DIAGNOSIS — I340 Nonrheumatic mitral (valve) insufficiency: Secondary | ICD-10-CM

## 2018-08-30 DIAGNOSIS — I341 Nonrheumatic mitral (valve) prolapse: Secondary | ICD-10-CM | POA: Diagnosis not present

## 2018-08-30 DIAGNOSIS — I7409 Other arterial embolism and thrombosis of abdominal aorta: Secondary | ICD-10-CM

## 2018-08-30 DIAGNOSIS — Z01818 Encounter for other preprocedural examination: Secondary | ICD-10-CM

## 2018-08-30 NOTE — Patient Instructions (Signed)
Continue all previous medications without any changes at this time  
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
## 2018-08-31 ENCOUNTER — Encounter: Payer: Self-pay | Admitting: Thoracic Surgery (Cardiothoracic Vascular Surgery)

## 2018-09-20 ENCOUNTER — Telehealth: Payer: Self-pay

## 2018-09-20 NOTE — Telephone Encounter (Signed)
Patient contacted the office requesting advice to if she should have her teeth cleaned before surgery.  Patient advised, yes she can have her teeth cleaned.  She acknowledged receipt.

## 2018-09-24 ENCOUNTER — Telehealth: Payer: Self-pay | Admitting: Thoracic Surgery (Cardiothoracic Vascular Surgery)

## 2018-09-24 ENCOUNTER — Encounter: Payer: Self-pay | Admitting: Thoracic Surgery (Cardiothoracic Vascular Surgery)

## 2018-09-24 NOTE — Telephone Encounter (Signed)
[address]       [place] [address]             [phone]     CARDIOTHORACIC SURGERY TELEPHONE VIRTUAL OFFICE NOTE  Referring Provider is [name], MD Primary Cardiologist is No primary care provider on file. PCP is [name], MD   HPI:  Patient is 43 year old African-American female with mitral valve prolapse and stage D severe symptomatic primary mitral regurgitation.  She was seen in consultation on August 30, 2018 and at that time we made tentative plans for elective mitral valve repair next week.  I contacted the patient via telephone today because of the ongoing COVID-19 pandemic and it's impact on elective surgical procedures.  The patient reports no new problems or complaints over the last several weeks.  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  She has not developed worsening shortness of breath.  She does not have any cough or fever.  She has not been traveling.  To her knowledge she has not been exposed to anybody with known COVID-19 infection.   Current Outpatient Medications  Medication Sig Dispense Refill  . cetirizine-pseudoephedrine (ZYRTEC-D) 5-120 MG tablet Take 1 tablet by mouth daily as needed for allergies.    . clindamycin (CLEOCIN T) 1 % external solution Apply 1 application topically 2 (two) times daily.   1  . doxylamine, Sleep, (UNISOM) 25 MG tablet Take 25 mg by mouth at bedtime as needed for sleep.    Marland Kitchen ibuprofen (ADVIL,MOTRIN) 200 MG tablet Take 800 mg by mouth daily as needed for headache or moderate pain.    . nebivolol (BYSTOLIC) 10 MG tablet Take 1 tablet (10 mg total) by mouth daily. 90 tablet 0  . Oxymetazoline HCl (VICKS SINEX 12 HOUR NA) Place 1 spray into the nose daily as needed (allergies).    . tazarotene (AVAGE) 0.1 % cream Apply 1 application topically at bedtime.   2  . valACYclovir (VALTREX) 500 MG tablet Take 500 mg by mouth  daily as needed (fever blisters).      No current facility-administered medications for this visit.       Physical Exam:  N/A  Diagnostic Tests:  N/A   Impression:  Patient has long history of mitral valve prolapse which has slowly progressed over time.  Recent echocardiograms suggest some degree of disease progression to the point where the patient has moderate to severe and probably severe mitral regurgitation with normal left ventricular systolic function.  She has developed mild symptoms of exertional shortness of breath that occur only with more strenuous activity.  Symptoms have not progressed substantially over the past month.   Plan:  Under the ongoing circumstances regarding the COVID-19 I think it makes sense to postpone the patient's elective mitral valve repair for at least a few weeks.  The patient has been instructed to call and contact us if she develops worsening signs or symptoms of congestive heart failure.  She has been strongly advised to follow all guidelines provided by the CDC regarding taking all measures possible to minimize the risk that she might contract the COVID-19 virus.  All questions answered.  We will plan to reschedule her follow-up office visit and surgery in approximately 4 weeks, if not sooner should circumstances permit.   I spent in excess of 15 minutes during the conduct of this office consultation and >50% of this  time involved coordination of their care.   Valentina Gu. Roxy Manns, MD 09/24/2018 11:16 AM

## 2018-09-26 ENCOUNTER — Other Ambulatory Visit: Payer: 59

## 2018-10-01 ENCOUNTER — Encounter (HOSPITAL_COMMUNITY): Payer: 59

## 2018-10-01 ENCOUNTER — Ambulatory Visit (HOSPITAL_COMMUNITY): Payer: 59

## 2018-10-01 ENCOUNTER — Ambulatory Visit: Payer: 59 | Admitting: Thoracic Surgery (Cardiothoracic Vascular Surgery)

## 2018-10-01 ENCOUNTER — Inpatient Hospital Stay (HOSPITAL_COMMUNITY): Admission: RE | Admit: 2018-10-01 | Payer: 59 | Source: Ambulatory Visit

## 2018-10-23 ENCOUNTER — Encounter: Payer: Self-pay | Admitting: Thoracic Surgery (Cardiothoracic Vascular Surgery)

## 2018-10-23 ENCOUNTER — Telehealth (INDEPENDENT_AMBULATORY_CARE_PROVIDER_SITE_OTHER): Payer: 59 | Admitting: Thoracic Surgery (Cardiothoracic Vascular Surgery)

## 2018-10-23 ENCOUNTER — Other Ambulatory Visit: Payer: Self-pay | Admitting: *Deleted

## 2018-10-23 DIAGNOSIS — I340 Nonrheumatic mitral (valve) insufficiency: Secondary | ICD-10-CM | POA: Diagnosis not present

## 2018-10-23 DIAGNOSIS — I341 Nonrheumatic mitral (valve) prolapse: Secondary | ICD-10-CM

## 2018-10-23 MED ORDER — POTASSIUM CHLORIDE ER 10 MEQ PO CPCR: 10.0000 meq | ORAL_CAPSULE | Freq: Every day | ORAL | 0 refills | Status: DC

## 2018-10-23 MED ORDER — FUROSEMIDE 20 MG PO TABS: 20.0000 mg | ORAL_TABLET | Freq: Every day | ORAL | 1 refills | Status: DC

## 2018-10-23 NOTE — Telephone Encounter (Signed)
[address]       [place] [address]             [phone]     CARDIOTHORACIC SURGERY TELEPHONE VIRTUAL OFFICE NOTE  Referring Provider is No ref. provider found Primary Cardiologist is No primary care provider on file. PCP is [name], MD   HPI:  I spoke with [name] (DOB 1975/07/11 ) via telephone on 10/23/2018 at 2:52 PM and verified that I was speaking with the correct person using more than one form of identification.  We discussed the reason(s) for conducting our visit virtually instead of in-person.  The patient expressed understanding the circumstances and agreed to proceed as described.  Patient is 43 year old African-American female with mitral valve prolapse and stage D severe symptomatic primary mitral regurgitation.  She was seen in consultation on August 30, 2018 and at that time we made tentative plans for elective mitral valve repair next week.  I contacted the patient via telephone on September 24, 2018 and we made a decision at that time to postpone plans for elective mitral valve repair because of the ongoing COVID pandemic.  I called to speak with the patient today to see how she is doing.  She states that over the past 2 weeks she has developed increased exertional shortness of breath and orthopnea.  She denies resting shortness of breath.  She has had some mild swelling in her hands.  She has not had any fevers, chills, or cough.  She has not been weighing herself regularly.  Current Outpatient Medications  Medication Sig Dispense Refill  . cetirizine-pseudoephedrine (ZYRTEC-D) 5-120 MG tablet Take 1 tablet by mouth daily as needed for allergies.    . clindamycin (CLEOCIN T) 1 % external solution Apply 1 application topically 2 (two) times daily.   1  . doxylamine, Sleep, (UNISOM) 25 MG tablet Take 25 mg by mouth at bedtime as needed for sleep.    Marland Kitchen ibuprofen (ADVIL,MOTRIN) 200 MG tablet Take 800 mg by mouth daily as needed  for headache or moderate pain.    . nebivolol (BYSTOLIC) 10 MG tablet Take 1 tablet (10 mg total) by mouth daily. 90 tablet 0  . Oxymetazoline HCl (VICKS SINEX 12 HOUR NA) Place 1 spray into the nose daily as needed (allergies).    . tazarotene (AVAGE) 0.1 % cream Apply 1 application topically at bedtime.   2  . valACYclovir (VALTREX) 500 MG tablet Take 500 mg by mouth daily as needed (fever blisters).      No current facility-administered medications for this visit.      Diagnostic Tests:  n/a   Impression:  I am concerned about the patient's worsening symptoms of exertional shortness of breath and orthopnea.  She has stage D severe symptomatic primary mitral regurgitation which by definition is a medically necessary, time sensitive surgical intervention.  She may need to proceed with elective mitral valve repair sooner rather than later.  However, she is not currently on any diuretic therapy.   Plan:  I have given the patient a prescription for Lasix and potassium to take on a daily basis.  I have instructed the patient to record her weight every day and keep a journal to watch for trends.  We will check on her in approximately 2 weeks and see how she is doing.  If symptoms have not improved we may need to consider proceeding with mitral valve repair in the near future.  I discussed limitations of evaluation and management via telephone.  The patient was advised to call back for repeat telephone consultation or to seek an in-person evaluation if questions arise or the patient's clinical condition changes in any significant manner.  I spent in excess of 10 minutes of non-face-to-face time during the conduct of this telephone virtual office consultation.     Valentina Gu. Roxy Manns, MD 10/23/2018 2:52 PM

## 2018-10-26 ENCOUNTER — Ambulatory Visit: Payer: Self-pay | Admitting: *Deleted

## 2018-10-26 NOTE — Telephone Encounter (Signed)
  Reason for Disposition . HIGH RISK patient (e.g., age > 72 years, diabetes, heart or lung disease, weak immune system)  Protocols used: CORONAVIRUS (COVID-19) DIAGNOSED OR SUSPECTED-A-AH

## 2018-10-26 NOTE — Telephone Encounter (Addendum)
Patient calling with complaints of fever and body aches that started last night. Pt states that her temperature was 100.7 using a thermometer in her ear. Pt also has complaints of a headache and no appetite.Pt denies having a cough, SOB or sore throat at this time. Pt states she is scheduled to have a mitral valve repair possibly in the next couple of weeks and is scheduled for a virtual visit with her cardiologist on 11/05/18. 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 Pt verbalized understanding. Pt states she is due to return to work on Monday morning and will need a note for work. Pt advised that she could have a evisit through MyChart over the weekend so that she could have documentation for work.  Pt can be contacted at [phone]. Reason for Disposition . 1] COVID-19 infection diagnosed or suspected AND [2] mild symptoms (fever, cough) AND [2] no trouble breathing or other complications  Answer Assessment - Initial Assessment Questions 1. CLOSE CONTACT: "Who is the person with the confirmed or suspected COVID-19 infection that you were exposed to?"    n/a 2. PLACE of CONTACT: "Where were you when you were exposed to COVID-19?" (e.g., home, school, medical waiting room; which city?)     n/a 3. TYPE of CONTACT: "How much contact was there?" (e.g., sitting next to, live in same house, work in same office, same building)     n/a 4. DURATION of CONTACT: "How long were you in contact with the COVID-19 patient?" (e.g., a few seconds, passed by person, a few minutes, live with the patient)     n/a 5. DATE of CONTACT: "When did you have contact with a COVID-19 patient?" (e.g., how many days ago)     n/a 6. TRAVEL: "Have you traveled out of the country recently?" If so, "When and where?"     * Also ask about  out-of-state travel, since the CDC has identified some high risk cities for community spread in the [place].     * Note: Travel becomes less relevant if there is widespread community transmission where the patient lives.     unknown 7. COMMUNITY SPREAD: "Are there lots of cases or COVID-19 (community spread) where you live?" (See public health department website, if unsure)   * MAJOR community spread: high number of cases; numbers of cases are increasing; many people hospitalized.   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 SYMPTOMS: "Do you have any symptoms?" (e.g., fever, cough, breathing difficulty)    fever 9. PREGNANCY OR POSTPARTUM: "Is there any chance you are pregnant?" "When was your last menstrual period?" "Did you deliver in the last 2 weeks?"     Not assessed 10. HIGH RISK: "Do you have any heart or lung problems? Do you have a weak immune system?" (e.g., CHF, COPD, asthma, HIV positive, chemotherapy, renal failure, diabetes mellitus, sickle cell anemia)      Mitral valve repair  Answer Assessment - Initial Assessment Questions 1. COVID-19 DIAGNOSIS: "Who made your Coronavirus (COVID-19) diagnosis?" "Was it confirmed by a positive lab test?" If not diagnosed by a HCP, ask "Are  there lots of cases (community spread) where you live?" (See public health department website, if unsure)   * MAJOR community spread: high number of cases; numbers of cases are increasing; many people hospitalized.   * MINOR community spread: low number of cases; not increasing; few or no people hospitalized     No positive contact with COVID-19 + patient 2. ONSET: "When did the COVID-19 symptoms start?"      yesterday 3. WORST SYMPTOM: "What is your worst symptom?" (e.g., cough, fever, shortness of breath, muscle aches)     Body aches 4. COUGH: "How bad is the cough?"        No cough 5. FEVER: "Do you have a fever?" If so, ask: "What is your temperature, how was it measured, and when did it start?"     100.7 6. RESPIRATORY STATUS: "Describe your breathing?" (e.g., shortness of breath, wheezing, unable to speak)      no 7. BETTER-SAME-WORSE: "Are you getting better, staying the same or getting worse compared to yesterday?"  If getting worse, ask, "In what way?"    Worse compared to yesterday 8. HIGH RISK DISEASE: "Do you have any chronic medical problems?" (e.g., asthma, heart or lung disease, weak immune system, etc.)     Due to have a mitral valve repair 61. PREGNANCY: "Is there any chance you are pregnant?" "When was your last menstrual period?"     n/a 10. OTHER SYMPTOMS: "Do you have any other symptoms?"  (e.g., runny nose, headache, sore throat, loss of smell)       No but no appetite  Protocols used: CORONAVIRUS (COVID-19) DIAGNOSED OR SUSPECTED-A-AH, CORONAVIRUS (COVID-19) EXPOSURE-A-AH

## 2018-10-27 ENCOUNTER — Emergency Department (HOSPITAL_COMMUNITY): Payer: 59

## 2018-10-27 ENCOUNTER — Emergency Department (HOSPITAL_COMMUNITY)
Admission: EM | Admit: 2018-10-27 | Discharge: 2018-10-27 | Disposition: A | Discharge status: Home / Self Care | Payer: 59 | Attending: Emergency Medicine | Admitting: Emergency Medicine

## 2018-10-27 ENCOUNTER — Other Ambulatory Visit: Payer: Self-pay

## 2018-10-27 DIAGNOSIS — Z20828 Contact with and (suspected) exposure to other viral communicable diseases: Secondary | ICD-10-CM | POA: Diagnosis not present

## 2018-10-27 DIAGNOSIS — Z87891 Personal history of nicotine dependence: Secondary | ICD-10-CM | POA: Diagnosis not present

## 2018-10-27 DIAGNOSIS — E039 Hypothyroidism, unspecified: Secondary | ICD-10-CM | POA: Diagnosis not present

## 2018-10-27 DIAGNOSIS — Z79899 Other long term (current) drug therapy: Secondary | ICD-10-CM | POA: Insufficient documentation

## 2018-10-27 DIAGNOSIS — I10 Essential (primary) hypertension: Secondary | ICD-10-CM | POA: Diagnosis not present

## 2018-10-27 DIAGNOSIS — B349 Viral infection, unspecified: Secondary | ICD-10-CM | POA: Insufficient documentation

## 2018-10-27 DIAGNOSIS — G43809 Other migraine, not intractable, without status migrainosus: Secondary | ICD-10-CM | POA: Insufficient documentation

## 2018-10-27 DIAGNOSIS — M791 Myalgia, unspecified site: Secondary | ICD-10-CM | POA: Diagnosis present

## 2018-10-27 LAB — CBC
HCT: 38.7 % (ref 36.0–46.0)
Hemoglobin: 12.6 g/dL (ref 12.0–15.0)
MCH: 31 pg (ref 26.0–34.0)
MCHC: 32.6 g/dL (ref 30.0–36.0)
MCV: 95.3 fL (ref 80.0–100.0)
Platelets: 182 10*3/uL (ref 150–400)
RBC: 4.06 MIL/uL (ref 3.87–5.11)
RDW: 13.6 % (ref 11.5–15.5)
WBC: 17.7 10*3/uL — ABNORMAL HIGH (ref 4.0–10.5)
nRBC: 0 % (ref 0.0–0.2)

## 2018-10-27 LAB — COMPREHENSIVE METABOLIC PANEL
ALT: 29 U/L (ref 0–44)
AST: 29 U/L (ref 15–41)
Albumin: 3.5 g/dL (ref 3.5–5.0)
Alkaline Phosphatase: 79 U/L (ref 38–126)
Anion gap: 8 (ref 5–15)
BUN: 8 mg/dL (ref 6–20)
CO2: 21 mmol/L — ABNORMAL LOW (ref 22–32)
Calcium: 8.2 mg/dL — ABNORMAL LOW (ref 8.9–10.3)
Chloride: 102 mmol/L (ref 98–111)
Creatinine, Ser: 1.08 mg/dL — ABNORMAL HIGH (ref 0.44–1.00)
GFR calc Af Amer: >60 mL/min (ref >60)
GFR calc non Af Amer: >60 mL/min (ref >60)
Glucose, Bld: 113 mg/dL — ABNORMAL HIGH (ref 70–99)
Potassium: 3.3 mmol/L — ABNORMAL LOW (ref 3.5–5.1)
Sodium: 131 mmol/L — ABNORMAL LOW (ref 135–145)
Total Bilirubin: 1.1 mg/dL (ref 0.3–1.2)
Total Protein: 7.2 g/dL (ref 6.5–8.1)

## 2018-10-27 MED ORDER — PROCHLORPERAZINE EDISYLATE 10 MG/2ML IJ SOLN
10.0000 mg | Freq: Once | INTRAMUSCULAR | Status: AC
  Administered 2018-10-27: 10 mg via INTRAVENOUS
  Filled 2018-10-27: qty 2

## 2018-10-27 MED ORDER — SODIUM CHLORIDE 0.9 % IV BOLUS
1000.0000 mL | Freq: Once | INTRAVENOUS | Status: AC
  Administered 2018-10-27: 09:00:00 1000 mL via INTRAVENOUS

## 2018-10-27 MED ORDER — MORPHINE SULFATE (PF) 4 MG/ML IV SOLN
4.0000 mg | Freq: Once | INTRAVENOUS | Status: AC
  Administered 2018-10-27: 09:00:00 4 mg via INTRAVENOUS
  Filled 2018-10-27: qty 1

## 2018-10-27 MED ORDER — KETOROLAC TROMETHAMINE 30 MG/ML IJ SOLN
30.0000 mg | Freq: Once | INTRAMUSCULAR | Status: AC
  Administered 2018-10-27: 30 mg via INTRAVENOUS
  Filled 2018-10-27: qty 1

## 2018-10-27 NOTE — ED Provider Notes (Addendum)
[facility] DEPT Provider Note   CSN: [id number] Arrival date & time: 10/27/18  0805    History   Chief Complaint Chief Complaint  Patient presents with  . Chills  . Migraine    HPI [name] is a 43 y.o. female.     HPI Patient is a 43 year old female who works as a Education officer, museum at a dialysis center.  She presents the emergency department with chills myalgias and temp of 100.7.  She denies cough and denies sore throat.  No known sick contacts.  No known exposure with COVID-19 positive patients or patients under investigation for COVID-19.  Denies urinary symptoms.  No abdominal pain.  Denies nausea vomiting diarrhea.  Her major complaint at this time is more headache related.  She has a history of migraine headaches but reports is been many years.  She denies neck stiffness.   Past Medical History:  Diagnosis Date  . Adenomatous colon polyp 12/2012  . Allergic rhinitis   . Allergy   . Burn of unspecified degree of lip(s)   . Cellulitis   . Common migraine   . Diastolic dysfunction   . External hemorrhoid   . Genital herpes   . GERD (gastroesophageal reflux disease)   . Heart murmur   . Hx of migraines   . Hyperlipidemia   . Hypertension   . Lynch syndrome   . Mitral regurgitation   . MVP (mitral valve prolapse)   . Palpitations   . Plica syndrome of left knee     Patient Active Problem List   Diagnosis Date Noted  . Essential hypertension 07/10/2018  . Routine general medical examination at a health care facility 07/10/2018  . Cervical lymphadenopathy 05/12/2016  . Tonsil stone 05/12/2016  . Snoring 10/28/2015  . Other specified hypothyroidism 09/22/2014  . Visit for screening mammogram 09/22/2014  . Insomnia, persistent 03/19/2014  . Lynch syndrome 11/28/2012  . Family history of colon cancer 08/13/2012  . Cellulitis 11/23/2011  . MR (mitral regurgitation) 10/28/2011  . MVP (mitral valve prolapse) 10/28/2011  .  COMMON MIGRAINE 03/26/2008  . ALLERGIC RHINITIS 11/06/2007  . GERD 11/06/2007    Past Surgical History:  Procedure Laterality Date  . ABDOMINAL HYSTERECTOMY    . COLONOSCOPY    . KNEE ARTHROSCOPY WITH MEDIAL MENISECTOMY Left 06/07/2013   Procedure: LEFT KNEE ARTHROSCOPY WITH SYNOVECTOMY LIMITED, ARTHROSCOPY KNEE WITH DEBRIDEMENT/SHAVING (CHONDROPLASTY), ARTHROSCOPY KNEE  ChondroplastyWITH MEDIAL AND LATERAL  MENISECTOMY;  Surgeon: Renette Butters, MD;  Location: [facility];  Service: Orthopedics;  Laterality: Left;  . left knee arthroscopy    . patrial hysterectomy    . POLYPECTOMY    . SYNOVECTOMY Left 06/07/2013   Procedure: SYNOVECTOMY;  Surgeon: Renette Butters, MD;  Location: [facility];  Service: Orthopedics;  Laterality: Left;  . TEE WITHOUT CARDIOVERSION  10/11/2011   Procedure: TRANSESOPHAGEAL ECHOCARDIOGRAM (TEE);  Surgeon: Josue Hector, MD;  Location: [facility];  Service: Cardiovascular;  Laterality: N/A;  . TUBAL LIGATION    . WISDOM TOOTH EXTRACTION       OB History    Gravida  3   Para  2   Term  2   Preterm      AB  1   Living  2     SAB      TAB  1   Ectopic      Multiple      Live Births  Home Medications    Prior to Admission medications   Medication Sig Start Date End Date Taking? Authorizing Provider  clindamycin (CLEOCIN T) 1 % external solution Apply 1 application topically 2 (two) times daily.  01/05/18  Yes [provider]  doxylamine, Sleep, (UNISOM) 25 MG tablet Take 25 mg by mouth at bedtime as needed for sleep.   Yes [provider]  furosemide (LASIX) 20 MG tablet Take 1 tablet (20 mg total) by mouth daily. 10/23/18  Yes [name], MD  nebivolol (BYSTOLIC) 10 MG tablet Take 1 tablet (10 mg total) by mouth daily. 07/10/18  Yes [name], MD  potassium chloride (MICRO-K) 10 MEQ CR capsule Take 1 capsule (10 mEq total) by mouth daily. 10/23/18  Yes [name], MD  tazarotene (AVAGE) 0.1 % cream Apply 1 application topically at bedtime.  01/05/18  Yes [provider]  valACYclovir (VALTREX) 500 MG tablet Take 500 mg by mouth daily as needed (fever blisters).  08/04/14  Yes [provider]    Family History Family History  Problem Relation Age of Onset  . Colon cancer Paternal Aunt 67  . Colon polyps Maternal Uncle   . Colon cancer Father 30  . Colon cancer Paternal Uncle 33  . Multiple myeloma Maternal Grandmother 9  . Prostate cancer Maternal Uncle 64  . Colon cancer Paternal Uncle 59  . Parkinsonism Paternal Grandfather   . Breast cancer Other        Paternal Event organiser  . Breast cancer Cousin        lynch syndrome   . Esophageal cancer Neg Hx   . Rectal cancer Neg Hx     Social History Social History   Tobacco Use  . Smoking status: Former Smoker    Packs/day: 0.50    Years: 12.00    Pack years: 6.00    Types: Cigarettes    Last attempt to quit: 11/29/2010    Years since quitting: 7.9  . Smokeless tobacco: Never Used  Substance Use Topics  . Alcohol use: Yes    Alcohol/week: 0.0 standard drinks    Comment: social  . Drug use: No     Allergies   Patient has no known allergies.   Review of Systems Review of Systems  All other systems reviewed and are negative.    Physical Exam Updated Vital Signs BP 134/80   Pulse (!) 101   Temp 97.7 F (36.5 C) (Oral)   Resp 16   Ht _0  (1.6 m)   Wt 83.9 kg   LMP 01/12/2012   SpO2 97%   BMI 32.77 kg/m   Physical Exam Vitals signs and nursing note reviewed.  Constitutional:      General: She is not in acute distress.    Appearance: She is well-developed.  HENT:     Head: Normocephalic and atraumatic.  Eyes:     Pupils: Pupils are equal, round, and reactive to light.  Neck:     Musculoskeletal: Normal range of motion.  Cardiovascular:     Rate and Rhythm: Normal rate and regular rhythm.     Heart sounds: Normal heart sounds.   Pulmonary:     Effort: Pulmonary effort is normal.     Breath sounds: Normal breath sounds.  Abdominal:     General: There is no distension.     Palpations: Abdomen is soft.     Tenderness: There is no abdominal tenderness.  Musculoskeletal: Normal range of motion.  Skin:    General: Skin is warm and dry.  Neurological:     Mental Status: She is alert and oriented to person, place, and time.     Comments: 5/5 strength in major muscle groups of  bilateral upper and lower extremities. Speech normal. No facial asymetry.   Psychiatric:        Judgment: Judgment normal.      ED Treatments / Results  Labs (all labs ordered are listed, but only abnormal results are displayed) Labs Reviewed  CBC - Abnormal; Notable for the following components:      Result Value   WBC 17.7 (*)    All other components within normal limits  COMPREHENSIVE METABOLIC PANEL - Abnormal; Notable for the following components:   Sodium 131 (*)    Potassium 3.3 (*)    CO2 21 (*)    Glucose, Bld 113 (*)    Creatinine, Ser 1.08 (*)    Calcium 8.2 (*)    All other components within normal limits  NOVEL CORONAVIRUS, NAA (HOSPITAL ORDER, SEND-OUT TO REF LAB)    EKG None  Radiology Dg Chest Portable 1 View  Result Date: 10/27/2018 CLINICAL DATA:  Shortness of breath with fever and chills. EXAM: PORTABLE CHEST 1 VIEW COMPARISON:  Chest radiograph 12/22/2016. FINDINGS: The heart size and mediastinal contours are within normal limits. Both lungs are clear. The visualized skeletal structures are unremarkable. IMPRESSION: No active disease. Electronically Signed   By: Staci Righter M.D.   On: 10/27/2018 09:19    Procedures Procedures (including critical care time)  Medications Ordered in ED Medications  prochlorperazine (COMPAZINE) injection 10 mg (10 mg Intravenous Given 10/27/18 0905)  morphine 4 MG/ML injection 4 mg (4 mg Intravenous Given 10/27/18 0905)  ketorolac (TORADOL) 30 MG/ML injection 30 mg (30 mg  Intravenous Given 10/27/18 0905)  sodium chloride 0.9 % bolus 1,000 mL (1,000 mLs Intravenous New Bag/Given 10/27/18 0904)     Initial Impression / Assessment and Plan / ED Course  I have reviewed the triage vital signs and the nursing notes.  Pertinent labs & imaging results that were available during my care of the patient were reviewed by me and considered in my medical decision making (see chart for details).        Overall well-appearing here in the emergency department.  Migraine headache resolved with treatment in the ER.  Patient with viral syndrome.  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  She has been given a work note until next Thursday.  She understands that she is under investigation with a pending COVID-19 test at this time.  She will follow this on my chart  Final Clinical Impressions(s) / ED Diagnoses   Final diagnoses:  Viral syndrome  Other migraine without status migrainosus, not intractable    ED Discharge Orders    None       Jola Schmidt, MD 10/27/18 1021    Jola Schmidt, MD 10/27/18 1022

## 2018-10-27 NOTE — Discharge Instructions (Addendum)
Follow up on your COVID testing on MyChart

## 2018-10-27 NOTE — ED Triage Notes (Signed)
Per Pt: Patient reports she has a migraine. Pt reports she had a fever of 100.7, chills, and body aches.  Pt denies cough.  Pt denies N/V/D and reports some feelings of difficulty breathing when she has a mask on.

## 2018-10-28 LAB — SARS CORONAVIRUS 2 BY RT PCR (HOSPITAL ORDER, PERFORMED IN ~~LOC~~ HOSPITAL LAB): SARS Coronavirus 2: NEGATIVE

## 2018-10-29 ENCOUNTER — Other Ambulatory Visit (HOSPITAL_COMMUNITY): Payer: 59

## 2018-10-29 ENCOUNTER — Telehealth: Payer: Self-pay | Admitting: Thoracic Surgery (Cardiothoracic Vascular Surgery)

## 2018-10-29 ENCOUNTER — Ambulatory Visit (HOSPITAL_COMMUNITY): Payer: 59

## 2018-10-29 ENCOUNTER — Other Ambulatory Visit: Payer: 59

## 2018-10-29 ENCOUNTER — Ambulatory Visit: Payer: 59 | Admitting: Thoracic Surgery (Cardiothoracic Vascular Surgery)

## 2018-10-29 ENCOUNTER — Telehealth: Payer: Self-pay | Admitting: Internal Medicine

## 2018-10-29 NOTE — Telephone Encounter (Signed)
Patient called [facility] on 10/27/2018 at 4:42pm stating she was seen at the hospital and tested for COVID 19.  She was treated for a migraine and sent home.  She was not prescribed any meds to take home.  States she had been running a fever around 100.7. I followed up with patient this morning by phone.  She states she was feeling better.  States her fever was 100.5 up to 7:30pm yesterday and then broke after taking Excedrin.   Patient states she does not feel she needs a visit right now.   Would like to know if Dr. Sharlet Salina could follow up with giving her the results of the COVID 19 testing done at the hospital?

## 2018-10-29 NOTE — Telephone Encounter (Signed)
Not sure why patient requested Dr. Sharlet Salina to look up results as she has never seen patient.

## 2018-10-29 NOTE — Telephone Encounter (Signed)
lvm for pt to call back for results.   Result was negative

## 2018-10-29 NOTE — Telephone Encounter (Signed)
Called patient to check on her in follow-up to her virtual office visit last week.  She states that over the past week her breathing has improved since she resumed taking Lasix.  She states that she feels well.  No further recommendations at this time.  We will plan another follow-up virtual office visit in 1 week and hopefully be able to make plans for surgical intervention in the near future.  [name], MD 10/29/2018 11:32 AM

## 2018-10-30 ENCOUNTER — Other Ambulatory Visit (INDEPENDENT_AMBULATORY_CARE_PROVIDER_SITE_OTHER): Payer: 59

## 2018-10-30 ENCOUNTER — Ambulatory Visit (INDEPENDENT_AMBULATORY_CARE_PROVIDER_SITE_OTHER): Payer: 59 | Admitting: Internal Medicine

## 2018-10-30 ENCOUNTER — Encounter: Payer: Self-pay | Admitting: Internal Medicine

## 2018-10-30 DIAGNOSIS — R739 Hyperglycemia, unspecified: Secondary | ICD-10-CM

## 2018-10-30 DIAGNOSIS — J0100 Acute maxillary sinusitis, unspecified: Secondary | ICD-10-CM | POA: Insufficient documentation

## 2018-10-30 DIAGNOSIS — E876 Hypokalemia: Secondary | ICD-10-CM | POA: Insufficient documentation

## 2018-10-30 DIAGNOSIS — G43019 Migraine without aura, intractable, without status migrainosus: Secondary | ICD-10-CM

## 2018-10-30 DIAGNOSIS — T502X5A Adverse effect of carbonic-anhydrase inhibitors, benzothiadiazides and other diuretics, initial encounter: Secondary | ICD-10-CM

## 2018-10-30 DIAGNOSIS — E038 Other specified hypothyroidism: Secondary | ICD-10-CM

## 2018-10-30 DIAGNOSIS — I10 Essential (primary) hypertension: Secondary | ICD-10-CM | POA: Diagnosis not present

## 2018-10-30 LAB — BASIC METABOLIC PANEL
BUN: 7 mg/dL (ref 6–23)
CO2: 24 mEq/L (ref 19–32)
Calcium: 9 mg/dL (ref 8.4–10.5)
Chloride: 101 mEq/L (ref 96–112)
Creatinine, Ser: 1.07 mg/dL (ref 0.40–1.20)
GFR: 67.32 mL/min (ref >60.00)
Glucose, Bld: 103 mg/dL — ABNORMAL HIGH (ref 70–99)
Potassium: 3.2 mEq/L — ABNORMAL LOW (ref 3.5–5.1)
Sodium: 136 mEq/L (ref 135–145)

## 2018-10-30 LAB — CBC WITH DIFFERENTIAL/PLATELET
Basophils Absolute: 0.1 10*3/uL (ref 0.0–0.1)
Basophils Relative: 0.6 % (ref 0.0–3.0)
Eosinophils Absolute: 0.1 10*3/uL (ref 0.0–0.7)
Eosinophils Relative: 0.6 % (ref 0.0–5.0)
HCT: 39.7 % (ref 36.0–46.0)
Hemoglobin: 13.4 g/dL (ref 12.0–15.0)
Lymphocytes Relative: 11.7 % — ABNORMAL LOW (ref 12.0–46.0)
Lymphs Abs: 1.1 10*3/uL (ref 0.7–4.0)
MCHC: 33.7 g/dL (ref 30.0–36.0)
MCV: 92 fl (ref 78.0–100.0)
Monocytes Absolute: 0.6 10*3/uL (ref 0.1–1.0)
Monocytes Relative: 6.4 % (ref 3.0–12.0)
Neutro Abs: 7.4 10*3/uL (ref 1.4–7.7)
Neutrophils Relative %: 80.7 % — ABNORMAL HIGH (ref 43.0–77.0)
Platelets: 290 10*3/uL (ref 150.0–400.0)
RBC: 4.32 Mil/uL (ref 3.87–5.11)
RDW: 13.8 % (ref 11.5–15.5)
WBC: 9.2 10*3/uL (ref 4.0–10.5)

## 2018-10-30 LAB — TSH: TSH: 1.48 u[IU]/mL (ref 0.35–4.50)

## 2018-10-30 LAB — MAGNESIUM: Magnesium: 2.2 mg/dL (ref 1.5–2.5)

## 2018-10-30 LAB — HEMOGLOBIN A1C: Hgb A1c MFr Bld: 5.7 % (ref 4.6–6.5)

## 2018-10-30 MED ORDER — CEFDINIR 300 MG PO CAPS: 300.0000 mg | ORAL_CAPSULE | Freq: Two times a day (BID) | ORAL | 0 refills | Status: AC

## 2018-10-30 MED ORDER — UBROGEPANT 100 MG PO TABS: 1.0000 | ORAL_TABLET | ORAL | 5 refills | Status: DC | PRN

## 2018-10-30 NOTE — Progress Notes (Signed)
Virtual Visit via Video Note  I connected with [name] on 10/31/18 at 10:00 AM EDT by a video enabled telemedicine application and verified that I am speaking with the correct person using two identifiers.   I discussed the limitations of evaluation and management by telemedicine and the availability of in person appointments. The patient expressed understanding and agreed to proceed.  History of Present Illness: She checked in for a virtual visit.  She was not willing to come in for an in person visit due to the COVID-19 pandemic.  She complains of an intermittent stabbing headache for 4-5 days.  She points to her left parietal and left occipital regions to describe the location of the headache. She says this is her typical migraine headache.  The headaches were previously treated with Topamax and Maxalt.  She has not been using either 1 of those recently.  She was seen in the ED 3 days ago.  In the ED she was found to have an elevated white cell count with low sodium and low potassium.  She denies nausea, vomiting, abdominal pain, diarrhea, dizziness, or lightheadedness.  She also complains of a several day history of runny nose, facial pain, fever to 101.5, and chills.  She denies cough, earache, lymphadenopathy, rash, shortness of breath, or chest pain.    Observations/Objective: She was in no distress.  She had a hair net on and was fixing her hair.  Her speech was normal.  She was calm, cooperative, and appropriate.  Lab Results  Component Value Date   WBC 9.2 10/30/2018   HGB 13.4 10/30/2018   HCT 39.7 10/30/2018   PLT 290.0 10/30/2018   GLUCOSE 103 (H) 10/30/2018   CHOL 179 07/10/2018   TRIG 142.0 07/10/2018   HDL 54.70 07/10/2018   LDLCALC 96 07/10/2018   ALT 29 10/27/2018   AST 29 10/27/2018   NA 136 10/30/2018   K 3.2 (L) 10/30/2018   CL 101 10/30/2018   CREATININE 1.07 10/30/2018   BUN 7 10/30/2018   CO2 24 10/30/2018   TSH 1.48 10/30/2018   HGBA1C 5.7  10/30/2018     Assessment and Plan: She has signs and symptoms of acute bacterial sinusitis.  Her white cell count has normalized.  I have asked her to take a 10-day course of a cephalosporin antibiotic  She has mild hypokalemia due to the loop diuretic.  I have asked her to increase her potassium supplement to twice a day.    I have also asked her to try a CGRP antagonist Roselyn Meier) to treat the migraine headache.  Her TSH is in the normal range.  Thyroid replacement therapy is not indicated.   Follow Up Instructions: She agrees to the above recommendations.  She will let me know if she develops any new or worsening symptoms.  She will liberalize her intake of liquids that are rich in potassium.    I discussed the assessment and treatment plan with the patient. The patient was provided an opportunity to ask questions and all were answered. The patient agreed with the plan and demonstrated an understanding of the instructions.   The patient was advised to call back or seek an in-person evaluation if the symptoms worsen or if the condition fails to improve as anticipated.  I provided 25 minutes of non-face-to-face time during this encounter.   [name] Calico, MD

## 2018-10-31 ENCOUNTER — Inpatient Hospital Stay (HOSPITAL_COMMUNITY)
Admission: RE | Admit: 2018-10-31 | Payer: 59 | Source: Home / Self Care | Admitting: Thoracic Surgery (Cardiothoracic Vascular Surgery)

## 2018-10-31 ENCOUNTER — Encounter: Payer: Self-pay | Admitting: Internal Medicine

## 2018-10-31 ENCOUNTER — Encounter (HOSPITAL_COMMUNITY): Admission: RE | Payer: Self-pay | Source: Home / Self Care

## 2018-10-31 ENCOUNTER — Other Ambulatory Visit: Payer: Self-pay | Admitting: Internal Medicine

## 2018-10-31 DIAGNOSIS — E876 Hypokalemia: Secondary | ICD-10-CM

## 2018-10-31 DIAGNOSIS — T502X5A Adverse effect of carbonic-anhydrase inhibitors, benzothiadiazides and other diuretics, initial encounter: Principal | ICD-10-CM

## 2018-10-31 SURGERY — REPAIR, MITRAL VALVE, MINIMALLY INVASIVE
Anesthesia: General | Site: Chest | Laterality: Right

## 2018-10-31 MED ORDER — POTASSIUM CHLORIDE ER 10 MEQ PO CPCR: 10.0000 meq | ORAL_CAPSULE | Freq: Two times a day (BID) | ORAL | 1 refills | Status: DC

## 2018-10-31 MED ORDER — FLUCONAZOLE 150 MG PO TABS: 150.0000 mg | ORAL_TABLET | Freq: Once | ORAL | 3 refills | Status: AC

## 2018-11-02 ENCOUNTER — Ambulatory Visit
Admission: RE | Admit: 2018-11-02 | Discharge: 2018-11-02 | Disposition: A | Discharge status: Home / Self Care | Payer: 59 | Source: Ambulatory Visit | Attending: Thoracic Surgery (Cardiothoracic Vascular Surgery) | Admitting: Thoracic Surgery (Cardiothoracic Vascular Surgery)

## 2018-11-02 ENCOUNTER — Other Ambulatory Visit: Payer: Self-pay

## 2018-11-02 DIAGNOSIS — Z01818 Encounter for other preprocedural examination: Secondary | ICD-10-CM

## 2018-11-02 DIAGNOSIS — I712 Thoracic aortic aneurysm, without rupture, unspecified: Secondary | ICD-10-CM

## 2018-11-02 DIAGNOSIS — I7409 Other arterial embolism and thrombosis of abdominal aorta: Secondary | ICD-10-CM

## 2018-11-02 DIAGNOSIS — I340 Nonrheumatic mitral (valve) insufficiency: Secondary | ICD-10-CM

## 2018-11-05 ENCOUNTER — Other Ambulatory Visit: Payer: Self-pay | Admitting: *Deleted

## 2018-11-05 ENCOUNTER — Encounter: Payer: Self-pay | Admitting: *Deleted

## 2018-11-05 ENCOUNTER — Telehealth (INDEPENDENT_AMBULATORY_CARE_PROVIDER_SITE_OTHER): Payer: 59 | Admitting: Thoracic Surgery (Cardiothoracic Vascular Surgery)

## 2018-11-05 ENCOUNTER — Other Ambulatory Visit: Payer: Self-pay

## 2018-11-05 DIAGNOSIS — I341 Nonrheumatic mitral (valve) prolapse: Secondary | ICD-10-CM

## 2018-11-05 DIAGNOSIS — I340 Nonrheumatic mitral (valve) insufficiency: Secondary | ICD-10-CM

## 2018-11-05 NOTE — Patient Instructions (Signed)
Continue all previous medications without any changes at this time  

## 2018-11-05 NOTE — Progress Notes (Signed)
[address]       [place] [address]             [phone]     CARDIOTHORACIC SURGERY TELEPHONE VIRTUAL OFFICE NOTE  Referring Provider is [name], MD Primary Cardiologist is No primary care provider on file. PCP is [name], MD   HPI:  I spoke with [name] (DOB 1976-02-28 ) via telephone on 11/05/2018 at 11:19 AM and verified that I was speaking with the correct person using more than one form of identification.  We discussed the reason(s) for conducting our visit virtually instead of in-person.  The patient expressed understanding the circumstances and agreed to proceed as described.  Patient is 43 year old African-American female with mitral valve prolapse and stage D severe symptomatic primary mitral regurgitation. She was seen in consultation on August 30, 2018 and at that time we made tentative plans for elective mitral valve repair next week. I contacted the patient via telephone on September 24, 2018 and we made a decision at that time to postpone plans for elective mitral valve repair because of the ongoing COVID pandemic.  2 weeks ago the patient was contacted over the telephone and complained of worsening shortness of breath and orthopnea.  Lasix was resumed at that time.  She states that over the past 2 weeks she has been doing some better although she still gets short of breath when she lays flat in bed.  She otherwise denies resting shortness of breath.  She has not been having any chest pain or chest tightness.  She denies any fevers, chills, or productive cough.  She is concerned by her worsening exertional shortness of breath and hopes to proceed with elective mitral valve repair in the near future.   Current Outpatient Medications  Medication Sig Dispense Refill   cefdinir (OMNICEF) 300 MG capsule Take 1 capsule (300 mg total) by mouth 2 (two) times daily for 10 days. 20 capsule 0   clindamycin (CLEOCIN T) 1 % external  solution Apply 1 application topically 2 (two) times daily.   1   doxylamine, Sleep, (UNISOM) 25 MG tablet Take 25 mg by mouth at bedtime as needed for sleep.     furosemide (LASIX) 20 MG tablet Take 1 tablet (20 mg total) by mouth daily. 30 tablet 1   nebivolol (BYSTOLIC) 10 MG tablet Take 1 tablet (10 mg total) by mouth daily. 90 tablet 0   potassium chloride (MICRO-K) 10 MEQ CR capsule Take 1 capsule (10 mEq total) by mouth 2 (two) times daily. 180 capsule 1   tazarotene (AVAGE) 0.1 % cream Apply 1 application topically at bedtime.   2   Ubrogepant (UBRELVY) 100 MG TABS Take 1 tablet by mouth as needed. 10 tablet 5   valACYclovir (VALTREX) 500 MG tablet Take 500 mg by mouth daily as needed (fever blisters).      No current facility-administered medications for this visit.      Diagnostic Tests:  Cardiac/Coronary  CT  TECHNIQUE: The patient was scanned on a [employer].  FINDINGS: A 120 kV prospective scan was triggered in the descending thoracic aorta at 111 HU's. Axial non-contrast 3 mm slices were carried out through the heart. The data set was analyzed on a dedicated work station and scored using the [place]. Gantry rotation speed was 250 msecs and collimation was .6 mm. No beta blockade and 0.8 mg of sl NTG was given. The 3D data set  was reconstructed in 5% intervals of the 67-82 % of the R-R cycle. Diastolic phases were analyzed on a dedicated work station using MPR, MIP and VRT modes. The patient received 80 cc of contrast.  Aorta:  Normal size.  No calcifications.  No dissection.  Aortic Valve:  Trileaflet.  No calcifications.  Coronary Arteries:  Normal coronary origin.  Right dominance.  RCA is a large dominant artery that gives rise to PDA and PLVB. There is no plaque.  Left main is a large artery that gives rise to LAD and LCX arteries. Left main has no plaque.  LAD is a large vessel that gives rise to one diagonal artery and  has no plaque.  LCX is a non-dominant artery that gives rise to one large OM1 branch. There is no plaque.  Other findings:  Normal pulmonary vein drainage into the left atrium.  Normal let atrial appendage without a thrombus.  Normal size of the pulmonary artery.  IMPRESSION: 1. Coronary calcium score of 0. This was 0 percentile for age and sex matched control.  2. Normal coronary origin with right dominance.  3. No evidence of CAD.   Electronically Signed   By: Ena Dawley   On: 08/28/2018 17:33         Impression:  Patient has mitral valve prolapse with stage D severe symptomatic primary mitral regurgitation.  She has had recent acceleration of symptoms of exertional shortness of breath and orthopnea which has been temporized by addition of a diuretic to her daily medications.  I personally reviewed the patient's recent transthoracic echocardiogram performed at [facility].  I have also reviewed the patient's transesophageal echocardiogram performed in 2013 at our institution.  Patient has myxomatous disease with bileaflet prolapse.  There are obvious segments that appear flail or with ruptured chordae tendinae.  On the patient's recent transthoracic echocardiogram the jet of regurgitation is primarily central and directed slightly posteriorly.  The jet is broad and appears to fill the entire left atrium.  There has definitely been some interval mild left ventricular and left atrial chamber enlargement.  Overall the mechanism of mitral regurgitation appears similar on the patient's recent transthoracic echocardiogram as it did on the transesophageal echocardiogram performed in 2013.  Coronary CT angiogram reveals no evidence of any significant coronary artery disease.  There is normal coronary artery anatomy with right dominant circulation.  Given the development of mild left ventricular systolic dysfunction and worsening symptoms of congestive  heart failure, I favor proceeding with elective mitral valve repair in the near future.  Although the patient does not require urgent hospitalization and surgery, I would be reluctant to postpone surgical intervention for more than another 2 to 3 weeks.    Plan:  The patient was counseled at length regarding the indications, risks and potential benefits of mitral valve repair.  The rationale for elective surgery has been explained, including a comparison between surgery and continued medical therapy with close follow-up.  The likelihood of successful and durable valve repair has been discussed with particular reference to the findings of their recent echocardiogram.  We discussed the timing of surgery particular with reference to the recent COVID-19 pandemic.  Expectations for the patient's postoperative convalescence have been discussed.  The patient hopes to proceed with surgery in the near future.  The patient will undergo CT angiography to evaluate the feasibility of peripheral cannulation for surgery.  We will tentatively plan for mitral valve repair on Nov 22, 2018 providing that restrictions  related to the ongoing COVID-19 pandemic facilitate adequate resources and appropriate safety.     I discussed limitations of evaluation and management via telephone.  The patient was advised to call back for repeat telephone consultation or to seek an in-person evaluation if questions arise or the patient's clinical condition changes in any significant manner.  I spent in excess of 10 minutes of non-face-to-face time during the conduct of this telephone virtual office consultation.     Valentina Gu. Roxy Manns, MD 11/05/2018 11:19 AM

## 2018-11-09 ENCOUNTER — Ambulatory Visit
Admission: RE | Admit: 2018-11-09 | Discharge: 2018-11-09 | Disposition: A | Discharge status: Home / Self Care | Payer: 59 | Source: Ambulatory Visit | Attending: Thoracic Surgery (Cardiothoracic Vascular Surgery) | Admitting: Thoracic Surgery (Cardiothoracic Vascular Surgery)

## 2018-11-09 ENCOUNTER — Other Ambulatory Visit: Payer: Self-pay

## 2018-11-09 MED ORDER — IOPAMIDOL (ISOVUE-370) INJECTION 76%
75.0000 mL | Freq: Once | INTRAVENOUS | Status: AC | PRN
  Administered 2018-11-09: 75 mL via INTRAVENOUS

## 2018-11-13 ENCOUNTER — Encounter: Payer: Self-pay | Admitting: Thoracic Surgery (Cardiothoracic Vascular Surgery)

## 2018-11-16 ENCOUNTER — Other Ambulatory Visit: Payer: Self-pay

## 2018-11-16 NOTE — Pre-Procedure Instructions (Signed)
[name]  11/16/2018      [facility] [place], [place] AT [facility] [address] [place] [address] Phone: [phone] Fax: [phone]    Your procedure is scheduled on 11-22-18 Thursday  Report to [place] at [place].M.  Call this number if you have problems the morning of surgery:  [phone]   Remember:  Do not eat or drink after midnight.     Take these medicines the morning of surgery with A SIP OF WATER :  BYSTOLIC (Nebivolol)  As needed:Ubrelvy  7 days prior to surgery STOP taking any Aspirin (unless otherwise instructed by your surgeon), Aleve, Naproxen, Ibuprofen, Motrin, Advil, Goody's, BC's, all herbal medications, fish oil, and all vitamins.    Do not wear jewelry, make-up or nail polish.  Do not wear lotions, powders, or perfumes, or deodorant.  Do not shave 48 hours prior to surgery.  Men may shave face and neck.  Do not bring valuables to the hospital.  [facility] is not responsible for any belongings or valuables.  Contacts, dentures or bridgework may not be worn into surgery.  Leave your suitcase in the car.  After surgery it may be brought to your room.  For patients admitted to the hospital, discharge time will be determined by your treatment team.  Patients discharged the day of surgery will not be allowed to drive home.   Special instructions:   [facility]- Preparing For Surgery  Before surgery, you can play an important role. Because skin is not sterile, your skin needs to be as free of germs as possible. You can reduce the number of germs on your skin by washing with CHG (chlorahexidine gluconate) Soap before surgery.  CHG is an antiseptic cleaner which kills germs and bonds with the skin to continue killing germs even after washing.    Oral Hygiene is also important to reduce your risk of infection.  Remember - BRUSH YOUR TEETH THE MORNING OF  SURGERY WITH YOUR REGULAR TOOTHPASTE  Please do not use if you have an allergy to CHG or antibacterial soaps. If your skin becomes reddened/irritated stop using the CHG.  Do not shave (including legs and underarms) for at least 48 hours prior to first CHG shower. It is OK to shave your face.  Please follow these instructions carefully.   1. Shower the NIGHT BEFORE SURGERY and the MORNING OF SURGERY with CHG.   2. If you chose to wash your hair, wash your hair first as usual with your normal shampoo.  3. After you shampoo, rinse your hair and body thoroughly to remove the shampoo.  4. Use CHG as you would any other liquid soap. You can apply CHG directly to the skin and wash gently with a scrungie or a clean washcloth.   5. Apply the CHG Soap to your body ONLY FROM THE NECK DOWN.  Do not use on open wounds or open sores. Avoid contact with your eyes, ears, mouth and genitals (private parts). Wash Face and genitals (private parts)  with your normal soap.  6. Wash thoroughly, paying special attention to the area where your surgery will be performed.  7. Thoroughly rinse your body with warm water from the neck down.  8. DO NOT shower/wash with your normal soap after using and rinsing off the CHG Soap.  9. Pat yourself dry with a CLEAN TOWEL.  10.  Wear CLEAN PAJAMAS to bed the night before surgery, wear comfortable clothes the morning of surgery  11. Place CLEAN SHEETS on your bed the night of your first shower and DO NOT SLEEP WITH PETS.  Day of Surgery:  Do not apply any deodorants/lotions.  Please wear clean clothes to the hospital/surgery center.   Remember to brush your teeth WITH YOUR REGULAR TOOTHPASTE.   Please read over the following fact sheets that you were given. Pain Booklet, Coughing and Deep Breathing, MRSA Information and Surgical Site Infection Prevention

## 2018-11-19 ENCOUNTER — Ambulatory Visit (HOSPITAL_COMMUNITY)
Admission: RE | Admit: 2018-11-19 | Discharge: 2018-11-19 | Disposition: A | Discharge status: Home / Self Care | Payer: 59 | Source: Ambulatory Visit | Attending: Thoracic Surgery (Cardiothoracic Vascular Surgery) | Admitting: Thoracic Surgery (Cardiothoracic Vascular Surgery)

## 2018-11-19 ENCOUNTER — Ambulatory Visit: Payer: 59 | Admitting: Thoracic Surgery (Cardiothoracic Vascular Surgery)

## 2018-11-19 ENCOUNTER — Other Ambulatory Visit: Payer: Self-pay

## 2018-11-19 ENCOUNTER — Encounter: Payer: Self-pay | Admitting: Thoracic Surgery (Cardiothoracic Vascular Surgery)

## 2018-11-19 ENCOUNTER — Other Ambulatory Visit: Payer: Self-pay | Admitting: *Deleted

## 2018-11-19 ENCOUNTER — Encounter (HOSPITAL_COMMUNITY)
Admission: RE | Admit: 2018-11-19 | Discharge: 2018-11-19 | Disposition: A | Discharge status: Home / Self Care | Payer: 59 | Source: Ambulatory Visit | Attending: Thoracic Surgery (Cardiothoracic Vascular Surgery) | Admitting: Thoracic Surgery (Cardiothoracic Vascular Surgery)

## 2018-11-19 ENCOUNTER — Encounter (HOSPITAL_COMMUNITY): Payer: Self-pay

## 2018-11-19 ENCOUNTER — Other Ambulatory Visit (HOSPITAL_COMMUNITY)
Admission: RE | Admit: 2018-11-19 | Discharge: 2018-11-19 | Disposition: A | Discharge status: Home / Self Care | Payer: 59 | Source: Ambulatory Visit | Attending: Thoracic Surgery (Cardiothoracic Vascular Surgery) | Admitting: Thoracic Surgery (Cardiothoracic Vascular Surgery)

## 2018-11-19 ENCOUNTER — Other Ambulatory Visit (HOSPITAL_COMMUNITY): Payer: 59

## 2018-11-19 VITALS — BP 114/70 | HR 83 | Temp 98.3°F | Resp 16 | Ht 63.0 in | Wt 181.0 lb

## 2018-11-19 DIAGNOSIS — I340 Nonrheumatic mitral (valve) insufficiency: Secondary | ICD-10-CM

## 2018-11-19 DIAGNOSIS — N3001 Acute cystitis with hematuria: Secondary | ICD-10-CM

## 2018-11-19 DIAGNOSIS — I341 Nonrheumatic mitral (valve) prolapse: Secondary | ICD-10-CM | POA: Diagnosis not present

## 2018-11-19 LAB — TYPE AND SCREEN
ABO/RH(D): O POS
Antibody Screen: NEGATIVE

## 2018-11-19 LAB — BLOOD GAS, ARTERIAL
Acid-base deficit: 0.3 mmol/L (ref 0.0–2.0)
Bicarbonate: 23.4 mmol/L (ref 20.0–28.0)
Drawn by: 519031
FIO2: 21
O2 Saturation: 93.7 %
Patient temperature: 98.6
pCO2 arterial: 35.3 mmHg (ref 32.0–48.0)
pH, Arterial: 7.437 (ref 7.350–7.450)
pO2, Arterial: 66.7 mmHg — ABNORMAL LOW (ref 83.0–108.0)

## 2018-11-19 LAB — URINALYSIS, ROUTINE W REFLEX MICROSCOPIC
Bilirubin Urine: NEGATIVE
Glucose, UA: NEGATIVE mg/dL
Hgb urine dipstick: NEGATIVE
Ketones, ur: NEGATIVE mg/dL
Nitrite: POSITIVE — AB
Protein, ur: NEGATIVE mg/dL
Specific Gravity, Urine: 1.019 (ref 1.005–1.030)
WBC, UA: >50 WBC/hpf — ABNORMAL HIGH (ref 0–5)
pH: 5 (ref 5.0–8.0)

## 2018-11-19 LAB — SURGICAL PCR SCREEN
MRSA, PCR: POSITIVE — AB
Staphylococcus aureus: POSITIVE — AB

## 2018-11-19 LAB — CBC
HCT: 41.7 % (ref 36.0–46.0)
Hemoglobin: 13.1 g/dL (ref 12.0–15.0)
MCH: 30 pg (ref 26.0–34.0)
MCHC: 31.4 g/dL (ref 30.0–36.0)
MCV: 95.6 fL (ref 80.0–100.0)
Platelets: 279 10*3/uL (ref 150–400)
RBC: 4.36 MIL/uL (ref 3.87–5.11)
RDW: 12.7 % (ref 11.5–15.5)
WBC: 6.5 10*3/uL (ref 4.0–10.5)
nRBC: 0 % (ref 0.0–0.2)

## 2018-11-19 LAB — HEMOGLOBIN A1C
Hgb A1c MFr Bld: 5.5 % (ref 4.8–5.6)
Mean Plasma Glucose: 111.15 mg/dL

## 2018-11-19 LAB — COMPREHENSIVE METABOLIC PANEL
ALT: 17 U/L (ref 0–44)
AST: 18 U/L (ref 15–41)
Albumin: 3.7 g/dL (ref 3.5–5.0)
Alkaline Phosphatase: 63 U/L (ref 38–126)
Anion gap: 11 (ref 5–15)
BUN: 8 mg/dL (ref 6–20)
CO2: 19 mmol/L — ABNORMAL LOW (ref 22–32)
Calcium: 8.8 mg/dL — ABNORMAL LOW (ref 8.9–10.3)
Chloride: 107 mmol/L (ref 98–111)
Creatinine, Ser: 0.95 mg/dL (ref 0.44–1.00)
GFR calc Af Amer: >60 mL/min (ref >60)
GFR calc non Af Amer: >60 mL/min (ref >60)
Glucose, Bld: 111 mg/dL — ABNORMAL HIGH (ref 70–99)
Potassium: 4 mmol/L (ref 3.5–5.1)
Sodium: 137 mmol/L (ref 135–145)
Total Bilirubin: 0.5 mg/dL (ref 0.3–1.2)
Total Protein: 7 g/dL (ref 6.5–8.1)

## 2018-11-19 LAB — PROTIME-INR
INR: 1 (ref 0.8–1.2)
Prothrombin Time: 13.3 seconds (ref 11.4–15.2)

## 2018-11-19 LAB — APTT: aPTT: 34 seconds (ref 24–36)

## 2018-11-19 LAB — ABO/RH: ABO/RH(D): O POS

## 2018-11-19 MED ORDER — CIPROFLOXACIN HCL 500 MG PO TABS: 500.0000 mg | ORAL_TABLET | Freq: Two times a day (BID) | ORAL | 0 refills | Status: DC

## 2018-11-19 NOTE — Progress Notes (Signed)
  Coronavirus Screening  Have you experienced the following symptoms:  Cough yes/no: No Fever (>100.42F)  yes/no: No Runny nose yes/no: No Sore throat yes/no: No Difficulty breathing/shortness of breath  yes/no: No Loss of sense or smell:No Have you or a family member traveled in the last 14 days and where? yes/no: No Pt scheduled for COVID test at [place] [place] today.  If the patient indicates "YES" to the above questions, their PAT will be rescheduled to limit the exposure to others and, the surgeon will be notified. THE PATIENT WILL NEED TO BE ASYMPTOMATIC FOR 14 DAYS.   If the patient is not experiencing any of these symptoms, the PAT nurse will instruct them to NOT bring anyone with them to their appointment since they may have these symptoms or traveled as well.   Please remind your patients and families that hospital visitation restrictions are in effect and the importance of the restrictions.

## 2018-11-19 NOTE — Progress Notes (Signed)
[address]       [place] [address]             [phone]     CARDIOTHORACIC SURGERY OFFICE NOTE  Referring Provider is [name], MD Primary Cardiologist is No primary care provider on file. PCP is [name], MD   HPI:  Patient is a 43 year old obese African-American female with history of mitral valve prolapse and stage D severe symptomatic primary mitral regurgitation who returns the office today with tentative plans to proceed with elective mitral valve repair later this week.  Over the past 2 weeks she has remained clinically stable.  She states that ever since she resumed taking Lasix 3 weeks ago she has been feeling much better.  She now gets short of breath only with more strenuous exertion and she has been sleeping comfortably at night.  She has still been going to work but she has been practicing strict social distancing per guidelines.  She has not developed any fever, cough, or worsening shortness of breath.  She otherwise feels well and she is eager to proceed with surgical intervention as previously planned.   Current Outpatient Medications  Medication Sig Dispense Refill  . Biotin w/ Vitamins C & E (HAIR SKIN & NAILS GUMMIES PO) Take 2 tablets by mouth daily.    . clindamycin (CLEOCIN T) 1 % external solution Apply 1 application topically 2 (two) times daily.   1  . furosemide (LASIX) 20 MG tablet Take 1 tablet (20 mg total) by mouth daily. 30 tablet 1  . Multiple Vitamin (MULTIVITAMIN WITH MINERALS) TABS tablet Take 1 tablet by mouth daily.    . nebivolol (BYSTOLIC) 10 MG tablet Take 1 tablet (10 mg total) by mouth daily. 90 tablet 0  . potassium chloride (MICRO-K) 10 MEQ CR capsule Take 1 capsule (10 mEq total) by mouth 2 (two) times daily. 180 capsule 1  . tazarotene (AVAGE) 0.1 % cream Apply 1 application topically at bedtime.   2  . Ubrogepant (UBRELVY) 100 MG TABS Take 1 tablet by mouth as needed. (Patient taking differently:  Take 1 tablet by mouth daily as needed (migraine). ) 10 tablet 5   No current facility-administered medications for this visit.       Physical Exam:   BP 114/70 (BP Location: Right Arm, Patient Position: Sitting, Cuff Size: Large)   Pulse 83   Temp 98.3 F (36.8 C) (Oral)   Resp 16   Ht 5\' 3"  (1.6 m)   Wt 181 lb (82.1 kg)   LMP 01/12/2012   SpO2 98% Comment: RA  BMI 32.06 kg/m   General:  Well-appearing  Chest:   Clear to auscultation  CV:   Regular rate and rhythm  Incisions:  n/a  Abdomen:  Soft nontender  Extremities:  Warm and well-perfused  Diagnostic Tests:  CT ANGIOGRAPHY CHEST, ABDOMEN AND PELVIS  TECHNIQUE: Multidetector CT imaging through the chest, abdomen and pelvis was performed using the standard protocol during bolus administration of intravenous contrast. Multiplanar reconstructed images and MIPs were obtained and reviewed to evaluate the vascular anatomy.  CONTRAST:  37mL ISOVUE-370 IOPAMIDOL (ISOVUE-370) INJECTION 76%  COMPARISON:  CT scan of April 30, 2014.  FINDINGS: CTA CHEST FINDINGS  Cardiovascular: Preferential opacification of the thoracic aorta. No evidence of thoracic aortic aneurysm or dissection. Normal heart size. No pericardial effusion.  Mediastinum/Nodes: No enlarged mediastinal, hilar, or axillary lymph nodes. Thyroid gland, trachea, and esophagus demonstrate no significant findings.  Lungs/Pleura: Lungs are clear. No pleural effusion or pneumothorax.  Musculoskeletal: No chest wall abnormality. No acute or significant osseous findings.  Review of the MIP images confirms the above findings.  CTA ABDOMEN AND PELVIS FINDINGS  VASCULAR  Aorta: Normal caliber aorta without aneurysm, dissection, vasculitis or significant stenosis.  Celiac: Patent without evidence of aneurysm, dissection, vasculitis or significant stenosis.  SMA: Patent without evidence of aneurysm, dissection, vasculitis or significant  stenosis.  Renals: Both renal arteries are patent without evidence of aneurysm, dissection, vasculitis, fibromuscular dysplasia or significant stenosis.  IMA: Patent without evidence of aneurysm, dissection, vasculitis or significant stenosis.  Inflow: Patent without evidence of aneurysm, dissection, vasculitis or significant stenosis.  Veins: No obvious venous abnormality within the limitations of this arterial phase study.  Review of the MIP images confirms the above findings.  NON-VASCULAR  Hepatobiliary: Stable right hepatic cyst. No gallstones, gallbladder wall thickening, or biliary dilatation.  Pancreas: Unremarkable. No pancreatic ductal dilatation or surrounding inflammatory changes.  Spleen: Normal in size without focal abnormality.  Adrenals/Urinary Tract: Adrenal glands appear normal. Stable left renal cysts. No hydronephrosis or renal obstruction is noted. No renal or ureteral calculi are noted. Urinary bladder is unremarkable.  Stomach/Bowel: Stomach is within normal limits. Appendix appears normal. No evidence of bowel wall thickening, distention, or inflammatory changes.  Lymphatic: No significant adenopathy is noted.  Reproductive: Status post hysterectomy. No adnexal masses.  Other: No abdominal wall hernia or abnormality. No abdominopelvic ascites.  Musculoskeletal: No acute or significant osseous findings.  Review of the MIP images confirms the above findings.  IMPRESSION: No evidence of thoracic or abdominal aortic dissection or aneurysm. No significant vascular abnormality is noted.  Stable right hepatic and left renal cysts are noted.  No other abnormality seen in the chest, abdomen or pelvis.   Electronically Signed   By: Marijo Conception M.D.   On: 11/09/2018 14:33    Impression:  Patient has mitral valve prolapse with stage D severe symptomatic primary mitral regurgitation.  She has had recent acceleration  of symptoms of exertional shortness of breath and orthopnea which has been temporized by addition of a diuretic to her daily medications.  I personally reviewed the patient's recent transthoracic echocardiogram performed at [facility]. I have also reviewed the patient's transesophageal echocardiogram performed in 2013 at our institution. Patient has myxomatous disease with bileaflet prolapse.  There are no obvious segments that appearflail or withruptured chordae tendinae. On the patient's recent transthoracic echocardiogram the jet of regurgitation isprimarilycentralanddirected slightly posteriorly. The jet is broad and appears to fill the entire left atrium. There has been some interval left ventricular and left atrial chamber enlargement.Overall the mechanism of mitral regurgitation appears similar on the patient's recent transthoracic echocardiogram as it did on the transesophageal echocardiogram performed in 2013.  Coronary CT angiogram reveals no evidence of any significant coronary artery disease.  There is normal coronary artery anatomy with right dominant circulation.  Given the development of mild left ventricular systolic dysfunction and worsening symptoms of congestive heart failure, I favor proceeding with elective mitral valve repair in the near future.     Plan:  The patient was again counseled at length regarding the indications, risks and potential benefits of mitral valve repair.  The rationale for elective surgery has been explained, including a comparison between surgery and continued medical therapy with close follow-up.  The likelihood of successful and durable valve repair has been discussed with particular reference to the findings of their recent  echocardiogram.  We discussed the timing of surgery particular with reference to the recent COVID-19 pandemic.  Expectations for the patient's postoperative convalescence have been discussed. Alternative  surgical approaches have been discussed including a comparison between conventional sternotomy and minimally-invasive techniques.  The relative risks and benefits of each have been reviewed as they pertain to the patient's specific circumstances, and expectations for the patient's postoperative convalescence has been discussed.   In the unlikely event that the patient's valve cannot be successfully repaired, we discussed the possibility of replacing the mitral valve using a mechanical prosthesis with the attendant need for long-term anticoagulation versus the alternative of replacing it using a bioprosthetic tissue valve with its potential for late structural valve deterioration and failure, depending upon the patient's longevity.  The patient specifically requests that if the mitral valve must be replaced that it be done using a mechanical valve.     The patient understands and accepts all potential risks of surgery including but not limited to risk of death, stroke or other neurologic complication, myocardial infarction, congestive heart failure, respiratory failure, renal failure, bleeding requiring transfusion and/or reexploration, arrhythmia, infection or other wound complications, pneumonia, pleural and/or pericardial effusion, pulmonary embolus, aortic dissection or other major vascular complication, or delayed complications related to valve repair or replacement including but not limited to structural valve deterioration and failure, thrombosis, embolization, endocarditis, or paravalvular leak.  Specific risks potentially related to the minimally-invasive approach were discussed at length, including but not limited to risk of conversion to full or partial sternotomy, aortic dissection or other major vascular complication, unilateral acute lung injury or pulmonary edema, phrenic nerve dysfunction or paralysis, rib fracture, chronic pain, lung hernia, or lymphocele. All of their questions have been answered.    I spent in excess of 15 minutes during the conduct of this office consultation and >50% of this time involved direct face-to-face encounter with the patient for counseling and/or coordination of their care.   Valentina Gu. Roxy Manns, MD 11/19/2018 12:53 PM

## 2018-11-19 NOTE — Patient Instructions (Signed)
   Continue taking all current medications without change through the day before surgery.  Have nothing to eat or drink after midnight the night before surgery.  On the morning of surgery do not take any medications   

## 2018-11-19 NOTE — Progress Notes (Signed)
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 Mupirocin Rx called in to pharmacy. Pt notified.  Levonne Spiller from Dr. Ricard Dillon office notified of abnormal labs (+ve Leukocytess & Nitrites in Urine)

## 2018-11-19 NOTE — Progress Notes (Signed)
PCP - Dr. [name]  Cardiologist - DR. James Hochran  Chest x-ray - 11/19/18(Epic)  EKG - 11/19/18(Epic)  Stress Test - 06/05/2013  ECHO - 08/28/18  Cardiac Cath - denies  AICD-denies PM-denies LOOP-denies  Sleep Study - denies CPAP - NA  LABS-CBC,CMP,PT-INR,APTT,UA,ABG,A1C,T/S,UA  ASA-denies Pt taking Bystolic. Instructed to take morning dose on day of surgery.  ERAS-NA  HA1C-11-19-18 Fasting Blood Sugar -  Checks Blood Sugar __0___ times a day  Anesthesia-Yes . Cardiac. Pt denies having chest pain, sob, or fever at this time. All instructions explained to the pt, with a verbal understanding of the material. Pt agrees to go over the instructions while at home for a better understanding. The opportunity to ask questions was provided.

## 2018-11-19 NOTE — Progress Notes (Signed)
Pre op heart surgery.  Vascular ultrasound eval.   Preliminary results can be found under CV proc through chart review. June Leap, BS, RDMS, RVT

## 2018-11-20 ENCOUNTER — Other Ambulatory Visit: Payer: Self-pay | Admitting: Thoracic Surgery (Cardiothoracic Vascular Surgery)

## 2018-11-20 ENCOUNTER — Encounter: Payer: Self-pay | Admitting: Thoracic Surgery (Cardiothoracic Vascular Surgery)

## 2018-11-20 ENCOUNTER — Ambulatory Visit: Payer: 59 | Admitting: Internal Medicine

## 2018-11-20 ENCOUNTER — Encounter: Payer: Self-pay | Admitting: Internal Medicine

## 2018-11-20 DIAGNOSIS — N3001 Acute cystitis with hematuria: Secondary | ICD-10-CM

## 2018-11-20 LAB — NOVEL CORONAVIRUS, NAA (HOSP ORDER, SEND-OUT TO REF LAB; TAT 18-24 HRS): SARS-CoV-2, NAA: NOT DETECTED

## 2018-11-20 NOTE — Anesthesia Preprocedure Evaluation (Addendum)
Anesthesia Evaluation  Patient identified by MRN, date of birth, ID band Patient awake    Reviewed: Allergy & Precautions, NPO status , Patient's Chart, lab work & pertinent test results  History of Anesthesia Complications Negative for: history of anesthetic complications  Airway Mallampati: II  TM Distance: >3 FB Neck ROM: Full    Dental  (+) Teeth Intact   Pulmonary neg shortness of breath, neg sleep apnea, neg COPD, neg recent URI, former smoker (quit 2012),    breath sounds clear to auscultation       Cardiovascular hypertension, Pt. on medications and Pt. on home beta blockers (-) CAD + Valvular Problems/Murmurs MR  Rhythm:Regular  08/2018 ECHO: EF 52%, mild bileaflet mitral prolapse with mod MR   Neuro/Psych  Headaches, negative psych ROS   GI/Hepatic Neg liver ROS, GERD  ,  Endo/Other  Hypothyroidism   Renal/GU negative Renal ROS     Musculoskeletal   Abdominal   Peds  Hematology negative hematology ROS (+)   Anesthesia Other Findings   Reproductive/Obstetrics                           Anesthesia Physical Anesthesia Plan  ASA: IV  Anesthesia Plan: General   Post-op Pain Management:    Induction: Intravenous  PONV Risk Score and Plan: 3 and Treatment may vary due to age or medical condition  Airway Management Planned: Oral ETT  Additional Equipment: Arterial line, PA Cath, CVP, TEE and Ultrasound Guidance Line Placement  Intra-op Plan:   Post-operative Plan: Post-operative intubation/ventilation  Informed Consent: I have reviewed the patients History and Physical, chart, labs and discussed the procedure including the risks, benefits and alternatives for the proposed anesthesia with the patient or authorized representative who has indicated his/her understanding and acceptance.       Plan Discussed with: Surgeon and CRNA  Anesthesia Plan Comments: (TTE 08/02/18 (care  everywhere): MILD LV DYSFUNCTION EF 50%  NORMAL LA PRESSURES WITH NORMAL DIASTOLIC FUNCTION  NORMAL RIGHT VENTRICULAR SYSTOLIC FUNCTION  VALVULAR REGURGITATION: MILD MR, TRIVIAL PR, TRIVIAL TR  NO VALVULAR STENOSIS  MILD BILEAFLET PROLAPSE WITH MILD TO MODERATE MR  LV EF LOWER LIMITS OF NORMAL    Compared with prior Echo study on 08/02/2017: NO SIGNIFICANT CHANGES)       Anesthesia Quick Evaluation

## 2018-11-21 ENCOUNTER — Encounter: Payer: Self-pay | Admitting: Thoracic Surgery (Cardiothoracic Vascular Surgery)

## 2018-11-21 ENCOUNTER — Telehealth: Payer: Self-pay | Admitting: Thoracic Surgery (Cardiothoracic Vascular Surgery)

## 2018-11-21 ENCOUNTER — Other Ambulatory Visit: Payer: Self-pay | Admitting: *Deleted

## 2018-11-21 DIAGNOSIS — N3001 Acute cystitis with hematuria: Secondary | ICD-10-CM

## 2018-11-21 MED ORDER — VANCOMYCIN HCL 1000 MG IV SOLR
INTRAVENOUS | Status: AC
  Filled 2018-11-21: qty 1000

## 2018-11-21 MED ORDER — EPINEPHRINE PF 1 MG/ML IJ SOLN
0.0000 ug/min | INTRAVENOUS | Status: AC
  Filled 2018-11-21: qty 4

## 2018-11-21 MED ORDER — CIPROFLOXACIN HCL 500 MG PO TABS: 500.0000 mg | ORAL_TABLET | Freq: Two times a day (BID) | ORAL | 0 refills | Status: DC

## 2018-11-21 MED ORDER — TRANEXAMIC ACID 1000 MG/10ML IV SOLN
1.5000 mg/kg/h | INTRAVENOUS | Status: AC
  Filled 2018-11-21: qty 25

## 2018-11-21 MED ORDER — CIPROFLOXACIN HCL 500 MG PO TABS: 500.0000 mg | ORAL_TABLET | Freq: Two times a day (BID) | ORAL | 0 refills | Status: AC

## 2018-11-21 MED ORDER — NITROGLYCERIN IN D5W 200-5 MCG/ML-% IV SOLN
2.0000 ug/min | INTRAVENOUS | Status: AC
  Filled 2018-11-21: qty 250

## 2018-11-21 MED ORDER — SODIUM CHLORIDE 0.9 % IV SOLN
1.5000 g | INTRAVENOUS | Status: AC
  Filled 2018-11-21: qty 1.5

## 2018-11-21 MED ORDER — INSULIN REGULAR(HUMAN) IN NACL 100-0.9 UT/100ML-% IV SOLN
INTRAVENOUS | Status: AC
  Filled 2018-11-21: qty 100

## 2018-11-21 MED ORDER — KENNESTONE BLOOD CARDIOPLEGIA (KBC) MANNITOL SYRINGE (20%, 32ML)
32.0000 mL | Freq: Once | INTRAVENOUS | Status: DC
  Filled 2018-11-21: qty 1

## 2018-11-21 MED ORDER — POTASSIUM CHLORIDE 2 MEQ/ML IV SOLN
80.0000 meq | INTRAVENOUS | Status: AC
  Filled 2018-11-21: qty 40

## 2018-11-21 MED ORDER — MAGNESIUM SULFATE 50 % IJ SOLN
40.0000 meq | INTRAMUSCULAR | Status: AC
  Filled 2018-11-21: qty 9.85

## 2018-11-21 MED ORDER — KENNESTONE BLOOD CARDIOPLEGIA VIAL
13.0000 mL | Freq: Once | Status: DC
  Filled 2018-11-21: qty 1

## 2018-11-21 MED ORDER — SODIUM CHLORIDE 0.9 % IV SOLN
750.0000 mg | INTRAVENOUS | Status: AC
  Filled 2018-11-21: qty 750

## 2018-11-21 MED ORDER — VANCOMYCIN HCL 10 G IV SOLR
1250.0000 mg | INTRAVENOUS | Status: AC
  Filled 2018-11-21: qty 1250

## 2018-11-21 MED ORDER — TRANEXAMIC ACID (OHS) PUMP PRIME SOLUTION
2.0000 mg/kg | INTRAVENOUS | Status: AC
  Filled 2018-11-21: qty 1.64

## 2018-11-21 MED ORDER — MILRINONE LACTATE IN DEXTROSE 20-5 MG/100ML-% IV SOLN
0.3000 ug/kg/min | INTRAVENOUS | Status: AC
  Filled 2018-11-21: qty 100

## 2018-11-21 MED ORDER — DOPAMINE-DEXTROSE 3.2-5 MG/ML-% IV SOLN
0.0000 ug/kg/min | INTRAVENOUS | Status: AC
  Filled 2018-11-21: qty 250

## 2018-11-21 MED ORDER — PHENYLEPHRINE HCL-NACL 20-0.9 MG/250ML-% IV SOLN
30.0000 ug/min | INTRAVENOUS | Status: AC
  Filled 2018-11-21: qty 250

## 2018-11-21 MED ORDER — PLASMA-LYTE 148 IV SOLN
INTRAVENOUS | Status: AC
  Filled 2018-11-21: qty 2.5

## 2018-11-21 MED ORDER — TRANEXAMIC ACID (OHS) BOLUS VIA INFUSION
15.0000 mg/kg | INTRAVENOUS | Status: AC
  Filled 2018-11-21: qty 1232

## 2018-11-21 MED ORDER — SODIUM CHLORIDE 0.9 % IV SOLN
INTRAVENOUS | Status: AC
  Filled 2018-11-21: qty 30

## 2018-11-21 MED ORDER — DEXMEDETOMIDINE HCL IN NACL 400 MCG/100ML IV SOLN
0.1000 ug/kg/h | INTRAVENOUS | Status: AC
  Filled 2018-11-21: qty 100

## 2018-11-21 NOTE — Telephone Encounter (Signed)
I spoke with patient this evening regarding results of her urinalysis performed as part of routine preoperative testing.  We had attempted to call in a prescription for ciprofloxacin to begin yesterday morning, but the pharmacy reportedly did not get the prescription and a second prescription was called in today.  The patient just took her first dose this afternoon.  I discussed the results of the urinalysis with the patient who states that she has not been having any fevers or symptoms of dysuria.  She has had urinary tract infections in the past but none recently.  We discussed alternatives including whether or not to proceed with surgery tomorrow morning as originally planned versus postponing for a few days.  She agrees it would probably be best to postpone surgery so that she can complete her course of oral antibiotics.  We tentatively plan to reschedule her surgery for Nov 28, 2018.  The patient has been urged to make an attempt to remain quarantined between now and then and understands that she may need repeat COVID19 testing prior to her surgery next week.  All questions answered.  [name], MD 11/21/2018 8:30 PM

## 2018-11-22 ENCOUNTER — Other Ambulatory Visit: Payer: Self-pay | Admitting: *Deleted

## 2018-11-22 DIAGNOSIS — I340 Nonrheumatic mitral (valve) insufficiency: Secondary | ICD-10-CM

## 2018-11-27 ENCOUNTER — Other Ambulatory Visit: Payer: Self-pay

## 2018-11-27 ENCOUNTER — Other Ambulatory Visit (HOSPITAL_COMMUNITY)
Admission: RE | Admit: 2018-11-27 | Discharge: 2018-11-27 | Disposition: A | Discharge status: Home / Self Care | Payer: 59 | Source: Ambulatory Visit | Attending: Thoracic Surgery (Cardiothoracic Vascular Surgery) | Admitting: Thoracic Surgery (Cardiothoracic Vascular Surgery)

## 2018-11-27 ENCOUNTER — Encounter (HOSPITAL_COMMUNITY)
Admission: RE | Admit: 2018-11-27 | Discharge: 2018-11-27 | Disposition: A | Discharge status: Home / Self Care | Payer: 59 | Source: Ambulatory Visit | Attending: Thoracic Surgery (Cardiothoracic Vascular Surgery) | Admitting: Thoracic Surgery (Cardiothoracic Vascular Surgery)

## 2018-11-27 DIAGNOSIS — I340 Nonrheumatic mitral (valve) insufficiency: Secondary | ICD-10-CM | POA: Insufficient documentation

## 2018-11-27 DIAGNOSIS — Z1159 Encounter for screening for other viral diseases: Secondary | ICD-10-CM | POA: Diagnosis not present

## 2018-11-27 LAB — URINALYSIS, ROUTINE W REFLEX MICROSCOPIC
Bilirubin Urine: NEGATIVE
Glucose, UA: NEGATIVE mg/dL
Hgb urine dipstick: NEGATIVE
Ketones, ur: NEGATIVE mg/dL
Leukocytes,Ua: NEGATIVE
Nitrite: NEGATIVE
Protein, ur: NEGATIVE mg/dL
Specific Gravity, Urine: 1.001 — ABNORMAL LOW (ref 1.005–1.030)
pH: 6 (ref 5.0–8.0)

## 2018-11-27 LAB — SARS CORONAVIRUS 2 BY RT PCR (HOSPITAL ORDER, PERFORMED IN ~~LOC~~ HOSPITAL LAB): SARS Coronavirus 2: NEGATIVE

## 2018-11-27 MED ORDER — POTASSIUM CHLORIDE 2 MEQ/ML IV SOLN
80.0000 meq | INTRAVENOUS | Status: DC
  Filled 2018-11-27: qty 40

## 2018-11-27 MED ORDER — KENNESTONE BLOOD CARDIOPLEGIA VIAL
13.0000 mL | Status: DC
  Filled 2018-11-27: qty 13

## 2018-11-27 MED ORDER — SODIUM CHLORIDE 0.9 % IV SOLN
1.5000 g | INTRAVENOUS | Status: AC
  Administered 2018-11-28: 09:00:00 1.5 g via INTRAVENOUS
  Administered 2018-11-28: 12:00:00 .75 g via INTRAVENOUS
  Filled 2018-11-27: qty 1.5

## 2018-11-27 MED ORDER — NITROGLYCERIN IN D5W 200-5 MCG/ML-% IV SOLN
2.0000 ug/min | INTRAVENOUS | Status: DC
  Filled 2018-11-27: qty 250

## 2018-11-27 MED ORDER — MILRINONE LACTATE IN DEXTROSE 20-5 MG/100ML-% IV SOLN
0.3000 ug/kg/min | INTRAVENOUS | Status: DC
  Filled 2018-11-27: qty 100

## 2018-11-27 MED ORDER — VANCOMYCIN HCL 1000 MG IV SOLR
INTRAVENOUS | Status: AC
  Administered 2018-11-28: 11:00:00 1000 mL
  Filled 2018-11-27: qty 1000

## 2018-11-27 MED ORDER — VANCOMYCIN HCL 10 G IV SOLR
1250.0000 mg | INTRAVENOUS | Status: AC
  Administered 2018-11-28: 07:00:00 1250 mg via INTRAVENOUS
  Filled 2018-11-27: qty 1250

## 2018-11-27 MED ORDER — DOPAMINE-DEXTROSE 3.2-5 MG/ML-% IV SOLN
0.0000 ug/kg/min | INTRAVENOUS | Status: DC
  Filled 2018-11-27: qty 250

## 2018-11-27 MED ORDER — SODIUM CHLORIDE 0.9 % IV SOLN
INTRAVENOUS | Status: DC
  Filled 2018-11-27: qty 30

## 2018-11-27 MED ORDER — DEXMEDETOMIDINE HCL IN NACL 400 MCG/100ML IV SOLN
0.1000 ug/kg/h | INTRAVENOUS | Status: DC
  Filled 2018-11-27: qty 100

## 2018-11-27 MED ORDER — EPINEPHRINE PF 1 MG/ML IJ SOLN
0.0000 ug/min | INTRAVENOUS | Status: DC
  Filled 2018-11-27: qty 4

## 2018-11-27 MED ORDER — SODIUM CHLORIDE 0.9 % IV SOLN
750.0000 mg | INTRAVENOUS | Status: DC
  Filled 2018-11-27: qty 750

## 2018-11-27 MED ORDER — INSULIN REGULAR(HUMAN) IN NACL 100-0.9 UT/100ML-% IV SOLN
INTRAVENOUS | Status: AC
  Administered 2018-11-28: 09:00:00 .8 [IU]/h via INTRAVENOUS
  Filled 2018-11-27: qty 100

## 2018-11-27 MED ORDER — KENNESTONE BLOOD CARDIOPLEGIA (KBC) MANNITOL SYRINGE (20%, 32ML)
32.0000 mL | INTRAVENOUS | Status: DC
  Filled 2018-11-27: qty 32

## 2018-11-27 MED ORDER — MAGNESIUM SULFATE 50 % IJ SOLN
40.0000 meq | INTRAMUSCULAR | Status: DC
  Filled 2018-11-27: qty 9.85

## 2018-11-27 MED ORDER — TRANEXAMIC ACID (OHS) PUMP PRIME SOLUTION
2.0000 mg/kg | INTRAVENOUS | Status: DC
  Filled 2018-11-27: qty 1.64

## 2018-11-27 MED ORDER — TRANEXAMIC ACID 1000 MG/10ML IV SOLN
1.5000 mg/kg/h | INTRAVENOUS | Status: AC
  Administered 2018-11-28: 09:00:00 1.5 mg/kg/h via INTRAVENOUS
  Filled 2018-11-27: qty 25

## 2018-11-27 MED ORDER — NOREPINEPHRINE 4 MG/250ML-% IV SOLN
0.0000 ug/min | INTRAVENOUS | Status: DC
  Filled 2018-11-27: qty 250

## 2018-11-27 MED ORDER — PLASMA-LYTE 148 IV SOLN
INTRAVENOUS | Status: DC
  Filled 2018-11-27: qty 2.5

## 2018-11-27 MED ORDER — TRANEXAMIC ACID (OHS) BOLUS VIA INFUSION
15.0000 mg/kg | INTRAVENOUS | Status: AC
  Administered 2018-11-28: 09:00:00 1231.5 mg via INTRAVENOUS
  Filled 2018-11-27: qty 1232

## 2018-11-27 MED ORDER — PHENYLEPHRINE HCL-NACL 20-0.9 MG/250ML-% IV SOLN
30.0000 ug/min | INTRAVENOUS | Status: AC
  Administered 2018-11-28: 12:00:00 20 ug/min via INTRAVENOUS
  Filled 2018-11-27: qty 250

## 2018-11-28 ENCOUNTER — Encounter (HOSPITAL_COMMUNITY)
Admission: RE | Disposition: A | Discharge status: Home / Self Care | Payer: Self-pay | Source: Home / Self Care | Attending: Thoracic Surgery (Cardiothoracic Vascular Surgery)

## 2018-11-28 ENCOUNTER — Inpatient Hospital Stay (HOSPITAL_COMMUNITY): Payer: 59 | Admitting: Physician Assistant

## 2018-11-28 ENCOUNTER — Other Ambulatory Visit: Payer: Self-pay

## 2018-11-28 ENCOUNTER — Inpatient Hospital Stay (HOSPITAL_COMMUNITY): Payer: 59

## 2018-11-28 ENCOUNTER — Encounter (HOSPITAL_COMMUNITY): Payer: Self-pay | Admitting: *Deleted

## 2018-11-28 ENCOUNTER — Inpatient Hospital Stay (HOSPITAL_COMMUNITY): Payer: 59 | Admitting: Certified Registered"

## 2018-11-28 ENCOUNTER — Inpatient Hospital Stay (HOSPITAL_COMMUNITY)
Admission: RE | Admit: 2018-11-28 | Discharge: 2018-12-03 | DRG: 220 | Disposition: A | Discharge status: Home / Self Care | Payer: 59 | Attending: Thoracic Surgery (Cardiothoracic Vascular Surgery) | Admitting: Thoracic Surgery (Cardiothoracic Vascular Surgery)

## 2018-11-28 DIAGNOSIS — K219 Gastro-esophageal reflux disease without esophagitis: Secondary | ICD-10-CM | POA: Diagnosis present

## 2018-11-28 DIAGNOSIS — N281 Cyst of kidney, acquired: Secondary | ICD-10-CM | POA: Diagnosis present

## 2018-11-28 DIAGNOSIS — I440 Atrioventricular block, first degree: Secondary | ICD-10-CM | POA: Diagnosis not present

## 2018-11-28 DIAGNOSIS — I340 Nonrheumatic mitral (valve) insufficiency: Secondary | ICD-10-CM

## 2018-11-28 DIAGNOSIS — B009 Herpesviral infection, unspecified: Secondary | ICD-10-CM | POA: Diagnosis present

## 2018-11-28 DIAGNOSIS — Z800 Family history of malignant neoplasm of digestive organs: Secondary | ICD-10-CM

## 2018-11-28 DIAGNOSIS — Z20828 Contact with and (suspected) exposure to other viral communicable diseases: Secondary | ICD-10-CM | POA: Diagnosis present

## 2018-11-28 DIAGNOSIS — Z8371 Family history of colonic polyps: Secondary | ICD-10-CM

## 2018-11-28 DIAGNOSIS — K7689 Other specified diseases of liver: Secondary | ICD-10-CM | POA: Diagnosis present

## 2018-11-28 DIAGNOSIS — E8770 Fluid overload, unspecified: Secondary | ICD-10-CM | POA: Diagnosis not present

## 2018-11-28 DIAGNOSIS — K5900 Constipation, unspecified: Secondary | ICD-10-CM | POA: Diagnosis present

## 2018-11-28 DIAGNOSIS — I341 Nonrheumatic mitral (valve) prolapse: Secondary | ICD-10-CM | POA: Diagnosis present

## 2018-11-28 DIAGNOSIS — J309 Allergic rhinitis, unspecified: Secondary | ICD-10-CM | POA: Diagnosis present

## 2018-11-28 DIAGNOSIS — I10 Essential (primary) hypertension: Secondary | ICD-10-CM | POA: Diagnosis present

## 2018-11-28 DIAGNOSIS — J90 Pleural effusion, not elsewhere classified: Secondary | ICD-10-CM | POA: Diagnosis not present

## 2018-11-28 DIAGNOSIS — Z4682 Encounter for fitting and adjustment of non-vascular catheter: Secondary | ICD-10-CM

## 2018-11-28 DIAGNOSIS — J9811 Atelectasis: Secondary | ICD-10-CM

## 2018-11-28 DIAGNOSIS — Z87891 Personal history of nicotine dependence: Secondary | ICD-10-CM | POA: Diagnosis not present

## 2018-11-28 DIAGNOSIS — Z86010 Personal history of colonic polyps: Secondary | ICD-10-CM

## 2018-11-28 DIAGNOSIS — Z79899 Other long term (current) drug therapy: Secondary | ICD-10-CM | POA: Diagnosis not present

## 2018-11-28 DIAGNOSIS — E785 Hyperlipidemia, unspecified: Secondary | ICD-10-CM | POA: Diagnosis present

## 2018-11-28 DIAGNOSIS — Z90710 Acquired absence of both cervix and uterus: Secondary | ICD-10-CM | POA: Diagnosis not present

## 2018-11-28 DIAGNOSIS — D62 Acute posthemorrhagic anemia: Secondary | ICD-10-CM | POA: Diagnosis not present

## 2018-11-28 DIAGNOSIS — Z9889 Other specified postprocedural states: Secondary | ICD-10-CM

## 2018-11-28 DIAGNOSIS — E039 Hypothyroidism, unspecified: Secondary | ICD-10-CM | POA: Diagnosis present

## 2018-11-28 HISTORY — PX: MITRAL VALVE REPAIR: SHX2039

## 2018-11-28 HISTORY — PX: TEE WITHOUT CARDIOVERSION: SHX5443

## 2018-11-28 HISTORY — DX: Other specified postprocedural states: Z98.890

## 2018-11-28 LAB — POCT I-STAT 4, (NA,K, GLUC, HGB,HCT)
Glucose, Bld: 98 mg/dL (ref 70–99)
Glucose, Bld: 116 mg/dL — ABNORMAL HIGH (ref 70–99)
Glucose, Bld: 152 mg/dL — ABNORMAL HIGH (ref 70–99)
Glucose, Bld: 135 mg/dL — ABNORMAL HIGH (ref 70–99)
HCT: 36 % (ref 36.0–46.0)
HCT: 36 % (ref 36.0–46.0)
HCT: 27 % — ABNORMAL LOW (ref 36.0–46.0)
HCT: 28 % — ABNORMAL LOW (ref 36.0–46.0)
Hemoglobin: 12.2 g/dL (ref 12.0–15.0)
Hemoglobin: 12.2 g/dL (ref 12.0–15.0)
Hemoglobin: 9.2 g/dL — ABNORMAL LOW (ref 12.0–15.0)
Hemoglobin: 9.5 g/dL — ABNORMAL LOW (ref 12.0–15.0)
Potassium: 4.1 mmol/L (ref 3.5–5.1)
Potassium: 3.8 mmol/L (ref 3.5–5.1)
Potassium: 4.6 mmol/L (ref 3.5–5.1)
Potassium: 4.1 mmol/L (ref 3.5–5.1)
Sodium: 139 mmol/L (ref 135–145)
Sodium: 138 mmol/L (ref 135–145)
Sodium: 136 mmol/L (ref 135–145)
Sodium: 141 mmol/L (ref 135–145)

## 2018-11-28 LAB — POCT I-STAT 7, (LYTES, BLD GAS, ICA,H+H)
Acid-base deficit: 5 mmol/L — ABNORMAL HIGH (ref 0.0–2.0)
Acid-base deficit: 5 mmol/L — ABNORMAL HIGH (ref 0.0–2.0)
Acid-base deficit: 5 mmol/L — ABNORMAL HIGH (ref 0.0–2.0)
Bicarbonate: 20.5 mmol/L (ref 20.0–28.0)
Bicarbonate: 21.2 mmol/L (ref 20.0–28.0)
Bicarbonate: 21 mmol/L (ref 20.0–28.0)
Calcium, Ion: 0.82 mmol/L — CL (ref 1.15–1.40)
Calcium, Ion: 1.08 mmol/L — ABNORMAL LOW (ref 1.15–1.40)
Calcium, Ion: 1.05 mmol/L — ABNORMAL LOW (ref 1.15–1.40)
HCT: 25 % — ABNORMAL LOW (ref 36.0–46.0)
HCT: 39 % (ref 36.0–46.0)
HCT: 33 % — ABNORMAL LOW (ref 36.0–46.0)
Hemoglobin: 8.5 g/dL — ABNORMAL LOW (ref 12.0–15.0)
Hemoglobin: 13.3 g/dL (ref 12.0–15.0)
Hemoglobin: 11.2 g/dL — ABNORMAL LOW (ref 12.0–15.0)
O2 Saturation: 100 %
O2 Saturation: 97 %
O2 Saturation: 98 %
Patient temperature: 36.4
Patient temperature: 36.7
Potassium: 3.7 mmol/L (ref 3.5–5.1)
Potassium: 4.4 mmol/L (ref 3.5–5.1)
Potassium: 4.4 mmol/L (ref 3.5–5.1)
Sodium: 129 mmol/L — ABNORMAL LOW (ref 135–145)
Sodium: 141 mmol/L (ref 135–145)
Sodium: 138 mmol/L (ref 135–145)
TCO2: 22 mmol/L (ref 22–32)
TCO2: 23 mmol/L (ref 22–32)
TCO2: 22 mmol/L (ref 22–32)
pCO2 arterial: 39.3 mmHg (ref 32.0–48.0)
pCO2 arterial: 41.8 mmHg (ref 32.0–48.0)
pCO2 arterial: 41.5 mmHg (ref 32.0–48.0)
pH, Arterial: 7.326 — ABNORMAL LOW (ref 7.350–7.450)
pH, Arterial: 7.311 — ABNORMAL LOW (ref 7.350–7.450)
pH, Arterial: 7.31 — ABNORMAL LOW (ref 7.350–7.450)
pO2, Arterial: 347 mmHg — ABNORMAL HIGH (ref 83.0–108.0)
pO2, Arterial: 99 mmHg (ref 83.0–108.0)
pO2, Arterial: 122 mmHg — ABNORMAL HIGH (ref 83.0–108.0)

## 2018-11-28 LAB — HEMOGLOBIN AND HEMATOCRIT, BLOOD
HCT: 27.9 % — ABNORMAL LOW (ref 36.0–46.0)
Hemoglobin: 9.1 g/dL — ABNORMAL LOW (ref 12.0–15.0)

## 2018-11-28 LAB — POCT I-STAT, CHEM 8
BUN: 6 mg/dL (ref 6–20)
Calcium, Ion: 1.06 mmol/L — ABNORMAL LOW (ref 1.15–1.40)
Chloride: 106 mmol/L (ref 98–111)
Creatinine, Ser: 0.7 mg/dL (ref 0.44–1.00)
Glucose, Bld: 141 mg/dL — ABNORMAL HIGH (ref 70–99)
HCT: 35 % — ABNORMAL LOW (ref 36.0–46.0)
Hemoglobin: 11.9 g/dL — ABNORMAL LOW (ref 12.0–15.0)
Potassium: 4.4 mmol/L (ref 3.5–5.1)
Sodium: 137 mmol/L (ref 135–145)
TCO2: 22 mmol/L (ref 22–32)

## 2018-11-28 LAB — GLUCOSE, CAPILLARY
Glucose-Capillary: 106 mg/dL — ABNORMAL HIGH (ref 70–99)
Glucose-Capillary: 107 mg/dL — ABNORMAL HIGH (ref 70–99)
Glucose-Capillary: 124 mg/dL — ABNORMAL HIGH (ref 70–99)
Glucose-Capillary: 126 mg/dL — ABNORMAL HIGH (ref 70–99)
Glucose-Capillary: 128 mg/dL — ABNORMAL HIGH (ref 70–99)
Glucose-Capillary: 134 mg/dL — ABNORMAL HIGH (ref 70–99)
Glucose-Capillary: 93 mg/dL (ref 70–99)
Glucose-Capillary: 96 mg/dL (ref 70–99)
Glucose-Capillary: 98 mg/dL (ref 70–99)

## 2018-11-28 LAB — CREATININE, SERUM
Creatinine, Ser: 0.72 mg/dL (ref 0.44–1.00)
GFR calc Af Amer: >60 mL/min (ref >60)
GFR calc non Af Amer: >60 mL/min (ref >60)

## 2018-11-28 LAB — PROTIME-INR
INR: 1.4 — ABNORMAL HIGH (ref 0.8–1.2)
Prothrombin Time: 16.8 seconds — ABNORMAL HIGH (ref 11.4–15.2)

## 2018-11-28 LAB — URINE CULTURE: Culture: NO GROWTH

## 2018-11-28 LAB — CBC
HCT: 37.9 % (ref 36.0–46.0)
HCT: 32.8 % — ABNORMAL LOW (ref 36.0–46.0)
Hemoglobin: 12.2 g/dL (ref 12.0–15.0)
Hemoglobin: 10.9 g/dL — ABNORMAL LOW (ref 12.0–15.0)
MCH: 29.6 pg (ref 26.0–34.0)
MCH: 30.5 pg (ref 26.0–34.0)
MCHC: 32.2 g/dL (ref 30.0–36.0)
MCHC: 33.2 g/dL (ref 30.0–36.0)
MCV: 92 fL (ref 80.0–100.0)
MCV: 91.9 fL (ref 80.0–100.0)
Platelets: 173 10*3/uL (ref 150–400)
Platelets: 161 10*3/uL (ref 150–400)
RBC: 4.12 MIL/uL (ref 3.87–5.11)
RBC: 3.57 MIL/uL — ABNORMAL LOW (ref 3.87–5.11)
RDW: 12.9 % (ref 11.5–15.5)
RDW: 13.1 % (ref 11.5–15.5)
WBC: 21.4 10*3/uL — ABNORMAL HIGH (ref 4.0–10.5)
WBC: 16.3 10*3/uL — ABNORMAL HIGH (ref 4.0–10.5)
nRBC: 0 % (ref 0.0–0.2)
nRBC: 0 % (ref 0.0–0.2)

## 2018-11-28 LAB — PLATELET COUNT: Platelets: 194 10*3/uL (ref 150–400)

## 2018-11-28 LAB — APTT: aPTT: 30 seconds (ref 24–36)

## 2018-11-28 LAB — MAGNESIUM: Magnesium: 3.2 mg/dL — ABNORMAL HIGH (ref 1.7–2.4)

## 2018-11-28 SURGERY — REPAIR, MITRAL VALVE, MINIMALLY INVASIVE
Anesthesia: General | Site: Chest | Laterality: Right

## 2018-11-28 MED ORDER — OXYCODONE HCL 5 MG PO TABS
5.0000 mg | ORAL_TABLET | ORAL | Status: DC | PRN
  Administered 2018-11-28 – 2018-11-30 (×8): 10 mg via ORAL
  Administered 2018-11-30: 13:00:00 5 mg via ORAL
  Administered 2018-11-30 – 2018-12-03 (×16): 10 mg via ORAL
  Filled 2018-11-28 (×26): qty 2

## 2018-11-28 MED ORDER — CHLORHEXIDINE GLUCONATE CLOTH 2 % EX PADS
6.0000 | MEDICATED_PAD | Freq: Every day | CUTANEOUS | Status: DC
  Administered 2018-11-28 – 2018-12-02 (×4): 6 via TOPICAL

## 2018-11-28 MED ORDER — POTASSIUM CHLORIDE 10 MEQ/50ML IV SOLN: 10.0000 meq | INTRAVENOUS | Status: AC

## 2018-11-28 MED ORDER — HEPARIN SODIUM (PORCINE) 1000 UNIT/ML IJ SOLN
INTRAMUSCULAR | Status: AC
  Filled 2018-11-28: qty 1

## 2018-11-28 MED ORDER — METOPROLOL TARTRATE 12.5 MG HALF TABLET
12.5000 mg | ORAL_TABLET | Freq: Once | ORAL | Status: AC
  Administered 2018-11-28: 06:00:00 12.5 mg via ORAL
  Filled 2018-11-28: qty 1

## 2018-11-28 MED ORDER — SUGAMMADEX SODIUM 200 MG/2ML IV SOLN
INTRAVENOUS | Status: DC | PRN
  Administered 2018-11-28: 200 mg via INTRAVENOUS

## 2018-11-28 MED ORDER — DEXAMETHASONE SODIUM PHOSPHATE 10 MG/ML IJ SOLN
INTRAMUSCULAR | Status: AC
  Filled 2018-11-28: qty 1

## 2018-11-28 MED ORDER — BISACODYL 5 MG PO TBEC
10.0000 mg | DELAYED_RELEASE_TABLET | Freq: Every day | ORAL | Status: DC
  Administered 2018-11-29 – 2018-12-03 (×5): 10 mg via ORAL
  Filled 2018-11-28 (×5): qty 2

## 2018-11-28 MED ORDER — ONDANSETRON HCL 4 MG/2ML IJ SOLN
INTRAMUSCULAR | Status: DC | PRN
  Administered 2018-11-28: 4 mg via INTRAVENOUS

## 2018-11-28 MED ORDER — PHENYLEPHRINE 40 MCG/ML (10ML) SYRINGE FOR IV PUSH (FOR BLOOD PRESSURE SUPPORT)
PREFILLED_SYRINGE | INTRAVENOUS | Status: AC
  Filled 2018-11-28: qty 10

## 2018-11-28 MED ORDER — DOCUSATE SODIUM 100 MG PO CAPS
200.0000 mg | ORAL_CAPSULE | Freq: Every day | ORAL | Status: DC
  Administered 2018-11-29 – 2018-12-03 (×5): 200 mg via ORAL
  Filled 2018-11-28 (×5): qty 2

## 2018-11-28 MED ORDER — ESMOLOL HCL 100 MG/10ML IV SOLN
INTRAVENOUS | Status: DC | PRN
  Administered 2018-11-28: 40 mg via INTRAVENOUS
  Administered 2018-11-28: 50 mg via INTRAVENOUS
  Administered 2018-11-28: 10 mg via INTRAVENOUS

## 2018-11-28 MED ORDER — MIDAZOLAM HCL (PF) 10 MG/2ML IJ SOLN
INTRAMUSCULAR | Status: AC
  Filled 2018-11-28: qty 2

## 2018-11-28 MED ORDER — ACETAMINOPHEN 650 MG RE SUPP
650.0000 mg | Freq: Once | RECTAL | Status: AC
  Administered 2018-11-28: 15:00:00 650 mg via RECTAL

## 2018-11-28 MED ORDER — SODIUM CHLORIDE 0.9 % IV SOLN
INTRAVENOUS | Status: DC
  Administered 2018-11-28: 14:00:00 via INTRAVENOUS

## 2018-11-28 MED ORDER — LACTATED RINGERS IV SOLN: INTRAVENOUS | Status: DC

## 2018-11-28 MED ORDER — SODIUM CHLORIDE 0.9% FLUSH: 3.0000 mL | INTRAVENOUS | Status: DC | PRN

## 2018-11-28 MED ORDER — ONDANSETRON HCL 4 MG/2ML IJ SOLN
INTRAMUSCULAR | Status: AC
  Filled 2018-11-28: qty 2

## 2018-11-28 MED ORDER — SODIUM CHLORIDE 0.9 % IV SOLN: 250.0000 mL | INTRAVENOUS | Status: DC

## 2018-11-28 MED ORDER — SODIUM CHLORIDE 0.9 % IR SOLN
Status: DC | PRN
  Administered 2018-11-28: 3000 mL

## 2018-11-28 MED ORDER — PROPOFOL 10 MG/ML IV BOLUS
INTRAVENOUS | Status: AC
  Filled 2018-11-28: qty 20

## 2018-11-28 MED ORDER — SODIUM CHLORIDE 0.9 % IV SOLN
INTRAVENOUS | Status: DC | PRN
  Administered 2018-11-28: 07:00:00 via INTRAVENOUS

## 2018-11-28 MED ORDER — CHLORHEXIDINE GLUCONATE 4 % EX LIQD: 30.0000 mL | CUTANEOUS | Status: DC

## 2018-11-28 MED ORDER — TRAMADOL HCL 50 MG PO TABS
50.0000 mg | ORAL_TABLET | ORAL | Status: DC | PRN
  Administered 2018-11-28: 20:00:00 50 mg via ORAL
  Administered 2018-11-29: 04:00:00 100 mg via ORAL
  Filled 2018-11-28: qty 2
  Filled 2018-11-28: qty 1

## 2018-11-28 MED ORDER — PHENYLEPHRINE HCL (PRESSORS) 10 MG/ML IV SOLN
INTRAVENOUS | Status: DC | PRN
  Administered 2018-11-28: 40 ug via INTRAVENOUS
  Administered 2018-11-28: 80 ug via INTRAVENOUS

## 2018-11-28 MED ORDER — SODIUM CHLORIDE 0.9 % IV SOLN
1.5000 g | Freq: Two times a day (BID) | INTRAVENOUS | Status: AC
  Administered 2018-11-28 – 2018-11-30 (×4): 1.5 g via INTRAVENOUS
  Filled 2018-11-28 (×8): qty 1.5

## 2018-11-28 MED ORDER — PANTOPRAZOLE SODIUM 40 MG PO TBEC
40.0000 mg | DELAYED_RELEASE_TABLET | Freq: Every day | ORAL | Status: DC
  Administered 2018-11-30 – 2018-12-03 (×4): 40 mg via ORAL
  Filled 2018-11-28 (×4): qty 1

## 2018-11-28 MED ORDER — INSULIN REGULAR BOLUS VIA INFUSION
0.0000 [IU] | Freq: Three times a day (TID) | INTRAVENOUS | Status: DC
  Filled 2018-11-28: qty 10

## 2018-11-28 MED ORDER — FENTANYL CITRATE (PF) 250 MCG/5ML IJ SOLN
INTRAMUSCULAR | Status: DC | PRN
  Administered 2018-11-28: 350 ug via INTRAVENOUS
  Administered 2018-11-28: 100 ug via INTRAVENOUS
  Administered 2018-11-28 (×2): 75 ug via INTRAVENOUS
  Administered 2018-11-28: 50 ug via INTRAVENOUS
  Administered 2018-11-28: 100 ug via INTRAVENOUS

## 2018-11-28 MED ORDER — FENTANYL CITRATE (PF) 250 MCG/5ML IJ SOLN
INTRAMUSCULAR | Status: AC
  Filled 2018-11-28: qty 20

## 2018-11-28 MED ORDER — MIDAZOLAM HCL 5 MG/5ML IJ SOLN
INTRAMUSCULAR | Status: DC | PRN
  Administered 2018-11-28: 1 mg via INTRAVENOUS
  Administered 2018-11-28: 2 mg via INTRAVENOUS
  Administered 2018-11-28: 1 mg via INTRAVENOUS
  Administered 2018-11-28: 2 mg via INTRAVENOUS

## 2018-11-28 MED ORDER — SODIUM CHLORIDE 0.45 % IV SOLN
INTRAVENOUS | Status: DC | PRN
  Administered 2018-11-28: 15:00:00 via INTRAVENOUS
  Administered 2018-11-29: 1 mL via INTRAVENOUS

## 2018-11-28 MED ORDER — FAMOTIDINE IN NACL 20-0.9 MG/50ML-% IV SOLN
20.0000 mg | Freq: Two times a day (BID) | INTRAVENOUS | Status: DC
  Administered 2018-11-28: 15:00:00 20 mg via INTRAVENOUS

## 2018-11-28 MED ORDER — METOPROLOL TARTRATE 5 MG/5ML IV SOLN: 2.5000 mg | INTRAVENOUS | Status: DC | PRN

## 2018-11-28 MED ORDER — PHENYLEPHRINE HCL-NACL 20-0.9 MG/250ML-% IV SOLN: 0.0000 ug/min | INTRAVENOUS | Status: DC

## 2018-11-28 MED ORDER — ACETAMINOPHEN 10 MG/ML IV SOLN
INTRAVENOUS | Status: AC
  Filled 2018-11-28: qty 100

## 2018-11-28 MED ORDER — PROPOFOL 10 MG/ML IV BOLUS
INTRAVENOUS | Status: DC | PRN
  Administered 2018-11-28: 100 mg via INTRAVENOUS
  Administered 2018-11-28: 40 mg via INTRAVENOUS

## 2018-11-28 MED ORDER — BISACODYL 10 MG RE SUPP: 10.0000 mg | Freq: Every day | RECTAL | Status: DC

## 2018-11-28 MED ORDER — ROCURONIUM BROMIDE 10 MG/ML (PF) SYRINGE
PREFILLED_SYRINGE | INTRAVENOUS | Status: AC
  Filled 2018-11-28: qty 30

## 2018-11-28 MED ORDER — METOPROLOL TARTRATE 12.5 MG HALF TABLET
12.5000 mg | ORAL_TABLET | Freq: Two times a day (BID) | ORAL | Status: DC
  Administered 2018-11-28 – 2018-11-29 (×3): 12.5 mg via ORAL
  Filled 2018-11-28 (×3): qty 1

## 2018-11-28 MED ORDER — FENTANYL CITRATE (PF) 250 MCG/5ML IJ SOLN
INTRAMUSCULAR | Status: AC
  Filled 2018-11-28: qty 5

## 2018-11-28 MED ORDER — INSULIN ASPART 100 UNIT/ML ~~LOC~~ SOLN
0.0000 [IU] | SUBCUTANEOUS | Status: DC
  Administered 2018-11-29: 05:00:00 2 [IU] via SUBCUTANEOUS

## 2018-11-28 MED ORDER — ONDANSETRON HCL 4 MG/2ML IJ SOLN: 4.0000 mg | Freq: Four times a day (QID) | INTRAMUSCULAR | Status: DC | PRN

## 2018-11-28 MED ORDER — SODIUM CHLORIDE 0.9 % IV SOLN
INTRAVENOUS | Status: AC
  Administered 2018-11-28: 14:00:00 via INTRAVENOUS

## 2018-11-28 MED ORDER — ALBUMIN HUMAN 5 % IV SOLN: 250.0000 mL | INTRAVENOUS | Status: DC | PRN

## 2018-11-28 MED ORDER — KETOROLAC TROMETHAMINE 15 MG/ML IJ SOLN
15.0000 mg | Freq: Four times a day (QID) | INTRAMUSCULAR | Status: AC
  Administered 2018-11-28 (×2): 15 mg via INTRAVENOUS
  Filled 2018-11-28 (×2): qty 1

## 2018-11-28 MED ORDER — ACETAMINOPHEN 160 MG/5ML PO SOLN: 650.0000 mg | Freq: Once | ORAL | Status: AC

## 2018-11-28 MED ORDER — MORPHINE SULFATE (PF) 2 MG/ML IV SOLN
1.0000 mg | INTRAVENOUS | Status: DC | PRN
  Administered 2018-11-28 – 2018-11-30 (×8): 2 mg via INTRAVENOUS
  Administered 2018-12-01 (×2): 4 mg via INTRAVENOUS
  Filled 2018-11-28: qty 1
  Filled 2018-11-28: qty 2
  Filled 2018-11-28 (×4): qty 1
  Filled 2018-11-28: qty 2
  Filled 2018-11-28 (×2): qty 1

## 2018-11-28 MED ORDER — LACTATED RINGERS IV SOLN
INTRAVENOUS | Status: DC | PRN
  Administered 2018-11-28: 07:00:00 via INTRAVENOUS

## 2018-11-28 MED ORDER — SODIUM CHLORIDE 0.9% FLUSH
3.0000 mL | Freq: Two times a day (BID) | INTRAVENOUS | Status: DC
  Administered 2018-11-29 – 2018-12-02 (×7): 3 mL via INTRAVENOUS

## 2018-11-28 MED ORDER — MORPHINE SULFATE (PF) 2 MG/ML IV SOLN
INTRAVENOUS | Status: AC
  Administered 2018-11-28: 14:00:00 2 mg via INTRAVENOUS
  Filled 2018-11-28: qty 1

## 2018-11-28 MED ORDER — LACTATED RINGERS IV SOLN: 500.0000 mL | Freq: Once | INTRAVENOUS | Status: DC | PRN

## 2018-11-28 MED ORDER — NITROGLYCERIN IN D5W 200-5 MCG/ML-% IV SOLN: 0.0000 ug/min | INTRAVENOUS | Status: DC

## 2018-11-28 MED ORDER — ASPIRIN 81 MG PO CHEW: 324.0000 mg | CHEWABLE_TABLET | Freq: Every day | ORAL | Status: DC

## 2018-11-28 MED ORDER — PROTAMINE SULFATE 10 MG/ML IV SOLN
INTRAVENOUS | Status: AC
  Filled 2018-11-28: qty 25

## 2018-11-28 MED ORDER — MAGNESIUM SULFATE 4 GM/100ML IV SOLN
4.0000 g | Freq: Once | INTRAVENOUS | Status: AC
  Administered 2018-11-28: 14:00:00 4 g via INTRAVENOUS
  Filled 2018-11-28: qty 100

## 2018-11-28 MED ORDER — ALBUMIN HUMAN 5 % IV SOLN
INTRAVENOUS | Status: DC | PRN
  Administered 2018-11-28: 12:00:00 via INTRAVENOUS

## 2018-11-28 MED ORDER — 0.9 % SODIUM CHLORIDE (POUR BTL) OPTIME
TOPICAL | Status: DC | PRN
  Administered 2018-11-28: 5000 mL

## 2018-11-28 MED ORDER — MUPIROCIN 2 % EX OINT: 1.0000 "application " | TOPICAL_OINTMENT | Freq: Two times a day (BID) | CUTANEOUS | Status: DC

## 2018-11-28 MED ORDER — BUPIVACAINE HCL (PF) 0.5 % IJ SOLN
INTRAMUSCULAR | Status: DC | PRN
  Administered 2018-11-28: 10 mL

## 2018-11-28 MED ORDER — MIDAZOLAM HCL 2 MG/2ML IJ SOLN: 2.0000 mg | INTRAMUSCULAR | Status: DC | PRN

## 2018-11-28 MED ORDER — ACETAMINOPHEN 500 MG PO TABS
1000.0000 mg | ORAL_TABLET | Freq: Four times a day (QID) | ORAL | Status: DC
  Administered 2018-11-28 – 2018-12-03 (×19): 1000 mg via ORAL
  Filled 2018-11-28 (×19): qty 2

## 2018-11-28 MED ORDER — DEXAMETHASONE SODIUM PHOSPHATE 10 MG/ML IJ SOLN
INTRAMUSCULAR | Status: DC | PRN
  Administered 2018-11-28: 4 mg via INTRAVENOUS

## 2018-11-28 MED ORDER — DEXMEDETOMIDINE HCL IN NACL 200 MCG/50ML IV SOLN: 0.0000 ug/kg/h | INTRAVENOUS | Status: DC

## 2018-11-28 MED ORDER — CHLORHEXIDINE GLUCONATE 0.12 % MT SOLN
15.0000 mL | Freq: Once | OROMUCOSAL | Status: AC
  Administered 2018-11-28: 06:00:00 15 mL via OROMUCOSAL
  Filled 2018-11-28: qty 15

## 2018-11-28 MED ORDER — BUPIVACAINE HCL (PF) 0.5 % IJ SOLN
INTRAMUSCULAR | Status: AC
  Filled 2018-11-28: qty 10

## 2018-11-28 MED ORDER — ROCURONIUM BROMIDE 10 MG/ML (PF) SYRINGE
PREFILLED_SYRINGE | INTRAVENOUS | Status: DC | PRN
  Administered 2018-11-28: 30 mg via INTRAVENOUS
  Administered 2018-11-28: 10 mg via INTRAVENOUS
  Administered 2018-11-28: 15 mg via INTRAVENOUS
  Administered 2018-11-28: 100 mg via INTRAVENOUS

## 2018-11-28 MED ORDER — ACETAMINOPHEN 160 MG/5ML PO SOLN: 1000.0000 mg | Freq: Four times a day (QID) | ORAL | Status: DC

## 2018-11-28 MED ORDER — VANCOMYCIN HCL IN DEXTROSE 1-5 GM/200ML-% IV SOLN
1000.0000 mg | Freq: Once | INTRAVENOUS | Status: AC
  Administered 2018-11-28: 20:00:00 1000 mg via INTRAVENOUS
  Filled 2018-11-28: qty 200

## 2018-11-28 MED ORDER — BUPIVACAINE 0.5 % ON-Q PUMP SINGLE CATH 400 ML
400.0000 mL | INJECTION | Status: DC
  Filled 2018-11-28 (×2): qty 400

## 2018-11-28 MED ORDER — METOPROLOL TARTRATE 25 MG/10 ML ORAL SUSPENSION: 12.5000 mg | Freq: Two times a day (BID) | ORAL | Status: DC

## 2018-11-28 MED ORDER — CHLORHEXIDINE GLUCONATE 0.12 % MT SOLN: 15.0000 mL | OROMUCOSAL | Status: AC

## 2018-11-28 MED ORDER — ASPIRIN EC 325 MG PO TBEC
325.0000 mg | DELAYED_RELEASE_TABLET | Freq: Every day | ORAL | Status: DC
  Administered 2018-11-29: 09:00:00 325 mg via ORAL
  Filled 2018-11-28: qty 1

## 2018-11-28 MED ORDER — INSULIN REGULAR(HUMAN) IN NACL 100-0.9 UT/100ML-% IV SOLN: INTRAVENOUS | Status: DC

## 2018-11-28 MED ORDER — SODIUM CHLORIDE 0.9 % IV SOLN
INTRAVENOUS | Status: AC
  Filled 2018-11-28: qty 1.2

## 2018-11-28 MED ORDER — HEPARIN SODIUM (PORCINE) 1000 UNIT/ML IJ SOLN
INTRAMUSCULAR | Status: DC | PRN
  Administered 2018-11-28: 26000 [IU] via INTRAVENOUS

## 2018-11-28 MED ORDER — PROTAMINE SULFATE 10 MG/ML IV SOLN
INTRAVENOUS | Status: DC | PRN
  Administered 2018-11-28: 260 mg via INTRAVENOUS

## 2018-11-28 MED ORDER — SODIUM CHLORIDE 0.9 % IV SOLN
INTRAVENOUS | Status: DC | PRN
  Administered 2018-11-28: 09:00:00

## 2018-11-28 SURGICAL SUPPLY — 108 items
ADAPTER CARDIO PERF ANTE/RETRO (ADAPTER) ×3 IMPLANT
BAG DECANTER FOR FLEXI CONT (MISCELLANEOUS) ×6 IMPLANT
BLADE STERNUM SYSTEM 6 (BLADE) ×3 IMPLANT
BLADE SURG 11 STRL SS (BLADE) ×3 IMPLANT
CABLE PACING FASLOC BIEGE (MISCELLANEOUS) ×3 IMPLANT
CANISTER SUCT 3000ML PPV (MISCELLANEOUS) ×6 IMPLANT
CANNULA FEM VENOUS REMOTE 22FR (CANNULA) ×3 IMPLANT
CANNULA FEMORAL ART 14 SM (MISCELLANEOUS) ×3 IMPLANT
CANNULA GUNDRY RCSP 15FR (MISCELLANEOUS) ×3 IMPLANT
CANNULA OPTISITE PERFUSION 16F (CANNULA) IMPLANT
CANNULA OPTISITE PERFUSION 18F (CANNULA) ×3 IMPLANT
CANNULA SUMP PERICARDIAL (CANNULA) ×6 IMPLANT
CATH CPB KIT OWEN (MISCELLANEOUS) IMPLANT
CATH KIT ON Q 5IN SLV (PAIN MANAGEMENT) IMPLANT
CATH KIT ON-Q SILVERSOAK 5IN (CATHETERS) ×3 IMPLANT
CELLS DAT CNTRL 66122 CELL SVR (MISCELLANEOUS) ×2 IMPLANT
CONN ST 1/4X3/8  BEN (MISCELLANEOUS) ×2
CONN ST 1/4X3/8 BEN (MISCELLANEOUS) ×4 IMPLANT
CONNECTOR 1/2X3/8X1/2 3 WAY (MISCELLANEOUS) ×1
CONNECTOR 1/2X3/8X1/2 3WAY (MISCELLANEOUS) ×2 IMPLANT
CONT SPEC 4OZ CLIKSEAL STRL BL (MISCELLANEOUS) ×3 IMPLANT
COVER BACK TABLE 24X17X13 BIG (DRAPES) ×3 IMPLANT
COVER PROBE W GEL 5X96 (DRAPES) ×3 IMPLANT
COVER WAND RF STERILE (DRAPES) ×3 IMPLANT
CRADLE DONUT ADULT HEAD (MISCELLANEOUS) ×3 IMPLANT
DECANTER SPIKE VIAL GLASS SM (MISCELLANEOUS) ×3 IMPLANT
DERMABOND ADVANCED (GAUZE/BANDAGES/DRESSINGS) ×2
DERMABOND ADVANCED .7 DNX12 (GAUZE/BANDAGES/DRESSINGS) ×4 IMPLANT
DEVICE CLOSURE PERCLS PRGLD 6F (VASCULAR PRODUCTS) ×8 IMPLANT
DEVICE PMI PUNCTURE CLOSURE (MISCELLANEOUS) ×3 IMPLANT
DEVICE SUT CK QUICK LOAD INDV (Prosthesis & Implant Heart) ×9 IMPLANT
DEVICE SUT CK QUICK LOAD MINI (Prosthesis & Implant Heart) ×18 IMPLANT
DEVICE TROCAR PUNCTURE CLOSURE (ENDOMECHANICALS) ×3 IMPLANT
DRAIN CHANNEL 28F RND 3/8 FF (WOUND CARE) ×6 IMPLANT
DRAPE BILATERAL SPLIT (DRAPES) ×3 IMPLANT
DRAPE C-ARM 42X72 X-RAY (DRAPES) ×3 IMPLANT
DRAPE CV SPLIT W-CLR ANES SCRN (DRAPES) ×3 IMPLANT
DRAPE INCISE IOBAN 66X45 STRL (DRAPES) ×12 IMPLANT
DRAPE SLUSH/WARMER DISC (DRAPES) ×3 IMPLANT
DRSG AQUACEL AG ADV 3.5X10 (GAUZE/BANDAGES/DRESSINGS) ×3 IMPLANT
DRSG COVADERM 4X8 (GAUZE/BANDAGES/DRESSINGS) ×3 IMPLANT
ELECT BLADE 6.5 EXT (BLADE) ×3 IMPLANT
ELECT REM PT RETURN 9FT ADLT (ELECTROSURGICAL) ×6
ELECTRODE REM PT RTRN 9FT ADLT (ELECTROSURGICAL) ×4 IMPLANT
FELT TEFLON 1X6 (MISCELLANEOUS) ×6 IMPLANT
FEMORAL VENOUS CANN RAP (CANNULA) IMPLANT
GAUZE SPONGE 4X4 12PLY STRL LF (GAUZE/BANDAGES/DRESSINGS) ×3 IMPLANT
GLOVE ORTHO TXT STRL SZ7.5 (GLOVE) ×9 IMPLANT
GLOVE SURG SS PI 6.0 STRL IVOR (GLOVE) ×6 IMPLANT
GOWN STRL REUS W/ TWL LRG LVL3 (GOWN DISPOSABLE) ×16 IMPLANT
GOWN STRL REUS W/TWL LRG LVL3 (GOWN DISPOSABLE) ×8
IV NS IRRIG 3000ML ARTHROMATIC (IV SOLUTION) ×3 IMPLANT
IV SOD CHL 0.9% 1000ML (IV SOLUTION) ×3 IMPLANT
KIT BASIN OR (CUSTOM PROCEDURE TRAY) ×3 IMPLANT
KIT DILATOR VASC 18G NDL (KITS) ×6 IMPLANT
KIT DRAINAGE VACCUM ASSIST (KITS) ×3 IMPLANT
KIT SUCTION CATH 14FR (SUCTIONS) ×3 IMPLANT
KIT SUT CK MINI COMBO 4X17 (Prosthesis & Implant Heart) ×36 IMPLANT
KIT TURNOVER KIT B (KITS) ×3 IMPLANT
LEAD PACING MYOCARDI (MISCELLANEOUS) ×3 IMPLANT
LINE VENT (MISCELLANEOUS) ×3 IMPLANT
NEEDLE AORTIC ROOT 14G 7F (CATHETERS) ×3 IMPLANT
NS IRRIG 1000ML POUR BTL (IV SOLUTION) ×15 IMPLANT
PACK E MIN INVASIVE VALVE (SUTURE) ×3 IMPLANT
PACK OPEN HEART (CUSTOM PROCEDURE TRAY) ×3 IMPLANT
PAD ARMBOARD 7.5X6 YLW CONV (MISCELLANEOUS) ×6 IMPLANT
PAD ELECT DEFIB RADIOL ZOLL (MISCELLANEOUS) ×3 IMPLANT
PERCLOSE PROGLIDE 6F (VASCULAR PRODUCTS) ×12
RING MITRAL MEMO 4D 32 (Prosthesis & Implant Heart) ×3 IMPLANT
RTRCTR WOUND ALEXIS 18CM MED (MISCELLANEOUS) ×3
SET CANNULATION TOURNIQUET (MISCELLANEOUS) ×3 IMPLANT
SET CARDIOPLEGIA MPS 5001102 (MISCELLANEOUS) ×3 IMPLANT
SET IRRIG TUBING LAPAROSCOPIC (IRRIGATION / IRRIGATOR) ×3 IMPLANT
SET MICROPUNCTURE 5F STIFF (MISCELLANEOUS) ×3 IMPLANT
SHEATH PINNACLE 8F 10CM (SHEATH) ×9 IMPLANT
SOLUTION ANTI FOG 6CC (MISCELLANEOUS) ×3 IMPLANT
STOPCOCK 4 WAY LG BORE MALE ST (IV SETS) ×3 IMPLANT
SUT BONE WAX W31G (SUTURE) ×3 IMPLANT
SUT ETHIBOND (SUTURE) ×6 IMPLANT
SUT ETHIBOND 2 0 SH (SUTURE) ×3 IMPLANT
SUT ETHIBOND 2-0 RB-1 WHT (SUTURE) ×3 IMPLANT
SUT ETHIBOND X763 2 0 SH 1 (SUTURE) ×3 IMPLANT
SUT GORETEX CV 4 TH 22 36 (SUTURE) ×3 IMPLANT
SUT GORETEX CV4 TH-18 (SUTURE) ×6 IMPLANT
SUT PROLENE 3 0 SH1 36 (SUTURE) ×15 IMPLANT
SUT PROLENE 6 0 C 1 30 (SUTURE) ×6 IMPLANT
SUT SILK  1 MH (SUTURE) ×1
SUT SILK 1 MH (SUTURE) ×2 IMPLANT
SUT SILK 2 0 SH CR/8 (SUTURE) IMPLANT
SUT SILK 3 0 SH CR/8 (SUTURE) IMPLANT
SUT VIC AB 2-0 CTX 36 (SUTURE) IMPLANT
SUT VIC AB 3-0 SH 8-18 (SUTURE) IMPLANT
SUT VICRYL 2 TP 1 (SUTURE) IMPLANT
SYR 10ML LL (SYRINGE) ×3 IMPLANT
SYSTEM SAHARA CHEST DRAIN ATS (WOUND CARE) ×3 IMPLANT
TAPE CLOTH SURG 4X10 WHT LF (GAUZE/BANDAGES/DRESSINGS) ×3 IMPLANT
TAPE PAPER 2X10 WHT MICROPORE (GAUZE/BANDAGES/DRESSINGS) ×3 IMPLANT
TOWEL GREEN STERILE (TOWEL DISPOSABLE) ×3 IMPLANT
TOWEL GREEN STERILE FF (TOWEL DISPOSABLE) ×3 IMPLANT
TRAY FOLEY SLVR 16FR TEMP STAT (SET/KITS/TRAYS/PACK) ×3 IMPLANT
TROCAR XCEL BLADELESS 5X75MML (TROCAR) ×3 IMPLANT
TROCAR XCEL NON-BLD 11X100MML (ENDOMECHANICALS) ×6 IMPLANT
TUBE SUCT INTRACARD DLP 20F (MISCELLANEOUS) ×3 IMPLANT
TUNNELER SHEATH ON-Q 11GX8 DSP (PAIN MANAGEMENT) ×3 IMPLANT
UNDERPAD 30X30 (UNDERPADS AND DIAPERS) ×3 IMPLANT
WATER STERILE IRR 1000ML POUR (IV SOLUTION) ×6 IMPLANT
WIRE .035 3MM-J 145CM (WIRE) ×3 IMPLANT
WIRE EMERALD 3MM-J .035X150CM (WIRE) ×3 IMPLANT

## 2018-11-28 NOTE — Transfer of Care (Signed)
Immediate Anesthesia Transfer of Care Note  Patient: [name]  Procedure(s) Performed: MINIMALLY INVASIVE MITRAL VALVE REPAIR (MVR) - Using Memo 4D Ring Size 32 (Right Chest) TRANSESOPHAGEAL ECHOCARDIOGRAM (TEE) (N/A )  Patient Location: PACU  Anesthesia Type:General  Level of Consciousness: awake and drowsy  Airway & Oxygen Therapy: Patient Spontanous Breathing and Patient connected to nasal cannula oxygen  Post-op Assessment: Report given to RN, Post -op Vital signs reviewed and stable and Patient moving all extremities X 4  Post vital signs: Reviewed and stable  Last Vitals:  Vitals Value Taken Time  BP 113/87 11/28/2018  2:06 PM  Temp 36.4 C 11/28/2018  2:12 PM  Pulse 94 11/28/2018  2:12 PM  Resp 17 11/28/2018  2:12 PM  SpO2 100 % 11/28/2018  2:12 PM  Vitals shown include unvalidated device data.  Last Pain:  Vitals:   11/28/18 0607  TempSrc:   PainSc: 0-No pain         Complications: No apparent anesthesia complications

## 2018-11-28 NOTE — Anesthesia Procedure Notes (Signed)
Arterial Line Insertion Start/End5/27/2020 6:55 AM, 11/28/2018 6:59 AM Performed by: Inda Coke, CRNA, CRNA  Patient location: Pre-op. Preanesthetic checklist: patient identified, IV checked, site marked, risks and benefits discussed, surgical consent, monitors and equipment checked, pre-op evaluation, timeout performed and anesthesia consent Lidocaine 1% used for infiltration Left, radial was placed Catheter size: 20 G Hand hygiene performed  and maximum sterile barriers used  Allen's test indicative of satisfactory collateral circulation Attempts: 1 Procedure performed without using ultrasound guided technique. Ultrasound Notes:anatomy identified Following insertion, dressing applied and Biopatch. Post procedure assessment: normal  Patient tolerated the procedure well with no immediate complications.

## 2018-11-28 NOTE — Progress Notes (Signed)
CT Surgery PM Rounds  Extubated after MV repair Nsr, breathing comfortably Labs ok C/o pain Will add short course iv toradol

## 2018-11-28 NOTE — Op Note (Signed)
CARDIOTHORACIC SURGERY OPERATIVE NOTE  Date of Procedure:  11/28/2018  Preoperative Diagnosis:   Mitral Valve Prolapse with Symptomatic Primary Mitral Regurgitation  Postoperative Diagnosis: Same  Procedure:    Minimally-Invasive Mitral Valve Repair  [name] Ring Annuloplasty (size 16mm, catalog # [id number], serial # [id number])    Surgeon: [name] Gu. Roxy Manns, MD  Assistant: Nicholes Rough, PA-C  Anesthesia: Laurie Panda, MD  Operative Findings:  Severe annular dilitation of the mitral valve  Bileaflet billowing with mild prolapse but no ruptured chordae tendinae nor flail segments  Primarily type I leaflet dysfunction with mild type II dysfunction  Mild left ventricular systolic dysfunction  No residual mitral regurgitation after successful valve repair           BRIEF CLINICAL NOTE AND INDICATIONS FOR SURGERY  Patient is anotherwise healthy 43 year old African-American female with known history of mitral valve prolapse with mitral regurgitation who has been referred for surgical consultation.  I had the opportunity to evaluate the patient in consultation in 2013. Transesophageal echocardiogram revealed bileaflet prolapse of mitral valve with moderate mitral regurgitation. Left ventricular size and systolic function was normal. At that time she was entirely asymptomatic. Continued medical therapy with close follow-up was recommended. Over the years she has been followed by Dr. Percival Spanish and more recently by Dr. Baron Hamper at [facility]. She developed palpitations for which she was treated with a beta-blocker. She recently has developed symptoms of exertional shortness of breath and increased fatigue. Follow-up echocardiogram performed at [facility] revealed mildly reduced left ventricular systolic function with ejection fraction estimated 52%, down from 60% previously. There was mild left ventricular chamber enlargement.  Surgical intervention was recommended, and the patient requested to be seen in follow-up in [place]. She was seen recently by Dr. Percival Spanish and gated coronary CT angiogram was performed to rule out the presence of ischemic heart disease. Cardiothoracic surgical consultation was requested.  The patient has been seen in consultation and counseled at length regarding the indications, risks and potential benefits of surgery.  All questions have been answered, and the patient provides full informed consent for the operation as described.    DETAILS OF THE OPERATIVE PROCEDURE  Preparation:  The patient is brought to the operating room on the above mentioned date and central monitoring was established by the anesthesia team including placement of Swan-Ganz catheter through the left internal jugular vein.  A radial arterial line is placed. The patient is placed in the supine position on the operating table.  Intravenous antibiotics are administered. General endotracheal anesthesia is induced uneventfully. The patient is initially intubated using a dual lumen endotracheal tube.  A Foley catheter is placed.  Baseline transesophageal echocardiogram was performed.  Findings were notable for moderately dilated left ventricle with mild left ventricular systolic dysfunction.  There was severe dilatation of the mitral annulus which measured well over 4 cm in its transverse dimension.  There was very mild bileaflet prolapse with primarily bileaflet billowing.  There was a central jet of mitral regurgitation.  The mitral regurgitation was moderate.  The aortic valve was normal.  Right ventricular size and function was normal.  The tricuspid annulus was not dilated.  There was trivial tricuspid regurgitation.  A soft roll is placed behind the patient's left scapula and the neck gently extended and turned to the left.   The patient's right neck, chest, abdomen, both groins, and both lower extremities are prepared  and draped in a sterile manner. A time out procedure  is performed.   Percutaneous Vascular Access:  Percutaneous arterial access was obtained on the right side, while venous access was obtained on the left because the common femoral vein appeared small on the right.  Using ultrasound guidance the left common femoral vein was cannulated using the Seldinger technique and a pair of Perclose vascular closure devises were placed, after which time an 8 French sheath inserted.  The right common femoral artery was cannulated using a micropuncture wire and sheath.  A pair of Perclose vascular closure devices were placed at opposing 30 degree angles in the femoral artery, and a 8 French sheath inserted.  The right internal jugular vein was cannulated using ultrasound guidance and an 8 French sheath inserted.     Surgical Approach:  A right miniature anterolateral thoracotomy incision is performed. The incision is placed just lateral to and superior to the right nipple. The pectoralis major muscle is retracted medially and completely preserved. The right pleural space is entered through the 3rd intercostal space. A soft tissue retractor is placed.  Two 11 mm ports are placed through separate stab incisions inferiorly. The right pleural space is insufflated continuously with carbon dioxide gas through the posterior port during the remainder of the operation.  A pledgeted sutures placed through the dome of the right hemidiaphragm and retracted inferiorly to facilitate exposure.  A longitudinal incision is made in the pericardium 3 cm anterior to the phrenic nerve and silk traction sutures are placed on either side of the incision for exposure.   Extracorporeal Cardiopulmonary Bypass and Myocardial Protection: percutaneous  The patient was heparinized systemically.  The right common femoral vein is cannulated through the venous sheath and a guidewire advanced into the right atrium using TEE guidance.  The femoral  vein cannulated using a 22 Fr long femoral venous cannula.  The right common femoral artery is cannulated through the arterial sheath and a guidewire advanced into the descending thoracic aorta using TEE guidance.  Femoral artery is cannulated with a 18 French femoral arterial cannula.  The right internal jugular vein is cannulated through the venous sheath and a guidewire advanced into the right atrium.  The internal jugular vein is cannulated using a 14 French pediatric femoral venous cannula.   Adequate heparinization is verified.    The entire pre-bypass portion of the operation was notable for stable hemodynamics.  Cardiopulmonary bypass was begun.  Vacuum assist venous drainage is utilized. The incision in the pericardium is extended in both directions. Venous drainage and exposure are notably excellent. A retrograde cardioplegia cannula is placed through the right atrium into the coronary sinus using transesophageal echocardiogram guidance.  An antegrade cardioplegia cannula is placed in the ascending aorta.    The patient is cooled to 32C systemic temperature.  The aortic cross clamp is applied and cardioplegia is delivered initially in an antegrade fashion through the aortic root using modified [place] cold blood cardioplegia ([place] blood cardioplegia protocol).   The initial cardioplegic arrest is rapid with early diastolic arrest.  Supplemental cardioplegia was administered retrograde through the coronary sinus catheter.  Myocardial protection was felt to be excellent.   Mitral Valve Repair:  A left atriotomy incision was performed through the interatrial groove and extended partially across the back wall of the left atrium after opening the oblique sinus inferiorly.  The mitral valve is exposed using a self-retaining retractor.  The mitral valve was inspected and notable for large dilated annulus.  There is bileaflet billowing without discrete areas of severe  prolapse.  The billowing  was more pronounced in the posterior leaflet but all valve segments were affected.  There was generous leaflet tissue in the posterior leaflet.  Chordae tendinae were mildly elongated but there were no ruptured cords..  Interrupted 2-0 Ethibond horizontal mattress sutures are placed circumferentially around the entire mitral valve annulus.  The valve was tested with saline and appeared competent even without ring annuloplasty complete. The valve was sized to a 34 mm annuloplasty ring, based upon the transverse distance between the left and right commissures and the height of the anterior leaflet, corresponding to a size just slightly larger than the overall surface area of the anterior leaflet.  The valve was downsized 1 size and a [name] annuloplasty ring (size 42mm, catalog #[id number], serial [id number]) was secured in place uneventfully. All ring sutures were secured using a Cor-knot device.    The valve was tested with saline and appeared competent. There is no residual leak. There was a broad, symmetrical line of coaptation of the anterior and posterior leaflet which was confirmed using the blue ink test.  Rewarming is begun.   Procedure Completion:  The atriotomy was closed using a 2-layer closure of running 3-0 Prolene suture after placing a sump drain across the mitral valve to serve as a left ventricular vent.  One final dose of warm retrograde "reanimation dose" cardioplegia was administered retrograde through the coronary sinus catheter while all air was evacuated through the aortic root.  The aortic cross clamp was removed after a total cross clamp time of 64 minutes.  Epicardial pacing wires are fixed to the inferior wall of the right ventricule and to the right atrial appendage. The patient is rewarmed to 37C temperature. The left ventricular vent is removed. The antegrade cardioplegia cannula is removed. The patient is weaned and disconnected from cardiopulmonary bypass.  The patient's  rhythm at separation from bypass was sinus.  The patient was weaned from bypass without any inotropic support. Total cardiopulmonary bypass time for the operation was 102 minutes.  Followup transesophageal echocardiogram performed after separation from bypass revealed a well-seated annuloplasty ring in the mitral position with a normal functioning mitral valve. There was no residual leak.  Left ventricular function was unchanged from preoperatively.  The mean gradient across the mitral valve was estimated to be 3-4 mmHg.  The femoral arterial and venous cannulae were removed uneventfully. There was a palpable pulse in the distal right common femoral artery after removal of the cannula. Protamine was administered to reverse the anticoagulation. The right internal jugular cannula was removed and manual pressure held on the neck for 15 minutes.  Single lung ventilation was begun. The atriotomy closure was inspected for hemostasis. The pericardial sac was drained using a 28 French Bard drain placed through the anterior port incision.  The right pleural space is irrigated with saline solution and inspected for hemostasis.   The On-Q pain management system is utilized for postoperative analgesia.  A single lumen catheter is passed through the subcutaneous tissues from the anterior chest wall to the posterior port incision.  The catheter was then passed through the port incision into the pleural space and tunneled into the subpleural space posteriorly to cover the second through the sixth intercostal nerve roots.  The catheter was flushed with 0.5% bupivacaine solution and ultimately connected to a continuous infusion pump.  The right pleural space was drained using a 28 French Bard drain placed through the posterior port incision. The miniature thoracotomy incision was  closed in multiple layers in routine fashion. The right groin incision was inspected for hemostasis and closed in multiple layers in routine  fashion.  The post-bypass portion of the operation was notable for stable rhythm and hemodynamics.  No blood products were administered during the operation.   Disposition:  The patient tolerated the procedure well.  The patient was extubated in the operating room and subsequently transported to the surgical intensive care unit in stable condition. There were no intraoperative complications. All sponge instrument and needle counts are verified correct at completion of the operation.     [name] Gu. Roxy Manns MD 11/28/2018 1:02 PM

## 2018-11-28 NOTE — Brief Op Note (Signed)
11/28/2018  12:59 PM  PATIENT:  [name]  43 y.o. female  PRE-OPERATIVE DIAGNOSIS:  MR  POST-OPERATIVE DIAGNOSIS:  MR  PROCEDURE:  Procedure(s): MINIMALLY INVASIVE MITRAL VALVE REPAIR (MVR) - Using Memo 4D Ring Size 32 (Right) TRANSESOPHAGEAL ECHOCARDIOGRAM (TEE) (N/A)  SURGEON:  Surgeon(s) and Role:    [name], MD - Primary  PHYSICIAN ASSISTANT:  Nicholes Rough, PA-C   ANESTHESIA:   general  EBL:  650 mL   BLOOD ADMINISTERED:none  DRAINS: ROUTINE   LOCAL MEDICATIONS USED:  NONE  SPECIMEN:  No Specimen  DISPOSITION OF SPECIMEN:  N/A  COUNTS:  YES  DICTATION: .Dragon Dictation  PLAN OF CARE: Admit to inpatient   PATIENT DISPOSITION:  ICU - intubated and hemodynamically stable.   Delay start of Pharmacological VTE agent (>24hrs) due to surgical blood loss or risk of bleeding: yes

## 2018-11-28 NOTE — Anesthesia Procedure Notes (Signed)
Procedure Name: Intubation Date/Time: 11/28/2018 8:21 AM Performed by: Inda Coke, CRNA Pre-anesthesia Checklist: Patient identified, Emergency Drugs available, Suction available and Patient being monitored Patient Re-evaluated:Patient Re-evaluated prior to induction Oxygen Delivery Method: Circle System Utilized Preoxygenation: Pre-oxygenation with 100% oxygen Induction Type: IV induction Ventilation: Mask ventilation without difficulty Laryngoscope Size: Mac and 3 Grade View: Grade I Tube type: Oral Endobronchial tube: Double lumen EBT, Left, EBT position confirmed by auscultation and EBT position confirmed by fiberoptic bronchoscope and 35 Fr Number of attempts: 1 Airway Equipment and Method: Stylet and Oral airway Placement Confirmation: ETT inserted through vocal cords under direct vision,  positive ETCO2 and breath sounds checked- equal and bilateral Secured at: 27 cm Tube secured with: Tape Dental Injury: Teeth and Oropharynx as per pre-operative assessment

## 2018-11-28 NOTE — Interval H&P Note (Signed)
History and Physical Interval Note:  11/28/2018 5:48 AM  [name]  has presented today for surgery, with the diagnosis of MR.  The various methods of treatment have been discussed with the patient and family. After consideration of risks, benefits and other options for treatment, the patient has consented to  Procedure(s) with comments: [place] (MVR) (Right) - GLUTARALDEHYDE TRANSESOPHAGEAL ECHOCARDIOGRAM (TEE) (N/A) as a surgical intervention.  The patient's history has been reviewed, patient examined, no change in status, stable for surgery.  I have reviewed the patient's chart and labs.  Questions were answered to the patient's satisfaction.     [name]

## 2018-11-28 NOTE — Progress Notes (Signed)
  Echocardiogram Echocardiogram Transesophageal has been performed.  [name] 11/28/2018, 10:08 AM
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
## 2018-11-29 ENCOUNTER — Inpatient Hospital Stay (HOSPITAL_COMMUNITY): Payer: 59

## 2018-11-29 ENCOUNTER — Encounter (HOSPITAL_COMMUNITY): Payer: Self-pay | Admitting: Thoracic Surgery (Cardiothoracic Vascular Surgery)

## 2018-11-29 LAB — CBC
HCT: 32.3 % — ABNORMAL LOW (ref 36.0–46.0)
HCT: 31.4 % — ABNORMAL LOW (ref 36.0–46.0)
Hemoglobin: 10.6 g/dL — ABNORMAL LOW (ref 12.0–15.0)
Hemoglobin: 10.3 g/dL — ABNORMAL LOW (ref 12.0–15.0)
MCH: 30 pg (ref 26.0–34.0)
MCH: 29.9 pg (ref 26.0–34.0)
MCHC: 32.8 g/dL (ref 30.0–36.0)
MCHC: 32.8 g/dL (ref 30.0–36.0)
MCV: 91.5 fL (ref 80.0–100.0)
MCV: 91.3 fL (ref 80.0–100.0)
Platelets: 169 10*3/uL (ref 150–400)
Platelets: 148 10*3/uL — ABNORMAL LOW (ref 150–400)
RBC: 3.53 MIL/uL — ABNORMAL LOW (ref 3.87–5.11)
RBC: 3.44 MIL/uL — ABNORMAL LOW (ref 3.87–5.11)
RDW: 12.9 % (ref 11.5–15.5)
RDW: 13.2 % (ref 11.5–15.5)
WBC: 12.8 10*3/uL — ABNORMAL HIGH (ref 4.0–10.5)
WBC: 14.8 10*3/uL — ABNORMAL HIGH (ref 4.0–10.5)
nRBC: 0 % (ref 0.0–0.2)
nRBC: 0 % (ref 0.0–0.2)

## 2018-11-29 LAB — BASIC METABOLIC PANEL
Anion gap: 5 (ref 5–15)
Anion gap: 7 (ref 5–15)
BUN: 5 mg/dL — ABNORMAL LOW (ref 6–20)
BUN: 6 mg/dL (ref 6–20)
CO2: 27 mmol/L (ref 22–32)
CO2: 24 mmol/L (ref 22–32)
Calcium: 7.3 mg/dL — ABNORMAL LOW (ref 8.9–10.3)
Calcium: 7.5 mg/dL — ABNORMAL LOW (ref 8.9–10.3)
Chloride: 104 mmol/L (ref 98–111)
Chloride: 102 mmol/L (ref 98–111)
Creatinine, Ser: 0.79 mg/dL (ref 0.44–1.00)
Creatinine, Ser: 0.93 mg/dL (ref 0.44–1.00)
GFR calc Af Amer: >60 mL/min (ref >60)
GFR calc Af Amer: >60 mL/min (ref >60)
GFR calc non Af Amer: >60 mL/min (ref >60)
GFR calc non Af Amer: >60 mL/min (ref >60)
Glucose, Bld: 139 mg/dL — ABNORMAL HIGH (ref 70–99)
Glucose, Bld: 147 mg/dL — ABNORMAL HIGH (ref 70–99)
Potassium: 4 mmol/L (ref 3.5–5.1)
Potassium: 3.4 mmol/L — ABNORMAL LOW (ref 3.5–5.1)
Sodium: 136 mmol/L (ref 135–145)
Sodium: 133 mmol/L — ABNORMAL LOW (ref 135–145)

## 2018-11-29 LAB — MAGNESIUM
Magnesium: 2.6 mg/dL — ABNORMAL HIGH (ref 1.7–2.4)
Magnesium: 2.4 mg/dL (ref 1.7–2.4)

## 2018-11-29 LAB — POCT I-STAT 4, (NA,K, GLUC, HGB,HCT)
Glucose, Bld: 129 mg/dL — ABNORMAL HIGH (ref 70–99)
HCT: 38 % (ref 36.0–46.0)
Hemoglobin: 12.9 g/dL (ref 12.0–15.0)
Potassium: 4.3 mmol/L (ref 3.5–5.1)
Sodium: 140 mmol/L (ref 135–145)

## 2018-11-29 LAB — GLUCOSE, CAPILLARY
Glucose-Capillary: 145 mg/dL — ABNORMAL HIGH (ref 70–99)
Glucose-Capillary: 118 mg/dL — ABNORMAL HIGH (ref 70–99)
Glucose-Capillary: 121 mg/dL — ABNORMAL HIGH (ref 70–99)
Glucose-Capillary: 130 mg/dL — ABNORMAL HIGH (ref 70–99)

## 2018-11-29 MED ORDER — KETOROLAC TROMETHAMINE 15 MG/ML IJ SOLN
15.0000 mg | Freq: Four times a day (QID) | INTRAMUSCULAR | Status: AC
  Administered 2018-11-29 – 2018-11-30 (×5): 15 mg via INTRAVENOUS
  Filled 2018-11-29 (×5): qty 1

## 2018-11-29 MED ORDER — SODIUM CHLORIDE 0.9% FLUSH
10.0000 mL | Freq: Two times a day (BID) | INTRAVENOUS | Status: DC
  Administered 2018-11-29: 09:00:00 10 mL
  Administered 2018-11-29: 23:00:00 20 mL
  Administered 2018-11-30 – 2018-12-02 (×3): 10 mL

## 2018-11-29 MED ORDER — SODIUM CHLORIDE 0.9% FLUSH: 10.0000 mL | INTRAVENOUS | Status: DC | PRN

## 2018-11-29 MED ORDER — FUROSEMIDE 10 MG/ML IJ SOLN
20.0000 mg | Freq: Two times a day (BID) | INTRAMUSCULAR | Status: AC
  Administered 2018-11-29 (×2): 20 mg via INTRAVENOUS
  Filled 2018-11-29 (×2): qty 2

## 2018-11-29 MED ORDER — ENOXAPARIN SODIUM 30 MG/0.3ML ~~LOC~~ SOLN
30.0000 mg | Freq: Every day | SUBCUTANEOUS | Status: DC
  Administered 2018-11-30 – 2018-12-02 (×3): 30 mg via SUBCUTANEOUS
  Filled 2018-11-29 (×3): qty 0.3

## 2018-11-29 MED ORDER — INSULIN ASPART 100 UNIT/ML ~~LOC~~ SOLN
0.0000 [IU] | SUBCUTANEOUS | Status: DC
  Administered 2018-11-29: 21:00:00 2 [IU] via SUBCUTANEOUS

## 2018-11-29 MED ORDER — MOVING RIGHT ALONG BOOK
Freq: Once | Status: DC
  Filled 2018-11-29 (×3): qty 1

## 2018-11-29 MED ORDER — FUROSEMIDE 10 MG/ML IJ SOLN: 20.0000 mg | Freq: Once | INTRAMUSCULAR | Status: DC

## 2018-11-29 MED ORDER — WARFARIN SODIUM 2.5 MG PO TABS
2.5000 mg | ORAL_TABLET | Freq: Every day | ORAL | Status: DC
  Administered 2018-11-29 – 2018-11-30 (×2): 2.5 mg via ORAL
  Filled 2018-11-29 (×2): qty 1

## 2018-11-29 MED ORDER — FUROSEMIDE 40 MG PO TABS
40.0000 mg | ORAL_TABLET | Freq: Every day | ORAL | Status: AC
  Administered 2018-11-30 – 2018-12-01 (×2): 40 mg via ORAL
  Filled 2018-11-29 (×2): qty 1

## 2018-11-29 MED ORDER — WARFARIN - PHYSICIAN DOSING INPATIENT: Freq: Every day | Status: DC

## 2018-11-29 MED ORDER — POTASSIUM CHLORIDE CRYS ER 20 MEQ PO TBCR: 20.0000 meq | EXTENDED_RELEASE_TABLET | Freq: Two times a day (BID) | ORAL | Status: DC

## 2018-11-29 NOTE — Anesthesia Postprocedure Evaluation (Signed)
Anesthesia Post Note  Patient: [name]  Procedure(s) Performed: MINIMALLY INVASIVE MITRAL VALVE REPAIR (MVR) - Using Memo 4D Ring Size 32 (Right Chest) TRANSESOPHAGEAL ECHOCARDIOGRAM (TEE) (N/A )     Patient location during evaluation: SICU Anesthesia Type: General Level of consciousness: awake and patient cooperative Pain management: pain level controlled Vital Signs Assessment: post-procedure vital signs reviewed and stable Respiratory status: spontaneous breathing, nonlabored ventilation, respiratory function stable and patient connected to nasal cannula oxygen Cardiovascular status: stable Postop Assessment: no apparent nausea or vomiting Anesthetic complications: no    Last Vitals:  Vitals:   11/29/18 1228 11/29/18 1547  BP: 93/63 101/66  Pulse: 84   Resp: 18   Temp: 36.6 C 36.6 C  SpO2:      Last Pain:  Vitals:   11/29/18 1547  TempSrc: Oral  PainSc:                  Rokia Bosket

## 2018-11-29 NOTE — Progress Notes (Signed)
TCTS DAILY ICU PROGRESS NOTE                   [facility].[address]            ,[place] [address]          [phone]   1 Day Post-Op Procedure(s) (LRB): MINIMALLY INVASIVE MITRAL VALVE REPAIR (MVR) - Using Memo 4D Ring Size 32 (Right) TRANSESOPHAGEAL ECHOCARDIOGRAM (TEE) (N/A)  Total Length of Stay:  LOS: 1 day   Subjective: She feels good this morning. She did have some pain last night but the pain medication helped.   Objective: Vital signs in last 24 hours: Temp:  [97 F (36.1 C)-98.2 F (36.8 C)] 97.7 F (36.5 C) (05/28 0805) Pulse Rate:  [78-97] 88 (05/28 0700) Cardiac Rhythm: Normal sinus rhythm (05/28 0400) Resp:  [11-33] 18 (05/28 0700) BP: (76-138)/(62-98) 98/62 (05/28 0700) SpO2:  [98 %-100 %] 98 % (05/28 0700) Arterial Line BP: (102-151)/(57-91) 102/57 (05/28 0700) Weight:  [86.5 kg] 86.5 kg (05/28 0500)  Filed Weights   11/29/18 0500  Weight: 86.5 kg    Weight change:    Hemodynamic parameters for last 24 hours: PAP: (15-29)/(3-12) 22/7 CO:  [3.2 L/min-4.1 L/min] 4.1 L/min CI:  [1.8 L/min/m2-2.2 L/min/m2] 2.2 L/min/m2  Intake/Output from previous day: 05/27 0701 - 05/28 0700 In: 5110.3 [P.O.:220; I.V.:3393.5; Blood:465; IV Piggyback:1031.7] Out: 5366 [Urine:5315; Blood:650; Chest Tube:510]  Intake/Output this shift: No intake/output data recorded.  Current Meds: Scheduled Meds: . acetaminophen  1,000 mg Oral Q6H  . aspirin EC  325 mg Oral Daily  . bisacodyl  10 mg Oral Daily   Or  . bisacodyl  10 mg Rectal Daily  . chlorhexidine  15 mL Mouth/Throat NOW  . Chlorhexidine Gluconate Cloth  6 each Topical Daily  . docusate sodium  200 mg Oral Daily  . [START ON 11/30/2018] enoxaparin (LOVENOX) injection  30 mg Subcutaneous QHS  . furosemide  20 mg Intravenous BID  . [START ON 11/30/2018] furosemide  40 mg Oral Daily  . insulin aspart  0-24 Units Subcutaneous Q4H  . ketorolac  15 mg Intravenous Q6H  . metoprolol tartrate  12.5 mg Oral  BID  . moving right along book   Does not apply Once  . mupirocin ointment  1 application Nasal BID  . [START ON 11/30/2018] pantoprazole  40 mg Oral Daily  . [START ON 11/30/2018] potassium chloride  20 mEq Oral BID  . sodium chloride flush  10-40 mL Intracatheter Q12H  . sodium chloride flush  3 mL Intravenous Q12H  . warfarin  2.5 mg Oral q1800  . Warfarin - Physician Dosing Inpatient   Does not apply q1800   Continuous Infusions: . sodium chloride    . cefUROXime (ZINACEF)  IV 200 mL/hr at 11/29/18 0500  . lactated ringers    . lactated ringers     PRN Meds:.metoprolol tartrate, morphine injection, ondansetron (ZOFRAN) IV, oxyCODONE, sodium chloride flush, sodium chloride flush, traMADol  General appearance: alert, cooperative and no distress Heart: regular rate and rhythm, S1, S2 normal, no murmur, click, rub or gallop Lungs: clear to auscultation bilaterally Abdomen: soft, non-tender; bowel sounds normal; no masses,  no organomegaly Extremities: extremities normal, atraumatic, no cyanosis or edema Wound: clean and dry dressed with a sterile dressing  Lab Results: CBC: Recent Labs    11/28/18 1926  11/28/18 1932 11/29/18 0410  WBC 16.3*  --   --  12.8*  HGB 10.9*   < >  11.9* 10.6*  HCT 32.8*   < > 35.0* 32.3*  PLT 161  --   --  169   < > = values in this interval not displayed.   BMET:  Recent Labs    11/28/18 1223  11/28/18 1926 11/28/18 1927 11/28/18 1932  NA 141   < >  --  138 137  K 4.1   < >  --  4.4 4.4  CL  --   --   --   --  106  GLUCOSE 135*  --   --   --  141*  BUN  --   --   --   --  6  CREATININE  --   --  0.72  --  0.70   < > = values in this interval not displayed.    CMET: Lab Results  Component Value Date   WBC 12.8 (H) 11/29/2018   HGB 10.6 (L) 11/29/2018   HCT 32.3 (L) 11/29/2018   PLT 169 11/29/2018   GLUCOSE 141 (H) 11/28/2018   CHOL 179 07/10/2018   TRIG 142.0 07/10/2018   HDL 54.70 07/10/2018   LDLCALC 96 07/10/2018   ALT 17  11/19/2018   AST 18 11/19/2018   NA 137 11/28/2018   K 4.4 11/28/2018   CL 106 11/28/2018   CREATININE 0.70 11/28/2018   BUN 6 11/28/2018   CO2 19 (L) 11/19/2018   TSH 1.48 10/30/2018   INR 1.4 (H) 11/28/2018   HGBA1C 5.5 11/19/2018      PT/INR:  Recent Labs    11/28/18 1345  LABPROT 16.8*  INR 1.4*   Radiology: Dg Chest Port 1 View  Result Date: 11/29/2018 CLINICAL DATA:  MVR.  Sore chest. EXAM: PORTABLE CHEST 1 VIEW COMPARISON:  11/28/2018. FINDINGS: Right chest tube and Swan-Ganz catheter in stable position. No pneumothorax. Prior MVR. Stable cardiomegaly. No pulmonary venous congestion. Atelectatic changes right lung base again noted. Small right pleural effusion cannot be excluded. Mild right chest wall subcutaneous emphysema. IMPRESSION: 1. Right chest tube and Swan-Ganz catheter stable position. No pneumothorax. 2. Prior mitral valve replacement. Stable mild cardiomegaly. No pulmonary venous congestion. 3. Mild right base atelectasis again noted. Small right pleural effusion cannot be excluded. Electronically Signed   By: Marcello Moores  Register   On: 11/29/2018 07:46   Dg Chest Port 1 View  Result Date: 11/28/2018 CLINICAL DATA:  Mitral valve replacement EXAM: PORTABLE CHEST 1 VIEW COMPARISON:  Chest x-ray dated 11/19/2018 FINDINGS: A right-sided chest tube is most place with no evidence of a significant right-sided pneumothorax. Multiple wires project over the heart. The patient is status post mitral valve replacement. A left IJ approach Swan-Ganz catheter is noted with tip projecting over the right pulmonary artery. No evidence of a left-sided pneumothorax. There is a small right-sided pleural effusion. There is airspace opacity at the right lung base which may represent atelectasis or contusion. The heart size is mildly enlarged. There is some mild elevation of the right hemidiaphragm. There is gaseous distention of the stomach. IMPRESSION: Postsurgical changes as above with no  evidence of a pneumothorax. Electronically Signed   By: Constance Holster M.D.   On: 11/28/2018 14:08     Assessment/Plan: S/P Procedure(s) (LRB): MINIMALLY INVASIVE MITRAL VALVE REPAIR (MVR) - Using Memo 4D Ring Size 32 (Right) TRANSESOPHAGEAL ECHOCARDIOGRAM (TEE) (N/A)  1. CV-BP well controlled. NSR in the 70s. Off all pressors. EKG reviewed.  2. Pulm-Good oxygen saturation on room air. Continue to use incentive spirometer. Mild  right base atelectasis on CXR. CT put out 510cc/24 hours-keep 3. Renal-creatinine 0.70, electrolytes okay. Received Lasix yesterday with excellent urine output. Continue for two more doses.  4. H and H 10.6/32.3, expected acute blood loss anemia. 5. Endo-blood glucose well controlled 6. Anticoagulation-start low-dose coumadin today. INR 1.4  Plan: Doing well POD 1. She is on room air with good saturation levels. Keep chest tubes due to output-can probably remove tomorrow. Encouraged to ambulate in the halls. See progression orders.    Elgie Collard 11/29/2018 8:21 AM

## 2018-11-29 NOTE — Progress Notes (Signed)
CARDIAC REHAB PHASE I   PRE:  Rate/Rhythm: Sinus Rhythm first degree heart block 86  BP:  Supine: 96/71  Sitting: 83/56  Standing: Did not stand c/o feeling lightheaded   SaO2: 98%  MODE:  Did not walk due to orthostatic hypotension   POST:   BP:  Supine: 97/69    1130-1200 Attempted to ambulate has recently taken pain medication. Complained of feeling lightheaded after assisting to side of bed. Blood pressure noted at 83/56. Patient assisted back to bed. Reported her symptoms felt better after lying back down .Recheck blood pressure 97/69 Encouraged use of incentive spirometer. Patient's RN made aware of BP. Will follow up with patient later or tomorrow.     Harrell Gave RN BSN

## 2018-11-30 ENCOUNTER — Inpatient Hospital Stay (HOSPITAL_COMMUNITY): Payer: 59

## 2018-11-30 LAB — CBC
HCT: 29 % — ABNORMAL LOW (ref 36.0–46.0)
Hemoglobin: 9.4 g/dL — ABNORMAL LOW (ref 12.0–15.0)
MCH: 30 pg (ref 26.0–34.0)
MCHC: 32.4 g/dL (ref 30.0–36.0)
MCV: 92.7 fL (ref 80.0–100.0)
Platelets: 125 10*3/uL — ABNORMAL LOW (ref 150–400)
RBC: 3.13 MIL/uL — ABNORMAL LOW (ref 3.87–5.11)
RDW: 13.2 % (ref 11.5–15.5)
WBC: 12 10*3/uL — ABNORMAL HIGH (ref 4.0–10.5)
nRBC: 0.2 % (ref 0.0–0.2)

## 2018-11-30 LAB — BASIC METABOLIC PANEL
Anion gap: 6 (ref 5–15)
BUN: 7 mg/dL (ref 6–20)
CO2: 25 mmol/L (ref 22–32)
Calcium: 7.8 mg/dL — ABNORMAL LOW (ref 8.9–10.3)
Chloride: 104 mmol/L (ref 98–111)
Creatinine, Ser: 0.83 mg/dL (ref 0.44–1.00)
GFR calc Af Amer: >60 mL/min (ref >60)
GFR calc non Af Amer: >60 mL/min (ref >60)
Glucose, Bld: 133 mg/dL — ABNORMAL HIGH (ref 70–99)
Potassium: 3.8 mmol/L (ref 3.5–5.1)
Sodium: 135 mmol/L (ref 135–145)

## 2018-11-30 LAB — GLUCOSE, CAPILLARY
Glucose-Capillary: 116 mg/dL — ABNORMAL HIGH (ref 70–99)
Glucose-Capillary: 106 mg/dL — ABNORMAL HIGH (ref 70–99)
Glucose-Capillary: 94 mg/dL (ref 70–99)
Glucose-Capillary: 106 mg/dL — ABNORMAL HIGH (ref 70–99)
Glucose-Capillary: 109 mg/dL — ABNORMAL HIGH (ref 70–99)

## 2018-11-30 LAB — PROTIME-INR
INR: 1.1 (ref 0.8–1.2)
Prothrombin Time: 14.5 seconds (ref 11.4–15.2)

## 2018-11-30 MED ORDER — POTASSIUM CHLORIDE CRYS ER 20 MEQ PO TBCR
20.0000 meq | EXTENDED_RELEASE_TABLET | Freq: Every day | ORAL | Status: DC
  Administered 2018-12-01 – 2018-12-03 (×3): 20 meq via ORAL
  Filled 2018-11-30 (×3): qty 1

## 2018-11-30 MED ORDER — FE FUMARATE-B12-VIT C-FA-IFC PO CAPS
1.0000 | ORAL_CAPSULE | Freq: Every day | ORAL | Status: DC
  Administered 2018-11-30 – 2018-12-03 (×4): 1 via ORAL
  Filled 2018-11-30 (×4): qty 1

## 2018-11-30 MED ORDER — ASPIRIN EC 81 MG PO TBEC
81.0000 mg | DELAYED_RELEASE_TABLET | Freq: Every day | ORAL | Status: DC
  Administered 2018-11-30 – 2018-12-03 (×4): 81 mg via ORAL
  Filled 2018-11-30 (×4): qty 1

## 2018-11-30 MED ORDER — METOPROLOL TARTRATE 25 MG PO TABS
25.0000 mg | ORAL_TABLET | Freq: Two times a day (BID) | ORAL | Status: DC
  Administered 2018-11-30 – 2018-12-03 (×7): 25 mg via ORAL
  Filled 2018-11-30 (×7): qty 1

## 2018-11-30 MED ORDER — POTASSIUM CHLORIDE CRYS ER 20 MEQ PO TBCR: 40.0000 meq | EXTENDED_RELEASE_TABLET | Freq: Two times a day (BID) | ORAL | Status: DC

## 2018-11-30 MED ORDER — POTASSIUM CHLORIDE CRYS ER 20 MEQ PO TBCR
40.0000 meq | EXTENDED_RELEASE_TABLET | Freq: Two times a day (BID) | ORAL | Status: AC
  Administered 2018-11-30 (×2): 40 meq via ORAL
  Filled 2018-11-30 (×2): qty 2

## 2018-11-30 MED FILL — Heparin Sodium (Porcine) Inj 1000 Unit/ML: INTRAMUSCULAR | Qty: 30 | Status: AC

## 2018-11-30 MED FILL — Heparin Sodium (Porcine) Inj 1000 Unit/ML: INTRAMUSCULAR | Qty: 10 | Status: AC

## 2018-11-30 MED FILL — Mannitol IV Soln 20%: INTRAVENOUS | Qty: 1000 | Status: AC

## 2018-11-30 MED FILL — Sodium Bicarbonate IV Soln 8.4%: INTRAVENOUS | Qty: 50 | Status: AC

## 2018-11-30 MED FILL — Lidocaine HCl(Cardiac) IV PF Soln Pref Syr 100 MG/5ML (2%): INTRAVENOUS | Qty: 25 | Status: AC

## 2018-11-30 MED FILL — Electrolyte-R (PH 7.4) Solution: INTRAVENOUS | Qty: 4000 | Status: AC

## 2018-11-30 MED FILL — Heparin Sodium (Porcine) Inj 1000 Unit/ML: INTRAMUSCULAR | Qty: 2500 | Status: AC

## 2018-11-30 MED FILL — Potassium Chloride Inj 2 mEq/ML: INTRAVENOUS | Qty: 40 | Status: AC

## 2018-11-30 MED FILL — Magnesium Sulfate Inj 50%: INTRAMUSCULAR | Qty: 10 | Status: AC

## 2018-11-30 MED FILL — Albumin, Human Inj 5%: INTRAVENOUS | Qty: 250 | Status: AC

## 2018-11-30 MED FILL — Sodium Chloride IV Soln 0.9%: INTRAVENOUS | Qty: 2000 | Status: AC

## 2018-11-30 NOTE — Discharge Summary (Signed)
[address]       [place] [address]             [phone]      Physician Discharge Summary  Patient ID: [name] MRN: [id number] DOB/AGE: 1975-09-15 43 y.o.  Admit date: 11/28/2018 Discharge date: 12/03/2018  Admission Diagnoses:  Patient Active Problem List   Diagnosis Date Noted   Hyperglycemia 10/30/2018   Diuretic-induced hypokalemia 10/30/2018   Acute non-recurrent maxillary sinusitis 10/30/2018   Essential hypertension 07/10/2018   Routine general medical examination at a health care facility 07/10/2018   Snoring 10/28/2015   Other specified hypothyroidism 09/22/2014   Visit for screening mammogram 09/22/2014   Insomnia, persistent 03/19/2014   Lynch syndrome 11/28/2012   Family history of colon cancer 08/13/2012   MR (mitral regurgitation) 10/28/2011   MVP (mitral valve prolapse) 10/28/2011   Migraine without aura 03/26/2008   ALLERGIC RHINITIS 11/06/2007   GERD 11/06/2007    Discharge Diagnoses:  Principal Problem:   S/P minimally invasive mitral valve repair Active Problems:   GERD   MR (mitral regurgitation)   MVP (mitral valve prolapse)   Essential hypertension   Discharged Condition: good  HPI:  Patient is anotherwise healthy 43 year old African-American female with known history of mitral valve prolapse with mitral regurgitation who has been referred for surgical consultation.  I had the opportunity to evaluate the patient in consultation in 2013. Transesophageal echocardiogram revealed bileaflet prolapse of mitral valve with moderate mitral regurgitation. Left ventricular size and systolic function was normal. At that time she was entirely asymptomatic. Continued medical therapy with close follow-up was recommended. Over the years she has been followed by Dr. Percival Spanish and more recently by Dr. Baron Hamper at [facility]. She developed palpitations for which she was treated  with a beta-blocker. She recently has developed symptoms of exertional shortness of breath and increased fatigue. Follow-up echocardiogram performed at [facility] revealed mildly reduced left ventricular systolic function with ejection fraction estimated 52%, down from 60% previously. There was mild left ventricular chamber enlargement. Surgical intervention was recommended, and the patient requested to be seen in follow-up in [place]. She was seen recently by Dr. Percival Spanish and gated coronary CT angiogram was performed to rule out the presence of ischemic heart disease. Cardiothoracic surgical consultation was requested.  Patient is married but separated from her husband and lives locally in [place]. She has 2 adult children ages 70 and 47, 1 of whom is in college. She works full-time as a Education officer, museum for a local dialysis center. She recently has been trying to exercise at a local gym where she jogs and exercises on elliptical trainers and bicycles. She has developed exertional shortness of breath and increased fatigue. She stopped any form of strenuous exercise after her recent office visit with Dr. Percival Spanish. She denies shortness of breath with low-level activity. She has had sporadic dizzy spells that are not related to activity and do not seem to be orthostatic in nature. She denies any resting shortness of breath, PND, orthopnea, or syncope. She has had some slight edema around both ankles. She has never had any chest pain or chest tightness either with activity or at rest.  Patient was seen in consultation 08/30/2018 and plans were made for elective mitral valve repair.  However, her surgery was postponed due to the [place] pandemic.  She has been followed carefully via telemedicine and now returns to the office with hopes to proceed  with surgery next week. Over the past 2 weeks she has remained clinically stable. Prior to that her symptoms had progressed,  for which she was restarted on a diuretic.  She states that ever since she resumed taking Lasix 3 weeks ago she has been feeling much better. She now gets short of breath only with more strenuous exertion and she has been sleeping comfortably at night. She has still been going to work but she has been practicing strict social distancing per guidelines. She has not developed any fever, cough, or worsening shortness of breath. She otherwise feels well and she is eager to proceed with surgical intervention as previously planned.   Hospital Course:   On 11/28/2018 [name] underwent a minimally invasive mitral valve repair by Dr. Roxy Manns. She tolerated the procedure well and was transferred to the surgical ICU for continued care. She was extubated in a timely manner. POD 1 she was doing well. She did have some increased pain but the medication was helping. She was maintaining NSR without any rhythm issues. We did start low-dose coumadin on her for valve thrombosis prophylaxis.  She is on room air with excellent oxygen saturation. Keeping the chest tubes in due to drainage. She was transferred to the telemetry unit for continued care.  POD 2 she was in NSR. She was given some lasix for expected fluid overload.  She transferred to the telemetry unit in stable condition.  Her chest tubes were removed on 12/01/2018.  Follow up CXR showed small residual right sided pleural effusion with trace apical pneumothorax.  She remained in NSR or mild sinus tachycardia and her pacing wires were removed without difficulty.  She is ambulating without difficulty.  Her incision is healing without evidence of infection.  She is medically stable for discharge home today.  INR on the day of discharge was 1.5.    She will be discharged home on 5 mg coumadin daily with a goal INR of 2.5.  Consults: None  Significant Diagnostic Studies:   CLINICAL DATA:  Atelectasis.  EXAM: PORTABLE CHEST 1 VIEW  COMPARISON:  Radiograph  of Nov 29, 2018.  FINDINGS: Stable cardiomegaly. Swan-Ganz catheter has been removed. Left internal jugular venous sheath remains. Right-sided chest tube is unchanged in position. No pneumothorax is noted. Left lung is clear. Status post cardiac valve repair. Stable right basilar atelectasis is noted. No significant pleural effusion is noted. Bony thorax is unremarkable.  IMPRESSION: Stable position of right-sided chest tube without pneumothorax. Stable right basilar atelectasis is noted.   Electronically Signed   By: Marijo Conception M.D.   On: 11/30/2018 07:54      Treatments:            CARDIOTHORACIC SURGERY OPERATIVE NOTE  Date of Procedure:                11/28/2018  Preoperative Diagnosis:        Mitral Valve Prolapse with Symptomatic Primary Mitral Regurgitation  Postoperative Diagnosis:    Same  Procedure:        Minimally-Invasive Mitral Valve Repair             Sorin Memo 4D Ring Annuloplasty (size 74mm, catalog # [id number], serial # [id number])               Surgeon:        Valentina Gu. Roxy Manns, MD  Assistant:       Nicholes Rough, PA-C  Anesthesia:    Laurie Panda, MD  Operative  Findings: ? Severe annular dilitation of the mitral valve ? Bileaflet billowing with mild prolapse but no ruptured chordae tendinae nor flail segments ? Primarily type I leaflet dysfunction with mild type II dysfunction ? Mild left ventricular systolic dysfunction ? No residual mitral regurgitation after successful valve repair          Discharge Exam: Blood pressure 108/69, pulse 98, temperature 98.2 F (36.8 C), temperature source Oral, resp. rate 19, height 5\' 3"  (1.6 m), weight 82.8 kg, last menstrual period 01/12/2012, SpO2 98 %.    General appearance:alert, cooperative and no distress Heart:regular rate and rhythm Lungs: breath sounds clear. Good O2 sats on RA. Abdomen:soft, non-tender Extremities:edemanone present Wound: right chest incision  clean and dry, expected ecchymosis present  Disposition:  Discharged to home is stable condition.   Allergies as of 12/03/2018   No Known Allergies     Medication List    STOP taking these medications   HAIR SKIN & NAILS GUMMIES PO   multivitamin with minerals Tabs tablet   nebivolol 10 MG tablet Commonly known as:  Bystolic     TAKE these medications   aspirin 81 MG EC tablet Take 1 tablet (81 mg total) by mouth daily.   clindamycin 1 % external solution Commonly known as:  CLEOCIN T Apply 1 application topically 2 (two) times daily.   ferrous [id number] C-folic acid capsule Commonly known as:  Foltrin Take 1 capsule by mouth 3 (three) times daily after meals. Take for 1 month.   furosemide 20 MG tablet Commonly known as:  LASIX Take 1 tablet (20 mg total) by mouth daily.   metoprolol tartrate 25 MG tablet Commonly known as:  LOPRESSOR Take 1 tablet (25 mg total) by mouth 2 (two) times daily.   oxyCODONE-acetaminophen 5-325 MG tablet Commonly known as:  Percocet Take 1 tablet by mouth every 4 (four) hours as needed for up to 7 days for severe pain.   potassium chloride 10 MEQ CR capsule Commonly known as:  MICRO-K Take 1 capsule (10 mEq total) by mouth 2 (two) times daily.   tazarotene 0.1 % cream Commonly known as:  AVAGE Apply 1 application topically at bedtime.   Ubrogepant 100 MG Tabs Commonly known as:  Ubrelvy Take 1 tablet by mouth as needed. What changed:    when to take this  reasons to take this   warfarin 5 MG tablet Commonly known as:  COUMADIN Take 1 tablet (5 mg total) by mouth daily at 6 PM. Take one tablet daily or as instructed by the Coumadin Clinic.      Follow-up Information    [name], MD. Go on 12/10/2018.   Specialty:  Internal Medicine Why:  Appointment is Monday 12/10/18 at 8:30 am. Contact information: [address] [place] [address] [phone]        [name], MD Follow  up on 12/17/2018.   Specialty:  Cardiothoracic Surgery Why:  Appointment is on Monday 12/17/18 at 1 pm.  Please arrive 30 min early for a chest x-ray to be done on the first floor of the same building.  Contact information: [address] [place] [address] [phone]               Lendon Colonel, NP Follow up.   Specialties:  Nurse Practitioner, Radiology, Cardiology Why:  You are scheduled for a virtual (telephone) visit with Jory Sims, NP on Monday 12/17/18 at 11 am.  Contact  information: [address] [place] [address] [phone]               Coumadin Clinic Thursday 12/06/2018 at 2:30pm for PT / INR   The patient has been discharged on:   1.Beta Blocker:  Yes [ x  ]                              No   [   ]                              If No, reason:  2.Ace Inhibitor/ARB: Yes [   ]                                     No  [   x ]                                     If No, reason: normotensive.  3.Statin:   Yes [   ]                  No  [ x  ]                  If No, reason: no coronary disease  4.Shela CommonsVelta Addison  [ x  ]                  No   [   ]                  If No, reason:   Signed: Antony Odea, PA-C 12/03/2018, 9:51 AM

## 2018-11-30 NOTE — Progress Notes (Signed)
CARDIAC REHAB PHASE I   PRE:  Rate/Rhythm: Sinus tach 105  BP:  Supine: 129/89     SaO2: 97% Room Air  MODE:  Ambulation: 470 ft   POST:  Rate/Rhythem: Sinus tach 114  BP:    Sitting: 118/58     SaO2: 100% Room Air  1500 Patient ambulated in hallway with rolling walker with CT intact assistance times one at a slow steady pace. Tolerated well  Denied complaints or symptoms but did complain of right sided discomfort upon sitting in the chair.Patient wore mask per recommended guidelines. Encouraged use of incentive spirometer. Call bell within reach. Harrell Gave RN

## 2018-11-30 NOTE — Progress Notes (Addendum)
Epicardial pacing removal: initial bandage noted with dried red drainage prior to removal.   Lower wire removed w/o difficulty.  Scant red drainage noted.  Dry dressing applied. Superior wire noted difficulty removing.  Partially out and remaining taped to chest,  PA notified.   Pt tolerated well.  Vital sign monitoring in progress,  Remaining wire removed without difficulty by PA.  Will continue to monitor.

## 2018-11-30 NOTE — Progress Notes (Addendum)
      [address]       [place], [address]             [phone]      2 Days Post-Op Procedure(s) (LRB): MINIMALLY INVASIVE MITRAL VALVE REPAIR (MVR) - Using Memo 4D Ring Size 32 (Right) TRANSESOPHAGEAL ECHOCARDIOGRAM (TEE) (N/A) Subjective: Having some belly pain-likely due to constipation. No nausea or vomiting.  Objective: Vital signs in last 24 hours: Temp:  [97.7 F (36.5 C)-98.7 F (37.1 C)] 98.7 F (37.1 C) (05/29 0340) Pulse Rate:  [68-98] 68 (05/29 0340) Cardiac Rhythm: Normal sinus rhythm;Junctional rhythm;Heart block (05/29 0312) Resp:  [17-23] 22 (05/29 0340) BP: (93-106)/(61-72) 94/71 (05/29 0340) SpO2:  [97 %-100 %] 98 % (05/29 0340)  Hemodynamic parameters for last 24 hours: PAP: (23)/(9) 23/9 CO:  [3.7 L/min] 3.7 L/min CI:  [2 L/min/m2] 2 L/min/m2  Intake/Output from previous day: 05/28 0701 - 05/29 0700 In: 885.3 [P.O.:720; I.V.:54.8; IV Piggyback:110.5] Out: 830 [Urine:210; Chest Tube:620] Intake/Output this shift: No intake/output data recorded.  General appearance: alert, cooperative and no distress Heart: regular rate and rhythm, S1, S2 normal, no murmur, click, rub or gallop Lungs: clear to auscultation bilaterally Abdomen: soft, non-tender; bowel sounds normal; no masses,  no organomegaly Extremities: extremities normal, atraumatic, no cyanosis or edema Wound: clean and dry dressed with a sterile dressing  Lab Results: Recent Labs    11/29/18 2003 11/30/18 0500  WBC 14.8* 12.0*  HGB 10.3* 9.4*  HCT 31.4* 29.0*  PLT 148* 125*   BMET:  Recent Labs    11/29/18 2000 11/30/18 0500  NA 133* 135  K 3.4* 3.8  CL 102 104  CO2 24 25  GLUCOSE 147* 133*  BUN 6 7  CREATININE 0.93 0.83  CALCIUM 7.5* 7.8*    PT/INR:  Recent Labs    11/30/18 0500  LABPROT 14.5  INR 1.1   ABG    Component Value Date/Time   PHART 7.310 (L) 11/28/2018 1927   HCO3 21.0 11/28/2018 1927   TCO2 22 11/28/2018 1932   ACIDBASEDEF 5.0  (H) 11/28/2018 1927   O2SAT 98.0 11/28/2018 1927   CBG (last 3)  Recent Labs    11/29/18 1545 11/29/18 1946 11/30/18 0347  GLUCAP 121* 130* 116*    Assessment/Plan: S/P Procedure(s) (LRB): MINIMALLY INVASIVE MITRAL VALVE REPAIR (MVR) - Using Memo 4D Ring Size 32 (Right) TRANSESOPHAGEAL ECHOCARDIOGRAM (TEE) (N/A)  1. CV-BP well controlled. NSR in the 70s-junctional with 1st degree AVB. Continue low-dose Lopressor.  2. Pulm-Good oxygen saturation on room air. Continue to use incentive spirometer. Mild right base atelectasis on CXR. CT put out 620cc/24 hours-keep. CXR stable 3. Renal-creatinine 0.83, electrolytes okay. Continue PO lasix 4. H and H 9.4/29.0, expected acute blood loss anemia. 5. Endo-blood glucose well controlled 6. Anticoagulation-on low-dose coumadin.  INR 1.1  Plan: Keep chest tubes. Encouraged ambulation. Increase oral intake. Continue coumadin. Will need to remove wires before coumadin bumps. Work on improved pain control today.    LOS: 2 days    Elgie Collard 11/30/2018  I have seen and examined the patient and agree with the assessment and plan as outlined.  Leave chest tubes 1 more day.  Mobilize.  Gentle diuresis.  Increase metoprolol 25 bid.  Anticipate possible d/c home 2-3 days.  [name], MD 11/30/2018 8:10 AM

## 2018-12-01 ENCOUNTER — Inpatient Hospital Stay (HOSPITAL_COMMUNITY): Payer: 59

## 2018-12-01 LAB — GLUCOSE, CAPILLARY
Glucose-Capillary: 107 mg/dL — ABNORMAL HIGH (ref 70–99)
Glucose-Capillary: 110 mg/dL — ABNORMAL HIGH (ref 70–99)
Glucose-Capillary: 90 mg/dL (ref 70–99)

## 2018-12-01 LAB — PROTIME-INR
INR: 1.1 (ref 0.8–1.2)
Prothrombin Time: 13.9 seconds (ref 11.4–15.2)

## 2018-12-01 MED ORDER — WARFARIN SODIUM 5 MG PO TABS
5.0000 mg | ORAL_TABLET | Freq: Every day | ORAL | Status: DC
  Administered 2018-12-01 – 2018-12-02 (×2): 5 mg via ORAL
  Filled 2018-12-01 (×2): qty 1

## 2018-12-01 NOTE — Progress Notes (Signed)
CARDIAC REHAB PHASE I   PRE:  Rate/Rhythm: 90 SR    BP: sitting 114/72    SaO2: 97 RA  MODE:  Ambulation: 750 ft   POST:  Rate/Rhythm: 101 ST    BP: sitting 105/87     SaO2: 100 RA   Pt feeling much better today. Able to get OOB and ambulate independently with RW. No c/o walking, just slight pain, awaiting pain meds. To recliner. Pt planning to eat and do IS more today. Also can walk independently if staff sets up CT. Will f/u Monday. [place], ACSM 12/01/2018 9:27 AM

## 2018-12-01 NOTE — Progress Notes (Addendum)
      [address]       [place] [address]             [phone]      3 Days Post-Op Procedure(s) (LRB): MINIMALLY INVASIVE MITRAL VALVE REPAIR (MVR) - Using Memo 4D Ring Size 32 (Right) TRANSESOPHAGEAL ECHOCARDIOGRAM (TEE) (N/A)   Subjective:  No new complaints.  Pain is well controlled.  + ambulation  No BM  Objective: Vital signs in last 24 hours: Temp:  [98.3 F (36.8 C)-99.3 F (37.4 C)] 98.5 F (36.9 C) (05/30 0819) Pulse Rate:  [88-96] 88 (05/30 0819) Cardiac Rhythm: Junctional rhythm (05/29 1902) Resp:  [18-37] 18 (05/30 0819) BP: (94-128)/(61-88) 110/88 (05/30 0819) SpO2:  [96 %-99 %] 96 % (05/30 0819) Weight:  [84.7 kg] 84.7 kg (05/30 0607)  Intake/Output from previous day: 05/29 0701 - 05/30 0700 In: -  Out: 50 [Chest Tube:50]  General appearance: alert, cooperative and no distress Heart: regular rate and rhythm Lungs: clear to auscultation bilaterally Abdomen: soft, non-tender; bowel sounds normal; no masses,  no organomegaly Extremities: edema trace Wound: clean and dry  Lab Results: Recent Labs    11/29/18 2003 11/30/18 0500  WBC 14.8* 12.0*  HGB 10.3* 9.4*  HCT 31.4* 29.0*  PLT 148* 125*   BMET:  Recent Labs    11/29/18 2000 11/30/18 0500  NA 133* 135  K 3.4* 3.8  CL 102 104  CO2 24 25  GLUCOSE 147* 133*  BUN 6 7  CREATININE 0.93 0.83  CALCIUM 7.5* 7.8*    PT/INR:  Recent Labs    12/01/18 0311  LABPROT 13.9  INR 1.1   ABG    Component Value Date/Time   PHART 7.310 (L) 11/28/2018 1927   HCO3 21.0 11/28/2018 1927   TCO2 22 11/28/2018 1932   ACIDBASEDEF 5.0 (H) 11/28/2018 1927   O2SAT 98.0 11/28/2018 1927   CBG (last 3)  Recent Labs    12/01/18 0029 12/01/18 0400 12/01/18 0845  GLUCAP 90 110* 107*    Assessment/Plan: S/P Procedure(s) (LRB): MINIMALLY INVASIVE MITRAL VALVE REPAIR (MVR) - Using Memo 4D Ring Size 32 (Right) TRANSESOPHAGEAL ECHOCARDIOGRAM (TEE) (N/A)  1. CV- NSR, BP okay- on  Lopressor 25 mg BID 2. INR 1.1, will increase coumadin to 5 mg daily 3. Renal- creatinine has been stable, weight is at baseline, Lasix is completed 4. CBGs controlled, not a diabetic, will d/c SSIP, cbgs 5. Dispo- patient stable, will get CXR today to ensure no pneumothorax, 50 cc CT output yesterday, will d/c chest tubes today, d/c EPW in AM if no arrythmias present, possibly ready for d/c in next 24-48 hours   LOS: 3 days    Ellwood Handler 12/01/2018   Chart reviewed, patient examined, agree with above. CXR looks ok. CT output low so will remove them. Plan home tomorrow if no changes.

## 2018-12-02 ENCOUNTER — Inpatient Hospital Stay (HOSPITAL_COMMUNITY): Payer: 59

## 2018-12-02 LAB — PROTIME-INR
INR: 1.3 — ABNORMAL HIGH (ref 0.8–1.2)
Prothrombin Time: 16.1 seconds — ABNORMAL HIGH (ref 11.4–15.2)

## 2018-12-02 MED ORDER — LACTULOSE 10 GM/15ML PO SOLN
20.0000 g | Freq: Once | ORAL | Status: AC
  Administered 2018-12-02: 09:00:00 20 g via ORAL
  Filled 2018-12-02: qty 30

## 2018-12-02 NOTE — Progress Notes (Addendum)
      [address]       [place] [address]             [phone]      4 Days Post-Op Procedure(s) (LRB): MINIMALLY INVASIVE MITRAL VALVE REPAIR (MVR) - Using Memo 4D Ring Size 32 (Right) TRANSESOPHAGEAL ECHOCARDIOGRAM (TEE) (N/A)   Subjective:  Patient not feeling that well today.  States she is still a little sore.  Hasn't moved her bowels yet.  Wishes to stay another day  Objective: Vital signs in last 24 hours: Temp:  [98.5 F (36.9 C)-99.1 F (37.3 C)] 98.5 F (36.9 C) (05/31 0351) Pulse Rate:  [88-102] 90 (05/30 2347) Cardiac Rhythm: Normal sinus rhythm;Sinus tachycardia (05/30 2108) Resp:  [17-30] 30 (05/31 0351) BP: (101-114)/(68-88) 110/71 (05/31 0351) SpO2:  [96 %-100 %] 97 % (05/31 0351) Weight:  [83.4 kg] 83.4 kg (05/31 0351)  Intake/Output from previous day: 05/30 0701 - 05/31 0700 In: 243 [P.O.:240; I.V.:3] Out: -   General appearance: alert, cooperative and no distress Heart: regular rate and rhythm Lungs: diminished breath sounds right base Abdomen: soft, non-tender; bowel sounds normal; no masses,  no organomegaly Extremities: edema none present Wound: clean and dry, ecchymosis present  Lab Results: Recent Labs    11/29/18 2003 11/30/18 0500  WBC 14.8* 12.0*  HGB 10.3* 9.4*  HCT 31.4* 29.0*  PLT 148* 125*   BMET:  Recent Labs    11/29/18 2000 11/30/18 0500  NA 133* 135  K 3.4* 3.8  CL 102 104  CO2 24 25  GLUCOSE 147* 133*  BUN 6 7  CREATININE 0.93 0.83  CALCIUM 7.5* 7.8*    PT/INR:  Recent Labs    12/02/18 0516  LABPROT 16.1*  INR 1.3*   ABG    Component Value Date/Time   PHART 7.310 (L) 11/28/2018 1927   HCO3 21.0 11/28/2018 1927   TCO2 22 11/28/2018 1932   ACIDBASEDEF 5.0 (H) 11/28/2018 1927   O2SAT 98.0 11/28/2018 1927   CBG (last 3)  Recent Labs    12/01/18 0029 12/01/18 0400 12/01/18 0845  GLUCAP 90 110* 107*    Assessment/Plan: S/P Procedure(s) (LRB): MINIMALLY INVASIVE MITRAL VALVE  REPAIR (MVR) - Using Memo 4D Ring Size 32 (Right) TRANSESOPHAGEAL ECHOCARDIOGRAM (TEE) (N/A)  1. CV- NSR, BP controlled- continue Lopressor 25 mg BID 2. Pulm- CXR with minimal right pleural effusion, minimal right apical pneumothorax, continue IS 3. INR 1.3, continue coumadin at 5 mg daily 4. Renal- creatinine has been stable, weight is below admission weight, not requiring lasix 5. GI- constipation, BS are hypoactive, passing some gas.. will give Lactulose today 6. Dispo- patient stable, maintaining NSR will d/c EPW today, continue coumadin at 5 mg daily, lactulose for constipation, if remains stable likely for d/c in AM   LOS: 4 days    Ellwood Handler 12/02/2018    Chart reviewed, patient examined, agree with above. No BM yet but took laxative a little while ago. She wants to wait until tomorrow to go home since she is still constipated.

## 2018-12-03 ENCOUNTER — Telehealth: Payer: Self-pay

## 2018-12-03 LAB — PROTIME-INR
INR: 1.5 — ABNORMAL HIGH (ref 0.8–1.2)
Prothrombin Time: 17.9 seconds — ABNORMAL HIGH (ref 11.4–15.2)

## 2018-12-03 MED ORDER — OXYCODONE-ACETAMINOPHEN 5-325 MG PO TABS: 1.0000 | ORAL_TABLET | ORAL | 0 refills | Status: DC | PRN

## 2018-12-03 MED ORDER — METOPROLOL TARTRATE 25 MG PO TABS: 25.0000 mg | ORAL_TABLET | Freq: Two times a day (BID) | ORAL | 2 refills | Status: DC

## 2018-12-03 MED ORDER — FE FUMARATE-B12-VIT C-FA-IFC PO CAPS: 1.0000 | ORAL_CAPSULE | Freq: Three times a day (TID) | ORAL | 0 refills | Status: DC

## 2018-12-03 MED ORDER — WARFARIN SODIUM 5 MG PO TABS: 5.0000 mg | ORAL_TABLET | Freq: Every day | ORAL | 2 refills | Status: DC

## 2018-12-03 MED ORDER — ASPIRIN 81 MG PO TBEC: 81.0000 mg | DELAYED_RELEASE_TABLET | Freq: Every day | ORAL | Status: DC

## 2018-12-03 NOTE — Telephone Encounter (Signed)

## 2018-12-03 NOTE — Progress Notes (Signed)
CARDIAC REHAB PHASE I   PRE:  Rate/Rhythm: 1023 ST    BP: sitting 124/82    SaO2: 96 RA  MODE:  Ambulation: 430 ft   POST:  Rate/Rhythm: 112 ST    BP: sitting 109/77     SaO2: 96 RA  Pt moving well. Able to walk independently in hall. No c/o. Ed completed including restrictions, diet, walking at home and CRPII. Pt interested in CRPII and will send referral to [facility]. Asked RN to get pharmacist to discuss Coumadin indepth. [place], ACSM 12/03/2018 11:46 AM

## 2018-12-03 NOTE — Discharge Instructions (Signed)

## 2018-12-03 NOTE — Progress Notes (Addendum)
5 Days Post-Op Procedure(s) (LRB): MINIMALLY INVASIVE MITRAL VALVE REPAIR (MVR) - Using Memo 4D Ring Size 32 (Right) TRANSESOPHAGEAL ECHOCARDIOGRAM (TEE) (N/A) Subjective: Feeling better today, still having some soreness. Doing well with mobility. She said she is ready to go home today.  Objective: Vital signs in last 24 hours: Temp:  [98.2 F (36.8 C)-98.7 F (37.1 C)] 98.2 F (36.8 C) (06/01 0300) Pulse Rate:  [98-113] 98 (06/01 0300) Cardiac Rhythm: Sinus tachycardia (06/01 0700) Resp:  [19-27] 19 (06/01 0300) BP: (105-122)/(69-74) 108/69 (06/01 0300) SpO2:  [96 %-100 %] 98 % (06/01 0300) Weight:  [82.8 kg] 82.8 kg (06/01 0300)  Hemodynamic parameters for last 24 hours:    Intake/Output from previous day: 05/31 0701 - 06/01 0700 In: 843 [P.O.:840; I.V.:3] Out: 300 [Urine:300] Intake/Output this shift: No intake/output data recorded.  General appearance: alert, cooperative and no distress Heart: regular rate and rhythm Lungs:  breath sounds clear. Good O2 sats on RA. Abdomen: soft, non-tender Extremities: edema none present Wound: clean and dry, ecchymosis present   Lab Results: No results for input(s): WBC, HGB, HCT, PLT in the last 72 hours. BMET: No results for input(s): NA, K, CL, CO2, GLUCOSE, BUN, CREATININE, CALCIUM in the last 72 hours.  PT/INR:  Recent Labs    12/03/18 0540  LABPROT 17.9*  INR 1.5*   ABG    Component Value Date/Time   PHART 7.310 (L) 11/28/2018 1927   HCO3 21.0 11/28/2018 1927   TCO2 22 11/28/2018 1932   ACIDBASEDEF 5.0 (H) 11/28/2018 1927   O2SAT 98.0 11/28/2018 1927   CBG (last 3)  Recent Labs    12/01/18 0029 12/01/18 0400 12/01/18 0845  GLUCAP 90 110* 107*    Assessment/Plan: S/P Procedure(s) (LRB): MINIMALLY INVASIVE MITRAL VALVE REPAIR (MVR) - Using Memo 4D Ring Size 32 (Right) TRANSESOPHAGEAL ECHOCARDIOGRAM (TEE) (N/A)  -POD-5 minimally invasive mitral valve repair. Continues to make a progressive recovery.  Independent with mobility. Cardiac rhythm is stable in mildly tachycadic SR. INR showing appropriate response to Coumadin.   -History of HTN--BP well controlled on metoprolol 25mg  po BID  -Expected acute blood loss anemia. Continue iron supplement for 1 month.   -Disposition- Plan discharge today.  Instructions given. To follow up with [facility] on Thursday 12/06/18     LOS: 5 days    Antony Odea, PA-C [phone] 12/03/2018   I have seen and examined the patient and agree with the assessment and plan as outlined.  D/C home.  Instructions given.  [name], MD 12/03/2018 9:40 AM

## 2018-12-04 ENCOUNTER — Telehealth: Payer: Self-pay | Admitting: *Deleted

## 2018-12-04 NOTE — Telephone Encounter (Signed)
Pt was on the TCM discharge report per cjhart she has appt already scheduled for 12/10/18 to follow=up w/Dr. Ronnald Ramp. Called pt to verify appt, and which she states she is aware. Inform pt had some additional questions concerning discharge. completed TCM call below.[name]  Transition Care Management Follow-up Telephone Call   Date discharged? 12/03/18   How have you been since you were released from the hospital? Pt states she is doing alright   Do you understand why you were in the hospital? YES   Do you understand the discharge instructions? YES   Where were you discharged to? Home   Items Reviewed:  Medications reviewed: YES, she states she is now taking 5mg  of coumadin which has already been added to med list  Allergies reviewed: YES  Dietary changes reviewed: YES, heart healthy  Referrals reviewed: YES, she confirm both appt w/NP w/cardiology and cardiothoracic surgeon on 12/17/18   Functional Questionnaire:   Activities of Daily Living (ADLs):   She states she are independent in the following: ambulation, bathing and hygiene, feeding, continence, grooming, toileting and dressing States she doesn't require assistance    Any transportation issues/concerns?: NO   Any patient concerns? NO   Confirmed importance and date/time of follow-up visits scheduled YES, appt 12/10/18  Provider Appointment booked with Dr. Ronnald Ramp  Confirmed with patient if condition begins to worsen call PCP or go to the ER.  Patient was given the office number and encouraged to call back with question or concerns.  : YES

## 2018-12-05 ENCOUNTER — Emergency Department (HOSPITAL_COMMUNITY): Payer: 59

## 2018-12-05 ENCOUNTER — Encounter (HOSPITAL_COMMUNITY): Payer: Self-pay | Admitting: Emergency Medicine

## 2018-12-05 ENCOUNTER — Observation Stay (HOSPITAL_COMMUNITY)
Admission: EM | Admit: 2018-12-05 | Discharge: 2018-12-06 | Disposition: A | Discharge status: Home / Self Care | Payer: 59 | Attending: Internal Medicine | Admitting: Internal Medicine

## 2018-12-05 ENCOUNTER — Other Ambulatory Visit: Payer: Self-pay

## 2018-12-05 DIAGNOSIS — G4700 Insomnia, unspecified: Secondary | ICD-10-CM | POA: Insufficient documentation

## 2018-12-05 DIAGNOSIS — Z1509 Genetic susceptibility to other malignant neoplasm: Secondary | ICD-10-CM | POA: Diagnosis not present

## 2018-12-05 DIAGNOSIS — R072 Precordial pain: Secondary | ICD-10-CM

## 2018-12-05 DIAGNOSIS — Z7982 Long term (current) use of aspirin: Secondary | ICD-10-CM | POA: Diagnosis not present

## 2018-12-05 DIAGNOSIS — I5032 Chronic diastolic (congestive) heart failure: Secondary | ICD-10-CM | POA: Diagnosis not present

## 2018-12-05 DIAGNOSIS — K219 Gastro-esophageal reflux disease without esophagitis: Secondary | ICD-10-CM | POA: Diagnosis not present

## 2018-12-05 DIAGNOSIS — E860 Dehydration: Secondary | ICD-10-CM | POA: Diagnosis not present

## 2018-12-05 DIAGNOSIS — R000 Tachycardia, unspecified: Secondary | ICD-10-CM | POA: Diagnosis present

## 2018-12-05 DIAGNOSIS — E038 Other specified hypothyroidism: Secondary | ICD-10-CM | POA: Diagnosis not present

## 2018-12-05 DIAGNOSIS — I10 Essential (primary) hypertension: Secondary | ICD-10-CM | POA: Diagnosis not present

## 2018-12-05 DIAGNOSIS — Z79899 Other long term (current) drug therapy: Secondary | ICD-10-CM | POA: Diagnosis not present

## 2018-12-05 DIAGNOSIS — J90 Pleural effusion, not elsewhere classified: Secondary | ICD-10-CM | POA: Diagnosis present

## 2018-12-05 DIAGNOSIS — Z87891 Personal history of nicotine dependence: Secondary | ICD-10-CM | POA: Insufficient documentation

## 2018-12-05 DIAGNOSIS — Z7901 Long term (current) use of anticoagulants: Secondary | ICD-10-CM | POA: Insufficient documentation

## 2018-12-05 DIAGNOSIS — Z86010 Personal history of colonic polyps: Secondary | ICD-10-CM | POA: Diagnosis not present

## 2018-12-05 DIAGNOSIS — G454 Transient global amnesia: Principal | ICD-10-CM

## 2018-12-05 DIAGNOSIS — E876 Hypokalemia: Secondary | ICD-10-CM

## 2018-12-05 DIAGNOSIS — Z1159 Encounter for screening for other viral diseases: Secondary | ICD-10-CM | POA: Insufficient documentation

## 2018-12-05 DIAGNOSIS — R7989 Other specified abnormal findings of blood chemistry: Secondary | ICD-10-CM | POA: Diagnosis present

## 2018-12-05 DIAGNOSIS — I110 Hypertensive heart disease with heart failure: Secondary | ICD-10-CM | POA: Diagnosis not present

## 2018-12-05 DIAGNOSIS — G9340 Encephalopathy, unspecified: Secondary | ICD-10-CM | POA: Diagnosis present

## 2018-12-05 DIAGNOSIS — T502X5A Adverse effect of carbonic-anhydrase inhibitors, benzothiadiazides and other diuretics, initial encounter: Secondary | ICD-10-CM

## 2018-12-05 DIAGNOSIS — Z9889 Other specified postprocedural states: Secondary | ICD-10-CM | POA: Diagnosis not present

## 2018-12-05 DIAGNOSIS — R778 Other specified abnormalities of plasma proteins: Secondary | ICD-10-CM | POA: Diagnosis present

## 2018-12-05 LAB — COMPREHENSIVE METABOLIC PANEL
ALT: 56 U/L — ABNORMAL HIGH (ref 0–44)
AST: 50 U/L — ABNORMAL HIGH (ref 15–41)
Albumin: 3.1 g/dL — ABNORMAL LOW (ref 3.5–5.0)
Alkaline Phosphatase: 103 U/L (ref 38–126)
Anion gap: 11 (ref 5–15)
BUN: 8 mg/dL (ref 6–20)
CO2: 24 mmol/L (ref 22–32)
Calcium: 8.9 mg/dL (ref 8.9–10.3)
Chloride: 101 mmol/L (ref 98–111)
Creatinine, Ser: 0.91 mg/dL (ref 0.44–1.00)
GFR calc Af Amer: >60 mL/min (ref >60)
GFR calc non Af Amer: >60 mL/min (ref >60)
Glucose, Bld: 119 mg/dL — ABNORMAL HIGH (ref 70–99)
Potassium: 4.1 mmol/L (ref 3.5–5.1)
Sodium: 136 mmol/L (ref 135–145)
Total Bilirubin: 0.4 mg/dL (ref 0.3–1.2)
Total Protein: 7.1 g/dL (ref 6.5–8.1)

## 2018-12-05 LAB — DIFFERENTIAL
Abs Immature Granulocytes: 0.1 10*3/uL — ABNORMAL HIGH (ref 0.00–0.07)
Basophils Absolute: 0 10*3/uL (ref 0.0–0.1)
Basophils Relative: 0 %
Eosinophils Absolute: 0.1 10*3/uL (ref 0.0–0.5)
Eosinophils Relative: 1 %
Immature Granulocytes: 1 %
Lymphocytes Relative: 12 %
Lymphs Abs: 1.3 10*3/uL (ref 0.7–4.0)
Monocytes Absolute: 0.9 10*3/uL (ref 0.1–1.0)
Monocytes Relative: 8 %
Neutro Abs: 8.8 10*3/uL — ABNORMAL HIGH (ref 1.7–7.7)
Neutrophils Relative %: 78 %

## 2018-12-05 LAB — URINALYSIS, ROUTINE W REFLEX MICROSCOPIC
Bilirubin Urine: NEGATIVE
Glucose, UA: NEGATIVE mg/dL
Hgb urine dipstick: NEGATIVE
Ketones, ur: NEGATIVE mg/dL
Leukocytes,Ua: NEGATIVE
Nitrite: NEGATIVE
Protein, ur: NEGATIVE mg/dL
Specific Gravity, Urine: >1.046 — ABNORMAL HIGH (ref 1.005–1.030)
pH: 7 (ref 5.0–8.0)

## 2018-12-05 LAB — I-STAT CHEM 8, ED
BUN: 9 mg/dL (ref 6–20)
Calcium, Ion: 1.1 mmol/L — ABNORMAL LOW (ref 1.15–1.40)
Chloride: 102 mmol/L (ref 98–111)
Creatinine, Ser: 0.9 mg/dL (ref 0.44–1.00)
Glucose, Bld: 115 mg/dL — ABNORMAL HIGH (ref 70–99)
HCT: 34 % — ABNORMAL LOW (ref 36.0–46.0)
Hemoglobin: 11.6 g/dL — ABNORMAL LOW (ref 12.0–15.0)
Potassium: 4.1 mmol/L (ref 3.5–5.1)
Sodium: 134 mmol/L — ABNORMAL LOW (ref 135–145)
TCO2: 26 mmol/L (ref 22–32)

## 2018-12-05 LAB — CBG MONITORING, ED: Glucose-Capillary: 107 mg/dL — ABNORMAL HIGH (ref 70–99)

## 2018-12-05 LAB — CBC
HCT: 33.6 % — ABNORMAL LOW (ref 36.0–46.0)
Hemoglobin: 10.8 g/dL — ABNORMAL LOW (ref 12.0–15.0)
MCH: 29.8 pg (ref 26.0–34.0)
MCHC: 32.1 g/dL (ref 30.0–36.0)
MCV: 92.6 fL (ref 80.0–100.0)
Platelets: 297 10*3/uL (ref 150–400)
RBC: 3.63 MIL/uL — ABNORMAL LOW (ref 3.87–5.11)
RDW: 13.5 % (ref 11.5–15.5)
WBC: 11.2 10*3/uL — ABNORMAL HIGH (ref 4.0–10.5)
nRBC: 0.2 % (ref 0.0–0.2)

## 2018-12-05 LAB — RAPID URINE DRUG SCREEN, HOSP PERFORMED
Amphetamines: NOT DETECTED
Barbiturates: NOT DETECTED
Benzodiazepines: NOT DETECTED
Cocaine: NOT DETECTED
Opiates: NOT DETECTED
Tetrahydrocannabinol: NOT DETECTED

## 2018-12-05 LAB — PROTIME-INR
INR: 1.8 — ABNORMAL HIGH (ref 0.8–1.2)
Prothrombin Time: 20.7 seconds — ABNORMAL HIGH (ref 11.4–15.2)

## 2018-12-05 LAB — I-STAT TROPONIN, ED
Troponin i, poc: 0.11 ng/mL (ref 0.00–0.08)
Troponin i, poc: 0.16 ng/mL (ref 0.00–0.08)

## 2018-12-05 LAB — SARS CORONAVIRUS 2 BY RT PCR (HOSPITAL ORDER, PERFORMED IN ~~LOC~~ HOSPITAL LAB): SARS Coronavirus 2: NEGATIVE

## 2018-12-05 LAB — SALICYLATE LEVEL: Salicylate Lvl: <7 mg/dL (ref 2.8–30.0)

## 2018-12-05 LAB — ACETAMINOPHEN LEVEL: Acetaminophen (Tylenol), Serum: <10 ug/mL — ABNORMAL LOW (ref 10–30)

## 2018-12-05 LAB — I-STAT BETA HCG BLOOD, ED (MC, WL, AP ONLY): I-stat hCG, quantitative: <5 m[IU]/mL (ref <5)

## 2018-12-05 LAB — ETHANOL: Alcohol, Ethyl (B): <10 mg/dL (ref <10)

## 2018-12-05 LAB — TROPONIN I: Troponin I: 0.12 ng/mL (ref <0.03)

## 2018-12-05 LAB — APTT: aPTT: 57 seconds — ABNORMAL HIGH (ref 24–36)

## 2018-12-05 MED ORDER — SODIUM CHLORIDE 0.9 % IV SOLN: INTRAVENOUS | Status: DC

## 2018-12-05 MED ORDER — POLYETHYLENE GLYCOL 3350 17 G PO PACK: 17.0000 g | PACK | Freq: Every day | ORAL | Status: DC | PRN

## 2018-12-05 MED ORDER — SODIUM CHLORIDE 0.9 % IV BOLUS
1000.0000 mL | Freq: Once | INTRAVENOUS | Status: AC
  Administered 2018-12-05: 1000 mL via INTRAVENOUS

## 2018-12-05 MED ORDER — OXYCODONE-ACETAMINOPHEN 5-325 MG PO TABS
1.0000 | ORAL_TABLET | Freq: Once | ORAL | Status: AC
  Administered 2018-12-05: 1 via ORAL
  Filled 2018-12-05: qty 1

## 2018-12-05 MED ORDER — ONDANSETRON HCL 4 MG PO TABS: 4.0000 mg | ORAL_TABLET | Freq: Four times a day (QID) | ORAL | Status: DC | PRN

## 2018-12-05 MED ORDER — HYDROCODONE-ACETAMINOPHEN 5-325 MG PO TABS
1.0000 | ORAL_TABLET | ORAL | Status: DC | PRN
  Administered 2018-12-06: 2 via ORAL
  Administered 2018-12-06 (×2): 1 via ORAL
  Filled 2018-12-05 (×2): qty 1
  Filled 2018-12-05: qty 2

## 2018-12-05 MED ORDER — METOPROLOL TARTRATE 25 MG PO TABS
25.0000 mg | ORAL_TABLET | Freq: Two times a day (BID) | ORAL | Status: DC
  Administered 2018-12-06: 25 mg via ORAL
  Filled 2018-12-05: qty 1

## 2018-12-05 MED ORDER — ACETAMINOPHEN 325 MG PO TABS: 650.0000 mg | ORAL_TABLET | Freq: Four times a day (QID) | ORAL | Status: DC | PRN

## 2018-12-05 MED ORDER — HYDROMORPHONE HCL 2 MG PO TABS: 3.0000 mg | ORAL_TABLET | Freq: Four times a day (QID) | ORAL | Status: DC | PRN

## 2018-12-05 MED ORDER — ACETAMINOPHEN 650 MG RE SUPP: 650.0000 mg | Freq: Four times a day (QID) | RECTAL | Status: DC | PRN

## 2018-12-05 MED ORDER — ONDANSETRON HCL 4 MG/2ML IJ SOLN: 4.0000 mg | Freq: Four times a day (QID) | INTRAMUSCULAR | Status: DC | PRN

## 2018-12-05 MED ORDER — SODIUM CHLORIDE 0.9% FLUSH: 3.0000 mL | Freq: Once | INTRAVENOUS | Status: DC

## 2018-12-05 MED ORDER — SODIUM CHLORIDE 0.9% FLUSH
3.0000 mL | Freq: Two times a day (BID) | INTRAVENOUS | Status: DC
  Administered 2018-12-06: 3 mL via INTRAVENOUS

## 2018-12-05 MED ORDER — LORAZEPAM 2 MG/ML IJ SOLN
0.5000 mg | Freq: Once | INTRAMUSCULAR | Status: AC
  Administered 2018-12-05: 0.5 mg via INTRAVENOUS
  Filled 2018-12-05: qty 1

## 2018-12-05 MED ORDER — IOHEXOL 350 MG/ML SOLN
75.0000 mL | Freq: Once | INTRAVENOUS | Status: AC | PRN
  Administered 2018-12-05: 75 mL via INTRAVENOUS

## 2018-12-05 NOTE — ED Provider Notes (Signed)
43yo female post cardiac surgery with AMS. Mitral valve revision 1 week ago by Dr. Roxy Manns, acute onset AMS around 3PM while taking a shower, repetitive questioning, no trauma. No focal findings. INR subtherapeutic at 1.8, CTA head/neck negative, discussed with CT surgery who will follow, consider possible TIA. Not TPA candidate. Plan is to do MRI brain. Persistently tachycardic. Trop elevated as expected post op. Plan is to admit, neuro consult. If MRI normal- consider TGA. Hospitalist admit. Physical Exam  BP 119/79   Pulse (!) 125   Temp 99.4 F (37.4 C) (Oral)   Resp (!) 27   LMP 01/12/2012   SpO2 100%   Physical Exam Alert, no TTP right back. Respirations even and unlabored. ED Course/Procedures    Called to room for right side chest pain and back pain. EKG repeated, repeat trop increased from 0.11 to 0.16. Visitor at bedside states memory is improving. Patient has been taking her Percocet as prescribed but has not had her pain medicine while in the ER, questions if this is related. Given Percocet.  Procedures  MDM  Care signed out to [place], PA-C pending MRI and consult for admission.       Tacy Learn, PA-C 12/05/18 2115    Drenda Freeze, MD 12/06/18 [phone]

## 2018-12-05 NOTE — ED Provider Notes (Signed)
9:47 PM Patient handoff from [facility] at shift change.  Patient with recent mitral valve surgery, on Coumadin.  Patient with altered mentation earlier today.  Symptoms are now resolved.  Her MRI is negative for stroke.  She has mildly elevated troponin however trend seems to be flat (0.11 > 0.16)  She is persistently tachycardic into the 120s.  Will discuss with hospitalist for observation admission.  Dr. Darl Householder spoke with CT surgery earlier who will follow.  Considered PE but patient is on coumadin (although subtherapeutic) and no current CP/SOB.   Patient states she is feeling better and is in agreement with plan.  She was updated on her results.  BP 102/86   Pulse (!) 122   Temp 99.4 F (37.4 C) (Oral)   Resp 16   LMP 01/12/2012   SpO2 96%    10:15 PM Spoke with Dr. Myna Hidalgo who will see.       Carlisle Cater, PA-C 12/05/18 2216    Veryl Speak, MD 12/06/18 2138

## 2018-12-05 NOTE — ED Notes (Signed)
Pt transported to CT ?
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
## 2018-12-05 NOTE — H&P (Signed)
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 pleural effusion, incidentally noted on CTA head & neck and CT surgery was consulted by EDP  - Further assess with 2-view CXR, follow-up CTS recommendations    5. Tachycardia  - HR was 120-130 in ED, sinus rhythm confirmed with EKG  - Was suspected secondary to hypovolemia in setting of recent poor appetite and has improved after 2 L NS in ED  - No cough, leg swelling or tenderness, or hypoxia to suggest PE - Hold Lasix for now, continue cardiac monitoring overnight, continue metoprolol   6. Chronic diastolic CHF  - She was tachycardic in ED, reports poor appetite since surgery, was suspected to be hypovolemic and given 2 liters NS in ED  - Hold Lasix initially, continue beta-blocker as  tolerated, follow daily wt and I/O's    PPE: Mask, face shield. Patient wearing mask.  DVT prophylaxis: warfarin  Code Status: Full  Family Communication: family updated at bedside with patient's permission  Consults called: CT surgery  Admission status: Observation     Vianne Bulls, MD [facility] Pager [phone]  If 7PM-7AM, please contact night-coverage [email or web address] Password [email or web address]  12/05/2018, 11:50 PM

## 2018-12-05 NOTE — ED Triage Notes (Signed)
Pt brought in by family member, LSN 1500 today, family reports pt got out of shower and couldn't remember the events of the morning. S/p heart surgery x 1 week ago, currently on warfarin. No weakness noted. Dr. Regenia Skeeter aware of pt.

## 2018-12-05 NOTE — ED Notes (Signed)
Pt returned from MRI °

## 2018-12-06 ENCOUNTER — Other Ambulatory Visit: Payer: Self-pay

## 2018-12-06 ENCOUNTER — Observation Stay (HOSPITAL_COMMUNITY): Payer: 59

## 2018-12-06 DIAGNOSIS — G454 Transient global amnesia: Secondary | ICD-10-CM

## 2018-12-06 DIAGNOSIS — J90 Pleural effusion, not elsewhere classified: Secondary | ICD-10-CM

## 2018-12-06 DIAGNOSIS — G9340 Encephalopathy, unspecified: Secondary | ICD-10-CM | POA: Diagnosis not present

## 2018-12-06 LAB — COMPREHENSIVE METABOLIC PANEL
ALT: 38 U/L (ref 0–44)
AST: 23 U/L (ref 15–41)
Albumin: 2.6 g/dL — ABNORMAL LOW (ref 3.5–5.0)
Alkaline Phosphatase: 83 U/L (ref 38–126)
Anion gap: 11 (ref 5–15)
BUN: <5 mg/dL — ABNORMAL LOW (ref 6–20)
CO2: 22 mmol/L (ref 22–32)
Calcium: 8.6 mg/dL — ABNORMAL LOW (ref 8.9–10.3)
Chloride: 103 mmol/L (ref 98–111)
Creatinine, Ser: 0.91 mg/dL (ref 0.44–1.00)
GFR calc Af Amer: >60 mL/min (ref >60)
GFR calc non Af Amer: >60 mL/min (ref >60)
Glucose, Bld: 113 mg/dL — ABNORMAL HIGH (ref 70–99)
Potassium: 4 mmol/L (ref 3.5–5.1)
Sodium: 136 mmol/L (ref 135–145)
Total Bilirubin: 0.4 mg/dL (ref 0.3–1.2)
Total Protein: 5.9 g/dL — ABNORMAL LOW (ref 6.5–8.1)

## 2018-12-06 LAB — CBC WITH DIFFERENTIAL/PLATELET
Abs Immature Granulocytes: 0.11 10*3/uL — ABNORMAL HIGH (ref 0.00–0.07)
Basophils Absolute: 0 10*3/uL (ref 0.0–0.1)
Basophils Relative: 0 %
Eosinophils Absolute: 0.1 10*3/uL (ref 0.0–0.5)
Eosinophils Relative: 1 %
HCT: 28.9 % — ABNORMAL LOW (ref 36.0–46.0)
Hemoglobin: 9.5 g/dL — ABNORMAL LOW (ref 12.0–15.0)
Immature Granulocytes: 1 %
Lymphocytes Relative: 13 %
Lymphs Abs: 1.2 10*3/uL (ref 0.7–4.0)
MCH: 29.6 pg (ref 26.0–34.0)
MCHC: 32.9 g/dL (ref 30.0–36.0)
MCV: 90 fL (ref 80.0–100.0)
Monocytes Absolute: 0.7 10*3/uL (ref 0.1–1.0)
Monocytes Relative: 7 %
Neutro Abs: 7.7 10*3/uL (ref 1.7–7.7)
Neutrophils Relative %: 78 %
Platelets: 259 10*3/uL (ref 150–400)
RBC: 3.21 MIL/uL — ABNORMAL LOW (ref 3.87–5.11)
RDW: 13.4 % (ref 11.5–15.5)
WBC: 9.9 10*3/uL (ref 4.0–10.5)
nRBC: 0.3 % — ABNORMAL HIGH (ref 0.0–0.2)

## 2018-12-06 LAB — PROTIME-INR
INR: 1.7 — ABNORMAL HIGH (ref 0.8–1.2)
Prothrombin Time: 19.6 seconds — ABNORMAL HIGH (ref 11.4–15.2)

## 2018-12-06 LAB — TROPONIN I
Troponin I: 0.15 ng/mL (ref <0.03)
Troponin I: 0.11 ng/mL (ref <0.03)

## 2018-12-06 LAB — HIV ANTIBODY (ROUTINE TESTING W REFLEX): HIV Screen 4th Generation wRfx: NONREACTIVE

## 2018-12-06 MED ORDER — WARFARIN - PHARMACIST DOSING INPATIENT: Freq: Every day | Status: DC

## 2018-12-06 MED ORDER — WARFARIN SODIUM 5 MG PO TABS
5.0000 mg | ORAL_TABLET | Freq: Once | ORAL | Status: AC
  Administered 2018-12-06: 5 mg via ORAL
  Filled 2018-12-06: qty 1

## 2018-12-06 MED ORDER — POTASSIUM CHLORIDE ER 10 MEQ PO CPCR: 20.0000 meq | ORAL_CAPSULE | Freq: Every day | ORAL | 1 refills | Status: DC | PRN

## 2018-12-06 MED ORDER — SENNOSIDES-DOCUSATE SODIUM 8.6-50 MG PO TABS
1.0000 | ORAL_TABLET | Freq: Two times a day (BID) | ORAL | Status: DC
  Administered 2018-12-06: 1 via ORAL
  Filled 2018-12-06: qty 1

## 2018-12-06 MED ORDER — FUROSEMIDE 20 MG PO TABS: 20.0000 mg | ORAL_TABLET | Freq: Every day | ORAL | 1 refills | Status: DC | PRN

## 2018-12-06 NOTE — Discharge Summary (Signed)
Physician Discharge Summary  AILANIE RUTTAN [name]:[id number] DOB: [birth year] DOA: 12/05/2018  PCP: [name], MD  Admit date: 12/05/2018 Discharge date: 12/06/2018  Admitted From: home Discharge disposition: home   Recommendations for Outpatient Follow-Up:   Hydration Bowel regimen Incentive spirometry INR check Monday   Discharge Diagnosis:   Principal Problem:   Acute encephalopathy Active Problems:   Sinus tachycardia   Essential hypertension   S/P minimally invasive mitral valve repair   Pleural effusion   Elevated troponin   Chronic diastolic CHF (congestive heart failure) (HCC)   Pleural effusion, right   Transient global amnesia    Discharge Condition: Improved.  Diet recommendation: Low sodium, heart healthy.  Wound care: None.  Code status: Full.   History of Present Illness:   [name] is a 43 y.o. female with medical history significant for hypertension, chronic diastolic CHF, and mitral valve prolapse status post minimally invasive repair on 11/28/2018, now presenting to the emergency department after she was found to be confused at home.  Patient was discharged from the hospital on 12/03/2018 after undergoing a minimally invasive mitral valve repair on 11/28/2018, has had a poor appetite and not eating much at home, but had been able to control her pain with Percocet and had been doing fairly well until family noted her to be confused this afternoon.  She was reportedly in her usual state at around 3 PM when she took a shower, but shortly after this was unable to recall the events of earlier today and seemed to be confused.  She denies any fevers or chills, reports some intermittent pain along the lower and medial right chest, worse with deep breath, has not had much of a cough, feels dyspneic with exertion but not at rest, and has not noted any leg swelling or tenderness since returning home.  There was no fall or trauma reported,  patient denied taking excessive amounts of her pain medication, and denies any other alcohol or illicit drug use.  She has never had this type of episode previously.   Hospital Course by Problem:     Acute encephalopathy  - Presents after an acute episode of confusion and amnesia  - MRI brain is normal, there are no focal deficits identified on exam, and the patient reports feeling back to her usual state; family at bedside agrees that this appears to have resolved  - Discussed with neuro, their input much appreciated, likely TGA and likely precipitated by recent procedure, no further workup recommended at this time    Elevated troponin  - to be expected after surgery  Mitral valve prolapse s/p repair  - She underwent minimally invasive repair on 03/03/50 without complications   Tachycardia  - HR was 120-130 in ED, sinus rhythm confirmed with EKG  - Was suspected secondary to hypovolemia in setting of recent poor appetite and has improved after 2 L NS in ED  - No cough, leg swelling or tenderness, or hypoxia to suggest PE - Hold Lasix for now, continue cardiac monitoring overnight, continue metoprolol   Dehydration -IVF    Medical Consultants:      Discharge Exam:   Vitals:   12/06/18 1100 12/06/18 1149  BP: 112/78 112/78  Pulse:  99  Resp:  (!) 27  Temp: 98.7 F (37.1 C)   SpO2:  96%   Vitals:   12/06/18 0752 12/06/18 0753 12/06/18 1100 12/06/18 1149  BP: 124/84  112/78 112/78  Pulse: (!) 106  99  Resp: 15   (!) 27  Temp:  98.4 F (36.9 C) 98.7 F (37.1 C)   TempSrc:  Oral Oral   SpO2: 97%   96%  Weight:      Height:        General exam: Appears calm and comfortable.    The results of significant diagnostics from this hospitalization (including imaging, microbiology, ancillary and laboratory) are listed below for reference.     Procedures and Diagnostic Studies:   Ct Angio Head W Or Wo Contrast  Result Date: 12/05/2018 CLINICAL DATA:  Last seen  normal at 1500 hours today. Patient amnestic after shower. EXAM: CT ANGIOGRAPHY HEAD AND NECK TECHNIQUE: Multidetector CT imaging of the head and neck was performed using the standard protocol during bolus administration of intravenous contrast. Multiplanar CT image reconstructions and MIPs were obtained to evaluate the vascular anatomy. Carotid stenosis measurements (when applicable) are obtained utilizing NASCET criteria, using the distal internal carotid diameter as the denominator. CONTRAST:  33mL OMNIPAQUE IOHEXOL 350 MG/ML SOLN COMPARISON:  MR brain 10/26/2015. FINDINGS: CT HEAD FINDINGS Brain: No evidence of acute infarction, hemorrhage, hydrocephalus, extra-axial collection or mass lesion/mass effect. Vascular: Reported separately Skull: Normal. Negative for fracture or focal lesion. Sinuses: Imaged portions are clear. Orbits: No acute finding. Review of the MIP images confirms the above findings CTA NECK FINDINGS Aortic arch: Standard branching. Imaged portion shows no evidence of aneurysm or dissection. No significant stenosis of the major arch vessel origins. Right carotid system: No evidence of dissection, stenosis (50% or greater) or occlusion. Left carotid system: No evidence of dissection, stenosis (50% or greater) or occlusion. Vertebral arteries: Codominant. No evidence of dissection, stenosis (50% or greater) or occlusion. Skeleton: Unremarkable. Other neck: No masses or fluid collections. Upper chest: Moderately large loculated RIGHT pleural effusion medially. Review of the MIP images confirms the above findings CTA HEAD FINDINGS Anterior circulation: No significant stenosis, proximal occlusion, aneurysm, or vascular malformation. Posterior circulation: No significant stenosis, proximal occlusion, aneurysm, or vascular malformation. Venous sinuses: As permitted by contrast timing, patent. Anatomic variants: None of significance. Delayed phase: No abnormal intracranial enhancement. Review of the  MIP images confirms the above findings IMPRESSION: CTA head neck demonstrates no arterial occlusion or dissection. No acute intracranial findings. No abnormal postcontrast enhancement of the brain or meninges. Moderately large RIGHT pleural effusion medially. Consider two-view chest for further evaluation. Electronically Signed   By: Staci Righter M.D.   On: 12/05/2018 18:13   Dg Chest 2 View  Result Date: 12/06/2018 CLINICAL DATA:  Follow-up right-sided pleural effusion EXAM: CHEST - 2 VIEW COMPARISON:  12/05/2018 FINDINGS: Cardiac shadow is stable. Postsurgical changes are again seen. Small right pleural effusion is again identified and stable. Mild atelectatic changes are seen. No pneumothorax is noted. IMPRESSION: Small right pleural effusion and basilar atelectasis. Electronically Signed   By: Inez Catalina M.D.   On: 12/06/2018 08:28   Ct Angio Neck W And/or Wo Contrast  Result Date: 12/05/2018 CLINICAL DATA:  Last seen normal at 1500 hours today. Patient amnestic after shower. EXAM: CT ANGIOGRAPHY HEAD AND NECK TECHNIQUE: Multidetector CT imaging of the head and neck was performed using the standard protocol during bolus administration of intravenous contrast. Multiplanar CT image reconstructions and MIPs were obtained to evaluate the vascular anatomy. Carotid stenosis measurements (when applicable) are obtained utilizing NASCET criteria, using the distal internal carotid diameter as the denominator. CONTRAST:  97mL OMNIPAQUE IOHEXOL 350 MG/ML SOLN COMPARISON:  MR brain 10/26/2015. FINDINGS: CT  HEAD FINDINGS Brain: No evidence of acute infarction, hemorrhage, hydrocephalus, extra-axial collection or mass lesion/mass effect. Vascular: Reported separately Skull: Normal. Negative for fracture or focal lesion. Sinuses: Imaged portions are clear. Orbits: No acute finding. Review of the MIP images confirms the above findings CTA NECK FINDINGS Aortic arch: Standard branching. Imaged portion shows no evidence of  aneurysm or dissection. No significant stenosis of the major arch vessel origins. Right carotid system: No evidence of dissection, stenosis (50% or greater) or occlusion. Left carotid system: No evidence of dissection, stenosis (50% or greater) or occlusion. Vertebral arteries: Codominant. No evidence of dissection, stenosis (50% or greater) or occlusion. Skeleton: Unremarkable. Other neck: No masses or fluid collections. Upper chest: Moderately large loculated RIGHT pleural effusion medially. Review of the MIP images confirms the above findings CTA HEAD FINDINGS Anterior circulation: No significant stenosis, proximal occlusion, aneurysm, or vascular malformation. Posterior circulation: No significant stenosis, proximal occlusion, aneurysm, or vascular malformation. Venous sinuses: As permitted by contrast timing, patent. Anatomic variants: None of significance. Delayed phase: No abnormal intracranial enhancement. Review of the MIP images confirms the above findings IMPRESSION: CTA head neck demonstrates no arterial occlusion or dissection. No acute intracranial findings. No abnormal postcontrast enhancement of the brain or meninges. Moderately large RIGHT pleural effusion medially. Consider two-view chest for further evaluation. Electronically Signed   By: Staci Righter M.D.   On: 12/05/2018 18:13   Mr Brain Wo Contrast  Result Date: 12/05/2018 CLINICAL DATA:  Altered mental status EXAM: MRI HEAD WITHOUT CONTRAST TECHNIQUE: Multiplanar, multiecho pulse sequences of the brain and surrounding structures were obtained without intravenous contrast. COMPARISON:  Head CT 12/05/2018 FINDINGS: BRAIN: There is no acute infarct, acute hemorrhage or extra-axial collection. The midline structures are normal. No midline shift or other mass effect. The white matter signal is normal for the patient's age. The cerebral and cerebellar volume are age-appropriate. No hydrocephalus. Susceptibility-sensitive sequences show no  chronic microhemorrhage or superficial siderosis. No mass lesion. VASCULAR: The major intracranial arterial and venous sinus flow voids are normal. SKULL AND UPPER CERVICAL SPINE: Calvarial bone marrow signal is normal. There is no skull base mass. Visualized upper cervical spine and soft tissues are normal. SINUSES/ORBITS: No fluid levels or advanced mucosal thickening. No mastoid or middle ear effusion. The orbits are normal. IMPRESSION: Normal brain MRI. Electronically Signed   By: Ulyses Jarred M.D.   On: 12/05/2018 21:10   Dg Chest Port 1 View  Result Date: 12/05/2018 CLINICAL DATA:  Status post mitral valve repair EXAM: PORTABLE CHEST 1 VIEW COMPARISON:  12/02/2018 FINDINGS: Redemonstrated small right pleural effusion with associated atelectasis or consolidation. No new airspace opacity. Cardiomegaly with surgical mitral valve annulus. IMPRESSION: Redemonstrated small right pleural effusion with associated atelectasis or consolidation. No new airspace opacity. Cardiomegaly with surgical mitral valve annulus. Electronically Signed   By: Eddie Candle M.D.   On: 12/05/2018 18:46     Labs:   Basic Metabolic Panel: Recent Labs  Lab 11/29/18 2000 11/30/18 0500 12/05/18 1631 12/05/18 1653 12/06/18 0621  NA 133* 135 136 134* 136  K 3.4* 3.8 4.1 4.1 4.0  CL 102 104 101 102 103  CO2 24 25 24   --  22  GLUCOSE 147* 133* 119* 115* 113*  BUN 6 7 8 9  <5*  CREATININE 0.93 0.83 0.91 0.90 0.91  CALCIUM 7.5* 7.8* 8.9  --  8.6*  MG 2.4  --   --   --   --    GFR Estimated Creatinine Clearance: 80.8  mL/min (by C-G formula based on SCr of 0.91 mg/dL). Liver Function Tests: Recent Labs  Lab 12/05/18 1631 12/06/18 0621  AST 50* 23  ALT 56* 38  ALKPHOS 103 83  BILITOT 0.4 0.4  PROT 7.1 5.9*  ALBUMIN 3.1* 2.6*   No results for input(s): LIPASE, AMYLASE in the last 168 hours. No results for input(s): AMMONIA in the last 168 hours. Coagulation profile Recent Labs  Lab 12/01/18 0311  12/02/18 0516 12/03/18 0540 12/05/18 1631 12/06/18 1142  INR 1.1 1.3* 1.5* 1.8* 1.7*    CBC: Recent Labs  Lab 11/29/18 2003 11/30/18 0500 12/05/18 1631 12/05/18 1653 12/06/18 0621  WBC 14.8* 12.0* 11.2*  --  9.9  NEUTROABS  --   --  8.8*  --  7.7  HGB 10.3* 9.4* 10.8* 11.6* 9.5*  HCT 31.4* 29.0* 33.6* 34.0* 28.9*  MCV 91.3 92.7 92.6  --  90.0  PLT 148* 125* 297  --  259   Cardiac Enzymes: Recent Labs  Lab 12/05/18 1710 12/06/18 0621 12/06/18 1142  TROPONINI 0.12* 0.15* 0.11*   BNP: Invalid input(s): POCBNP CBG: Recent Labs  Lab 11/30/18 2046 12/01/18 0029 12/01/18 0400 12/01/18 0845 12/05/18 1634  GLUCAP 109* 90 110* 107* 107*   D-Dimer No results for input(s): DDIMER in the last 72 hours. Hgb A1c No results for input(s): HGBA1C in the last 72 hours. Lipid Profile No results for input(s): CHOL, HDL, LDLCALC, TRIG, CHOLHDL, LDLDIRECT in the last 72 hours. Thyroid function studies No results for input(s): TSH, T4TOTAL, T3FREE, THYROIDAB in the last 72 hours.  Invalid input(s): FREET3 Anemia work up No results for input(s): VITAMINB12, FOLATE, FERRITIN, TIBC, IRON, RETICCTPCT in the last 72 hours. Microbiology Recent Results (from the past 240 hour(s))  Urine culture     Status: None   Collection Time: 11/27/18  1:16 PM  Result Value Ref Range Status   Specimen Description URINE, CLEAN CATCH  Final   Special Requests NONE  Final   Culture   Final    NO GROWTH Performed at [facility], [address], [place], [place] [address]    Report Status 11/28/2018 FINAL  Final  SARS Coronavirus 2 (CEPHEID - Performed in [place] hospital lab), Hosp Order     Status: None   Collection Time: 11/27/18  1:45 PM  Result Value Ref Range Status   SARS Coronavirus 2 NEGATIVE NEGATIVE Final    Comment: (NOTE) If result is NEGATIVE SARS-CoV-2 target nucleic acids are NOT DETECTED. The SARS-CoV-2 RNA is generally detectable in upper and lower  respiratory  specimens during the acute phase of infection. The lowest  concentration of SARS-CoV-2 viral copies this assay can detect is 250  copies / mL. A negative result does not preclude SARS-CoV-2 infection  and should not be used as the sole basis for treatment or other  patient management decisions.  A negative result may occur with  improper specimen collection / handling, submission of specimen other  than nasopharyngeal swab, presence of viral mutation(s) within the  areas targeted by this assay, and inadequate number of viral copies  (<250 copies / mL). A negative result must be combined with clinical  observations, patient history, and epidemiological information. If result is POSITIVE SARS-CoV-2 target nucleic acids are DETECTED. The SARS-CoV-2 RNA is generally detectable in upper and lower  respiratory specimens dur ing the acute phase of infection.  Positive  results are indicative of active infection with SARS-CoV-2.  Clinical  correlation with patient history  and other diagnostic information is  necessary to determine patient infection status.  Positive results do  not rule out bacterial infection or co-infection with other viruses. If result is PRESUMPTIVE POSTIVE SARS-CoV-2 nucleic acids MAY BE PRESENT.   A presumptive positive result was obtained on the submitted specimen  and confirmed on repeat testing.  While 2019 novel coronavirus  (SARS-CoV-2) nucleic acids may be present in the submitted sample  additional confirmatory testing may be necessary for epidemiological  and / or clinical management purposes  to differentiate between  SARS-CoV-2 and other Sarbecovirus currently known to infect humans.  If clinically indicated additional testing with an alternate test  methodology [phone]) is advised. The SARS-CoV-2 RNA is generally  detectable in upper and lower respiratory sp ecimens during the acute  phase of infection. The expected result is Negative. Fact Sheet for  Patients:  StrictlyIdeas.no Fact Sheet for Healthcare Providers: BankingDealers.co.za This test is not yet approved or cleared by the [place] FDA and has been authorized for detection and/or diagnosis of SARS-CoV-2 by FDA under an Emergency Use Authorization (EUA).  This EUA will remain in effect (meaning this test can be used) for the duration of the COVID-19 declaration under Section 564(b)(1) of the Act, 21 U.S.C. section 360bbb-3(b)(1), unless the authorization is terminated or revoked sooner. Performed at [facility], [place] [address], [place], [place] [address]   SARS Coronavirus 2 (CEPHEID - Performed in [place] hospital lab), Hosp Order     Status: None   Collection Time: 12/05/18  6:33 PM  Result Value Ref Range Status   SARS Coronavirus 2 NEGATIVE NEGATIVE Final    Comment: (NOTE) If result is NEGATIVE SARS-CoV-2 target nucleic acids are NOT DETECTED. The SARS-CoV-2 RNA is generally detectable in upper and lower  respiratory specimens during the acute phase of infection. The lowest  concentration of SARS-CoV-2 viral copies this assay can detect is 250  copies / mL. A negative result does not preclude SARS-CoV-2 infection  and should not be used as the sole basis for treatment or other  patient management decisions.  A negative result may occur with  improper specimen collection / handling, submission of specimen other  than nasopharyngeal swab, presence of viral mutation(s) within the  areas targeted by this assay, and inadequate number of viral copies  (<250 copies / mL). A negative result must be combined with clinical  observations, patient history, and epidemiological information. If result is POSITIVE SARS-CoV-2 target nucleic acids are DETECTED. The SARS-CoV-2 RNA is generally detectable in upper and lower  respiratory specimens dur ing the acute phase of infection.  Positive  results are  indicative of active infection with SARS-CoV-2.  Clinical  correlation with patient history and other diagnostic information is  necessary to determine patient infection status.  Positive results do  not rule out bacterial infection or co-infection with other viruses. If result is PRESUMPTIVE POSTIVE SARS-CoV-2 nucleic acids MAY BE PRESENT.   A presumptive positive result was obtained on the submitted specimen  and confirmed on repeat testing.  While 2019 novel coronavirus  (SARS-CoV-2) nucleic acids may be present in the submitted sample  additional confirmatory testing may be necessary for epidemiological  and / or clinical management purposes  to differentiate between  SARS-CoV-2 and other Sarbecovirus currently known to infect humans.  If clinically indicated additional testing with an alternate test  methodology [phone]) is advised. The SARS-CoV-2 RNA is generally  detectable in upper and lower respiratory sp ecimens during  the acute  phase of infection. The expected result is Negative. Fact Sheet for Patients:  StrictlyIdeas.no Fact Sheet for Healthcare Providers: BankingDealers.co.za This test is not yet approved or cleared by the [place] FDA and has been authorized for detection and/or diagnosis of SARS-CoV-2 by FDA under an Emergency Use Authorization (EUA).  This EUA will remain in effect (meaning this test can be used) for the duration of the COVID-19 declaration under Section 564(b)(1) of the Act, 21 U.S.C. section 360bbb-3(b)(1), unless the authorization is terminated or revoked sooner. Performed at [facility], [place] [address], [place],  [address]      Discharge Instructions:   Discharge Instructions    Diet - low sodium heart healthy   Complete by:  As directed    Discharge instructions   Complete by:  As directed    Be sure to use your incentive spirometer and stay hydrated Bowel regimen for  soft stools (pain medications can be constipating)- would avoid miralax- can try fiber gummies or dulcolax   Increase activity slowly   Complete by:  As directed      Allergies as of 12/06/2018   No Known Allergies     Medication List    TAKE these medications   aspirin 81 MG EC tablet Take 1 tablet (81 mg total) by mouth daily.   clindamycin 1 % external solution Commonly known as:  CLEOCIN T Apply 1 application topically 2 (two) times daily.   ferrous [id number] C-folic acid capsule Commonly known as:  Foltrin Take 1 capsule by mouth 3 (three) times daily after meals. Take for 1 month.   furosemide 20 MG tablet Commonly known as:  LASIX Take 1 tablet (20 mg total) by mouth daily as needed for edema. What changed:    when to take this  reasons to take this   metoprolol tartrate 25 MG tablet Commonly known as:  LOPRESSOR Take 1 tablet (25 mg total) by mouth 2 (two) times daily.   oxyCODONE-acetaminophen 5-325 MG tablet Commonly known as:  Percocet Take 1 tablet by mouth every 4 (four) hours as needed for up to 7 days for severe pain.   potassium chloride 10 MEQ CR capsule Commonly known as:  MICRO-K Take 2 capsules (20 mEq total) by mouth daily as needed (when taking the lasix). What changed:    how much to take  when to take this  reasons to take this   tazarotene 0.1 % cream Commonly known as:  AVAGE Apply 1 application topically at bedtime.   Ubrogepant 100 MG Tabs Commonly known as:  Ubrelvy Take 1 tablet by mouth as needed. What changed:    when to take this  reasons to take this   warfarin 5 MG tablet Commonly known as:  COUMADIN Take 1 tablet (5 mg total) by mouth daily at 6 PM. Take one tablet daily or as instructed by the [facility].      Follow-up Information    [name], MD Follow up in 1 week(s).   Specialty:  Internal Medicine Why:  INR on Monday Contact information: [address] [place] [address] [phone]            Time coordinating discharge: 25 min  Signed:  Geradine Girt DO  [facility] 12/06/2018, 4:08 PM
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
## 2018-12-06 NOTE — Progress Notes (Signed)
Pt given discharge instructions and all questions answered. Pt discharged home with mother in private vehicle.

## 2018-12-06 NOTE — Progress Notes (Signed)
Pt complained of chest pain. VSS. 12 lead EKG obtained and normal. MD notified and vicodin 1 tablet given. Pain relieved by vicodin per patient.

## 2018-12-06 NOTE — Progress Notes (Signed)
SATURATION QUALIFICATIONS: (This note is used to comply with regulatory documentation for home oxygen)  Patient Saturations on Room Air at Rest = 95%  Patient Saturations on Room Air while Ambulating = 92%    Pt will NOT need home oxygen.
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
## 2018-12-06 NOTE — Progress Notes (Addendum)
[address]       [place] [address]             [phone]     CARDIOTHORACIC SURGERY PROGRESS NOTE  Subjective: Patient is well-known to our service from her recent hospitalization for elective mitral valve repair on Nov 28, 2018.  Her postoperative recovery was uneventful and she was discharged home 3 days ago.  She describes a relatively sudden onset of disorientation and amnesia late yesterday afternoon.  She admits that since having been discharged from the hospital she has not been eating and drinking that much.  She states that the soreness in her chest has improved considerably and she had not been using much pain medicine.  Yesterday afternoon she was sitting in her garage in the hot weather when she suddenly became disoriented.  She states that she does not remember much after that until she was in the emergency department where she was receiving intravenous hydration.  Since then she reports that she is back to her normal self and she has no other significant ongoing complaints.  She specifically denies any shortness of breath.  She has mild soreness across her anterior chest which is expected from her recent surgery.  She has not had fevers or chills or new onset of cough  Objective: Vital signs in last 24 hours: Temp:  [98.4 F (36.9 C)-99.7 F (37.6 C)] 98.7 F (37.1 C) (06/04 1100) Pulse Rate:  [99-129] 99 (06/04 1149) Cardiac Rhythm: Sinus tachycardia (06/04 1144) Resp:  [15-27] 27 (06/04 1149) BP: (102-135)/(67-97) 112/78 (06/04 1149) SpO2:  [95 %-100 %] 96 % (06/04 1149) Weight:  [83.7 kg-84.2 kg] 83.7 kg (06/04 0420)  Physical Exam:  Rhythm:   Sinus tach  Breath sounds: Clear  Heart sounds:  Regular rate and rhythm without murmur  Incisions:  Healing nicely  Abdomen:  Soft nontender  Extremities:  Warm and well-perfused   Intake/Output from previous day: No intake/output data recorded. Intake/Output this shift: Total I/O In: 600  [P.O.:600] Out: -   Lab Results: Recent Labs    12/05/18 1631 12/05/18 1653 12/06/18 0621  WBC 11.2*  --  9.9  HGB 10.8* 11.6* 9.5*  HCT 33.6* 34.0* 28.9*  PLT 297  --  259   BMET:  Recent Labs    12/05/18 1631 12/05/18 1653 12/06/18 0621  NA 136 134* 136  K 4.1 4.1 4.0  CL 101 102 103  CO2 24  --  22  GLUCOSE 119* 115* 113*  BUN 8 9 <5*  CREATININE 0.91 0.90 0.91  CALCIUM 8.9  --  8.6*    CBG (last 3)  Recent Labs    12/05/18 1634  GLUCAP 107*   PT/INR:   Recent Labs    12/06/18 1142  LABPROT 19.6*  INR 1.7*    CXR:  CHEST - 2 VIEW  COMPARISON:  12/05/2018  FINDINGS: Cardiac shadow is stable. Postsurgical changes are again seen. Small right pleural effusion is again identified and stable. Mild atelectatic changes are seen. No pneumothorax is noted.  IMPRESSION: Small right pleural effusion and basilar atelectasis.   Electronically Signed   By: Inez Catalina M.D.   On: 12/06/2018 08:28  CT ANGIOGRAPHY HEAD AND NECK  TECHNIQUE: Multidetector CT imaging of the head and neck was performed using the standard protocol during bolus administration of intravenous contrast. Multiplanar CT image reconstructions and MIPs were obtained to evaluate the vascular anatomy. Carotid stenosis measurements (when applicable) are  obtained utilizing NASCET criteria, using the distal internal carotid diameter as the denominator.  CONTRAST:  62mL OMNIPAQUE IOHEXOL 350 MG/ML SOLN  COMPARISON:  MR brain 10/26/2015.  FINDINGS: CT HEAD FINDINGS  Brain: No evidence of acute infarction, hemorrhage, hydrocephalus, extra-axial collection or mass lesion/mass effect.  Vascular: Reported separately  Skull: Normal. Negative for fracture or focal lesion.  Sinuses: Imaged portions are clear.  Orbits: No acute finding.  Review of the MIP images confirms the above findings  CTA NECK FINDINGS  Aortic arch: Standard branching. Imaged portion shows no  evidence of aneurysm or dissection. No significant stenosis of the major arch vessel origins.  Right carotid system: No evidence of dissection, stenosis (50% or greater) or occlusion.  Left carotid system: No evidence of dissection, stenosis (50% or greater) or occlusion.  Vertebral arteries: Codominant. No evidence of dissection, stenosis (50% or greater) or occlusion.  Skeleton: Unremarkable.  Other neck: No masses or fluid collections.  Upper chest: Moderately large loculated RIGHT pleural effusion medially.  Review of the MIP images confirms the above findings  CTA HEAD FINDINGS  Anterior circulation: No significant stenosis, proximal occlusion, aneurysm, or vascular malformation.  Posterior circulation: No significant stenosis, proximal occlusion, aneurysm, or vascular malformation.  Venous sinuses: As permitted by contrast timing, patent.  Anatomic variants: None of significance.  Delayed phase: No abnormal intracranial enhancement.  Review of the MIP images confirms the above findings  IMPRESSION: CTA head neck demonstrates no arterial occlusion or dissection. No acute intracranial findings.  No abnormal postcontrast enhancement of the brain or meninges.  Moderately large RIGHT pleural effusion medially. Consider two-view chest for further evaluation.   Electronically Signed   By: Staci Righter M.D.   On: 12/05/2018 18:13   MRI HEAD WITHOUT CONTRAST  TECHNIQUE: Multiplanar, multiecho pulse sequences of the brain and surrounding structures were obtained without intravenous contrast.  COMPARISON:  Head CT 12/05/2018  FINDINGS: BRAIN: There is no acute infarct, acute hemorrhage or extra-axial collection. The midline structures are normal. No midline shift or other mass effect. The white matter signal is normal for the patient's age. The cerebral and cerebellar volume are age-appropriate. No hydrocephalus.  Susceptibility-sensitive sequences show no chronic microhemorrhage or superficial siderosis. No mass lesion.  VASCULAR: The major intracranial arterial and venous sinus flow voids are normal.  SKULL AND UPPER CERVICAL SPINE: Calvarial bone marrow signal is normal. There is no skull base mass. Visualized upper cervical spine and soft tissues are normal.  SINUSES/ORBITS: No fluid levels or advanced mucosal thickening. No mastoid or middle ear effusion. The orbits are normal.  IMPRESSION: Normal brain MRI.   Electronically Signed   By: Ulyses Jarred M.D.   On: 12/05/2018 21:10   Assessment/Plan:   Etiology of the patient's transient episode of generalized confusion and disorientation remains unclear.  It is possible she may have been relatively dehydrated at the time.  Clinically she looks quite good at this point in time.  Slightly elevated troponin levels this early after cardiac surgery are expected and irrelevant.  I have personally reviewed the patient's chest x-ray which demonstrates a small right pleural effusion that has perhaps slightly increased in comparison with her most recent x-ray performed prior to recent hospital discharge.  This effusion is not large enough to warrant any further intervention at this time.  I think it would be reasonable to let her go home tomorrow if she has no further problems overnight.  We will plan another follow-up x-ray as an outpatient to make  sure that her pleural effusion resolves.  I would not recommend continuing a diuretic at the time of hospital discharge.  She should remain on metoprolol and Coumadin.   [name], MD 12/06/2018 4:15 PM

## 2018-12-07 ENCOUNTER — Telehealth: Payer: Self-pay | Admitting: *Deleted

## 2018-12-07 NOTE — Telephone Encounter (Signed)
Pt was on TCM report she present to ED with complaints of being confuse. Pt was admitted for observation 12/05/18.   EKG features sinus tachycardia with rate 126 and PAC.  Chest x-ray is notable for small right pleural effusion with atelectasis or consolidation.  CTA head and neck is negative for arterial occlusion or dissection, negative for acute intracranial abnormality, but notable for moderately large right-sided pleural effusion medially.  Pt has a hosp f/u already scheduled for 6/11,and was told to keep appt.Marland KitchenJohny Chess

## 2018-12-10 ENCOUNTER — Encounter: Payer: Self-pay | Admitting: Internal Medicine

## 2018-12-10 ENCOUNTER — Other Ambulatory Visit (INDEPENDENT_AMBULATORY_CARE_PROVIDER_SITE_OTHER): Payer: 59

## 2018-12-10 ENCOUNTER — Ambulatory Visit: Payer: 59 | Admitting: Thoracic Surgery (Cardiothoracic Vascular Surgery)

## 2018-12-10 ENCOUNTER — Ambulatory Visit (INDEPENDENT_AMBULATORY_CARE_PROVIDER_SITE_OTHER): Payer: 59 | Admitting: Internal Medicine

## 2018-12-10 ENCOUNTER — Telehealth: Payer: Self-pay | Admitting: Adult Health

## 2018-12-10 ENCOUNTER — Other Ambulatory Visit: Payer: Self-pay

## 2018-12-10 VITALS — BP 118/78 | HR 110 | Temp 98.2°F | Resp 16 | Ht 63.0 in | Wt 182.0 lb

## 2018-12-10 DIAGNOSIS — D539 Nutritional anemia, unspecified: Secondary | ICD-10-CM

## 2018-12-10 DIAGNOSIS — D508 Other iron deficiency anemias: Secondary | ICD-10-CM | POA: Diagnosis not present

## 2018-12-10 DIAGNOSIS — I341 Nonrheumatic mitral (valve) prolapse: Secondary | ICD-10-CM

## 2018-12-10 DIAGNOSIS — Z9889 Other specified postprocedural states: Secondary | ICD-10-CM | POA: Diagnosis not present

## 2018-12-10 DIAGNOSIS — Z7901 Long term (current) use of anticoagulants: Secondary | ICD-10-CM

## 2018-12-10 LAB — CBC WITH DIFFERENTIAL/PLATELET
Basophils Absolute: 0.2 10*3/uL — ABNORMAL HIGH (ref 0.0–0.1)
Basophils Relative: 1.5 % (ref 0.0–3.0)
Eosinophils Absolute: 0.1 10*3/uL (ref 0.0–0.7)
Eosinophils Relative: 0.8 % (ref 0.0–5.0)
HCT: 31.9 % — ABNORMAL LOW (ref 36.0–46.0)
Hemoglobin: 10.6 g/dL — ABNORMAL LOW (ref 12.0–15.0)
Lymphocytes Relative: 9.7 % — ABNORMAL LOW (ref 12.0–46.0)
Lymphs Abs: 1 10*3/uL (ref 0.7–4.0)
MCHC: 33.1 g/dL (ref 30.0–36.0)
MCV: 91 fl (ref 78.0–100.0)
Monocytes Absolute: 0.6 10*3/uL (ref 0.1–1.0)
Monocytes Relative: 5.9 % (ref 3.0–12.0)
Neutro Abs: 8.5 10*3/uL — ABNORMAL HIGH (ref 1.4–7.7)
Neutrophils Relative %: 82.1 % — ABNORMAL HIGH (ref 43.0–77.0)
Platelets: 366 10*3/uL (ref 150.0–400.0)
RBC: 3.5 Mil/uL — ABNORMAL LOW (ref 3.87–5.11)
RDW: 13.6 % (ref 11.5–15.5)
WBC: 10.4 10*3/uL (ref 4.0–10.5)

## 2018-12-10 LAB — IBC PANEL
Iron: 43 ug/dL (ref 42–145)
Saturation Ratios: 16.6 % — ABNORMAL LOW (ref 20.0–50.0)
Transferrin: 185 mg/dL — ABNORMAL LOW (ref 212.0–360.0)

## 2018-12-10 LAB — PROTIME-INR
INR: 3.2 ratio — ABNORMAL HIGH (ref 0.8–1.0)
Prothrombin Time: 36.5 s — ABNORMAL HIGH (ref 9.6–13.1)

## 2018-12-10 LAB — FERRITIN: Ferritin: 121.7 ng/mL (ref 10.0–291.0)

## 2018-12-10 LAB — VITAMIN B12: Vitamin B-12: 929 pg/mL — ABNORMAL HIGH (ref 211–911)

## 2018-12-10 LAB — FOLATE: Folate: 9.6 ng/mL (ref >5.9)

## 2018-12-10 MED ORDER — FERROUS SULFATE 325 (65 FE) MG PO TABS: 325.0000 mg | ORAL_TABLET | Freq: Two times a day (BID) | ORAL | 0 refills | Status: DC

## 2018-12-10 MED ORDER — WARFARIN SODIUM 4 MG PO TABS: 4.0000 mg | ORAL_TABLET | Freq: Every day | ORAL | 1 refills | Status: DC

## 2018-12-10 NOTE — Progress Notes (Signed)
Subjective:  Patient ID: [name], female    DOB: February 06, 1976  Age: 43 y.o. 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  She states her mental status is back to normal.  She was found to be anemic but tells me she is not taking any vitamin supplements and is not aware of any sources of blood loss.  She does not have menstrual cycles.  She complains of lightheadedness and shortness of breath but she denies chest pain, fatigue, palpitations, edema, or paresthesias.  Outpatient Medications Prior to Visit  Medication Sig Dispense Refill   aspirin EC 81 MG EC tablet Take 1 tablet (81 mg total) by mouth daily.     clindamycin (CLEOCIN T) 1 % external solution Apply 1 application topically 2 (two) times daily.   1   ferrous [id number] C-folic acid (FOLTRIN) capsule Take 1 capsule by mouth 3 (three) times daily after meals. Take for 1 month. 100 capsule 0   metoprolol tartrate (LOPRESSOR) 25 MG tablet Take 1 tablet (25 mg total) by mouth 2 (two) times daily. 60 tablet 2   oxyCODONE-acetaminophen (PERCOCET) 5-325 MG tablet Take 1 tablet by mouth every 4 (four) hours as needed for up to 7 days for severe pain. 30 tablet 0   tazarotene (AVAGE) 0.1 % cream Apply 1 application topically at bedtime.   2   Ubrogepant (UBRELVY) 100 MG TABS Take 1 tablet by mouth as needed. (Patient taking differently: Take 1 tablet by mouth daily as needed (migraine). ) 10 tablet 5   warfarin (COUMADIN) 5 MG tablet Take 1 tablet (5 mg total) by mouth daily at 6 PM. Take one tablet daily or as instructed by the [facility]. 30 tablet 2   No facility-administered medications prior to visit.     ROS Review of Systems  Constitutional: Negative.  Negative for diaphoresis and fatigue.  HENT: Negative for nosebleeds.   Eyes:  Negative.   Respiratory: Negative for cough, chest tightness and shortness of breath.   Cardiovascular: Negative for chest pain, palpitations and leg swelling.  Gastrointestinal: Negative for abdominal pain, blood in stool, constipation, diarrhea, nausea and vomiting.  Endocrine: Negative.   Genitourinary: Negative.  Negative for hematuria and vaginal bleeding.  Musculoskeletal: Negative.  Negative for arthralgias and myalgias.  Skin: Negative for color change and pallor.  Neurological: Positive for dizziness and light-headedness. Negative for weakness and numbness.  Hematological: Negative for adenopathy. Does not bruise/bleed easily.  Psychiatric/Behavioral: Negative.  Negative for agitation, confusion, decreased concentration, dysphoric mood, sleep disturbance and suicidal ideas. The patient is not nervous/anxious.     Objective:  BP 118/78 (BP Location: Left Arm, Patient Position: Sitting, Cuff Size: Normal)    Pulse (!) 110    Temp 98.2 F (36.8 C) (Oral)    Resp 16    Ht 5\' 3"  (1.6 m)    Wt 182 lb (82.6 kg)    LMP 01/12/2012    SpO2 99%    BMI 32.24 kg/m   BP Readings from Last 3 Encounters:  12/10/18 118/78  12/06/18 112/78  12/03/18 108/69    Wt Readings from Last 3 Encounters:  12/10/18 182 lb (82.6 kg)  12/06/18 184 lb 8.4 oz (83.7 kg)  12/03/18 182 lb 8 oz (82.8 kg)    Physical Exam Vitals signs reviewed.  Constitutional:      Appearance: She is not ill-appearing or diaphoretic.  HENT:     Nose: Nose normal.     Mouth/Throat:     Mouth: Mucous membranes are moist.  Eyes:     General: No scleral icterus.    Conjunctiva/sclera: Conjunctivae normal.  Neck:     Musculoskeletal: Normal range of motion. No neck rigidity.  Cardiovascular:     Rate and Rhythm: Regular rhythm. Tachycardia present.     Heart sounds: S1 normal and S2 normal. No friction rub. No gallop.   Pulmonary:     Breath sounds: No stridor. No wheezing, rhonchi or rales.  Abdominal:     General:  Abdomen is flat.     Palpations: There is no hepatomegaly or splenomegaly.     Tenderness: There is no abdominal tenderness.  Musculoskeletal:     Right lower leg: No edema.     Left lower leg: No edema.  Lymphadenopathy:     Cervical: No cervical adenopathy.  Skin:    General: Skin is warm.     Coloration: Skin is not pale.  Neurological:     General: No focal deficit present.     Mental Status: Mental status is at baseline.  Psychiatric:        Mood and Affect: Mood normal.        Behavior: Behavior normal.     Lab Results  Component Value Date   WBC 10.4 12/10/2018   HGB 10.6 (L) 12/10/2018   HCT 31.9 (L) 12/10/2018   PLT 366.0 12/10/2018   GLUCOSE 113 (H) 12/06/2018   CHOL 179 07/10/2018   TRIG 142.0 07/10/2018   HDL 54.70 07/10/2018   LDLCALC 96 07/10/2018   ALT 38 12/06/2018   AST 23 12/06/2018   NA 136 12/06/2018   K 4.0 12/06/2018   CL 103 12/06/2018   CREATININE 0.91 12/06/2018   BUN <5 (L) 12/06/2018   CO2 22 12/06/2018   TSH 1.48 10/30/2018   INR 3.2 (H) 12/10/2018   HGBA1C 5.5 11/19/2018    Ct Angio Head W Or Wo Contrast  Result Date: 12/05/2018 CLINICAL DATA:  Last seen normal at 1500 hours today. Patient amnestic after shower. EXAM: CT ANGIOGRAPHY HEAD AND NECK TECHNIQUE: Multidetector CT imaging of the head and neck was performed using the standard protocol during bolus administration of intravenous contrast. Multiplanar CT image reconstructions and MIPs were obtained to evaluate the vascular anatomy. Carotid stenosis measurements (when applicable) are obtained utilizing NASCET criteria, using the distal internal carotid diameter as the denominator. CONTRAST:  57mL OMNIPAQUE IOHEXOL 350 MG/ML SOLN COMPARISON:  MR brain 10/26/2015. FINDINGS: CT HEAD FINDINGS Brain: No evidence of acute infarction, hemorrhage, hydrocephalus, extra-axial collection or mass lesion/mass effect. Vascular: Reported separately Skull: Normal. Negative for fracture or focal lesion.  Sinuses: Imaged portions are clear. Orbits: No acute finding. Review of the MIP images confirms the above findings CTA NECK FINDINGS Aortic arch: Standard branching. Imaged portion shows no evidence of aneurysm or dissection. No significant stenosis of the major arch vessel origins. Right carotid system: No evidence of dissection, stenosis (50% or greater) or occlusion. Left carotid system: No evidence of dissection, stenosis (50% or greater) or occlusion. Vertebral arteries: Codominant. No evidence of dissection, stenosis (50% or greater) or occlusion. Skeleton: Unremarkable. Other neck: No masses or fluid collections. Upper chest: Moderately large loculated RIGHT pleural effusion medially. Review of the MIP images confirms the above findings CTA HEAD FINDINGS Anterior circulation: No significant stenosis, proximal  occlusion, aneurysm, or vascular malformation. Posterior circulation: No significant stenosis, proximal occlusion, aneurysm, or vascular malformation. Venous sinuses: As permitted by contrast timing, patent. Anatomic variants: None of significance. Delayed phase: No abnormal intracranial enhancement. Review of the MIP images confirms the above findings IMPRESSION: CTA head neck demonstrates no arterial occlusion or dissection. No acute intracranial findings. No abnormal postcontrast enhancement of the brain or meninges. Moderately large RIGHT pleural effusion medially. Consider two-view chest for further evaluation. Electronically Signed   By: Staci Righter M.D.   On: 12/05/2018 18:13   Dg Chest 2 View  Result Date: 12/06/2018 CLINICAL DATA:  Follow-up right-sided pleural effusion EXAM: CHEST - 2 VIEW COMPARISON:  12/05/2018 FINDINGS: Cardiac shadow is stable. Postsurgical changes are again seen. Small right pleural effusion is again identified and stable. Mild atelectatic changes are seen. No pneumothorax is noted. IMPRESSION: Small right pleural effusion and basilar atelectasis. Electronically  Signed   By: Inez Catalina M.D.   On: 12/06/2018 08:28   Ct Angio Neck W And/or Wo Contrast  Result Date: 12/05/2018 CLINICAL DATA:  Last seen normal at 1500 hours today. Patient amnestic after shower. EXAM: CT ANGIOGRAPHY HEAD AND NECK TECHNIQUE: Multidetector CT imaging of the head and neck was performed using the standard protocol during bolus administration of intravenous contrast. Multiplanar CT image reconstructions and MIPs were obtained to evaluate the vascular anatomy. Carotid stenosis measurements (when applicable) are obtained utilizing NASCET criteria, using the distal internal carotid diameter as the denominator. CONTRAST:  58mL OMNIPAQUE IOHEXOL 350 MG/ML SOLN COMPARISON:  MR brain 10/26/2015. FINDINGS: CT HEAD FINDINGS Brain: No evidence of acute infarction, hemorrhage, hydrocephalus, extra-axial collection or mass lesion/mass effect. Vascular: Reported separately Skull: Normal. Negative for fracture or focal lesion. Sinuses: Imaged portions are clear. Orbits: No acute finding. Review of the MIP images confirms the above findings CTA NECK FINDINGS Aortic arch: Standard branching. Imaged portion shows no evidence of aneurysm or dissection. No significant stenosis of the major arch vessel origins. Right carotid system: No evidence of dissection, stenosis (50% or greater) or occlusion. Left carotid system: No evidence of dissection, stenosis (50% or greater) or occlusion. Vertebral arteries: Codominant. No evidence of dissection, stenosis (50% or greater) or occlusion. Skeleton: Unremarkable. Other neck: No masses or fluid collections. Upper chest: Moderately large loculated RIGHT pleural effusion medially. Review of the MIP images confirms the above findings CTA HEAD FINDINGS Anterior circulation: No significant stenosis, proximal occlusion, aneurysm, or vascular malformation. Posterior circulation: No significant stenosis, proximal occlusion, aneurysm, or vascular malformation. Venous sinuses: As  permitted by contrast timing, patent. Anatomic variants: None of significance. Delayed phase: No abnormal intracranial enhancement. Review of the MIP images confirms the above findings IMPRESSION: CTA head neck demonstrates no arterial occlusion or dissection. No acute intracranial findings. No abnormal postcontrast enhancement of the brain or meninges. Moderately large RIGHT pleural effusion medially. Consider two-view chest for further evaluation. Electronically Signed   By: Staci Righter M.D.   On: 12/05/2018 18:13   Mr Brain Wo Contrast  Result Date: 12/05/2018 CLINICAL DATA:  Altered mental status EXAM: MRI HEAD WITHOUT CONTRAST TECHNIQUE: Multiplanar, multiecho pulse sequences of the brain and surrounding structures were obtained without intravenous contrast. COMPARISON:  Head CT 12/05/2018 FINDINGS: BRAIN: There is no acute infarct, acute hemorrhage or extra-axial collection. The midline structures are normal. No midline shift or other mass effect. The white matter signal is normal for the patient's age. The cerebral and cerebellar volume are age-appropriate. No hydrocephalus. Susceptibility-sensitive sequences show no chronic microhemorrhage  or superficial siderosis. No mass lesion. VASCULAR: The major intracranial arterial and venous sinus flow voids are normal. SKULL AND UPPER CERVICAL SPINE: Calvarial bone marrow signal is normal. There is no skull base mass. Visualized upper cervical spine and soft tissues are normal. SINUSES/ORBITS: No fluid levels or advanced mucosal thickening. No mastoid or middle ear effusion. The orbits are normal. IMPRESSION: Normal brain MRI. Electronically Signed   By: Ulyses Jarred M.D.   On: 12/05/2018 21:10   Dg Chest Port 1 View  Result Date: 12/05/2018 CLINICAL DATA:  Status post mitral valve repair EXAM: PORTABLE CHEST 1 VIEW COMPARISON:  12/02/2018 FINDINGS: Redemonstrated small right pleural effusion with associated atelectasis or consolidation. No new airspace  opacity. Cardiomegaly with surgical mitral valve annulus. IMPRESSION: Redemonstrated small right pleural effusion with associated atelectasis or consolidation. No new airspace opacity. Cardiomegaly with surgical mitral valve annulus. Electronically Signed   By: Eddie Candle M.D.   On: 12/05/2018 18:46    Assessment & Plan:   Vashon was seen today for anemia.  Diagnoses and all orders for this visit:  Deficiency anemia- Her H&H have improved.  Her iron level is borderline low.  Her folate and B12 levels are normal. -     CBC with Differential/Platelet; Future -     IBC panel; Future -     Folate; Future -     Ferritin; Future -     Vitamin B12; Future -     ferrous sulfate 325 (65 FE) MG tablet; Take 1 tablet (325 mg total) by mouth 2 (two) times daily with a meal.  Anticoagulant long-term use- Her INR is up to 3.2 so I have asked her to decrease her warfarin dose from 5 mg a day to 4 mg a day. -     Protime-INR; Future  Iron deficiency anemia secondary to inadequate dietary iron intake -     ferrous sulfate 325 (65 FE) MG tablet; Take 1 tablet (325 mg total) by mouth 2 (two) times daily with a meal.   I am having [name] start on ferrous sulfate. I am also having her maintain her clindamycin, tazarotene, Ubrogepant, aspirin, metoprolol tartrate, warfarin, oxyCODONE-acetaminophen, and ferrous [id number] C-folic acid.  Meds ordered this encounter  Medications   ferrous sulfate 325 (65 FE) MG tablet    Sig: Take 1 tablet (325 mg total) by mouth 2 (two) times daily with a meal.    Dispense:  180 tablet    Refill:  0     Follow-up: Return in about 3 months (around 03/12/2019).  [name], MD

## 2018-12-10 NOTE — Telephone Encounter (Signed)
Phone visit/call [phone]/my chart/pre reg complete/consent obtained -- ttf

## 2018-12-10 NOTE — Patient Instructions (Signed)
Anemia  Anemia is a condition in which you do not have enough red blood cells or hemoglobin. Hemoglobin is a substance in red blood cells that carries oxygen. When you do not have enough red blood cells or hemoglobin (are anemic), your body cannot get enough oxygen and your organs may not work properly. As a result, you may feel very tired or have other problems. What are the causes? Common causes of anemia include:  Excessive bleeding. Anemia can be caused by excessive bleeding inside or outside the body, including bleeding from the intestine or from periods in women.  Poor nutrition.  Long-lasting (chronic) kidney, thyroid, and liver disease.  Bone marrow disorders.  Cancer and treatments for cancer.  HIV (human immunodeficiency virus) and AIDS (acquired immunodeficiency syndrome).  Treatments for HIV and AIDS.  Spleen problems.  Blood disorders.  Infections, medicines, and autoimmune disorders that destroy red blood cells. What are the signs or symptoms? Symptoms of this condition include:  Minor weakness.  Dizziness.  Headache.  Feeling heartbeats that are irregular or faster than normal (palpitations).  Shortness of breath, especially with exercise.  Paleness.  Cold sensitivity.  Indigestion.  Nausea.  Difficulty sleeping.  Difficulty concentrating. Symptoms may occur suddenly or develop slowly. If your anemia is mild, you may not have symptoms. How is this diagnosed? This condition is diagnosed based on:  Blood tests.  Your medical history.  A physical exam.  Bone marrow biopsy. Your health care provider may also check your stool (feces) for blood and may do additional testing to look for the cause of your bleeding. You may also have other tests, including:  Imaging tests, such as a CT scan or MRI.  Endoscopy.  Colonoscopy. How is this treated? Treatment for this condition depends on the cause. If you continue to lose a lot of blood, you may  need to be treated at a hospital. Treatment may include:  Taking supplements of iron, vitamin S31, or folic acid.  Taking a hormone medicine (erythropoietin) that can help to stimulate red blood cell growth.  Having a blood transfusion. This may be needed if you lose a lot of blood.  Making changes to your diet.  Having surgery to remove your spleen. Follow these instructions at home:  Take over-the-counter and prescription medicines only as told by your health care provider.  Take supplements only as told by your health care provider.  Follow any diet instructions that you were given.  Keep all follow-up visits as told by your health care provider. This is important. Contact a health care provider if:  You develop new bleeding anywhere in the body. Get help right away if:  You are very weak.  You are short of breath.  You have pain in your abdomen or chest.  You are dizzy or feel faint.  You have trouble concentrating.  You have bloody or black, tarry stools.  You vomit repeatedly or you vomit up blood. Summary  Anemia is a condition in which you do not have enough red blood cells or enough of a substance in your red blood cells that carries oxygen (hemoglobin).  Symptoms may occur suddenly or develop slowly.  If your anemia is mild, you may not have symptoms.  This condition is diagnosed with blood tests as well as a medical history and physical exam. Other tests may be needed.  Treatment for this condition depends on the cause of the anemia. This information is not intended to replace advice given to you by  your health care provider. Make sure you discuss any questions you have with your health care provider. Document Released: 07/28/2004 Document Revised: 07/22/2016 Document Reviewed: 07/22/2016 Elsevier Interactive Patient Education  2019 [employer].

## 2018-12-12 ENCOUNTER — Encounter: Payer: Self-pay | Admitting: Thoracic Surgery (Cardiothoracic Vascular Surgery)

## 2018-12-13 ENCOUNTER — Other Ambulatory Visit: Payer: Self-pay

## 2018-12-13 ENCOUNTER — Ambulatory Visit (INDEPENDENT_AMBULATORY_CARE_PROVIDER_SITE_OTHER): Payer: Self-pay | Admitting: *Deleted

## 2018-12-13 DIAGNOSIS — Z9889 Other specified postprocedural states: Secondary | ICD-10-CM

## 2018-12-13 DIAGNOSIS — Z4802 Encounter for removal of sutures: Secondary | ICD-10-CM

## 2018-12-13 DIAGNOSIS — Z4889 Encounter for other specified surgical aftercare: Secondary | ICD-10-CM

## 2018-12-13 NOTE — Progress Notes (Signed)
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  She called yesterday stating there was a problem with the area under her right breast.  It was open and draining.  I suggested she come today.  On exam, the chest tube sites show fatty necrosis and the sites are  superficially open.  The sutures were removed as they were not very effective at this point.  The right axillary sq incision is well healed.  I instructed her in wet-dry NS daily dressings to the two previous chest tube sites.  She will continue this until she returns as scheduled.  She will notify us if the area becomes reddened or drains frank pus.

## 2018-12-14 ENCOUNTER — Other Ambulatory Visit: Payer: Self-pay | Admitting: Thoracic Surgery (Cardiothoracic Vascular Surgery)

## 2018-12-14 DIAGNOSIS — Z9889 Other specified postprocedural states: Secondary | ICD-10-CM

## 2018-12-15 ENCOUNTER — Encounter: Payer: Self-pay | Admitting: Thoracic Surgery (Cardiothoracic Vascular Surgery)
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
## 2018-12-17 ENCOUNTER — Ambulatory Visit
Admission: RE | Admit: 2018-12-17 | Discharge: 2018-12-17 | Disposition: A | Discharge status: Home / Self Care | Payer: 59 | Source: Ambulatory Visit | Attending: Thoracic Surgery (Cardiothoracic Vascular Surgery) | Admitting: Thoracic Surgery (Cardiothoracic Vascular Surgery)

## 2018-12-17 ENCOUNTER — Ambulatory Visit (INDEPENDENT_AMBULATORY_CARE_PROVIDER_SITE_OTHER): Payer: Self-pay | Admitting: Thoracic Surgery (Cardiothoracic Vascular Surgery)

## 2018-12-17 ENCOUNTER — Other Ambulatory Visit: Payer: Self-pay | Admitting: *Deleted

## 2018-12-17 ENCOUNTER — Ambulatory Visit: Payer: 59 | Admitting: Thoracic Surgery (Cardiothoracic Vascular Surgery)

## 2018-12-17 ENCOUNTER — Other Ambulatory Visit: Payer: Self-pay

## 2018-12-17 ENCOUNTER — Encounter: Payer: Self-pay | Admitting: Thoracic Surgery (Cardiothoracic Vascular Surgery)

## 2018-12-17 ENCOUNTER — Telehealth (INDEPENDENT_AMBULATORY_CARE_PROVIDER_SITE_OTHER): Payer: 59 | Admitting: Adult Health

## 2018-12-17 VITALS — BP 119/89 | HR 121 | Ht 63.0 in | Wt 178.0 lb

## 2018-12-17 VITALS — BP 119/89 | HR 121 | Temp 97.3°F | Resp 16 | Ht 63.0 in | Wt 182.0 lb

## 2018-12-17 DIAGNOSIS — Z9889 Other specified postprocedural states: Secondary | ICD-10-CM

## 2018-12-17 DIAGNOSIS — I10 Essential (primary) hypertension: Secondary | ICD-10-CM

## 2018-12-17 DIAGNOSIS — I341 Nonrheumatic mitral (valve) prolapse: Secondary | ICD-10-CM

## 2018-12-17 DIAGNOSIS — G454 Transient global amnesia: Secondary | ICD-10-CM

## 2018-12-17 DIAGNOSIS — Z7901 Long term (current) use of anticoagulants: Secondary | ICD-10-CM

## 2018-12-17 MED ORDER — METOPROLOL TARTRATE 50 MG PO TABS: 50.0000 mg | ORAL_TABLET | Freq: Two times a day (BID) | ORAL | 3 refills | Status: DC

## 2018-12-17 MED ORDER — TRAMADOL HCL 50 MG PO TABS: 50.0000 mg | ORAL_TABLET | Freq: Four times a day (QID) | ORAL | 0 refills | Status: AC | PRN

## 2018-12-17 MED ORDER — METOPROLOL TARTRATE 25 MG PO TABS: 50.0000 mg | ORAL_TABLET | Freq: Two times a day (BID) | ORAL | 2 refills | Status: DC

## 2018-12-17 NOTE — Progress Notes (Signed)
[address]       [place] [address]             [phone]     CARDIOTHORACIC SURGERY OFFICE NOTE  Referring Provider is [name], MD Primary Cardiologist is No primary care provider on file. PCP is [name], MD   HPI:  Patient is a 43 year old moderately obese African-American female who returns to the office today for routine follow-up status post minimally invasive mitral valve repair on Nov 28, 2018 for mitral valve prolapse with severe symptomatic primary mitral regurgitation.  Intraoperative findings were notable for the presence of primarily type I leaflet dysfunction and mild type II dysfunction including severe annular dilatation and bileaflet billowing but only mild prolapse.  Intraoperative TEE revealed no residual mitral regurgitation after mitral valve repair.  The patient's early postoperative recovery was uneventful and she was discharged home on the fourth postoperative day.  She was readmitted to the hospital several days later following a transient episode of amnesia.  She underwent a comprehensive neurologic evaluation without any significant findings and specifically without any evidence for embolic stroke.  Symptoms resolved quickly and have not recurred.  She returns her office today and reports that she is doing very well.  She has mild residual soreness in her chest.  She reports that her breathing is already better than it was prior to surgery.  She has not been walking too much but she has been gradually increasing without any problems.  She has not had any fevers, chills, or productive cough.  She is sleeping well at night.  She denies any tachycardia palpitations or dizzy spells.  Her prothrombin time has been checked and Coumadin has been managed through the Coumadin clinic.   Current Outpatient Medications  Medication Sig Dispense Refill  . aspirin EC 81 MG EC tablet Take 1 tablet (81 mg total) by mouth daily.    .  clindamycin (CLEOCIN T) 1 % external solution Apply 1 application topically 2 (two) times daily.   1  . ferrous [id number] C-folic acid (FOLTRIN) capsule Take 1 capsule by mouth 3 (three) times daily after meals. Take for 1 month. 100 capsule 0  . ferrous sulfate 325 (65 FE) MG tablet Take 1 tablet (325 mg total) by mouth 2 (two) times daily with a meal. 180 tablet 0  . metoprolol tartrate (LOPRESSOR) 25 MG tablet Take 1 tablet (25 mg total) by mouth 2 (two) times daily. 60 tablet 2  . tazarotene (AVAGE) 0.1 % cream Apply 1 application topically at bedtime.   2  . Ubrogepant (UBRELVY) 100 MG TABS Take 1 tablet by mouth as needed. (Patient taking differently: Take 1 tablet by mouth daily as needed (migraine). ) 10 tablet 5  . warfarin (COUMADIN) 4 MG tablet Take 1 tablet (4 mg total) by mouth daily. 30 tablet 1   No current facility-administered medications for this visit.       Physical Exam:   BP 119/89 (BP Location: Right Arm, Patient Position: Sitting, Cuff Size: Normal)   Pulse (!) 121   Temp (!) 97.3 F (36.3 C) (Other (Comment)) Comment (Src): thermal  Resp 16   Ht 5\' 3"  (1.6 m)   Wt 182 lb (82.6 kg)   LMP 01/12/2012   SpO2 97%   BMI 32.24 kg/m   General:  Well-appearing  Chest:   Clear to auscultation with symmetrical breath sounds  CV:   Tachycardic with regular rate and rhythm, no  murmurs appreciated  Incisions:  Healing nicely, 1 of 2 chest tube incisions is still open but clean and healing appropriately  Abdomen:  Soft nontender  Extremities:  Warm and well-perfused  Diagnostic Tests:  CHEST - 2 VIEW  COMPARISON:  12/06/2018  FINDINGS: Heart size is within normal limits. Prosthetic mitral valve is noted. Right pleural effusion is resolved since previous study. There is mild residual pleural-parenchymal scarring and volume loss in the right lung base. No evidence of pulmonary infiltrate or edema.  IMPRESSION: Resolution of right pleural effusion,  with residual right basilar pleural-parenchymal scarring.  No active cardiopulmonary disease.   Electronically Signed   By: Earle Gell M.D.   On: 12/17/2018 10:09   Impression:  Patient is doing well less than 1 month status post minimally invasive mitral valve repair  Plan:  I have encouraged the patient to continue to gradually increase her physical activity as tolerated.  I instructed her to increase her dose of metoprolol to 50 mg by mouth twice daily.  We have otherwise not recommended any changes to her current medications.  I did give her prescription for tramadol to use as necessary for pain.  The patient has been reminded regarding the importance of dental hygiene and the lifelong need for antibiotic prophylaxis for all dental cleanings and other related invasive procedures.  We will plan routine follow-up echocardiogram in approximately 1 month.  Patient will return to our office for follow-up after her echocardiogram has been completed.  All questions answered.     Valentina Gu. Roxy Manns, MD 12/17/2018 10:20 AM

## 2018-12-17 NOTE — Patient Instructions (Addendum)
Increase your dose of metoprolol to 50 mg by mouth twice daily.  Continue all other previous medications without any changes at this time  You may continue to gradually increase your physical activity as tolerated.  Refrain from any heavy lifting or strenuous use of your arms and shoulders until at least 8 weeks from the time of your surgery, and avoid activities that cause increased pain in your chest on the side of your surgical incision.  Otherwise you may continue to increase activities without any particular limitations.  Increase the intensity and duration of physical activity gradually.  Endocarditis is a potentially serious infection of heart valves or inside lining of the heart.  It occurs more commonly in patients with diseased heart valves (such as patient's with aortic or mitral valve disease) and in patients who have undergone heart valve repair or replacement.  Certain surgical and dental procedures may put you at risk, such as dental cleaning, other dental procedures, or any surgery involving the respiratory, urinary, gastrointestinal tract, gallbladder or prostate gland.   To minimize your chances for develooping endocarditis, maintain good oral health and seek prompt medical attention for any infections involving the mouth, teeth, gums, skin or urinary tract.    Always notify your doctor or dentist about your underlying heart valve condition before having any invasive procedures. You will need to take antibiotics before certain procedures, including all routine dental cleanings or other dental procedures.  Your cardiologist or dentist should prescribe these antibiotics for you to be taken ahead of time.

## 2018-12-17 NOTE — Patient Instructions (Signed)
INSTRUCTIONS: MAKE SURE TO LOG BP AND HEART RATE DAILY  Follow-Up: You will need a follow up appointment in 1 months [place], MD or one of the following Advanced Practice Providers on your designated Care Team:  Rosaria Ferries, PA-C  Jory Sims, DNP, ANP     Medication Instructions:  NO CHANGES- Your physician recommends that you continue on your current medications as directed. Please refer to the Current Medication list given to you today. If you need a refill on your cardiac medications before your next appointment, please call your pharmacy. Labwork: When you have labs (blood work) and your tests are completely normal, you will receive your results ONLY by [place] (if you have MyChart) -OR- A paper copy in the mail.  At [facility], you and your health needs are our priority.  As part of our continuing mission to provide you with exceptional heart care, we have created designated Provider Care Teams.  These Care Teams include your primary Cardiologist (physician) and Advanced Practice Providers (APPs -  Physician Assistants and Nurse Practitioners) who all work together to provide you with the care you need, when you need it.  Thank you for choosing [facility] at [facility]!!

## 2018-12-18 ENCOUNTER — Ambulatory Visit (INDEPENDENT_AMBULATORY_CARE_PROVIDER_SITE_OTHER): Payer: 59 | Admitting: General Practice

## 2018-12-18 ENCOUNTER — Telehealth (HOSPITAL_COMMUNITY): Payer: Self-pay | Admitting: Cardiology

## 2018-12-18 DIAGNOSIS — Z7901 Long term (current) use of anticoagulants: Secondary | ICD-10-CM | POA: Diagnosis not present

## 2018-12-18 LAB — POCT INR: INR: 5.1 — AB (ref 2.0–3.0)

## 2018-12-18 NOTE — Telephone Encounter (Signed)

## 2018-12-18 NOTE — Patient Instructions (Addendum)
Pre visit review using our clinic review tool, if applicable. No additional management support is needed unless otherwise documented below in the visit note.  Hold coumadin today and tomorrow (6/16 and 6/17).  Take 1/2 tablet (2 mg) on Thursday. Take 4 mg (1 tablet on Saturday and Sunday) and take 1/2 tablet (2 mg) on  Monday.  Re-check in 1 week.  A full discussion of the nature of anticoagulants has been carried out.  A benefit risk analysis has been presented to the patient, so that they understand the justification for choosing anticoagulation at this time. The need for frequent and regular monitoring, precise dosage adjustment and compliance is stressed.  Side effects of potential bleeding are discussed.  The patient should avoid any OTC items containing aspirin or ibuprofen, and should avoid great swings in general diet.  Avoid alcohol consumption.  Call if any signs of abnormal bleeding.

## 2018-12-19 ENCOUNTER — Telehealth (HOSPITAL_COMMUNITY): Payer: Self-pay

## 2018-12-19 ENCOUNTER — Other Ambulatory Visit: Payer: Self-pay

## 2018-12-19 ENCOUNTER — Other Ambulatory Visit (HOSPITAL_COMMUNITY): Payer: 59

## 2018-12-19 ENCOUNTER — Telehealth (HOSPITAL_COMMUNITY): Payer: Self-pay | Admitting: *Deleted

## 2018-12-19 ENCOUNTER — Encounter (HOSPITAL_COMMUNITY)
Admission: RE | Admit: 2018-12-19 | Discharge: 2018-12-19 | Disposition: A | Discharge status: Home / Self Care | Payer: Self-pay | Source: Ambulatory Visit | Attending: Cardiology | Admitting: Cardiology

## 2018-12-19 NOTE — Telephone Encounter (Signed)
°        Confirm Consent - In the setting of the current Covid19 crisis, you are scheduled for a phone visit with your Cardiac or Pulmonary team member.  Just as we do with many in-gym visits, in order for you to participate in this visit, we must obtain consent.  If you'd like, I can send this to your mychart (if signed up) or email for you to review.  Otherwise, I can obtain your verbal consent now.  By agreeing to a telephone visit, we'd like you to understand that the technology does not allow for your Cardiac or Pulmonary Rehab team member to perform a physical assessment, and thus may limit their ability to fully assess your ability to perform exercise programs. If your provider identifies any concerns that need to be evaluated in person, we will make arrangements to do so.  Finally, though the technology is pretty good, we cannot assure that it will always work on either your or our end and we cannot ensure that we have a secure connection.  Cardiac and Pulmonary Rehab Telehealth visits and At Home cardiac and pulmonary rehab are provided at no cost to you.        Are you willing to proceed?"        STAFF: Did the patient verbally acknowledge consent to telehealth visit? Document YES/NO here: Yes     Jessica C.  Cardiac and Pulmonary Rehab Staff        Date 12/19/2018    @ Time 9:56AM

## 2018-12-19 NOTE — Telephone Encounter (Signed)
Left pt VM to set up virtual cardiac rehab app.   Carma Lair MS, ACSM CEP 11:52 AM 12/19/2018

## 2018-12-19 NOTE — Telephone Encounter (Signed)
Dr.  Percival Spanish  As you are aware our department remains closed to patients due to Covid-19. We are excited to be able to offer an alternative to traditional onsite Cardiac Rehab while your patient continues to follow Re-Open guidelines. This is a notification that your patient has been contacted and is very interested in participating in Virtual Cardiac Rehab.  Thank you for your continued support in helping [place] meet the health care needs of our patients.  Jessica C.  Cardiac Rehab staff

## 2018-12-19 NOTE — Telephone Encounter (Signed)
Pt is interested in participating in Virtual Cardiac Rehab. Pt advised that Virtual Cardiac Rehab is provided at no cost to the patient.  Checklist:  1. Pt has smart device  ie smartphone and/or ipad for downloading an app  Yes 2. Reliable internet/wifi service    Yes 3. Understands how to use their smartphone and navigate within an app.  Yes   Reviewed with pt the scheduling process for virtual cardiac rehab.  Pt verbalized understanding.  Called and spoke to pt regarding Virtual Cardiac Rehab.  Pt  was able to download the [employer] app on their smart device with no issues. Pt set up their account and received the following welcome message -"Welcome to the [place] and Pulmonary Rehabilitation program. We hope that you will find the exercise program beneficial in your recovery process. Our staff is available to assist with any questions/concerns about your exercise routine. Best wishes". Brief orientation provided to with the advisement to watch the "Intro to Rehab" series located under the Resource tab. Pt verbalized understanding. Will continue to follow and monitor pt progress with feedback as needed.Barnet Pall, RN,BSN 12/19/2018 3:04 PM

## 2018-12-20 ENCOUNTER — Encounter: Payer: Self-pay | Admitting: Gastroenterology

## 2018-12-24 ENCOUNTER — Telehealth: Payer: Self-pay

## 2018-12-24 ENCOUNTER — Other Ambulatory Visit: Payer: Self-pay | Admitting: Thoracic Surgery (Cardiothoracic Vascular Surgery)

## 2018-12-24 ENCOUNTER — Telehealth: Payer: Self-pay | Admitting: Internal Medicine

## 2018-12-24 DIAGNOSIS — Z7901 Long term (current) use of anticoagulants: Secondary | ICD-10-CM

## 2018-12-24 NOTE — Telephone Encounter (Signed)
Patient advised, ok to come in at her earliest convenience for labwork---opening at 7:30pm and closing at 5pm, no appt needed, we will call you back with lab value result and any med dosage changes if needed

## 2018-12-24 NOTE — Telephone Encounter (Signed)
Lab order placed.

## 2018-12-24 NOTE — Telephone Encounter (Signed)
Lab ordered.

## 2018-12-24 NOTE — Telephone Encounter (Signed)
Patient was scheduled with Cindy for 6/22.  Patient would like a lab draw done and a call back with instructions.

## 2018-12-25 ENCOUNTER — Ambulatory Visit (INDEPENDENT_AMBULATORY_CARE_PROVIDER_SITE_OTHER): Payer: 59 | Admitting: General Practice

## 2018-12-25 ENCOUNTER — Other Ambulatory Visit (INDEPENDENT_AMBULATORY_CARE_PROVIDER_SITE_OTHER): Payer: 59

## 2018-12-25 ENCOUNTER — Ambulatory Visit: Payer: 59

## 2018-12-25 ENCOUNTER — Encounter: Payer: Self-pay | Admitting: Internal Medicine

## 2018-12-25 DIAGNOSIS — Z7901 Long term (current) use of anticoagulants: Secondary | ICD-10-CM

## 2018-12-25 LAB — PROTIME-INR
INR: 1.9 ratio — ABNORMAL HIGH (ref 0.8–1.0)
Prothrombin Time: 22.2 s — ABNORMAL HIGH (ref 9.6–13.1)

## 2018-12-25 NOTE — Patient Instructions (Signed)
Pre visit review using our clinic review tool, if applicable. No additional management support is needed unless otherwise documented below in the visit note.  Please take 1 1/2 tablets (6 mg) today and then take 1 tablet (4 mg) daily except 1/2 tablet on Monday/Wed and Fridays.  Re-check in 1 week.

## 2018-12-26 ENCOUNTER — Encounter: Payer: Self-pay | Admitting: Thoracic Surgery (Cardiothoracic Vascular Surgery)

## 2018-12-26 ENCOUNTER — Other Ambulatory Visit: Payer: Self-pay | Admitting: Internal Medicine

## 2018-12-26 DIAGNOSIS — Z9889 Other specified postprocedural states: Secondary | ICD-10-CM

## 2018-12-26 DIAGNOSIS — Z5181 Encounter for therapeutic drug level monitoring: Secondary | ICD-10-CM | POA: Insufficient documentation

## 2018-12-26 MED ORDER — WARFARIN SODIUM 5 MG PO TABS: 5.0000 mg | ORAL_TABLET | Freq: Every day | ORAL | 0 refills | Status: DC

## 2018-12-27 ENCOUNTER — Telehealth: Payer: Self-pay

## 2018-12-27 ENCOUNTER — Other Ambulatory Visit: Payer: Self-pay

## 2018-12-27 MED ORDER — TRAMADOL HCL 50 MG PO TABS: 50.0000 mg | ORAL_TABLET | Freq: Four times a day (QID) | ORAL | 0 refills | Status: DC | PRN

## 2018-12-27 NOTE — Telephone Encounter (Signed)
-----   Message from [name], MD sent at 12/27/2018  4:18 PM EDT ----- That's fine ----- Message ----- From: Donnella Sham, RN Sent: 12/27/2018   3:39 PM EDT To: [name], MD  Hey,   She is requesting a refill of Tramadol.  Surgery was 11/28/2018 Mini [facility]. This will be the first refill she has requested.  If so, I can call it into her pharmacy.  If not, I can tell her to continue to take Tylenol.  Thanks,  Caryl Pina

## 2018-12-27 NOTE — Telephone Encounter (Signed)
Contacted [facility], [place] given refill of Tramadol, per Dr. Roxy Manns 50mg  q6hrs as needed #20 with no refills.  Patient made aware via mychart.

## 2019-01-01 ENCOUNTER — Ambulatory Visit (INDEPENDENT_AMBULATORY_CARE_PROVIDER_SITE_OTHER): Payer: 59 | Admitting: General Practice

## 2019-01-01 ENCOUNTER — Other Ambulatory Visit: Payer: Self-pay

## 2019-01-01 ENCOUNTER — Encounter (HOSPITAL_COMMUNITY): Payer: Self-pay | Admitting: *Deleted

## 2019-01-01 DIAGNOSIS — Z7901 Long term (current) use of anticoagulants: Secondary | ICD-10-CM | POA: Diagnosis not present

## 2019-01-01 LAB — POCT INR: INR: 2.1 (ref 2.0–3.0)

## 2019-01-01 NOTE — Progress Notes (Signed)
Pt inactive in [facility].  Letter mailed requesting patient to return call regarding this by 01/15/2019. If no response, will discharge from cardiac rehab program.  

## 2019-01-01 NOTE — Patient Instructions (Addendum)
Pre visit review using our clinic review tool, if applicable. No additional management support is needed unless otherwise documented below in the visit note.  Take 1 tablet (4 mg) daily except 1/2 tablet on Monday/Fridays.  Re-check in 2 week.

## 2019-01-11 ENCOUNTER — Other Ambulatory Visit: Payer: Self-pay

## 2019-01-11 ENCOUNTER — Ambulatory Visit (HOSPITAL_COMMUNITY): Payer: 59 | Attending: Cardiology

## 2019-01-11 DIAGNOSIS — Z9889 Other specified postprocedural states: Secondary | ICD-10-CM

## 2019-01-15 ENCOUNTER — Encounter: Payer: Self-pay | Admitting: Cardiology

## 2019-01-15 ENCOUNTER — Other Ambulatory Visit: Payer: Self-pay

## 2019-01-15 ENCOUNTER — Ambulatory Visit (INDEPENDENT_AMBULATORY_CARE_PROVIDER_SITE_OTHER): Payer: 59 | Admitting: General Practice

## 2019-01-15 DIAGNOSIS — Z7901 Long term (current) use of anticoagulants: Secondary | ICD-10-CM | POA: Diagnosis not present

## 2019-01-15 LAB — POCT INR: INR: 3.1 — AB (ref 2.0–3.0)

## 2019-01-15 NOTE — Patient Instructions (Addendum)
Pre visit review using our clinic review tool, if applicable. No additional management support is needed unless otherwise documented below in the visit note.  Skip coumadin today (7/14) and then continue to take 1 tablet (4 mg) daily except 1/2 tablet on Monday/Fridays.  Re-check in 3 weeks.
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
## 2019-01-16 ENCOUNTER — Telehealth: Payer: Self-pay | Admitting: Cardiology

## 2019-01-16 NOTE — Telephone Encounter (Signed)
LVM, reminding pt of her appt on 01-17-19 with Dr Percival Spanish.

## 2019-01-17 ENCOUNTER — Telehealth (INDEPENDENT_AMBULATORY_CARE_PROVIDER_SITE_OTHER): Payer: 59 | Admitting: Cardiology

## 2019-01-17 ENCOUNTER — Encounter: Payer: Self-pay | Admitting: Cardiology

## 2019-01-17 VITALS — BP 124/88 | HR 85 | Ht 63.0 in | Wt 183.0 lb

## 2019-01-17 DIAGNOSIS — I340 Nonrheumatic mitral (valve) insufficiency: Secondary | ICD-10-CM

## 2019-01-17 DIAGNOSIS — I429 Cardiomyopathy, unspecified: Secondary | ICD-10-CM | POA: Insufficient documentation

## 2019-01-17 DIAGNOSIS — Z7189 Other specified counseling: Secondary | ICD-10-CM | POA: Diagnosis not present

## 2019-01-17 MED ORDER — LOSARTAN POTASSIUM 25 MG PO TABS: 25.0000 mg | ORAL_TABLET | Freq: Every day | ORAL | 1 refills | Status: DC

## 2019-01-17 NOTE — Patient Instructions (Addendum)
Medication Instructions:  START LOSARTAN 25 MG DAILY   If you need a refill on your cardiac medications before your next appointment, please call your pharmacy.   Lab work: NONE   Testing/Procedures: NONE  Follow-Up: At [facility], you and your health needs are our priority.  As part of our continuing mission to provide you with exceptional heart care, we have created designated Provider Care Teams.  These Care Teams include your primary Cardiologist (physician) and Advanced Practice Providers (APPs -  Physician Assistants and Nurse Practitioners) who all work together to provide you with the care you need, when you need it. You will need a follow up appointment in 6 months IN THE OFFICE.  Please call our office 2 months in advance to schedule this appointment.  You may see [name], MD or one of the following Advanced Practice Providers on your designated Care Team:   Rosaria Ferries, PA-C . Jory Sims, DNP, ANP

## 2019-01-21 ENCOUNTER — Other Ambulatory Visit: Payer: Self-pay

## 2019-01-21 ENCOUNTER — Telehealth (INDEPENDENT_AMBULATORY_CARE_PROVIDER_SITE_OTHER): Payer: Self-pay | Admitting: Thoracic Surgery (Cardiothoracic Vascular Surgery)

## 2019-01-21 DIAGNOSIS — Z9889 Other specified postprocedural states: Secondary | ICD-10-CM

## 2019-01-21 NOTE — Progress Notes (Signed)
[address]       ,[place] [address]             [phone]     CARDIOTHORACIC SURGERY TELEPHONE VIRTUAL OFFICE NOTE  Referring Provider is [name], MD Primary Cardiologist is [name], MD PCP is [name], MD   HPI:  I spoke with [name] (DOB [birth year] ) via telephone on 01/21/2019 at 10:35 AM and verified that I was speaking with the correct person using more than one form of identification.  We discussed the reason(s) for conducting our visit virtually instead of in-person.  The patient expressed understanding the circumstances and agreed to proceed as described.   Patient is a 43 year old moderately obese African-American female who underwent minimally invasive mitral valve repair on Nov 28, 2018 for stage C severe asymptomatic primary mitral regurgitation caused by primarily type I leaflet dysfunction with annular dilatation and bileaflet billowing with only minimal prolapse.  Prior to surgery she had also developed a noticeable decline in left ventricular ejection fraction.  Her postoperative recovery has been been uneventful and she was last seen here in our office December 17, 2018 at which time she was doing well.  Since then she underwent routine follow-up echocardiogram January 11, 2019 which revealed intact mitral valve repair with trivial residual mitral regurgitation.  Mean transvalvular gradient across the mitral valve was estimated 6 mmHg.  Left ventricular systolic function was mild to moderately reduced with ejection fraction estimated 40 to 45%.  Last week she was started on losartan by Dr. Percival Spanish.  I spoke with the patient over the telephone today.  She reports that she is doing very well.  She denies any symptoms of exertional shortness of breath.  She has been walking daily but she has not really been able to exercise otherwise because all of the gyms are closed in the cardiac rehab program has not resumed.  She has not  been pushing herself physically.  So far she cannot tell whether or not her exercise tolerance has been improved by surgical intervention.  She has no pain whatsoever.  She is eating and sleeping well.  Overall she is quite pleased with her progress.  At present she remains anticoagulated using warfarin.   Current Outpatient Medications  Medication Sig Dispense Refill  . clindamycin (CLEOCIN T) 1 % external solution Apply 1 application topically 2 (two) times daily.   1  . ferrous sulfate 325 (65 FE) MG tablet Take 1 tablet (325 mg total) by mouth 2 (two) times daily with a meal. 180 tablet 0  . losartan (COZAAR) 25 MG tablet Take 1 tablet (25 mg total) by mouth daily. 90 tablet 1  . metoprolol tartrate (LOPRESSOR) 50 MG tablet Take 1 tablet (50 mg total) by mouth 2 (two) times daily. 30 tablet 3  . tazarotene (AVAGE) 0.1 % cream Apply 1 application topically at bedtime.   2  . Ubrogepant (UBRELVY) 100 MG TABS Take 1 tablet by mouth as needed. (Patient taking differently: Take 1 tablet by mouth daily as needed (migraine). ) 10 tablet 5  . warfarin (COUMADIN) 5 MG tablet Take 1 tablet (5 mg total) by mouth daily. 30 tablet 0   No current facility-administered medications for this visit.      Diagnostic Tests:   ECHOCARDIOGRAM REPORT    Patient Name:   [name] Date of Exam: 01/11/2019 Medical Rec #:  [id number]  Height:       63.0 in Accession #:    [id number]            Weight:       178.0 lb Date of Birth:  10-31-1975             BSA:          1.84 m Patient Age:    23 years              BP:           119/89 mmHg Patient Gender: F                     HR:           89 bpm. Exam Location:  [place]    Procedure: 2D Echo, Cardiac Doppler, Color Doppler and 3D Echo  Indications:    I05.9 Mitral Valve Disorder   History:        Patient has prior history of Echocardiogram examinations, most                 recent 08/08/2018. Status post Mitral valve repair 32  mm Sorin                 Memo 4D ring annuloplasty via right thoracotomy. Risk Factors:                 Hypertension and Dyslipidemia. Murmur. History of Mitral valve                 prolapse.   Sonographer:    Wilford Sports Rodgers-Jones RDCS Referring Phys: [place]    1. The left ventricle has mild-moderately reduced systolic function, with an ejection fraction of 40-45%. The cavity size was normal. There is mildly increased left ventricular wall thickness. Left ventricular diastolic function could not be evaluated.  Elevated mean left atrial pressure Left ventricular diffuse hypokinesis.  2. The right ventricle has normal systolic function. The cavity was normal.  3. Left atrial size was mildly dilated.  4. Trivial pericardial effusion is present.  5. The mitral valve is abnormal. Mild thickening of the mitral valve leaflet. Mild mitral valve stenosis.  6. The tricuspid valve is grossly normal.  7. The aortic valve is tricuspid. Mild thickening of the aortic valve. No stenosis of the aortic valve.  8. Mild to moderate global reduction in LV systolic function; mild LVH; s/p MV repair with mild MS (MVA 1.9 cm2 and mean gradient 6 mmHg); trivial MR; mild LAE.  FINDINGS  Left Ventricle: The left ventricle has mild-moderately reduced systolic function, with an ejection fraction of 40-45%. The cavity size was normal. There is mildly increased left ventricular wall thickness. Left ventricular diastolic function could not  be evaluated. Elevated mean left atrial pressure Left ventricular diffuse hypokinesis.  Right Ventricle: The right ventricle has normal systolic function. The cavity was normal.  Left Atrium: Left atrial size was mildly dilated.  Right Atrium: Right atrial size was normal in size. Right atrial pressure is estimated at 10 mmHg.  Interatrial Septum: No atrial level shunt detected by color flow Doppler.  Pericardium: Trivial pericardial  effusion is present.  Mitral Valve: The mitral valve is abnormal. Mild thickening of the mitral valve leaflet. Mitral valve regurgitation is trivial by color flow Doppler. Mild mitral valve stenosis.  Tricuspid Valve: The tricuspid valve is grossly normal. Tricuspid valve regurgitation is trivial by color flow Doppler.  Aortic  Valve: The aortic valve is tricuspid Mild thickening of the aortic valve. Aortic valve regurgitation was not visualized by color flow Doppler. There is No stenosis of the aortic valve.  Pulmonic Valve: The pulmonic valve was not well visualized. Pulmonic valve regurgitation is trivial by color flow Doppler.  Venous: The inferior vena cava is normal in size with greater than 50% respiratory variability.  Additional Comments: Mild to moderate global reduction in LV systolic function; mild LVH; s/p MV repair with mild MS (MVA 1.9 cm2 and mean gradient 6 mmHg); trivial MR; mild LAE.    +--------------+--------++ LEFT VENTRICLE         +----------------+---------++ +--------------+--------++ Diastology                PLAX 2D                +----------------+---------++ +--------------+--------++ LV e' lateral:  6.20 cm/s LVIDd:        4.30 cm  +----------------+---------++ +--------------+--------++ LV E/e' lateral:21.6      LVIDs:        3.20 cm  +----------------+---------++ +--------------+--------++ LV e' medial:   5.55 cm/s LV PW:        1.20 cm  +----------------+---------++ +--------------+--------++ LV E/e' medial: 24.1      LV IVS:       1.10 cm  +----------------+---------++ +--------------+--------++ LVOT diam:    2.00 cm  +--------------+--------++ LV SV:        42 ml    +--------------+--------++ +-------------+------++ LV SV Index:  21.88    3D Volume EF:       +--------------+--------++ +-------------+------++ LVOT Area:    3.14 cm 3D EF:       45 %    +--------------+--------++ +-------------+------++                        LV EDV:      123 ml +--------------+--------++ +-------------+------++                            LV ESV:      67 ml                             +-------------+------++                            LV SV:       55 ml                             +-------------+------++  +---------------+----------++ RIGHT VENTRICLE           +---------------+----------++ RV Basal diam: 3.40 cm    +---------------+----------++ RV S prime:    13.60 cm/s +---------------+----------++ TAPSE (M-mode):2.1 cm     +---------------+----------++  +---------------+-------++-----------++ LEFT ATRIUM           Index       +---------------+-------++-----------++ LA diam:       3.90 cm2.12 cm/m  +---------------+-------++-----------++ LA Vol (A2C):  51.5 ml27.99 ml/m +---------------+-------++-----------++ LA Vol (A4C):  46.7 ml25.38 ml/m +---------------+-------++-----------++ LA Biplane Vol:49.3 ml26.79 ml/m +---------------+-------++-----------++ +------------+---------++-----------++ RIGHT ATRIUM         Index       +------------+---------++-----------++ RA Area:    10.70 cm            +------------+---------++-----------++ RA Volume:  25.30 ml 13.75 ml/m +------------+---------++-----------++  +------------+-----------++ AORTIC VALVE            +------------+-----------++ LVOT Vmax:  72.30 cm/s  +------------+-----------++ LVOT Vmean: 54.600 cm/s +------------+-----------++ LVOT VTI:   0.134 m     +------------+-----------++   +-------------+-------++ AORTA                +-------------+-------++ Ao Root diam:2.80 cm +-------------+-------++ Ao Asc diam: 3.10 cm +-------------+-------++  +--------------+-----------++ +---------------+-----------++ MITRAL VALVE              TRICUSPID VALVE             +--------------+-----------++ +---------------+-----------++ MV Area (PHT):2.20 cm    TR Peak grad:  17.4 mmHg   +--------------+-----------++ +---------------+-----------++ MV Peak grad: 12.4 mmHg   TR Vmax:       214.00 cm/s +--------------+-----------++ +---------------+-----------++ MV Mean grad: 7.0 mmHg    +--------------+-----------++ +--------------+-------+ MV Vmax:      1.76 m/s    SHUNTS                +--------------+-----------++ +--------------+-------+ MV Vmean:     124.5 cm/s  Systemic VTI: 0.13 m  +--------------+-----------++ +--------------+-------+ MV VTI:       0.44 m      Systemic Diam:2.00 cm +--------------+-----------++ +--------------+-------+ MV PHT:       100.05 msec +--------------+-----------++ MV Decel Time:345 msec    +--------------+-----------++ +--------------+-----------++ MV E velocity:134.00 cm/s +--------------+-----------++ MV A velocity:156.00 cm/s +--------------+-----------++ MV E/A ratio: 0.86        +--------------+-----------++    Kirk Ruths MD Electronically signed by Kirk Ruths MD Signature Date/Time: 01/11/2019/2:05:54 PM      Impression:  Patient is doing very well nearly 2 months status post minimally invasive mitral valve repair.  I have personally reviewed the patient's recent follow-up transthoracic echocardiogram which demonstrates stable mild to moderate left ventricular systolic dysfunction with intact mitral valve repair and trivial residual mitral regurgitation.  Mean transvalvular gradient across mitral valve was estimated 6 mmHg.    Plan:  We have not recommended any change to the patient's current medications.  I have encouraged the patient to continue to gradually increase her physical activity without any particular limitations.  Patient has been reminded that as long as she does not develop any signs or symptoms of atrial dysrhythmias she  may stop taking warfarin in approximately 1 month.  At that time she should resume taking aspirin 81 mg daily.  The patient has been reminded regarding the importance of dental hygiene and the lifelong need for antibiotic prophylaxis for all dental cleanings and other related invasive procedures.  The patient will continue to follow-up with Dr. Percival Spanish and return to our office for routine follow-up next May, approximately 1 year following her surgery.  She will call and return sooner should specific problems or questions arise.    I discussed limitations of evaluation and management via telephone.  The patient was advised to call back for repeat telephone consultation or to seek an in-person evaluation if questions arise or the patient's clinical condition changes in any significant manner.  I spent in excess of 5 minutes of non-face-to-face time during the conduct of this telephone virtual office consultation.     Valentina Gu. Roxy Manns, MD 01/21/2019 10:35 AM

## 2019-01-31 LAB — ECHO TEE
Ao-asc: 2.8 cm
LVOT diameter: 23 mm
Mean grad: 0 mmHg
STJ: 2.4 cm
Sinus: 2.7 cm

## 2019-02-05 ENCOUNTER — Ambulatory Visit (INDEPENDENT_AMBULATORY_CARE_PROVIDER_SITE_OTHER): Payer: 59 | Admitting: General Practice

## 2019-02-05 ENCOUNTER — Other Ambulatory Visit: Payer: Self-pay

## 2019-02-05 DIAGNOSIS — Z7901 Long term (current) use of anticoagulants: Secondary | ICD-10-CM

## 2019-02-05 LAB — POCT INR: INR: 2.3 (ref 2.0–3.0)

## 2019-02-05 NOTE — Patient Instructions (Addendum)
Pre visit review using our clinic review tool, if applicable. No additional management support is needed unless otherwise documented below in the visit note.  Continue to take 1 tablet (4 mg) daily except 1/2 tablet on Monday/Fridays. Pt will be stopping coumadin at the end of the month and transitioning to an aspirin per Dr. Percival Spanish. This will be patient's last visit with coumadin clinic.

## 2019-02-05 NOTE — Progress Notes (Addendum)
I have reviewed and agree.

## 2019-03-04 ENCOUNTER — Ambulatory Visit (INDEPENDENT_AMBULATORY_CARE_PROVIDER_SITE_OTHER): Payer: 59 | Admitting: Internal Medicine

## 2019-03-04 ENCOUNTER — Other Ambulatory Visit (INDEPENDENT_AMBULATORY_CARE_PROVIDER_SITE_OTHER): Payer: 59

## 2019-03-04 ENCOUNTER — Encounter: Payer: Self-pay | Admitting: Internal Medicine

## 2019-03-04 ENCOUNTER — Other Ambulatory Visit: Payer: Self-pay

## 2019-03-04 VITALS — BP 124/86 | HR 90 | Temp 97.9°F | Resp 16 | Ht 63.0 in | Wt 189.0 lb

## 2019-03-04 DIAGNOSIS — I5032 Chronic diastolic (congestive) heart failure: Secondary | ICD-10-CM

## 2019-03-04 DIAGNOSIS — D508 Other iron deficiency anemias: Secondary | ICD-10-CM | POA: Diagnosis not present

## 2019-03-04 DIAGNOSIS — E038 Other specified hypothyroidism: Secondary | ICD-10-CM

## 2019-03-04 LAB — CBC WITH DIFFERENTIAL/PLATELET
Basophils Absolute: 0.1 10*3/uL (ref 0.0–0.1)
Basophils Relative: 1.2 % (ref 0.0–3.0)
Eosinophils Absolute: 0.1 10*3/uL (ref 0.0–0.7)
Eosinophils Relative: 1.2 % (ref 0.0–5.0)
HCT: 43.1 % (ref 36.0–46.0)
Hemoglobin: 14 g/dL (ref 12.0–15.0)
Lymphocytes Relative: 21.9 % (ref 12.0–46.0)
Lymphs Abs: 1.5 10*3/uL (ref 0.7–4.0)
MCHC: 32.4 g/dL (ref 30.0–36.0)
MCV: 87.3 fl (ref 78.0–100.0)
Monocytes Absolute: 0.3 10*3/uL (ref 0.1–1.0)
Monocytes Relative: 4.8 % (ref 3.0–12.0)
Neutro Abs: 4.7 10*3/uL (ref 1.4–7.7)
Neutrophils Relative %: 70.9 % (ref 43.0–77.0)
Platelets: 279 10*3/uL (ref 150.0–400.0)
RBC: 4.94 Mil/uL (ref 3.87–5.11)
RDW: 15.6 % — ABNORMAL HIGH (ref 11.5–15.5)
WBC: 6.7 10*3/uL (ref 4.0–10.5)

## 2019-03-04 LAB — IBC PANEL
Iron: 111 ug/dL (ref 42–145)
Saturation Ratios: 32 % (ref 20.0–50.0)
Transferrin: 248 mg/dL (ref 212.0–360.0)

## 2019-03-04 MED ORDER — TORSEMIDE 10 MG PO TABS: 10.0000 mg | ORAL_TABLET | Freq: Every day | ORAL | 1 refills | Status: DC

## 2019-03-04 NOTE — Progress Notes (Signed)
Subjective:  Patient ID: [name], female    DOB: 27-Nov-1975  Age: 43 y.o. MRN: [id number]  CC: Anemia and Hypertension   HPI WYLODENE BURNINGHAM presents for f/up - She is status post minimally invasive mitral valve repair.  She has been diagnosed with grade 1 diastolic dysfunction.  She wants to take a water pill in the event that she starts retaining fluid.  She feels well today and offers no complaints.  Outpatient Medications Prior to Visit  Medication Sig Dispense Refill  . ferrous sulfate 325 (65 FE) MG tablet Take 1 tablet (325 mg total) by mouth 2 (two) times daily with a meal. 180 tablet 0  . losartan (COZAAR) 25 MG tablet Take 1 tablet (25 mg total) by mouth daily. 90 tablet 1  . metoprolol tartrate (LOPRESSOR) 50 MG tablet Take 1 tablet (50 mg total) by mouth 2 (two) times daily. 30 tablet 3  . tazarotene (AVAGE) 0.1 % cream Apply 1 application topically at bedtime.   2  . Ubrogepant (UBRELVY) 100 MG TABS Take 1 tablet by mouth as needed. (Patient taking differently: Take 1 tablet by mouth daily as needed (migraine). ) 10 tablet 5  . clindamycin (CLEOCIN T) 1 % external solution Apply 1 application topically 2 (two) times daily.   1  . warfarin (COUMADIN) 5 MG tablet Take 1 tablet (5 mg total) by mouth daily. 30 tablet 0   No facility-administered medications prior to visit.     ROS Review of Systems  Constitutional: Negative for diaphoresis and fatigue.  HENT: Negative.   Eyes: Negative for visual disturbance.  Respiratory: Negative for cough, chest tightness, shortness of breath and wheezing.   Cardiovascular: Negative for chest pain, palpitations and leg swelling.  Gastrointestinal: Negative for abdominal pain, constipation, diarrhea, nausea and vomiting.  Endocrine: Negative.  Negative for cold intolerance and heat intolerance.  Genitourinary: Negative.  Negative for difficulty urinating.  Musculoskeletal: Negative.  Negative for arthralgias, back  pain, myalgias and neck pain.  Skin: Negative.  Negative for color change and pallor.  Neurological: Negative.  Negative for dizziness, weakness and light-headedness.  Hematological: Negative for adenopathy. Does not bruise/bleed easily.  Psychiatric/Behavioral: Negative.     Objective:  BP 124/86 (BP Location: Left Arm, Patient Position: Sitting, Cuff Size: Large)   Pulse 90   Temp 97.9 F (36.6 C) (Oral)   Resp 16   Ht 5\' 3"  (1.6 m)   Wt 189 lb (85.7 kg)   LMP 01/12/2012   SpO2 98%   BMI 33.48 kg/m   BP Readings from Last 3 Encounters:  03/04/19 124/86  01/17/19 124/88  12/17/18 119/89    Wt Readings from Last 3 Encounters:  03/04/19 189 lb (85.7 kg)  01/17/19 183 lb (83 kg)  12/17/18 178 lb (80.7 kg)    Physical Exam Vitals signs reviewed.  Constitutional:      Appearance: She is obese. She is not ill-appearing or diaphoretic.  HENT:     Nose: Nose normal.     Mouth/Throat:     Mouth: Mucous membranes are moist.  Eyes:     Conjunctiva/sclera: Conjunctivae normal.  Neck:     Musculoskeletal: Normal range of motion and neck supple.  Cardiovascular:     Rate and Rhythm: Normal rate and regular rhythm.     Heart sounds: No murmur.  Pulmonary:     Effort: Pulmonary effort is normal.     Breath sounds: No stridor. No wheezing, rhonchi or rales.  Abdominal:  General: Abdomen is protuberant. Bowel sounds are normal. There is no distension.     Palpations: There is no hepatomegaly, splenomegaly or mass.     Tenderness: There is no abdominal tenderness.  Musculoskeletal: Normal range of motion.     Right lower leg: No edema.     Left lower leg: No edema.  Skin:    General: Skin is warm and dry.     Coloration: Skin is not pale.  Neurological:     General: No focal deficit present.     Mental Status: She is alert.     Lab Results  Component Value Date   WBC 6.7 03/04/2019   HGB 14.0 03/04/2019   HCT 43.1 03/04/2019   PLT 279.0 03/04/2019   GLUCOSE  113 (H) 12/06/2018   CHOL 179 07/10/2018   TRIG 142.0 07/10/2018   HDL 54.70 07/10/2018   LDLCALC 96 07/10/2018   ALT 38 12/06/2018   AST 23 12/06/2018   NA 136 12/06/2018   K 4.0 12/06/2018   CL 103 12/06/2018   CREATININE 0.91 12/06/2018   BUN <5 (L) 12/06/2018   CO2 22 12/06/2018   TSH 2.69 03/04/2019   INR 2.3 02/05/2019   HGBA1C 5.5 11/19/2018    No results found.  Assessment & Plan:   Arinn was seen today for anemia and hypertension.  Diagnoses and all orders for this visit:  Other specified hypothyroidism- Her TSH is in the normal range.  Thyroid replacement therapy is not indicated. -     TSH; Future  Iron deficiency anemia secondary to inadequate dietary iron intake- Her H&H and iron levels are normal now. -     CBC with Differential/Platelet; Future -     IBC panel; Future -     Ferritin; Future  Chronic diastolic CHF (congestive heart failure) (HCC) -     torsemide (DEMADEX) 10 MG tablet; Take 1 tablet (10 mg total) by mouth daily.   I have discontinued Pura Spice D. Winther's clindamycin and warfarin. I am also having her start on torsemide. Additionally, I am having her maintain her tazarotene, Ubrogepant, ferrous sulfate, metoprolol tartrate, and losartan.  Meds ordered this encounter  Medications  . torsemide (DEMADEX) 10 MG tablet    Sig: Take 1 tablet (10 mg total) by mouth daily.    Dispense:  90 tablet    Refill:  1     Follow-up: Return in about 3 months (around 06/03/2019).  [name], MD

## 2019-03-04 NOTE — Patient Instructions (Signed)
Iron Deficiency Anemia, Adult Iron deficiency anemia is a condition in which the concentration of red blood cells or hemoglobin in the blood is below normal because of too little iron. Hemoglobin is a substance in red blood cells that carries oxygen to the body's tissues. When the concentration of red blood cells or hemoglobin is too low, not enough oxygen reaches these tissues. Iron deficiency anemia is usually long-lasting (chronic) and it develops over time. It may or may not cause symptoms. It is a common type of anemia. What are the causes? This condition may be caused by:  Not enough iron in the diet.  Blood loss caused by bleeding in the intestine.  Blood loss from a gastrointestinal condition like Crohn disease.  Frequent blood draws, such as from blood donation.  Abnormal absorption in the gut.  Heavy menstrual periods in women.  Cancers of the gastrointestinal system, such as colon cancer. What are the signs or symptoms? Symptoms of this condition may include:  Fatigue.  Headache.  Pale skin, lips, and nail beds.  Poor appetite.  Weakness.  Shortness of breath.  Dizziness.  Cold hands and feet.  Fast or irregular heartbeat.  Irritability. This is more common in severe anemia.  Rapid breathing. This is more common in severe anemia. Mild anemia may not cause any symptoms. How is this diagnosed? This condition is diagnosed based on:  Your medical history.  A physical exam.  Blood tests. You may have additional tests to find the underlying cause of your anemia, such as:  Testing for blood in the stool (fecal occult blood test).  A procedure to see inside your colon and rectum (colonoscopy).  A procedure to see inside your esophagus and stomach (endoscopy).  A test in which cells are removed from bone marrow (bone marrow aspiration) or fluid is removed from the bone marrow to be examined (biopsy). This is rarely needed. How is this treated? This  condition is treated by correcting the cause of your iron deficiency. Treatment may involve:  Adding iron-rich foods to your diet.  Taking iron supplements. If you are pregnant or breastfeeding, you may need to take extra iron because your normal diet usually does not provide the amount of iron that you need.  Increasing vitamin C intake. Vitamin C helps your body absorb iron. Your health care provider may recommend that you take iron supplements along with a glass of orange juice or a vitamin C supplement.  Medicines to make heavy menstrual flow lighter.  Surgery. You may need repeat blood tests to determine whether treatment is working. Depending on the underlying cause, the anemia should be corrected within 2 months of starting treatment. If the treatment does not seem to be working, you may need more testing. Follow these instructions at home: Medicines  Take over-the-counter and prescription medicines only as told by your health care provider. This includes iron supplements and vitamins.  If you cannot tolerate taking iron supplements by mouth, talk with your health care provider about taking them through a vein (intravenously) or an injection into a muscle.  For the best iron absorption, you should take iron supplements when your stomach is empty. If you cannot tolerate them on an empty stomach, you may need to take them with food.  Do not drink milk or take antacids at the same time as your iron supplements. Milk and antacids may interfere with iron absorption.  Iron supplements can cause constipation. To prevent constipation, include fiber in your diet as told   by your health care provider. A stool softener may also be recommended. Eating and drinking   Talk with your health care provider before changing your diet. He or she may recommend that you eat foods that contain a lot of iron, such as: ? Liver. ? Low-fat (lean) beef. ? Breads and cereals that have iron added to them (are  fortified). ? Eggs. ? Dried fruit. ? Dark green, leafy vegetables.  To help your body use the iron from iron-rich foods, eat those foods at the same time as fresh fruits and vegetables that are high in vitamin C. Foods that are high in vitamin C include: ? Oranges. ? Peppers. ? Tomatoes. ? Mangoes.  Drinkenoughfluid to keep your urine clear or pale yellow. General instructions  Return to your normal activities as told by your health care provider. Ask your health care provider what activities are safe for you.  Practice good hygiene. Anemia can make you more prone to illness and infection.  Keep all follow-up visits as told by your health care provider. This is important. Contact a health care provider if:  You feel nauseous or you vomit.  You feel weak.  You have unexplained sweating.  You develop symptoms of constipation, such as: ? Having fewer than three bowel movements a week. ? Straining to have a bowel movement. ? Having stools that are hard, dry, or larger than normal. ? Feeling full or bloated. ? Pain in the lower abdomen. ? Not feeling relief after having a bowel movement. Get help right away if:  You faint. If this happens, do not drive yourself to the hospital. Call your local emergency services (911 in the [place]  You have chest pain.  You have shortness of breath that: ? Is severe. ? Gets worse with physical activity.  You have a rapid heartbeat.  You become light-headed when getting up from a sitting or lying down position. This information is not intended to replace advice given to you by your health care provider. Make sure you discuss any questions you have with your health care provider. Document Released: 06/17/2000 Document Revised: 06/02/2017 Document Reviewed: 03/09/2016 Elsevier Patient Education  2020 Else[employer].  

## 2019-03-05 ENCOUNTER — Encounter: Payer: Self-pay | Admitting: Internal Medicine

## 2019-03-05 ENCOUNTER — Ambulatory Visit: Payer: 59

## 2019-03-05 ENCOUNTER — Encounter: Payer: Self-pay | Admitting: Gastroenterology

## 2019-03-05 ENCOUNTER — Telehealth: Payer: Self-pay

## 2019-03-05 ENCOUNTER — Ambulatory Visit: Payer: 59 | Admitting: Gastroenterology

## 2019-03-05 VITALS — BP 100/74 | HR 104 | Temp 98.5°F | Ht 62.75 in | Wt 190.0 lb

## 2019-03-05 DIAGNOSIS — R197 Diarrhea, unspecified: Secondary | ICD-10-CM

## 2019-03-05 DIAGNOSIS — Z1509 Genetic susceptibility to other malignant neoplasm: Secondary | ICD-10-CM

## 2019-03-05 LAB — FERRITIN: Ferritin: 34.6 ng/mL (ref 10.0–291.0)

## 2019-03-05 LAB — TSH: TSH: 2.69 u[IU]/mL (ref 0.35–4.50)

## 2019-03-05 MED ORDER — NA SULFATE-K SULFATE-MG SULF 17.5-3.13-1.6 GM/177ML PO SOLN: 1.0000 | Freq: Once | ORAL | 0 refills | Status: AC

## 2019-03-05 NOTE — Patient Instructions (Signed)
Your provider has requested that you go to the basement level for lab work before leaving today. Press "B" on the elevator. The lab is located at the first door on the left as you exit the elevator.  You have been scheduled for an endoscopy and colonoscopy. Please follow the written instructions given to you at your visit today. Please pick up your prep supplies at the pharmacy within the next 1-3 days. If you use inhalers (even only as needed), please bring them with you on the day of your procedure. Your physician has requested that you go to www.startemmi.com and enter the access code given to you at your visit today. This web site gives a general overview about your procedure. However, you should still follow specific instructions given to you by our office regarding your preparation for the procedure.  We will obtain cardiac clearance from Dr. Percival Spanish.   Thank you for choosing me and [facility].  Pricilla Riffle. Dagoberto Ligas., MD., [facility] Regal

## 2019-03-05 NOTE — Telephone Encounter (Signed)
   [name] [birth year] [id number]  Dear Dr. Percival Spanish:  We have scheduled the above named patient for a(n) Upper Endoscopy/Colonoscopy procedure. We are contacting you to obtain cardiac clearance before her scheduled procedures on 03/30/2019.  Please route your response to Marlon Pel, CMA.  Sincerely,    [facility]

## 2019-03-05 NOTE — Progress Notes (Signed)
    History of Present Illness: This is a 43 year old female with PMS2 related Lynch syndrome. She underwent minimally invasive mitral valve repair on 11/28/2018. Echocardiogram on 7/10 showed EF 40-45%. She relates frequent diarrhea and urgency for past several weeks.  She notes no bowel movements for 1 or 2 days and then she will have a day with 4-5 urgent loose stools.  Lostartan and metoprolol started prior to bowel changes. Treated with iron for a couple months however this was discontinued without impact on bowel movements.  Her prior bowel pattern was a bowel movement about every 3 to 4 days.  This pattern was persistent for a number of years.  She was maintained on Coumadin until about 2 weeks ago which was discontinued and daily aspirin was started.  She was evaluated in follow-up by Dr. Ihor Gully and Dr. Percival Spanish in July.  Current Medications, Allergies, Past Medical History, Past Surgical History, Family History and Social History were reviewed in [employer] record.   Physical Exam: General: Well developed, well nourished, no acute distress Head: Normocephalic and atraumatic Eyes:  sclerae anicteric, EOMI Ears: Normal auditory acuity Mouth: No deformity or lesions Lungs: Clear throughout to auscultation Heart: Regular rate and rhythm; no murmurs, rubs or bruits Abdomen: Soft, non tender and non distended. No masses, hepatosplenomegaly or hernias noted. Normal Bowel sounds Rectal: deferred to colonoscopy Musculoskeletal: Symmetrical with no gross deformities  Pulses:  Normal pulses noted Extremities: No clubbing, cyanosis, edema or deformities noted Neurological: Alert oriented x 4, grossly nonfocal Psychological:  Alert and cooperative. Normal mood and affect   Assessment and Recommendations:  1.  PSM2 related Lynch syndrome.  Personal history of adenomatous colon polyps.  Change in bowel habits with frequent diarrhea and urgency. No polyps on colonoscopy in  September 2019.  She is due for annual colonoscopy and for EGD GI pathogen panel. The risks (including bleeding, perforation, infection, missed lesions, medication reactions and possible hospitalization or surgery if complications occur), benefits, and alternatives to colonoscopy with possible biopsy and possible polypectomy were discussed with the patient and they consent to proceed. The risks (including bleeding, perforation, infection, missed lesions, medication reactions and possible hospitalization or surgery if complications occur), benefits, and alternatives to endoscopy with possible biopsy and possible dilation were discussed with the patient and they consent to proceed.

## 2019-03-06 ENCOUNTER — Telehealth: Payer: Self-pay | Admitting: Internal Medicine

## 2019-03-06 ENCOUNTER — Other Ambulatory Visit: Payer: 59

## 2019-03-06 NOTE — Telephone Encounter (Signed)
Spouse Corene Cornea) is requesting FMLA for transporting patient to doctors appointments.   Provider has approved 1 time a month.  Forms have been completed & placed in providers box to sign.

## 2019-03-07 NOTE — Telephone Encounter (Signed)
Notified patient per Dr. Percival Spanish patient can proceed with procedure on 03/30/19. Patient verbalized understanding.

## 2019-03-07 NOTE — Telephone Encounter (Signed)
OK 

## 2019-03-07 NOTE — Telephone Encounter (Signed)
The patient has no cardiac contraindication to the planned GI procedures. Marland Kitchen

## 2019-03-13 ENCOUNTER — Encounter: Payer: Self-pay | Admitting: Internal Medicine

## 2019-03-19 DIAGNOSIS — Z0279 Encounter for issue of other medical certificate: Secondary | ICD-10-CM

## 2019-03-19 NOTE — Telephone Encounter (Signed)
Forms have been signed, Copy sent to scan &Charged for. NO fax number to send it too.   LVM to inform patient the original is ready to be picked up & A mychart message has been sent.

## 2019-03-26 ENCOUNTER — Encounter: Payer: Self-pay | Admitting: Gastroenterology

## 2019-03-29 ENCOUNTER — Telehealth: Payer: Self-pay | Admitting: Gastroenterology

## 2019-03-29 NOTE — Telephone Encounter (Signed)

## 2019-03-30 ENCOUNTER — Ambulatory Visit (AMBULATORY_SURGERY_CENTER): Payer: 59 | Admitting: Gastroenterology

## 2019-03-30 ENCOUNTER — Other Ambulatory Visit: Payer: Self-pay

## 2019-03-30 ENCOUNTER — Encounter: Payer: Self-pay | Admitting: Gastroenterology

## 2019-03-30 VITALS — BP 136/96 | HR 66 | Temp 98.3°F | Resp 23 | Ht 62.0 in | Wt 190.0 lb

## 2019-03-30 DIAGNOSIS — Z800 Family history of malignant neoplasm of digestive organs: Secondary | ICD-10-CM

## 2019-03-30 DIAGNOSIS — Z1211 Encounter for screening for malignant neoplasm of colon: Secondary | ICD-10-CM | POA: Diagnosis not present

## 2019-03-30 DIAGNOSIS — K259 Gastric ulcer, unspecified as acute or chronic, without hemorrhage or perforation: Secondary | ICD-10-CM | POA: Diagnosis not present

## 2019-03-30 DIAGNOSIS — Z1509 Genetic susceptibility to other malignant neoplasm: Secondary | ICD-10-CM | POA: Diagnosis not present

## 2019-03-30 DIAGNOSIS — K3189 Other diseases of stomach and duodenum: Secondary | ICD-10-CM | POA: Diagnosis not present

## 2019-03-30 DIAGNOSIS — E739 Lactose intolerance, unspecified: Secondary | ICD-10-CM

## 2019-03-30 DIAGNOSIS — R197 Diarrhea, unspecified: Secondary | ICD-10-CM | POA: Diagnosis not present

## 2019-03-30 MED ORDER — SODIUM CHLORIDE 0.9 % IV SOLN: 500.0000 mL | Freq: Once | INTRAVENOUS | Status: DC

## 2019-03-30 MED ORDER — PANTOPRAZOLE SODIUM 40 MG PO TBEC: 40.0000 mg | DELAYED_RELEASE_TABLET | Freq: Every day | ORAL | 0 refills | Status: DC

## 2019-03-30 NOTE — Patient Instructions (Signed)
Handouts given for gastritis, diverticulosis and hemorrhoids.  No Aspirin, Ibuprofen, Naproxen, or other NSAIDS.  You may use Tylenol.  Pick up your new prescription at the pharmacy.  YOU HAD AN ENDOSCOPIC PROCEDURE TODAY AT [facility]:   Refer to the procedure report that was given to you for any specific questions about what was found during the examination.  If the procedure report does not answer your questions, please call your gastroenterologist to clarify.  If you requested that your care partner not be given the details of your procedure findings, then the procedure report has been included in a sealed envelope for you to review at your convenience later.  YOU SHOULD EXPECT: Some feelings of bloating in the abdomen. Passage of more gas than usual.  Walking can help get rid of the air that was put into your GI tract during the procedure and reduce the bloating. If you had a lower endoscopy (such as a colonoscopy or flexible sigmoidoscopy) you may notice spotting of blood in your stool or on the toilet paper. If you underwent a bowel prep for your procedure, you may not have a normal bowel movement for a few days.  Please Note:  You might notice some irritation and congestion in your nose or some drainage.  This is from the oxygen used during your procedure.  There is no need for concern and it should clear up in a day or so.  SYMPTOMS TO REPORT IMMEDIATELY:   Following lower endoscopy (colonoscopy or flexible sigmoidoscopy):  Excessive amounts of blood in the stool  Significant tenderness or worsening of abdominal pains  Swelling of the abdomen that is new, acute  Fever of 100F or higher   Following upper endoscopy (EGD)  Vomiting of blood or coffee ground material  New chest pain or pain under the shoulder blades  Painful or persistently difficult swallowing  New shortness of breath  Fever of 100F or higher  Black, tarry-looking stools  For urgent or emergent  issues, a gastroenterologist can be reached at any hour by calling [phone].   DIET:  We do recommend a small meal at first, but then you may proceed to your regular diet.  Drink plenty of fluids but you should avoid alcoholic beverages for 24 hours.  ACTIVITY:  You should plan to take it easy for the rest of today and you should NOT DRIVE or use heavy machinery until tomorrow (because of the sedation medicines used during the test).    FOLLOW UP: Our staff will call the number listed on your records 48-72 hours following your procedure to check on you and address any questions or concerns that you may have regarding the information given to you following your procedure. If we do not reach you, we will leave a message.  We will attempt to reach you two times.  During this call, we will ask if you have developed any symptoms of COVID 19. If you develop any symptoms (ie: fever, flu-like symptoms, shortness of breath, cough etc.) before then, please call [phone].  If you test positive for Covid 19 in the 2 weeks post procedure, please call and report this information to [place].    If any biopsies were taken you will be contacted by phone or by letter within the next 1-3 weeks.  Please call us at [phone] if you have not heard about the biopsies in 3 weeks.    SIGNATURES/CONFIDENTIALITY: You and/or your care partner have signed paperwork which  will be entered into your electronic medical record.  These signatures attest to the fact that that the information above on your After Visit Summary has been reviewed and is understood.  Full responsibility of the confidentiality of this discharge information lies with you and/or your care-partner.

## 2019-03-30 NOTE — Progress Notes (Signed)
Report given to PACU, vss 

## 2019-03-30 NOTE — Op Note (Signed)
[facility] Patient Name: [name] Procedure Date: 03/30/2019 10:58 AM MRN: [id number] Endoscopist: Ladene Artist , MD Age: 43 Referring MD:  Date of Birth: Oct 22, 1975 Gender: Female Account #: [id number] Procedure:                Colonoscopy Indications:              Colon cancer screening in patient at increased                            risk: Family history of hereditary nonpolyposis                            colorectal cancer in 1st-degree relative. Personal                            history of Lynch syndrome, PMS2. Medicines:                Monitored Anesthesia Care Procedure:                Pre-Anesthesia Assessment:                           - Prior to the procedure, a History and Physical                            was performed, and patient medications and                            allergies were reviewed. The patient's tolerance of                            previous anesthesia was also reviewed. The risks                            and benefits of the procedure and the sedation                            options and risks were discussed with the patient.                            All questions were answered, and informed consent                            was obtained. Prior Anticoagulants: The patient has                            taken no previous anticoagulant or antiplatelet                            agents. ASA Grade Assessment: II - A patient with                            mild systemic disease. After reviewing the risks  and benefits, the patient was deemed in                            satisfactory condition to undergo the procedure.                           After obtaining informed consent, the colonoscope                            was passed under direct vision. Throughout the                            procedure, the patient's blood pressure, pulse, and                            oxygen saturations were  monitored continuously. The                            Colonoscope was introduced through the anus and                            advanced to the the cecum, identified by                            appendiceal orifice and ileocecal valve. The                            ileocecal valve, appendiceal orifice, and rectum                            were photographed. The quality of the bowel                            preparation was excellent. The colonoscopy was                            performed without difficulty. The patient tolerated                            the procedure well. Scope In: 11:05:15 AM Scope Out: 11:17:51 AM Scope Withdrawal Time: 0 hours 9 minutes 21 seconds  Total Procedure Duration: 0 hours 12 minutes 36 seconds  Findings:                 The perianal and digital rectal examinations were                            normal.                           A few medium-mouthed scattered diverticula were                            found in the entire colon.  Internal hemorrhoids were found during                            retroflexion. The hemorrhoids were small and Grade                            I (internal hemorrhoids that do not prolapse).                           The exam was otherwise without abnormality on                            direct and retroflexion views. Complications:            No immediate complications. Estimated blood loss:                            None. Estimated Blood Loss:     Estimated blood loss: none. Impression:               - Mild diverticulosis in the entire examined colon.                           - Internal hemorrhoids.                           - The examination was otherwise normal on direct                            and retroflexion views.                           - No specimens collected. Recommendation:           - Repeat colonoscopy in 1 year for screening                            purposes.                            - Patient has a contact number available for                            emergencies. The signs and symptoms of potential                            delayed complications were discussed with the                            patient. Return to normal activities tomorrow.                            Written discharge instructions were provided to the                            patient.                           -  Resume previous diet.                           - Continue present medications. Ladene Artist, MD 03/30/2019 11:27:51 AM This report has been signed electronically.

## 2019-03-30 NOTE — Progress Notes (Signed)
Called to room to assist during endoscopic procedure.  Patient ID and intended procedure confirmed with present staff. Received instructions for my participation in the procedure from the performing physician.  

## 2019-03-30 NOTE — Op Note (Signed)
[place] Patient Name: [name] Procedure Date: 03/30/2019 10:57 AM MRN: [id number] Endoscopist: Ladene Artist , MD Age: 43 Referring MD:  Date of Birth: Feb 08, 1976 Gender: Female Account #: [id number] Procedure:                Upper GI endoscopy Indications:              Screening procedure. Personal history of Lynch                            syndrome, PMS2. Medicines:                Monitored Anesthesia Care Procedure:                Pre-Anesthesia Assessment:                           - Prior to the procedure, a History and Physical                            was performed, and patient medications and                            allergies were reviewed. The patient's tolerance of                            previous anesthesia was also reviewed. The risks                            and benefits of the procedure and the sedation                            options and risks were discussed with the patient.                            All questions were answered, and informed consent                            was obtained. Prior Anticoagulants: The patient has                            taken no previous anticoagulant or antiplatelet                            agents. ASA Grade Assessment: II - A patient with                            mild systemic disease. After reviewing the risks                            and benefits, the patient was deemed in                            satisfactory condition to undergo the procedure.  After obtaining informed consent, the endoscope was                            passed under direct vision. Throughout the                            procedure, the patient's blood pressure, pulse, and                            oxygen saturations were monitored continuously. The                            Endoscope was introduced through the mouth, and                            advanced to the second part of  duodenum. The upper                            GI endoscopy was accomplished without difficulty.                            The patient tolerated the procedure well. Scope In: Scope Out: Findings:                 The examined esophagus was normal.                           Multiple dispersed, small non-bleeding erosions                            were found in the gastric antrum. There were no                            stigmata of recent bleeding. Biopsies were taken                            with a cold forceps for histology.                           The exam of the stomach was otherwise normal.                           The duodenal bulb and second portion of the                            duodenum were normal. Complications:            No immediate complications. Estimated Blood Loss:     Estimated blood loss was minimal. Impression:               - Normal esophagus.                           - Non-bleeding erosive gastropathy. Biopsied.                           -  Normal duodenal bulb and second portion of the                            duodenum. Recommendation:           - Patient has a contact number available for                            emergencies. The signs and symptoms of potential                            delayed complications were discussed with the                            patient. Return to normal activities tomorrow.                            Written discharge instructions were provided to the                            patient.                           - Resume previous diet.                           - Continue present medications.                           - Await pathology results.                           - No aspirin, ibuprofen, naproxen, or other                            non-steroidal anti-inflammatory drugs.                           - Protonix (pantoprazole) 40 mg PO daily for 2                            months.                           -  Repeat upper endoscopy in 3 years for screening                            purposes. Ladene Artist, MD 03/30/2019 11:33:17 AM This report has been signed electronically.

## 2019-03-30 NOTE — Progress Notes (Signed)
Pt's states no medical or surgical changes since previsit or office visit.  Covid- June Vitals- Courtney  

## 2019-04-01 LAB — HM COLONOSCOPY

## 2019-04-02 ENCOUNTER — Telehealth: Payer: Self-pay

## 2019-04-02 NOTE — Telephone Encounter (Signed)
  Follow up Call-  Call back number 03/30/2019 03/27/2018 01/18/2017  Post procedure Call Back phone  # [phone]  Permission to leave phone message Yes Yes Yes  Some recent data might be hidden     Patient questions:  Do you have a fever, pain , or abdominal swelling? No. Pain Score  0 *  Have you tolerated food without any problems? Yes.    Have you been able to return to your normal activities? Yes.    Do you have any questions about your discharge instructions: Diet   No. Medications  No. Follow up visit  No.  Do you have questions or concerns about your Care? No.  Actions: * If pain score is 4 or above: No action needed, pain <4.  1. Have you developed a fever since your procedure? no  2.   Have you had an respiratory symptoms (SOB or cough) since your procedure? no  3.   Have you tested positive for COVID 19 since your procedure no  4.   Have you had any family members/close contacts diagnosed with the COVID 19 since your procedure?  no   If yes to any of these questions please route to Joylene John, RN and Alphonsa Gin, Therapist, sports.

## 2019-04-05 ENCOUNTER — Encounter: Payer: Self-pay | Admitting: Gastroenterology

## 2019-04-05 ENCOUNTER — Other Ambulatory Visit: Payer: Self-pay

## 2019-04-05 MED ORDER — HYDROCORTISONE (PERIANAL) 2.5 % EX CREA: 1.0000 "application " | TOPICAL_CREAM | Freq: Two times a day (BID) | CUTANEOUS | 1 refills | Status: DC

## 2019-04-19 ENCOUNTER — Encounter: Payer: Self-pay | Admitting: Internal Medicine

## 2019-04-29 ENCOUNTER — Other Ambulatory Visit: Payer: Self-pay

## 2019-04-29 DIAGNOSIS — Z20822 Contact with and (suspected) exposure to covid-19: Secondary | ICD-10-CM

## 2019-04-29 MED ORDER — METOPROLOL TARTRATE 50 MG PO TABS: 50.0000 mg | ORAL_TABLET | Freq: Two times a day (BID) | ORAL | 3 refills | Status: DC

## 2019-04-30 LAB — NOVEL CORONAVIRUS, NAA: SARS-CoV-2, NAA: NOT DETECTED

## 2019-05-21 ENCOUNTER — Encounter: Payer: Self-pay | Admitting: Internal Medicine

## 2019-05-23 ENCOUNTER — Ambulatory Visit: Payer: 59 | Admitting: Internal Medicine

## 2019-05-27 ENCOUNTER — Ambulatory Visit: Payer: 59 | Admitting: Internal Medicine

## 2019-05-27 NOTE — Progress Notes (Signed)
   Subjective:  Patient ID: [name], female    DOB: [birth year]  Age: 43 y.o. MRN: [id number]  CC: No chief complaint on file.  Not seen   HPI [name] presents for   Outpatient Medications Prior to Visit  Medication Sig Dispense Refill  . ferrous sulfate 325 (65 FE) MG tablet Take 1 tablet (325 mg total) by mouth 2 (two) times daily with a meal. (Patient not taking: Reported on 03/30/2019) 180 tablet 0  . hydrocortisone (ANUSOL-HC) 2.5 % rectal cream Place 1 application rectally 2 (two) times daily. 30 g 1  . losartan (COZAAR) 25 MG tablet Take 1 tablet (25 mg total) by mouth daily. 90 tablet 1  . metoprolol tartrate (LOPRESSOR) 50 MG tablet Take 1 tablet (50 mg total) by mouth 2 (two) times daily. 30 tablet 3  . pantoprazole (PROTONIX) 40 MG tablet Take 1 tablet (40 mg total) by mouth daily. 60 tablet 0  . tazarotene (AVAGE) 0.1 % cream Apply 1 application topically at bedtime.   2  . torsemide (DEMADEX) 10 MG tablet Take 1 tablet (10 mg total) by mouth daily. (Patient not taking: Reported on 03/30/2019) 90 tablet 1  . Ubrogepant (UBRELVY) 100 MG TABS Take 1 tablet by mouth as needed. (Patient not taking: Reported on 03/30/2019) 10 tablet 5   No facility-administered medications prior to visit.     ROS Review of Systems  Objective:  LMP 01/12/2012   BP Readings from Last 3 Encounters:  03/30/19 (!) 136/96  03/05/19 100/74  03/04/19 124/86    Wt Readings from Last 3 Encounters:  03/30/19 190 lb (86.2 kg)  03/05/19 190 lb (86.2 kg)  03/04/19 189 lb (85.7 kg)    Physical Exam  Lab Results  Component Value Date   WBC 6.7 03/04/2019   HGB 14.0 03/04/2019   HCT 43.1 03/04/2019   PLT 279.0 03/04/2019   GLUCOSE 113 (H) 12/06/2018   CHOL 179 07/10/2018   TRIG 142.0 07/10/2018   HDL 54.70 07/10/2018   LDLCALC 96 07/10/2018   ALT 38 12/06/2018   AST 23 12/06/2018   NA 136 12/06/2018   K 4.0 12/06/2018   CL 103 12/06/2018   CREATININE 0.91  12/06/2018   BUN <5 (L) 12/06/2018   CO2 22 12/06/2018   TSH 2.69 03/04/2019   INR 2.3 02/05/2019   HGBA1C 5.5 11/19/2018    No results found.  Assessment & Plan:   There are no diagnoses linked to this encounter. I am having [name] maintain her tazarotene, Ubrogepant, ferrous sulfate, losartan, torsemide, pantoprazole, hydrocortisone, and metoprolol tartrate.  No orders of the defined types were placed in this encounter.    Follow-up: No follow-ups on file.  [name], MD

## 2019-05-28 ENCOUNTER — Ambulatory Visit: Payer: 59 | Admitting: Internal Medicine

## 2019-06-10 ENCOUNTER — Other Ambulatory Visit (INDEPENDENT_AMBULATORY_CARE_PROVIDER_SITE_OTHER): Payer: 59

## 2019-06-10 ENCOUNTER — Ambulatory Visit (INDEPENDENT_AMBULATORY_CARE_PROVIDER_SITE_OTHER): Payer: 59 | Admitting: Internal Medicine

## 2019-06-10 ENCOUNTER — Other Ambulatory Visit: Payer: Self-pay

## 2019-06-10 ENCOUNTER — Encounter: Payer: Self-pay | Admitting: Internal Medicine

## 2019-06-10 VITALS — BP 136/86 | HR 79 | Temp 98.0°F | Resp 16 | Ht 62.0 in | Wt 192.8 lb

## 2019-06-10 DIAGNOSIS — I10 Essential (primary) hypertension: Secondary | ICD-10-CM | POA: Diagnosis not present

## 2019-06-10 DIAGNOSIS — D508 Other iron deficiency anemias: Secondary | ICD-10-CM

## 2019-06-10 DIAGNOSIS — N951 Menopausal and female climacteric states: Secondary | ICD-10-CM

## 2019-06-10 DIAGNOSIS — E038 Other specified hypothyroidism: Secondary | ICD-10-CM | POA: Diagnosis not present

## 2019-06-10 DIAGNOSIS — Z5181 Encounter for therapeutic drug level monitoring: Secondary | ICD-10-CM

## 2019-06-10 DIAGNOSIS — Z7901 Long term (current) use of anticoagulants: Secondary | ICD-10-CM

## 2019-06-10 LAB — CBC WITH DIFFERENTIAL/PLATELET
Basophils Absolute: 0.1 10*3/uL (ref 0.0–0.1)
Basophils Relative: 0.8 % (ref 0.0–3.0)
Eosinophils Absolute: 0.1 10*3/uL (ref 0.0–0.7)
Eosinophils Relative: 1.3 % (ref 0.0–5.0)
HCT: 43.1 % (ref 36.0–46.0)
Hemoglobin: 14.1 g/dL (ref 12.0–15.0)
Lymphocytes Relative: 17.7 % (ref 12.0–46.0)
Lymphs Abs: 1.4 10*3/uL (ref 0.7–4.0)
MCHC: 32.6 g/dL (ref 30.0–36.0)
MCV: 91.1 fl (ref 78.0–100.0)
Monocytes Absolute: 0.5 10*3/uL (ref 0.1–1.0)
Monocytes Relative: 6.5 % (ref 3.0–12.0)
Neutro Abs: 5.9 10*3/uL (ref 1.4–7.7)
Neutrophils Relative %: 73.7 % (ref 43.0–77.0)
Platelets: 283 10*3/uL (ref 150.0–400.0)
RBC: 4.73 Mil/uL (ref 3.87–5.11)
RDW: 14.2 % (ref 11.5–15.5)
WBC: 8 10*3/uL (ref 4.0–10.5)

## 2019-06-10 LAB — BASIC METABOLIC PANEL
BUN: 15 mg/dL (ref 6–23)
CO2: 26 mEq/L (ref 19–32)
Calcium: 9.5 mg/dL (ref 8.4–10.5)
Chloride: 104 mEq/L (ref 96–112)
Creatinine, Ser: 1.27 mg/dL — ABNORMAL HIGH (ref 0.40–1.20)
GFR: 55.09 mL/min — ABNORMAL LOW (ref >60.00)
Glucose, Bld: 85 mg/dL (ref 70–99)
Potassium: 3.7 mEq/L (ref 3.5–5.1)
Sodium: 139 mEq/L (ref 135–145)

## 2019-06-10 LAB — IBC PANEL
Iron: 98 ug/dL (ref 42–145)
Saturation Ratios: 28.2 % (ref 20.0–50.0)
Transferrin: 248 mg/dL (ref 212.0–360.0)

## 2019-06-10 LAB — FOLLICLE STIMULATING HORMONE: FSH: 11.2 m[IU]/mL

## 2019-06-10 LAB — FERRITIN: Ferritin: 45.5 ng/mL (ref 10.0–291.0)

## 2019-06-10 LAB — TSH: TSH: 2.89 u[IU]/mL (ref 0.35–4.50)

## 2019-06-10 LAB — LUTEINIZING HORMONE: LH: 9.86 m[IU]/mL

## 2019-06-10 NOTE — Patient Instructions (Signed)

## 2019-06-10 NOTE — Progress Notes (Signed)
Subjective:  Patient ID: [name], female    DOB: 26-Jan-1976  Age: 43 y.o. MRN: [id number]  CC: Hypertension   HPI [name] presents for f/up - She complains of weight gain, hot flashes, and night sweats.  She wants to check her hormones to see if she is going through menopause.  She is status post hysterectomy.  Outpatient Medications Prior to Visit  Medication Sig Dispense Refill  . losartan (COZAAR) 25 MG tablet Take 1 tablet (25 mg total) by mouth daily. 90 tablet 1  . metoprolol tartrate (LOPRESSOR) 50 MG tablet Take 1 tablet (50 mg total) by mouth 2 (two) times daily. 30 tablet 3  . tazarotene (AVAGE) 0.1 % cream Apply 1 application topically at bedtime.   2  . torsemide (DEMADEX) 10 MG tablet Take 1 tablet (10 mg total) by mouth daily. (Patient not taking: Reported on 03/30/2019) 90 tablet 1  . Ubrogepant (UBRELVY) 100 MG TABS Take 1 tablet by mouth as needed. (Patient not taking: Reported on 03/30/2019) 10 tablet 5  . ferrous sulfate 325 (65 FE) MG tablet Take 1 tablet (325 mg total) by mouth 2 (two) times daily with a meal. (Patient not taking: Reported on 03/30/2019) 180 tablet 0  . hydrocortisone (ANUSOL-HC) 2.5 % rectal cream Place 1 application rectally 2 (two) times daily. 30 g 1  . pantoprazole (PROTONIX) 40 MG tablet Take 1 tablet (40 mg total) by mouth daily. 60 tablet 0   No facility-administered medications prior to visit.     ROS Review of Systems  Constitutional: Positive for unexpected weight change. Negative for chills, diaphoresis, fatigue and fever.  HENT: Negative.   Eyes: Negative for visual disturbance.  Respiratory: Negative for cough, chest tightness, shortness of breath and wheezing.   Cardiovascular: Negative for chest pain, palpitations and leg swelling.  Gastrointestinal: Negative for abdominal pain, constipation, diarrhea, nausea and vomiting.  Endocrine: Negative.   Genitourinary: Negative.  Negative for difficulty  urinating, dysuria and hematuria.  Musculoskeletal: Negative.  Negative for arthralgias and myalgias.  Skin: Negative.  Negative for color change.  Neurological: Negative.  Negative for dizziness, weakness, light-headedness and headaches.  Hematological: Negative for adenopathy. Does not bruise/bleed easily.  Psychiatric/Behavioral: Negative.     Objective:  BP 136/86 (BP Location: Left Arm, Patient Position: Sitting, Cuff Size: Normal)   Pulse 79   Temp 98 F (36.7 C) (Oral)   Resp 16   Ht 5\' 2"  (1.575 m)   Wt 192 lb 12 oz (87.4 kg)   LMP 01/12/2012   SpO2 99%   BMI 35.25 kg/m   BP Readings from Last 3 Encounters:  06/10/19 136/86  03/30/19 (!) 136/96  03/05/19 100/74    Wt Readings from Last 3 Encounters:  06/10/19 192 lb 12 oz (87.4 kg)  03/30/19 190 lb (86.2 kg)  03/05/19 190 lb (86.2 kg)    Physical Exam Vitals signs reviewed.  Constitutional:      Appearance: She is obese.  HENT:     Nose: Nose normal.     Mouth/Throat:     Mouth: Mucous membranes are moist.  Eyes:     General: No scleral icterus.    Conjunctiva/sclera: Conjunctivae normal.  Neck:     Musculoskeletal: Neck supple.  Cardiovascular:     Rate and Rhythm: Normal rate and regular rhythm.     Pulses: Normal pulses.     Heart sounds: No murmur.  Pulmonary:     Effort: Pulmonary effort is normal.  Breath sounds: No stridor. No wheezing, rhonchi or rales.  Abdominal:     General: Abdomen is flat. Bowel sounds are normal. There is no distension.     Palpations: Abdomen is soft. There is no hepatomegaly or splenomegaly.     Tenderness: There is no abdominal tenderness.  Musculoskeletal: Normal range of motion.     Right lower leg: No edema.     Left lower leg: No edema.  Lymphadenopathy:     Cervical: No cervical adenopathy.  Skin:    General: Skin is warm and dry.     Coloration: Skin is not pale.  Neurological:     General: No focal deficit present.     Mental Status: She is alert.   Psychiatric:        Mood and Affect: Mood normal.        Behavior: Behavior normal.     Lab Results  Component Value Date   WBC 8.0 06/10/2019   HGB 14.1 06/10/2019   HCT 43.1 06/10/2019   PLT 283.0 06/10/2019   GLUCOSE 85 06/10/2019   CHOL 179 07/10/2018   TRIG 142.0 07/10/2018   HDL 54.70 07/10/2018   LDLCALC 96 07/10/2018   ALT 38 12/06/2018   AST 23 12/06/2018   NA 139 06/10/2019   K 3.7 06/10/2019   CL 104 06/10/2019   CREATININE 1.27 (H) 06/10/2019   BUN 15 06/10/2019   CO2 26 06/10/2019   TSH 2.89 06/10/2019   INR 2.3 02/05/2019   HGBA1C 5.5 11/19/2018    No results found.  Assessment & Plan:   [name] was seen today for hypertension.  Diagnoses and all orders for this visit:  Other specified hypothyroidism- Her TSH is normal.  Thyroid replacement therapy is not indicated. -     TSH; Future  Anticoagulation goal of INR 2.0 to 2.5 -     Cancel: Protime-INR; Future  Anticoagulant long-term use -     Cancel: Protime-INR; Future  Iron deficiency anemia secondary to inadequate dietary iron intake- Her H&H are normal now.  Iron replacement therapy is not indicated. -     CBC with Differential; Future -     IBC panel; Future -     Ferritin; Future  Essential hypertension- Her blood pressure is adequately well controlled.  She has had a very slight decrease in her renal function.  I encouraged her to avoid nephrotoxic agents. -     Basic metabolic panel; Future  Menopausal hot flushes- Her LH and FSH are normal.  She does not appear to be perimenopausal or menopausal. -     Luteinizing hormone (LH); Future -     FSH- Follicle stimulating hormone; Future   I have discontinued [name]'s ferrous sulfate, pantoprazole, and hydrocortisone. I am also having her maintain her tazarotene, Ubrogepant, losartan, torsemide, and metoprolol tartrate.  No orders of the defined types were placed in this encounter.    Follow-up: Return in about 6  months (around 12/09/2019).  [name] Calico, MD

## 2019-06-11 ENCOUNTER — Encounter: Payer: Self-pay | Admitting: Internal Medicine

## 2019-06-14 ENCOUNTER — Other Ambulatory Visit (INDEPENDENT_AMBULATORY_CARE_PROVIDER_SITE_OTHER): Payer: 59

## 2019-06-14 ENCOUNTER — Other Ambulatory Visit: Payer: Self-pay | Admitting: Internal Medicine

## 2019-06-14 DIAGNOSIS — R300 Dysuria: Secondary | ICD-10-CM

## 2019-06-14 LAB — URINALYSIS, ROUTINE W REFLEX MICROSCOPIC
Bilirubin Urine: NEGATIVE
Hgb urine dipstick: NEGATIVE
Ketones, ur: NEGATIVE
Leukocytes,Ua: NEGATIVE
Nitrite: NEGATIVE
Specific Gravity, Urine: 1.02 (ref 1.000–1.030)
Total Protein, Urine: NEGATIVE
Urine Glucose: NEGATIVE
Urobilinogen, UA: 0.2 (ref 0.0–1.0)
pH: 6.5 (ref 5.0–8.0)

## 2019-06-15 ENCOUNTER — Encounter: Payer: Self-pay | Admitting: Internal Medicine

## 2019-06-16 LAB — CULTURE, URINE COMPREHENSIVE
MICRO NUMBER:: 1189478
SPECIMEN QUALITY:: ADEQUATE

## 2019-06-17 ENCOUNTER — Encounter: Payer: Self-pay | Admitting: Internal Medicine

## 2019-06-17 ENCOUNTER — Ambulatory Visit (INDEPENDENT_AMBULATORY_CARE_PROVIDER_SITE_OTHER)
Admission: RE | Admit: 2019-06-17 | Discharge: 2019-06-17 | Disposition: A | Discharge status: Home / Self Care | Payer: 59 | Source: Ambulatory Visit | Attending: Internal Medicine | Admitting: Internal Medicine

## 2019-06-17 ENCOUNTER — Other Ambulatory Visit: Payer: Self-pay

## 2019-06-17 DIAGNOSIS — M545 Low back pain, unspecified: Secondary | ICD-10-CM

## 2019-06-17 NOTE — Addendum Note (Signed)
Addended by: Karle Barr on: 06/17/2019 03:48 PM   Modules accepted: Orders

## 2019-06-18 ENCOUNTER — Other Ambulatory Visit: Payer: Self-pay | Admitting: Internal Medicine

## 2019-06-18 ENCOUNTER — Encounter: Payer: Self-pay | Admitting: Internal Medicine

## 2019-06-18 DIAGNOSIS — M545 Low back pain, unspecified: Secondary | ICD-10-CM

## 2019-06-18 DIAGNOSIS — M9979 Connective tissue and disc stenosis of intervertebral foramina of abdomen and other regions: Secondary | ICD-10-CM | POA: Insufficient documentation
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
## 2019-07-16 ENCOUNTER — Other Ambulatory Visit: Payer: Self-pay

## 2019-07-16 ENCOUNTER — Ambulatory Visit: Payer: 59 | Admitting: Family Medicine

## 2019-07-16 ENCOUNTER — Ambulatory Visit (INDEPENDENT_AMBULATORY_CARE_PROVIDER_SITE_OTHER): Payer: 59 | Admitting: Cardiology

## 2019-07-16 ENCOUNTER — Encounter: Payer: Self-pay | Admitting: Cardiology

## 2019-07-16 VITALS — BP 120/72 | HR 87 | Ht 62.0 in | Wt 193.4 lb

## 2019-07-16 DIAGNOSIS — R002 Palpitations: Secondary | ICD-10-CM | POA: Diagnosis not present

## 2019-07-16 DIAGNOSIS — Z7901 Long term (current) use of anticoagulants: Secondary | ICD-10-CM

## 2019-07-16 DIAGNOSIS — Z7189 Other specified counseling: Secondary | ICD-10-CM

## 2019-07-16 DIAGNOSIS — Z9889 Other specified postprocedural states: Secondary | ICD-10-CM

## 2019-07-16 DIAGNOSIS — I10 Essential (primary) hypertension: Secondary | ICD-10-CM | POA: Diagnosis not present

## 2019-07-16 DIAGNOSIS — I5032 Chronic diastolic (congestive) heart failure: Secondary | ICD-10-CM

## 2019-07-16 DIAGNOSIS — R0602 Shortness of breath: Secondary | ICD-10-CM

## 2019-07-16 MED ORDER — TORSEMIDE 10 MG PO TABS: 10.0000 mg | ORAL_TABLET | Freq: Two times a day (BID) | ORAL | 3 refills | Status: DC

## 2019-07-16 MED ORDER — LOSARTAN POTASSIUM 50 MG PO TABS: 50.0000 mg | ORAL_TABLET | Freq: Every day | ORAL | 3 refills | Status: DC

## 2019-07-16 NOTE — Patient Instructions (Signed)
Medication Instructions:  Increase Torsemide to 10mg  take 2 tablets daily Increase Cozaar to 50mg  daily *If you need a refill on your cardiac medications before your next appointment, please call your pharmacy*  Lab Work: Your physician recommends that you return for lab work today (BNP, BMP)  If you have labs (blood work) drawn today and your tests are completely normal, you will receive your results only by: Marland Kitchen MyChart Message (if you have MyChart) OR . A paper copy in the mail If you have any lab test that is abnormal or we need to change your treatment, we will call you to review the results.  Testing/Procedures: A chest x-ray takes a picture of the organs and structures inside the chest, including the heart, lungs, and blood vessels. This test can show several things, including, whether the heart is enlarges; whether fluid is building up in the lungs; and whether pacemaker / defibrillator leads are still in place. [place]  Follow-Up: At [facility], you and your health needs are our priority.  As part of our continuing mission to provide you with exceptional heart care, we have created designated Provider Care Teams.  These Care Teams include your primary Cardiologist (physician) and Advanced Practice Providers (APPs -  Physician Assistants and Nurse Practitioners) who all work together to provide you with the care you need, when you need it.  Your next appointment:   1 month(s)  The format for your next appointment:   In Person  Provider:   [name], MD

## 2019-07-17 ENCOUNTER — Encounter: Payer: Self-pay | Admitting: Internal Medicine

## 2019-07-17 ENCOUNTER — Ambulatory Visit
Admission: RE | Admit: 2019-07-17 | Discharge: 2019-07-17 | Disposition: A | Discharge status: Home / Self Care | Payer: 59 | Source: Ambulatory Visit | Attending: Cardiology | Admitting: Cardiology

## 2019-07-17 DIAGNOSIS — R0602 Shortness of breath: Secondary | ICD-10-CM

## 2019-07-17 LAB — BASIC METABOLIC PANEL
BUN/Creatinine Ratio: 9 (ref 9–23)
BUN: 11 mg/dL (ref 6–24)
CO2: 23 mmol/L (ref 20–29)
Calcium: 10 mg/dL (ref 8.7–10.2)
Chloride: 97 mmol/L (ref 96–106)
Creatinine, Ser: 1.2 mg/dL — ABNORMAL HIGH (ref 0.57–1.00)
GFR calc Af Amer: 64 mL/min/{1.73_m2} (ref >59)
GFR calc non Af Amer: 55 mL/min/{1.73_m2} — ABNORMAL LOW (ref >59)
Glucose: 90 mg/dL (ref 65–99)
Potassium: 4 mmol/L (ref 3.5–5.2)
Sodium: 140 mmol/L (ref 134–144)

## 2019-07-17 LAB — BRAIN NATRIURETIC PEPTIDE: BNP: 13.3 pg/mL (ref 0.0–100.0)

## 2019-07-18 ENCOUNTER — Ambulatory Visit: Payer: 59 | Admitting: Family Medicine
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
## 2019-07-29 ENCOUNTER — Other Ambulatory Visit: Payer: Self-pay

## 2019-07-29 ENCOUNTER — Ambulatory Visit: Payer: 59 | Attending: Internal Medicine

## 2019-07-29 DIAGNOSIS — Z20822 Contact with and (suspected) exposure to covid-19: Secondary | ICD-10-CM

## 2019-07-30 LAB — NOVEL CORONAVIRUS, NAA: SARS-CoV-2, NAA: NOT DETECTED

## 2019-07-31 ENCOUNTER — Other Ambulatory Visit: Payer: Self-pay | Admitting: Adult Health

## 2019-08-28 DIAGNOSIS — R0602 Shortness of breath: Secondary | ICD-10-CM | POA: Insufficient documentation
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
## 2019-08-30 ENCOUNTER — Other Ambulatory Visit: Payer: Self-pay

## 2019-08-30 ENCOUNTER — Encounter: Payer: Self-pay | Admitting: Cardiology

## 2019-08-30 ENCOUNTER — Ambulatory Visit (INDEPENDENT_AMBULATORY_CARE_PROVIDER_SITE_OTHER): Payer: 59 | Admitting: Cardiology

## 2019-08-30 VITALS — BP 116/76 | HR 85 | Ht 63.0 in | Wt 200.2 lb

## 2019-08-30 DIAGNOSIS — I10 Essential (primary) hypertension: Secondary | ICD-10-CM | POA: Diagnosis not present

## 2019-08-30 DIAGNOSIS — Z9889 Other specified postprocedural states: Secondary | ICD-10-CM

## 2019-08-30 DIAGNOSIS — R0602 Shortness of breath: Secondary | ICD-10-CM

## 2019-08-30 DIAGNOSIS — Z7189 Other specified counseling: Secondary | ICD-10-CM

## 2019-08-30 DIAGNOSIS — I420 Dilated cardiomyopathy: Secondary | ICD-10-CM

## 2019-08-30 MED ORDER — METOPROLOL SUCCINATE ER 100 MG PO TB24: 100.0000 mg | ORAL_TABLET | Freq: Every day | ORAL | 3 refills | Status: DC

## 2019-08-30 NOTE — Patient Instructions (Signed)
Medication Instructions:  Stop Metoprolol Tartrate Start Metoprolol Succinate 100mg  daily *If you need a refill on your cardiac medications before your next appointment, please call your pharmacy*  Lab Work: None  Testing/Procedures: Your physician has requested that you have an echocardiogram in May. Echocardiography is a painless test that uses sound waves to create images of your heart. It provides your doctor with information about the size and shape of your heart and how well your heart's chambers and valves are working. This procedure takes approximately one hour. There are no restrictions for this procedure. [place]  Follow-Up: At [facility], you and your health needs are our priority.  As part of our continuing mission to provide you with exceptional heart care, we have created designated Provider Care Teams.  These Care Teams include your primary Cardiologist (physician) and Advanced Practice Providers (APPs -  Physician Assistants and Nurse Practitioners) who all work together to provide you with the care you need, when you need it.  We recommend signing up for the patient portal called "MyChart".  Sign up information is provided on this After Visit Summary.  MyChart is used to connect with patients for Virtual Visits (Telemedicine).  Patients are able to view lab/test results, encounter notes, upcoming appointments, etc.  Non-urgent messages can be sent to your provider as well.   To learn more about what you can do with MyChart, go to [email or web address].    Your next appointment:   6 month(s)  You will receive a reminder letter in the mail two months in advance. If you don't receive a letter, please call our office to schedule the follow-up appointment.   The format for your next appointment:   In Person  Provider:   [name], MD

## 2019-09-05 ENCOUNTER — Other Ambulatory Visit: Payer: Self-pay | Admitting: Internal Medicine

## 2019-09-05 DIAGNOSIS — G43019 Migraine without aura, intractable, without status migrainosus: Secondary | ICD-10-CM

## 2019-09-06 ENCOUNTER — Telehealth: Payer: Self-pay | Admitting: Internal Medicine

## 2019-09-06 NOTE — Telephone Encounter (Signed)
   Patient calling to discuss status of authorization for UBRELVY 100 MG TABS

## 2019-09-12 NOTE — Telephone Encounter (Signed)
Called pt and informed that I was able to get the [place] approved. I sent patient an email link for the copay card.

## 2019-09-18 ENCOUNTER — Encounter: Payer: Self-pay | Admitting: Internal Medicine

## 2019-09-19 ENCOUNTER — Encounter: Payer: Self-pay | Admitting: Family Medicine

## 2019-09-19 ENCOUNTER — Ambulatory Visit: Payer: 59 | Admitting: Family Medicine

## 2019-09-19 ENCOUNTER — Other Ambulatory Visit: Payer: Self-pay | Admitting: Internal Medicine

## 2019-09-19 ENCOUNTER — Other Ambulatory Visit: Payer: Self-pay

## 2019-09-19 DIAGNOSIS — M999 Biomechanical lesion, unspecified: Secondary | ICD-10-CM | POA: Insufficient documentation

## 2019-09-19 DIAGNOSIS — M545 Low back pain, unspecified: Secondary | ICD-10-CM

## 2019-09-19 MED ORDER — VITAMIN D (ERGOCALCIFEROL) 1.25 MG (50000 UNIT) PO CAPS: 50000.0000 [IU] | ORAL_CAPSULE | ORAL | 0 refills | Status: DC

## 2019-09-19 MED ORDER — GABAPENTIN 100 MG PO CAPS: 200.0000 mg | ORAL_CAPSULE | Freq: Every day | ORAL | 3 refills | Status: DC

## 2019-09-19 NOTE — Assessment & Plan Note (Signed)
Patient back pain shows the patient does have some arthritic changes noted.  Moderate nature.  And concern for the potential for congenital spinal stenosis with pain being worse on extension.  Attempted osteopathic manipulation.  Home exercises given, discussed icing regimen.  Topical anti-inflammatories, follow-up again in 4 to 8 weeks

## 2019-09-19 NOTE — Patient Instructions (Addendum)
Good to see you Exercises 3 times a week.  pennsaid pinkie amount topically 2 times daily as needed.  Gabapentin 200 mg at night  Once weekly vitamin D for 12 weeks.  See me again in
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
## 2019-09-19 NOTE — Assessment & Plan Note (Signed)
Decision today to treat with OMT was based on Physical Exam  After verbal consent patient was treated with HVLA, ME, FPR techniques in  thoracic, lumbar and sacral areas  Patient tolerated the procedure well with improvement in symptoms  Patient given exercises, stretches and lifestyle modifications  See medications in patient instructions if given  Patient will follow up in 4-8 weeks 

## 2019-09-19 NOTE — Addendum Note (Signed)
Addended by: Lyndal Pulley on: 09/19/2019 03:46 PM   Modules accepted: Level of Service

## 2019-09-20 ENCOUNTER — Encounter: Payer: Self-pay | Admitting: Internal Medicine

## 2019-09-24 ENCOUNTER — Other Ambulatory Visit: Payer: Self-pay | Admitting: Internal Medicine

## 2019-09-24 DIAGNOSIS — G43019 Migraine without aura, intractable, without status migrainosus: Secondary | ICD-10-CM

## 2019-09-24 MED ORDER — UBRELVY 100 MG PO TABS: 1.0000 | ORAL_TABLET | ORAL | 1 refills | Status: DC | PRN

## 2019-10-24 ENCOUNTER — Ambulatory Visit: Payer: 59 | Admitting: Family Medicine

## 2019-10-29 ENCOUNTER — Other Ambulatory Visit (INDEPENDENT_AMBULATORY_CARE_PROVIDER_SITE_OTHER): Payer: 59

## 2019-10-29 ENCOUNTER — Encounter: Payer: Self-pay | Admitting: Gastroenterology

## 2019-10-29 ENCOUNTER — Ambulatory Visit: Payer: 59 | Admitting: Gastroenterology

## 2019-10-29 ENCOUNTER — Other Ambulatory Visit: Payer: Self-pay

## 2019-10-29 VITALS — BP 102/80 | HR 80 | Temp 98.3°F | Ht 63.0 in | Wt 200.0 lb

## 2019-10-29 DIAGNOSIS — R1011 Right upper quadrant pain: Secondary | ICD-10-CM

## 2019-10-29 DIAGNOSIS — R112 Nausea with vomiting, unspecified: Secondary | ICD-10-CM

## 2019-10-29 LAB — CBC WITH DIFFERENTIAL/PLATELET
Basophils Absolute: 0 10*3/uL (ref 0.0–0.1)
Basophils Relative: 0.5 % (ref 0.0–3.0)
Eosinophils Absolute: 0.1 10*3/uL (ref 0.0–0.7)
Eosinophils Relative: 1.4 % (ref 0.0–5.0)
HCT: 40.2 % (ref 36.0–46.0)
Hemoglobin: 13.3 g/dL (ref 12.0–15.0)
Lymphocytes Relative: 19.5 % (ref 12.0–46.0)
Lymphs Abs: 1.4 10*3/uL (ref 0.7–4.0)
MCHC: 33.2 g/dL (ref 30.0–36.0)
MCV: 90.4 fl (ref 78.0–100.0)
Monocytes Absolute: 0.5 10*3/uL (ref 0.1–1.0)
Monocytes Relative: 6.8 % (ref 3.0–12.0)
Neutro Abs: 5.1 10*3/uL (ref 1.4–7.7)
Neutrophils Relative %: 71.8 % (ref 43.0–77.0)
Platelets: 315 10*3/uL (ref 150.0–400.0)
RBC: 4.45 Mil/uL (ref 3.87–5.11)
RDW: 13.7 % (ref 11.5–15.5)
WBC: 7 10*3/uL (ref 4.0–10.5)

## 2019-10-29 LAB — COMPREHENSIVE METABOLIC PANEL
ALT: 11 U/L (ref 0–35)
AST: 11 U/L (ref 0–37)
Albumin: 3.9 g/dL (ref 3.5–5.2)
Alkaline Phosphatase: 68 U/L (ref 39–117)
BUN: 12 mg/dL (ref 6–23)
CO2: 31 mEq/L (ref 19–32)
Calcium: 9.3 mg/dL (ref 8.4–10.5)
Chloride: 98 mEq/L (ref 96–112)
Creatinine, Ser: 1.09 mg/dL (ref 0.40–1.20)
GFR: 65.6 mL/min (ref >60.00)
Glucose, Bld: 106 mg/dL — ABNORMAL HIGH (ref 70–99)
Potassium: 3.3 mEq/L — ABNORMAL LOW (ref 3.5–5.1)
Sodium: 135 mEq/L (ref 135–145)
Total Bilirubin: 0.5 mg/dL (ref 0.2–1.2)
Total Protein: 6.8 g/dL (ref 6.0–8.3)

## 2019-10-29 NOTE — Patient Instructions (Signed)
If you are age 45 or older, your body mass index should be between 23-30. Your Body mass index is 35.43 kg/m. If this is out of the aforementioned range listed, please consider follow up with your Primary Care Provider.  If you are age 45 or younger, your body mass index should be between 19-25. Your Body mass index is 35.43 kg/m. If this is out of the aformentioned range listed, please consider follow up with your Primary Care Provider.   Your provider has requested that you go to the basement level for lab work before leaving today. Press "B" on the elevator. The lab is located at the first door on the left as you exit the elevator.   You have been scheduled for an abdominal ultrasound at [facility] (1st floor of hospital) on Tuesday 11/12/19 at 7 am. Please arrive 15 minutes prior to your appointment for registration. Make certain not to have anything to eat or drink 6 hours prior to your appointment. Should you need to reschedule your appointment, please contact radiology at [phone]. This test typically takes about 30 minutes to perform.  You have been scheduled for a HIDA scan at [facility] (1st floor) on Tuesday 11/12/19. Please arrive 15 minutes prior to your scheduled appointment at  8 am. Make certain not to have anything to eat or drink at least 6 hours prior to your test. Should this appointment date or time not work well for you, please call radiology scheduling at [phone].  ________________________________________________________________ hepatobiliary (HIDA) scan is an imaging procedure used to diagnose problems in the liver, gallbladder and bile ducts. In the HIDA scan, a radioactive chemical or tracer is injected into a vein in your arm. The tracer is handled by the liver like bile. Bile is a fluid produced and excreted by your liver that helps your digestive system break down fats in the foods you eat. Bile is stored in your gallbladder and the gallbladder  releases the bile when you eat a meal. A special nuclear medicine scanner (gamma camera) tracks the flow of the tracer from your liver into your gallbladder and small intestine.  During your HIDA scan  You'll be asked to change into a hospital gown before your HIDA scan begins. Your health care team will position you on a table, usually on your back. The radioactive tracer is then injected into a vein in your arm.The tracer travels through your bloodstream to your liver, where it's taken up by the bile-producing cells. The radioactive tracer travels with the bile from your liver into your gallbladder and through your bile ducts to your small intestine.You may feel some pressure while the radioactive tracer is injected into your vein. As you lie on the table, a special gamma camera is positioned over your abdomen taking pictures of the tracer as it moves through your body. The gamma camera takes pictures continually for about an hour. You'll need to keep still during the HIDA scan. This can become uncomfortable, but you may find that you can lessen the discomfort by taking deep breaths and thinking about other things. Tell your health care team if you're uncomfortable. The radiologist will watch on a computer the progress of the radioactive tracer through your body. The HIDA scan may be stopped when the radioactive tracer is seen in the gallbladder and enters your small intestine. This typically takes about an hour. In some cases extra imaging will be performed if original images aren't satisfactory, if morphine is given to  help visualize the gallbladder or if the medication CCK is given to look at the contraction of the gallbladder. This test typically takes 2 hours to complete. ______________________________________________________________
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
## 2019-10-29 NOTE — Progress Notes (Signed)
Reviewed and agree with management plan.  [place] Welliver T. Byrl Latin, MD FACG [facility]  

## 2019-11-12 ENCOUNTER — Ambulatory Visit (HOSPITAL_COMMUNITY)
Admission: RE | Admit: 2019-11-12 | Discharge: 2019-11-12 | Disposition: A | Discharge status: Home / Self Care | Payer: 59 | Source: Ambulatory Visit | Attending: Gastroenterology | Admitting: Gastroenterology

## 2019-11-12 ENCOUNTER — Other Ambulatory Visit: Payer: Self-pay

## 2019-11-12 DIAGNOSIS — R112 Nausea with vomiting, unspecified: Secondary | ICD-10-CM | POA: Diagnosis present

## 2019-11-12 DIAGNOSIS — R1011 Right upper quadrant pain: Secondary | ICD-10-CM

## 2019-11-12 MED ORDER — TECHNETIUM TC 99M MEBROFENIN IV KIT
5.3000 | PACK | Freq: Once | INTRAVENOUS | Status: AC
  Administered 2019-11-12: 09:00:00 5.3 via INTRAVENOUS

## 2019-11-13 ENCOUNTER — Telehealth: Payer: Self-pay | Admitting: Gastroenterology

## 2019-11-13 NOTE — Telephone Encounter (Signed)
[name] the pt is calling for HIDA scan results from yesterday.

## 2019-11-13 NOTE — Telephone Encounter (Signed)
Pt would like to speak about results of HIDA scan. She saw them on mychart.

## 2019-11-13 NOTE — Telephone Encounter (Signed)
Her ultrasound was normal.  Her HIDA scan shows biliary dysfunction.  Can refer her to [facility] to discuss cholecystectomy.

## 2019-11-13 NOTE — Telephone Encounter (Signed)
The pt has been advised and referral to [facility] has been made.  Records faxed

## 2019-11-25 ENCOUNTER — Ambulatory Visit: Payer: 59 | Admitting: Thoracic Surgery (Cardiothoracic Vascular Surgery)

## 2019-11-27 ENCOUNTER — Ambulatory Visit (HOSPITAL_COMMUNITY): Payer: 59 | Attending: Cardiology

## 2019-11-27 ENCOUNTER — Other Ambulatory Visit: Payer: Self-pay

## 2019-11-27 DIAGNOSIS — Z9889 Other specified postprocedural states: Secondary | ICD-10-CM | POA: Insufficient documentation

## 2019-12-05 ENCOUNTER — Ambulatory Visit: Payer: Self-pay | Admitting: Surgery

## 2019-12-09 ENCOUNTER — Ambulatory Visit: Payer: 59 | Admitting: Thoracic Surgery (Cardiothoracic Vascular Surgery)

## 2019-12-23 ENCOUNTER — Other Ambulatory Visit: Payer: Self-pay

## 2019-12-23 ENCOUNTER — Ambulatory Visit (INDEPENDENT_AMBULATORY_CARE_PROVIDER_SITE_OTHER): Payer: 59 | Admitting: Thoracic Surgery (Cardiothoracic Vascular Surgery)

## 2019-12-23 VITALS — BP 121/84 | HR 96 | Temp 98.5°F | Resp 20 | Ht 63.0 in | Wt 192.8 lb

## 2019-12-23 DIAGNOSIS — Z9889 Other specified postprocedural states: Secondary | ICD-10-CM | POA: Diagnosis not present

## 2019-12-23 NOTE — Progress Notes (Signed)
[address]       [place] [address]             [phone]     CARDIOTHORACIC SURGERY OFFICE NOTE  Primary Cardiologist is [name], MD PCP is [name], MD   HPI:  Patient is 44 year old obese African-American female returns the office today for routine follow-up approximately 1 year status post minimally invasive mitral valve repair on Nov 28, 2018 for stage C2 severe asymptomatic primary mitral regurgitation.  Her postoperative recovery was uneventful.  She has been followed carefully ever since by Dr. Percival Spanish.  Initial follow-up echocardiogram performed early after surgery revealed intact mitral valve repair with ejection fraction estimated 40 to 45%.  She recently underwent repeat follow-up echocardiogram on Nov 27, 2019 which revealed intact mitral valve repair with ejection fraction estimated 50 to 55%.  There was trivial residual mitral regurgitation and mean transvalvular gradient was estimated 4 mmHg.  Patient states that she is doing very well.  Swelling is down and less exertional shortness of breath since she was started on losartan and dose of torsemide was increased.  She does state that she occasionally has mild dizzy spells, particularly when she stands up after having been seated for a prolonged period of time.  The symptoms are quite mild and overall she states that she is getting along very well.  She states that she feels "somewhat improved" in comparison with how she felt prior to surgery.  She states that she only gets short of breath with more strenuous physical exertion and she is only slightly limited by exertional symptoms.  Overall she is quite pleased with how she has done and how much better she feels than she did prior to surgery, despite the fact that prior to surgery she really was not complaining of a whole lot of symptoms.   Current Outpatient Medications  Medication Sig Dispense Refill  . aspirin EC 81 MG tablet Take 81  mg by mouth daily.    Marland Kitchen losartan (COZAAR) 50 MG tablet Take 1 tablet (50 mg total) by mouth daily. 90 tablet 3  . metoprolol succinate (TOPROL-XL) 100 MG 24 hr tablet Take 1 tablet (100 mg total) by mouth daily. Take with or immediately following a meal. 90 tablet 3  . tazarotene (AVAGE) 0.1 % cream Apply 1 application topically at bedtime.   2  . torsemide (DEMADEX) 10 MG tablet Take 1 tablet (10 mg total) by mouth 2 (two) times daily. 180 tablet 3  . Ubrogepant (UBRELVY) 100 MG TABS Take 1 tablet by mouth as needed. 30 tablet 1   No current facility-administered medications for this visit.      Physical Exam:   LMP 01/12/2012   General:  Well-appearing  Chest:   Clear to auscultation  CV:   Regular rate and rhythm without murmur  Incisions:  Completely healed  Abdomen:  Soft nontender  Extremities:  Warm and well-perfused  Diagnostic Tests:   ECHOCARDIOGRAM REPORT       Patient Name:  [name] Date of Exam: 11/27/2019  Medical Rec #: [id number]       Height:    63.0 in  Accession #:  [id number]      Weight:    200.0 lb  Date of Birth: 1976-02-28       BSA:     1.934 m  Patient Age:  27 years       BP:  102/80 mmHg  Patient Gender: F           HR:      69 bpm.  Exam Location: [address]   Procedure: 2D Echo, Cardiac Doppler and Color Doppler   Indications:  Z98.890    History:    Patient has prior history of Echocardiogram examinations,  most         recent 01/11/2019. H/o Mitral repair; Risk  Factors:Hypertension         and Dyslipidemia.    Sonographer:  Coralyn Helling RDCS  Referring Phys: [place]    1. Left ventricular ejection fraction, by estimation, is 50 to 55%. The  left ventricle has low normal function. The left ventricle has no regional  wall motion abnormalities. There is mild left ventricular hypertrophy.    Left ventricular diastolic  parameters are indeterminate.  2. Right ventricular systolic function is normal. The right ventricular  size is normal. There is normal pulmonary artery systolic pressure. The  estimated right ventricular systolic pressure is 85.8 mmHg.  3. The mitral valve has been repaired/replaced. There is a prosthetic  annuloplasty ring present in the mitral position. Trivial mitral valve  regurgitation. The mean mitral valve gradient is 4 mmHg.  4. The aortic valve is tricuspid. Aortic valve regurgitation is not  visualized. No aortic stenosis is present.  5. The inferior vena cava is normal in size with greater than 50%  respiratory variability, suggesting right atrial pressure of 3 mmHg.   FINDINGS  Left Ventricle: Left ventricular ejection fraction, by estimation, is 50  to 55%. The left ventricle has low normal function. The left ventricle has  no regional wall motion abnormalities. The left ventricular internal  cavity size was normal in size.  There is mild left ventricular hypertrophy. Left ventricular diastolic  parameters are indeterminate.   Right Ventricle: The right ventricular size is normal. Right vetricular  wall thickness was not assessed. Right ventricular systolic function is  normal. There is normal pulmonary artery systolic pressure. The tricuspid  regurgitant velocity is 2.01 m/s,  and with an assumed right atrial pressure of 3 mmHg, the estimated right  ventricular systolic pressure is 85.0 mmHg.   Left Atrium: Left atrial size was normal in size.   Right Atrium: Right atrial size was normal in size.   Pericardium: There is no evidence of pericardial effusion.   Mitral Valve: The mitral valve has been repaired/replaced. Trivial mitral  valve regurgitation. There is a prosthetic annuloplasty ring present in  the mitral position. The mean mitral valve gradient is 4.0 mmHg.   Tricuspid Valve: The tricuspid valve is normal in structure.  Tricuspid  valve regurgitation is trivial.   Aortic Valve: The aortic valve is tricuspid. Aortic valve regurgitation is  not visualized. No aortic stenosis is present.   Pulmonic Valve: The pulmonic valve was grossly normal. Pulmonic valve  regurgitation is trivial.   Aorta: The aortic root and ascending aorta are structurally normal, with  no evidence of dilitation.   Venous: The inferior vena cava is normal in size with greater than 50%  respiratory variability, suggesting right atrial pressure of 3 mmHg.   IAS/Shunts: The interatrial septum was not well visualized.     LEFT VENTRICLE  PLAX 2D  LVIDd:     4.20 cm Diastology  LVIDs:     3.00 cm LV e' lateral:  4.46 cm/s  LV PW:     1.00 cm LV E/e' lateral: 33.6  LV IVS:    1.10 cm LV e' medial:  4.79 cm/s  LVOT diam:   2.00 cm LV E/e' medial: 31.3  LV SV:     55  LV SV Index:  29  LVOT Area:   3.14 cm     RIGHT VENTRICLE       IVC  RV S prime:   11.70 cm/s IVC diam: 0.80 cm  TAPSE (M-mode): 2.0 cm  RVSP:      19.2 mmHg   LEFT ATRIUM       Index    RIGHT ATRIUM      Index  LA diam:    2.70 cm 1.40 cm/m RA Pressure: 3.00 mmHg  LA Vol (A2C):  39.3 ml 20.32 ml/m RA Area:   14.80 cm  LA Vol (A4C):  49.1 ml 25.39 ml/m RA Volume:  43.50 ml 22.50 ml/m  LA Biplane Vol: 44.6 ml 23.07 ml/m  AORTIC VALVE  LVOT Vmax:  94.20 cm/s  LVOT Vmean: 66.600 cm/s  LVOT VTI:  0.176 m    AORTA  Ao Root diam: 2.80 cm  Ao Asc diam: 3.00 cm   MITRAL VALVE        TRICUSPID VALVE  MV Area (PHT):       TR Peak grad:  16.2 mmHg  MV Mean grad: 4.0 mmHg   TR Vmax:    201.00 cm/s               Estimated RAP: 3.00 mmHg  MV E velocity: 150.00 cm/s RVSP:      19.2 mmHg  MV A velocity: 142.00 cm/s  MV E/A ratio: 1.06     SHUNTS               Systemic VTI: 0.18 m                Systemic Diam: 2.00 cm   Oswaldo Milian MD  Electronically signed by Oswaldo Milian MD  Signature Date/Time: 11/27/2019/12:22:01 PM      Impression:  Patient is doing well approximately 1 year status post mitral valve repair  Plan:  We have not recommended any changes to the patient's current medications.  Patient will continue to follow-up regularly with Dr. Percival Spanish and return to our office in the future only should specific problems or questions arise.  The patient has been reminded regarding the importance of dental hygiene and the lifelong need for antibiotic prophylaxis for all dental cleanings and other related invasive procedures.   I spent in excess of 15 minutes during the conduct of this office consultation and >50% of this time involved direct face-to-face encounter with the patient for counseling and/or coordination of their care.    Valentina Gu. Roxy Manns, MD 12/23/2019 3:58 PM

## 2019-12-23 NOTE — Patient Instructions (Signed)

## 2020-01-07 NOTE — Patient Instructions (Addendum)
DUE TO COVID-19 ONLY ONE VISITOR IS ALLOWED TO COME WITH YOU AND STAY IN THE WAITING ROOM ONLY DURING PRE OP AND PROCEDURE DAY OF SURGERY. THE 1 VISITOR MAY VISIT WITH YOU AFTER SURGERY IN YOUR PRIVATE ROOM DURING VISITING HOURS ONLY!  YOU NEED TO HAVE A COVID 19 TEST ON_7/12______ @_2 :50______, THIS TEST MUST BE DONE BEFORE SURGERY, COME  [place],  [place] , [address].  ([place]) ONCE YOUR COVID TEST IS COMPLETED, PLEASE BEGIN THE QUARANTINE INSTRUCTIONS AS OUTLINED IN YOUR HANDOUT.                [name]   Your procedure is scheduled on: 01/16/20   Report to [facility] Main  Entrance   Report to admitting at  6:30 AM     Call this number if you have problems the morning of surgery [phone]    Remember: Do not eat food or drink liquids :After Midnight.   BRUSH YOUR TEETH MORNING OF SURGERY AND RINSE YOUR MOUTH OUT, NO CHEWING GUM CANDY OR MINTS.     Take these medicines the morning of surgery with A SIP OF WATER: Metoprolol                                 You may not have any metal on your body including hair pins and              piercings  Do not wear jewelry, make-up, lotions, powders or perfumes, deodorant             Do not wear nail polish on your fingernails.  Do not shave  48 hours prior to surgery.             .   Do not bring valuables to the hospital. [place].  Contacts, dentures or bridgework may not be worn into surgery.       Patients discharged the day of surgery will not be allowed to drive home.   IF YOU ARE HAVING SURGERY AND GOING HOME THE SAME DAY, YOU MUST HAVE AN ADULT TO DRIVE YOU HOME AND BE WITH YOU FOR 24 HOURS.   YOU MAY GO HOME BY TAXI OR UBER OR ORTHERWISE, BUT AN ADULT MUST ACCOMPANY YOU HOME AND STAY WITH YOU FOR 24 HOURS.  Name and phone number of your driver:  Special Instructions: N/A              Please read over the following  fact sheets you were given: _____________________________________________________________________             [facility] - Preparing for Surgery Before surgery, you can play an important role.   Because skin is not sterile, your skin needs to be as free of germs as possible.   You can reduce the number of germs on your skin by washing with CHG (chlorahexidine gluconate) soap before surgery.   CHG is an antiseptic cleaner which kills germs and bonds with the skin to continue killing germs even after washing. Please DO NOT use if you have an allergy to CHG or antibacterial soaps.   If your skin becomes reddened/irritated stop using the CHG and inform your nurse when you arrive at Short Stay. Do not shave (including legs and underarms) for at least 48  hours prior to the first CHG shower.    Please follow these instructions carefully:  1.  Shower with CHG Soap the night before surgery and the  morning of Surgery.  2.  If you choose to wash your hair, wash your hair first as usual with your  normal  shampoo.  3.  After you shampoo, rinse your hair and body thoroughly to remove the  shampoo.                                        4.  Use CHG as you would any other liquid soap.  You can apply chg directly  to the skin and wash                       Gently with a scrungie or clean washcloth.  5.  Apply the CHG Soap to your body ONLY FROM THE NECK DOWN.   Do not use on face/ open                           Wound or open sores. Avoid contact with eyes, ears mouth and genitals (private parts).                       Wash face,  Genitals (private parts) with your normal soap.             6.  Wash thoroughly, paying special attention to the area where your surgery  will be performed.  7.  Thoroughly rinse your body with warm water from the neck down.  8.  DO NOT shower/wash with your normal soap after using and rinsing off  the CHG Soap.             9.  Pat yourself dry with a clean towel.             10.  Wear clean pajamas.            11.  Place clean sheets on your bed the night of your first shower and do not  sleep with pets. Day of Surgery : Do not apply any lotions/deodorants the morning of surgery.  Please wear clean clothes to the hospital/surgery center.  FAILURE TO FOLLOW THESE INSTRUCTIONS MAY RESULT IN THE CANCELLATION OF YOUR SURGERY PATIENT SIGNATURE_________________________________  NURSE SIGNATURE__________________________________  ________________________________________________________________________

## 2020-01-08 ENCOUNTER — Encounter: Payer: Self-pay | Admitting: Internal Medicine

## 2020-01-08 ENCOUNTER — Encounter (HOSPITAL_COMMUNITY): Payer: Self-pay

## 2020-01-08 ENCOUNTER — Other Ambulatory Visit: Payer: Self-pay

## 2020-01-08 ENCOUNTER — Encounter (HOSPITAL_COMMUNITY)
Admission: RE | Admit: 2020-01-08 | Discharge: 2020-01-08 | Disposition: A | Discharge status: Home / Self Care | Payer: 59 | Source: Ambulatory Visit | Attending: Surgery | Admitting: Surgery

## 2020-01-08 DIAGNOSIS — Z01812 Encounter for preprocedural laboratory examination: Secondary | ICD-10-CM | POA: Insufficient documentation

## 2020-01-08 HISTORY — DX: Angina pectoris, unspecified: I20.9

## 2020-01-08 HISTORY — DX: Cardiac arrhythmia, unspecified: I49.9

## 2020-01-08 LAB — CBC
HCT: 43.5 % (ref 36.0–46.0)
Hemoglobin: 14.3 g/dL (ref 12.0–15.0)
MCH: 29.8 pg (ref 26.0–34.0)
MCHC: 32.9 g/dL (ref 30.0–36.0)
MCV: 90.6 fL (ref 80.0–100.0)
Platelets: 344 10*3/uL (ref 150–400)
RBC: 4.8 MIL/uL (ref 3.87–5.11)
RDW: 14.2 % (ref 11.5–15.5)
WBC: 7.4 10*3/uL (ref 4.0–10.5)
nRBC: 0 % (ref 0.0–0.2)

## 2020-01-08 LAB — BASIC METABOLIC PANEL
Anion gap: 12 (ref 5–15)
BUN: 9 mg/dL (ref 6–20)
CO2: 30 mmol/L (ref 22–32)
Calcium: 9 mg/dL (ref 8.9–10.3)
Chloride: 98 mmol/L (ref 98–111)
Creatinine, Ser: 1.08 mg/dL — ABNORMAL HIGH (ref 0.44–1.00)
GFR calc Af Amer: >60 mL/min (ref >60)
GFR calc non Af Amer: >60 mL/min (ref >60)
Glucose, Bld: 122 mg/dL — ABNORMAL HIGH (ref 70–99)
Potassium: 3.4 mmol/L — ABNORMAL LOW (ref 3.5–5.1)
Sodium: 140 mmol/L (ref 135–145)

## 2020-01-08 LAB — SURGICAL PCR SCREEN
MRSA, PCR: NEGATIVE
Staphylococcus aureus: NEGATIVE

## 2020-01-08 NOTE — Progress Notes (Signed)
COVID Vaccine Completed:yes Date COVID Vaccine completed:10/16/19 COVID vaccine manufacturer: Pfizer    [facility]  PCP - Dr. Alona Bene Cardiologist - D. J. Hochrein  Chest x-ray - 07/17/19 EKG - 07/16/19 Stress Test - no ECHO - 11/27/19 Cardiac Cath - no  Mitral valve repair done 5/27/ 2020  Sleep Study - no CPAP -   Fasting Blood Sugar - NA Checks Blood Sugar _____ times a day  Blood Thinner Instructions:ASA Aspirin Instructions:Stop 5 days prior to DOS Last Dose:01/01/20  Anesthesia review:   Patient denies shortness of breath, fever, cough and chest pain at PAT appointment  yes   Patient verbalized understanding of instructions that were given to them at the PAT appointment. Patient was also instructed that they will need to review over the PAT instructions again at home before surgery.  Yes  Pt is in good condition and has no SOB with ADLs.
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
## 2020-01-12 IMAGING — CR PORTABLE CHEST - 1 VIEW
1 series · 1 of 1 positions shown · non-contrast
Comparison: November 30, 2018

CLINICAL DATA: Pleural effusion.

EXAM:
PORTABLE CHEST 1 VIEW

[AP]
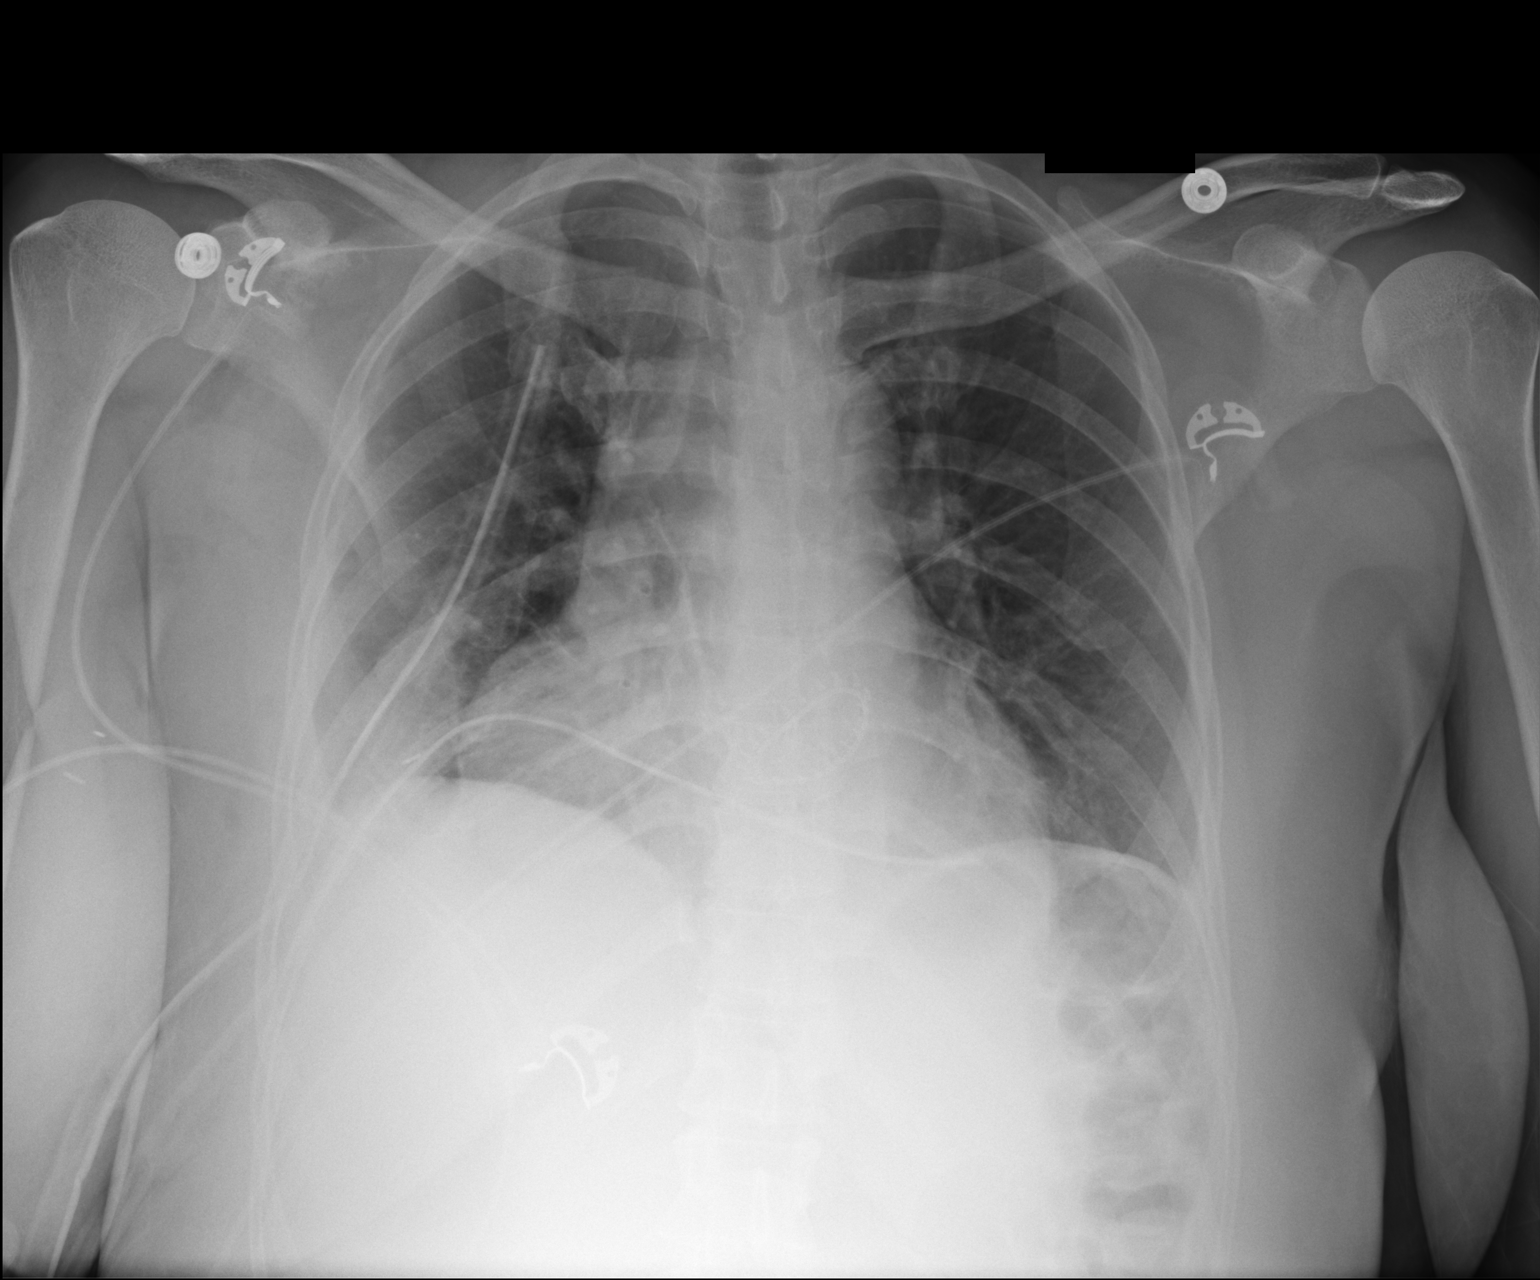

[1 of 1 positions shown; findings below may reference images not displayed]

FINDINGS: Right-sided chest tubes remain, stable. No right-sided pneumothorax.
Probable atelectasis and/or small effusion adjacent to the
right-sided chest tube is in the right base. These findings are
stable. The left lung remains clear. The cardiomediastinal
silhouette is stable.
IMPRESSION: No acute interval change. Stable right chest tubes. Possible tiny
right pleural effusion.

## 2020-01-13 ENCOUNTER — Other Ambulatory Visit (HOSPITAL_COMMUNITY)
Admission: RE | Admit: 2020-01-13 | Discharge: 2020-01-13 | Disposition: A | Discharge status: Home / Self Care | Payer: 59 | Source: Ambulatory Visit | Attending: Surgery | Admitting: Surgery

## 2020-01-13 ENCOUNTER — Encounter (HOSPITAL_COMMUNITY): Payer: Self-pay | Admitting: Surgery

## 2020-01-13 DIAGNOSIS — Z20822 Contact with and (suspected) exposure to covid-19: Secondary | ICD-10-CM | POA: Insufficient documentation

## 2020-01-13 DIAGNOSIS — K828 Other specified diseases of gallbladder: Secondary | ICD-10-CM | POA: Diagnosis present

## 2020-01-13 DIAGNOSIS — Z01812 Encounter for preprocedural laboratory examination: Secondary | ICD-10-CM | POA: Insufficient documentation

## 2020-01-13 LAB — SARS CORONAVIRUS 2 (TAT 6-24 HRS): SARS Coronavirus 2: NEGATIVE

## 2020-01-13 NOTE — Anesthesia Preprocedure Evaluation (Addendum)
Anesthesia Evaluation  Patient identified by MRN, date of birth, ID band Patient awake    Reviewed: Allergy & Precautions, NPO status , Patient's Chart, lab work & pertinent test results, reviewed documented beta blocker date and time   History of Anesthesia Complications Negative for: history of anesthetic complications  Airway Mallampati: II  TM Distance: >3 FB Neck ROM: Full    Dental  (+) Dental Advisory Given, Teeth Intact   Pulmonary former smoker,    breath sounds clear to auscultation       Cardiovascular hypertension, Pt. on medications and Pt. on home beta blockers +CHF  + Valvular Problems/Murmurs  Rhythm:Regular  Conclusion: Exercise testing with gas exchange demonstrates a  mild functional limitation when compared to matched sedentary  norms. At peak exercise the patient appears to be limited by a  mild circulatory impairment as well as deconditioning.   . Left ventricular ejection fraction, by estimation, is 50 to 55%. The  left ventricle has low normal function. The left ventricle has no regional  wall motion abnormalities. There is mild left ventricular hypertrophy.  Left ventricular diastolic  parameters are indeterminate.  2. Right ventricular systolic function is normal. The right ventricular  size is normal. There is normal pulmonary artery systolic pressure. The  estimated right ventricular systolic pressure is 55.9 mmHg.  3. The mitral valve has been repaired/replaced. There is a prosthetic  annuloplasty ring present in the mitral position. Trivial mitral valve  regurgitation. The mean mitral valve gradient is 4 mmHg.  4. The aortic valve is tricuspid. Aortic valve regurgitation is not  visualized. No aortic stenosis is present.  5. The inferior vena cava is normal in size with greater than 50%  respiratory variability, suggesting right atrial pressure of 3 mmHg.    Neuro/Psych  Headaches, negative  psych ROS   GI/Hepatic Neg liver ROS, GERD  Controlled,  Endo/Other    Renal/GU negative Renal ROS     Musculoskeletal   Abdominal   Peds  Hematology  (+) Blood dyscrasia, anemia ,   Anesthesia Other Findings   Reproductive/Obstetrics                                                            Anesthesia Evaluation  Patient identified by MRN, date of birth, ID band Patient awake    Reviewed: Allergy & Precautions, NPO status , Patient's Chart, lab work & pertinent test results  History of Anesthesia Complications Negative for: history of anesthetic complications  Airway Mallampati: II  TM Distance: >3 FB Neck ROM: Full    Dental  (+) Teeth Intact   Pulmonary neg shortness of breath, neg sleep apnea, neg COPD, neg recent URI, former smoker (quit 2012),    breath sounds clear to auscultation       Cardiovascular hypertension, Pt. on medications and Pt. on home beta blockers (-) CAD + Valvular Problems/Murmurs MR  Rhythm:Regular  08/2018 ECHO: EF 52%, mild bileaflet mitral prolapse with mod MR   Neuro/Psych  Headaches, negative psych ROS   GI/Hepatic Neg liver ROS, GERD  ,  Endo/Other  Hypothyroidism   Renal/GU negative Renal ROS     Musculoskeletal   Abdominal   Peds  Hematology negative hematology ROS (+)   Anesthesia Other Findings   Reproductive/Obstetrics  Anesthesia Physical Anesthesia Plan  ASA: IV  Anesthesia Plan: General   Post-op Pain Management:    Induction: Intravenous  PONV Risk Score and Plan: 3 and Treatment may vary due to age or medical condition  Airway Management Planned: Oral ETT  Additional Equipment: Arterial line, PA Cath, CVP, TEE and Ultrasound Guidance Line Placement  Intra-op Plan:   Post-operative Plan: Post-operative intubation/ventilation  Informed Consent: I have reviewed the patients History and Physical, chart, labs and  discussed the procedure including the risks, benefits and alternatives for the proposed anesthesia with the patient or authorized representative who has indicated his/her understanding and acceptance.       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  LV EF LOWER LIMITS OF NORMAL    Compared with prior Echo study on 08/02/2017: NO SIGNIFICANT CHANGES)       Anesthesia Quick Evaluation  Anesthesia Physical Anesthesia Plan  ASA: III  Anesthesia Plan: General   Post-op Pain Management:    Induction: Intravenous  PONV Risk Score and Plan: 3 and Ondansetron and Dexamethasone  Airway Management Planned: Oral ETT  Additional Equipment: None  Intra-op Plan:   Post-operative Plan: Extubation in OR  Informed Consent: I have reviewed the patients History and Physical, chart, labs and discussed the procedure including the risks, benefits and alternatives for the proposed anesthesia with the patient or authorized representative who has indicated his/her understanding and acceptance.     Dental advisory given  Plan Discussed with: CRNA and Surgeon  Anesthesia Plan Comments: (See PAT note 01/08/2020, Konrad Felix, PA-C)       Anesthesia Quick Evaluation

## 2020-01-13 NOTE — H&P (Signed)
General Surgery [facility]  [name] DOB: 1976-03-10 Married / Language: Undefined / Race: Refused to Report/Unreported Female   History of Present Illness  The patient is a 44 year old female who presents for evaluation of gall stones.  CHIEF COMPLAINT: biliary dyskinesia, abdominal pain  Patient is referred by Dr. Lucio Edward and Alonza Bogus, PA-C, for surgical evaluation and management of suspected biliary dyskinesia. Patient developed symptoms approximately 2 months ago. She initially had right flank pain which eventually migrated to the right upper quadrant of the abdomen. Pain was intermittent. She did have episodes of nausea and 2 episodes of emesis. She noted the pain occurred approximately 30 minutes after meals. Patient presented for evaluation. She underwent an ultrasound of the abdomen on Nov 12, 2019. This showed no significant abnormality. Patient subsequently underwent a nuclear medicine hepatobiliary scan which showed a gallbladder ejection fraction of less than 25% consistent with biliary dyskinesia. Patient was referred to general surgery for consideration for cholecystectomy. Patient denies any prior history of hepatobiliary or pancreatic disease. She denies any jaundice or acholic stools. She does have a family history of a sister who require cholecystectomy. Previous abdominal surgery includes a laparoscopic hysterectomy in 2013. Patient works as a Education officer, museum in [place].   Past Surgical History  Knee Surgery  Right.  Diagnostic Studies History Colonoscopy  within last year Pap Smear  1-5 years ago  Allergies No Known Drug Allergies  Medication History Clindamycin Phos-Benzoyl Perox (1.2-5% Gel, External) Active. Metoprolol Succinate ER (100MG  Tablet ER 24HR, Oral) Active. Torsemide (10MG  Tablet, Oral two times daily) Active. Aspirin (81MG  Tablet, Oral) Active. Medications Reconciled  Social  History  Alcohol use  Occasional alcohol use. Caffeine use  Carbonated beverages. Tobacco use  Former smoker.  Family History  Alcohol Abuse  Family Members In General. Breast Cancer  Family Members In General. Cervical Cancer  Family Members In General. Thyroid problems  Mother.  Other Problems  [name] Pain  Congestive Heart Failure  Gastroesophageal Reflux Disease  Heart murmur  Hemorrhoids  Migraine Headache  Ulcerative Colitis   Review of Systems  General Present- Fatigue. Not Present- Appetite Loss, Chills, Fever, Night Sweats, Weight Gain and Weight Loss. HEENT Present- Seasonal Allergies and Wears glasses/contact lenses. Not Present- Earache, Hearing Loss, Hoarseness, Nose Bleed, Oral Ulcers, Ringing in the Ears, Sinus Pain, Sore Throat, Visual Disturbances and Yellow Eyes. Respiratory Not Present- Bloody sputum, Chronic Cough, Difficulty Breathing, Snoring and Wheezing. Gastrointestinal Present- Abdominal Pain, Change in Bowel Habits and Gets full quickly at meals. Not Present- Bloating, Bloody Stool, Chronic diarrhea, Constipation, Difficulty Swallowing, Excessive gas, Hemorrhoids, Indigestion, Nausea, Rectal Pain and Vomiting. Female Genitourinary Not Present- Frequency, Nocturia, Painful Urination, Pelvic Pain and Urgency. Neurological Not Present- Decreased Memory, Fainting, Headaches, Numbness, Seizures, Tingling, Tremor, Trouble walking and Weakness. Psychiatric Not Present- Anxiety, Bipolar, Change in Sleep Pattern, Depression, Fearful and Frequent crying. Endocrine Not Present- Cold Intolerance, Excessive Hunger, Hair Changes, Heat Intolerance, Hot flashes and New Diabetes. Hematology Not Present- Blood Thinners, Easy Bruising, Excessive bleeding, Gland problems, HIV and Persistent Infections.  Vitals  Weight: 191.6 lb Height: 63in Body Surface Area: 1.9 m Body Mass Index: 33.94 kg/m  BP: 120/76(Sitting, Left Arm, Standard)  Physical Exam    GENERAL APPEARANCE Development: normal Nutritional status: normal Gross deformities: none  SKIN Rash, lesions, ulcers: none Induration, erythema: none Nodules: none palpable  EYES Conjunctiva and lids: normal Pupils: equal and reactive Iris: normal bilaterally  EARS, NOSE, MOUTH, THROAT External  ears: no lesion or deformity External nose: no lesion or deformity Hearing: grossly normal Due to Covid-19 pandemic, patient is wearing a mask.  NECK Symmetric: yes Trachea: midline Thyroid: no palpable nodules in the thyroid bed  CHEST Respiratory effort: normal Retraction or accessory muscle use: no Breath sounds: normal bilaterally Rales, rhonchi, wheeze: none  CARDIOVASCULAR Auscultation: regular rhythm, normal rate Murmurs: none Pulses: radial pulse 2+ palpable Lower extremity edema: none  ABDOMEN Distension: none Masses: none palpable Tenderness: none Hepatosplenomegaly: not present Hernia: not present  MUSCULOSKELETAL Station and gait: normal Digits and nails: no clubbing or cyanosis Muscle strength: grossly normal all extremities Range of motion: grossly normal all extremities Deformity: none  LYMPHATIC Cervical: none palpable Supraclavicular: none palpable  PSYCHIATRIC Oriented to person, place, and time: yes Mood and affect: normal for situation Judgment and insight: appropriate for situation    Assessment & Plan   BILIARY 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 Patient is provided with written literature on laparoscopic gallbladder surgery to review at home.  Patient has a two-month history of intermittent right upper quadrant abdominal pain associated with nausea and emesis. Diagnostic studies included an abdominal ultrasound which was unremarkable and a nuclear  medicine hepatobiliary scan which was positive for biliary dyskinesia. I have recommended proceeding with laparoscopic cholecystectomy with intraoperative cholangiography. We've discussed the risk and benefits of the procedure. We've discussed the location of the surgical incisions. We have discussed performing intraoperative cholangiography. We discussed the fact that approximately 75% of people have improvement in their symptoms following this procedure. We discussed the hospital stay to be anticipated and her postoperative recovery and return to work and activities. Patient understands and wishes to proceed with surgery in the near future.  The risks and benefits of the procedure have been discussed at length with the patient. The patient understands the proposed procedure, potential alternative treatments, and the course of recovery to be expected. All of the patient's questions have been answered at this time. The patient wishes to proceed with surgery.  Armandina Gemma, MD [facility] Office: [phone]

## 2020-01-14 ENCOUNTER — Ambulatory Visit: Payer: 59 | Admitting: Internal Medicine

## 2020-01-16 ENCOUNTER — Encounter (HOSPITAL_COMMUNITY)
Admission: RE | Disposition: A | Discharge status: Home / Self Care | Payer: Self-pay | Source: Ambulatory Visit | Attending: Surgery

## 2020-01-16 ENCOUNTER — Other Ambulatory Visit: Payer: Self-pay

## 2020-01-16 ENCOUNTER — Encounter (HOSPITAL_COMMUNITY): Payer: Self-pay | Admitting: Surgery

## 2020-01-16 ENCOUNTER — Ambulatory Visit (HOSPITAL_COMMUNITY): Payer: 59 | Admitting: Physician Assistant

## 2020-01-16 ENCOUNTER — Ambulatory Visit (HOSPITAL_COMMUNITY): Payer: 59 | Admitting: Certified Registered Nurse Anesthetist

## 2020-01-16 ENCOUNTER — Ambulatory Visit (HOSPITAL_COMMUNITY)
Admission: RE | Admit: 2020-01-16 | Discharge: 2020-01-16 | Disposition: A | Discharge status: Home / Self Care | Payer: 59 | Source: Ambulatory Visit | Attending: Surgery | Admitting: Surgery

## 2020-01-16 ENCOUNTER — Ambulatory Visit (HOSPITAL_COMMUNITY): Payer: 59

## 2020-01-16 DIAGNOSIS — I509 Heart failure, unspecified: Secondary | ICD-10-CM | POA: Diagnosis not present

## 2020-01-16 DIAGNOSIS — Z7982 Long term (current) use of aspirin: Secondary | ICD-10-CM | POA: Insufficient documentation

## 2020-01-16 DIAGNOSIS — Z87891 Personal history of nicotine dependence: Secondary | ICD-10-CM | POA: Insufficient documentation

## 2020-01-16 DIAGNOSIS — I110 Hypertensive heart disease with heart failure: Secondary | ICD-10-CM | POA: Insufficient documentation

## 2020-01-16 DIAGNOSIS — Z8379 Family history of other diseases of the digestive system: Secondary | ICD-10-CM | POA: Diagnosis not present

## 2020-01-16 DIAGNOSIS — Z79899 Other long term (current) drug therapy: Secondary | ICD-10-CM | POA: Insufficient documentation

## 2020-01-16 DIAGNOSIS — R1011 Right upper quadrant pain: Secondary | ICD-10-CM | POA: Diagnosis not present

## 2020-01-16 DIAGNOSIS — K828 Other specified diseases of gallbladder: Secondary | ICD-10-CM | POA: Diagnosis present

## 2020-01-16 HISTORY — PX: CHOLECYSTECTOMY: SHX55

## 2020-01-16 SURGERY — LAPAROSCOPIC CHOLECYSTECTOMY WITH INTRAOPERATIVE CHOLANGIOGRAM
Anesthesia: General | Site: Abdomen

## 2020-01-16 MED ORDER — OXYCODONE HCL 5 MG PO TABS
5.0000 mg | ORAL_TABLET | Freq: Once | ORAL | Status: AC | PRN
  Administered 2020-01-16: 5 mg via ORAL

## 2020-01-16 MED ORDER — LACTATED RINGERS IV SOLN: INTRAVENOUS | Status: DC

## 2020-01-16 MED ORDER — 0.9 % SODIUM CHLORIDE (POUR BTL) OPTIME
TOPICAL | Status: DC | PRN
  Administered 2020-01-16: 1000 mL

## 2020-01-16 MED ORDER — PROPOFOL 10 MG/ML IV BOLUS
INTRAVENOUS | Status: DC | PRN
  Administered 2020-01-16: 150 mg via INTRAVENOUS

## 2020-01-16 MED ORDER — PROPOFOL 10 MG/ML IV BOLUS
INTRAVENOUS | Status: AC
  Filled 2020-01-16: qty 20

## 2020-01-16 MED ORDER — ONDANSETRON HCL 4 MG/2ML IJ SOLN
INTRAMUSCULAR | Status: AC
  Filled 2020-01-16: qty 2

## 2020-01-16 MED ORDER — FENTANYL CITRATE (PF) 250 MCG/5ML IJ SOLN
INTRAMUSCULAR | Status: AC
  Filled 2020-01-16: qty 5

## 2020-01-16 MED ORDER — MIDAZOLAM HCL 2 MG/2ML IJ SOLN
INTRAMUSCULAR | Status: AC
  Filled 2020-01-16: qty 2

## 2020-01-16 MED ORDER — ONDANSETRON HCL 4 MG/2ML IJ SOLN: 4.0000 mg | Freq: Once | INTRAMUSCULAR | Status: DC | PRN

## 2020-01-16 MED ORDER — OXYCODONE HCL 5 MG PO TABS
ORAL_TABLET | ORAL | Status: AC
  Filled 2020-01-16: qty 1

## 2020-01-16 MED ORDER — ONDANSETRON HCL 4 MG/2ML IJ SOLN
INTRAMUSCULAR | Status: DC | PRN
  Administered 2020-01-16: 4 mg via INTRAVENOUS

## 2020-01-16 MED ORDER — ROCURONIUM BROMIDE 10 MG/ML (PF) SYRINGE
PREFILLED_SYRINGE | INTRAVENOUS | Status: AC
  Filled 2020-01-16: qty 10

## 2020-01-16 MED ORDER — CHLORHEXIDINE GLUCONATE CLOTH 2 % EX PADS: 6.0000 | MEDICATED_PAD | Freq: Once | CUTANEOUS | Status: DC

## 2020-01-16 MED ORDER — CHLORHEXIDINE GLUCONATE 0.12 % MT SOLN
15.0000 mL | Freq: Once | OROMUCOSAL | Status: AC
  Administered 2020-01-16: 15 mL via OROMUCOSAL

## 2020-01-16 MED ORDER — IOHEXOL 300 MG/ML  SOLN
INTRAMUSCULAR | Status: DC | PRN
  Administered 2020-01-16: 8 mL

## 2020-01-16 MED ORDER — FENTANYL CITRATE (PF) 250 MCG/5ML IJ SOLN
INTRAMUSCULAR | Status: DC | PRN
  Administered 2020-01-16 (×3): 50 ug via INTRAVENOUS
  Administered 2020-01-16: 100 ug via INTRAVENOUS

## 2020-01-16 MED ORDER — FENTANYL CITRATE (PF) 100 MCG/2ML IJ SOLN
25.0000 ug | INTRAMUSCULAR | Status: DC | PRN
  Administered 2020-01-16 (×2): 50 ug via INTRAVENOUS

## 2020-01-16 MED ORDER — MIDAZOLAM HCL 5 MG/5ML IJ SOLN
INTRAMUSCULAR | Status: DC | PRN
  Administered 2020-01-16: 2 mg via INTRAVENOUS

## 2020-01-16 MED ORDER — BUPIVACAINE-EPINEPHRINE (PF) 0.5% -1:200000 IJ SOLN
INTRAMUSCULAR | Status: AC
  Filled 2020-01-16: qty 30

## 2020-01-16 MED ORDER — ORAL CARE MOUTH RINSE: 15.0000 mL | Freq: Once | OROMUCOSAL | Status: AC

## 2020-01-16 MED ORDER — FENTANYL CITRATE (PF) 100 MCG/2ML IJ SOLN
INTRAMUSCULAR | Status: AC
  Filled 2020-01-16: qty 2

## 2020-01-16 MED ORDER — ROCURONIUM BROMIDE 10 MG/ML (PF) SYRINGE
PREFILLED_SYRINGE | INTRAVENOUS | Status: DC | PRN
  Administered 2020-01-16: 60 mg via INTRAVENOUS

## 2020-01-16 MED ORDER — LACTATED RINGERS IV SOLN: INTRAVENOUS | Status: DC | PRN

## 2020-01-16 MED ORDER — LIDOCAINE 2% (20 MG/ML) 5 ML SYRINGE
INTRAMUSCULAR | Status: AC
  Filled 2020-01-16: qty 5

## 2020-01-16 MED ORDER — DEXAMETHASONE SODIUM PHOSPHATE 10 MG/ML IJ SOLN
INTRAMUSCULAR | Status: DC | PRN
  Administered 2020-01-16: 10 mg via INTRAVENOUS

## 2020-01-16 MED ORDER — PHENYLEPHRINE HCL (PRESSORS) 10 MG/ML IV SOLN
INTRAVENOUS | Status: AC
  Filled 2020-01-16: qty 1

## 2020-01-16 MED ORDER — TRAMADOL HCL 50 MG PO TABS: 50.0000 mg | ORAL_TABLET | Freq: Four times a day (QID) | ORAL | 0 refills | Status: DC | PRN

## 2020-01-16 MED ORDER — KETOROLAC TROMETHAMINE 30 MG/ML IJ SOLN
INTRAMUSCULAR | Status: AC
  Filled 2020-01-16: qty 1

## 2020-01-16 MED ORDER — LIDOCAINE 2% (20 MG/ML) 5 ML SYRINGE
INTRAMUSCULAR | Status: DC | PRN
  Administered 2020-01-16: 100 mg via INTRAVENOUS

## 2020-01-16 MED ORDER — DEXAMETHASONE SODIUM PHOSPHATE 10 MG/ML IJ SOLN
INTRAMUSCULAR | Status: AC
  Filled 2020-01-16: qty 1

## 2020-01-16 MED ORDER — CEFAZOLIN SODIUM-DEXTROSE 2-4 GM/100ML-% IV SOLN
2.0000 g | INTRAVENOUS | Status: AC
  Administered 2020-01-16: 2 g via INTRAVENOUS
  Filled 2020-01-16: qty 100

## 2020-01-16 MED ORDER — ACETAMINOPHEN 325 MG PO TABS: 325.0000 mg | ORAL_TABLET | ORAL | Status: DC | PRN

## 2020-01-16 MED ORDER — MEPERIDINE HCL 50 MG/ML IJ SOLN: 6.2500 mg | INTRAMUSCULAR | Status: DC | PRN

## 2020-01-16 MED ORDER — OXYCODONE HCL 5 MG/5ML PO SOLN: 5.0000 mg | Freq: Once | ORAL | Status: AC | PRN

## 2020-01-16 MED ORDER — SUGAMMADEX SODIUM 200 MG/2ML IV SOLN
INTRAVENOUS | Status: DC | PRN
  Administered 2020-01-16: 300 mg via INTRAVENOUS

## 2020-01-16 MED ORDER — BUPIVACAINE-EPINEPHRINE 0.5% -1:200000 IJ SOLN
INTRAMUSCULAR | Status: DC | PRN
  Administered 2020-01-16: 30 mL

## 2020-01-16 MED ORDER — LACTATED RINGERS IR SOLN
Status: DC | PRN
  Administered 2020-01-16: 1000 mL

## 2020-01-16 MED ORDER — KETOROLAC TROMETHAMINE 30 MG/ML IJ SOLN
30.0000 mg | Freq: Once | INTRAMUSCULAR | Status: AC | PRN
  Administered 2020-01-16: 30 mg via INTRAVENOUS

## 2020-01-16 MED ORDER — ACETAMINOPHEN 160 MG/5ML PO SOLN: 325.0000 mg | ORAL | Status: DC | PRN

## 2020-01-16 SURGICAL SUPPLY — 35 items
APPLIER CLIP ROT 10 11.4 M/L (STAPLE) ×3
CABLE HIGH FREQUENCY MONO STRZ (ELECTRODE) ×3 IMPLANT
CHLORAPREP W/TINT 26 (MISCELLANEOUS) ×6 IMPLANT
CLIP APPLIE ROT 10 11.4 M/L (STAPLE) ×1 IMPLANT
CLOSURE WOUND 1/2 X4 (GAUZE/BANDAGES/DRESSINGS)
COVER MAYO STAND STRL (DRAPES) ×3 IMPLANT
COVER SURGICAL LIGHT HANDLE (MISCELLANEOUS) ×3 IMPLANT
COVER WAND RF STERILE (DRAPES) IMPLANT
DECANTER SPIKE VIAL GLASS SM (MISCELLANEOUS) ×3 IMPLANT
DERMABOND ADVANCED (GAUZE/BANDAGES/DRESSINGS) ×2
DERMABOND ADVANCED .7 DNX12 (GAUZE/BANDAGES/DRESSINGS) ×1 IMPLANT
DRAPE C-ARM 42X120 X-RAY (DRAPES) ×3 IMPLANT
ELECT REM PT RETURN 15FT ADLT (MISCELLANEOUS) ×3 IMPLANT
GAUZE SPONGE 2X2 8PLY STRL LF (GAUZE/BANDAGES/DRESSINGS) ×1 IMPLANT
GLOVE SURG ORTHO 8.0 STRL STRW (GLOVE) ×3 IMPLANT
GOWN STRL REUS W/TWL XL LVL3 (GOWN DISPOSABLE) ×6 IMPLANT
HEMOSTAT SURGICEL 4X8 (HEMOSTASIS) IMPLANT
KIT BASIN OR (CUSTOM PROCEDURE TRAY) ×3 IMPLANT
KIT TURNOVER KIT A (KITS) IMPLANT
PENCIL SMOKE EVACUATOR (MISCELLANEOUS) IMPLANT
POUCH SPECIMEN RETRIEVAL 10MM (ENDOMECHANICALS) ×3 IMPLANT
SCISSORS LAP 5X35 DISP (ENDOMECHANICALS) ×3 IMPLANT
SET CHOLANGIOGRAPH MIX (MISCELLANEOUS) ×3 IMPLANT
SET IRRIG TUBING LAPAROSCOPIC (IRRIGATION / IRRIGATOR) ×3 IMPLANT
SET TUBE SMOKE EVAC HIGH FLOW (TUBING) IMPLANT
SLEEVE XCEL OPT CAN 5 100 (ENDOMECHANICALS) ×3 IMPLANT
SPONGE GAUZE 2X2 STER 10/PKG (GAUZE/BANDAGES/DRESSINGS) ×2
STRIP CLOSURE SKIN 1/2X4 (GAUZE/BANDAGES/DRESSINGS) IMPLANT
SUT MNCRL AB 4-0 PS2 18 (SUTURE) ×3 IMPLANT
TOWEL OR 17X26 10 PK STRL BLUE (TOWEL DISPOSABLE) ×3 IMPLANT
TOWEL OR NON WOVEN STRL DISP B (DISPOSABLE) ×3 IMPLANT
TRAY LAPAROSCOPIC (CUSTOM PROCEDURE TRAY) ×3 IMPLANT
TROCAR BLADELESS OPT 5 100 (ENDOMECHANICALS) ×3 IMPLANT
TROCAR XCEL BLUNT TIP 100MML (ENDOMECHANICALS) ×3 IMPLANT
TROCAR XCEL NON-BLD 11X100MML (ENDOMECHANICALS) ×3 IMPLANT

## 2020-01-16 NOTE — Interval H&P Note (Signed)
History and Physical Interval Note:  01/16/2020 8:11 AM  [name]  has presented today for surgery, with the diagnosis of BILIARY DYSKINESIA.  The various methods of treatment have been discussed with the patient and family. After consideration of risks, benefits and other options for treatment, the patient has consented to    Procedure(s): LAPAROSCOPIC CHOLECYSTECTOMY WITH INTRAOPERATIVE CHOLANGIOGRAM (N/A) as a surgical intervention.    The patient's history has been reviewed, patient examined, no change in status, stable for surgery.  I have reviewed the patient's chart and labs.  Questions were answered to the patient's satisfaction.    Armandina Gemma, MD [facility] Office: [place]

## 2020-01-16 NOTE — Anesthesia Postprocedure Evaluation (Signed)
Anesthesia Post Note  Patient: [name]  Procedure(s) Performed: LAPAROSCOPIC CHOLECYSTECTOMY WITH INTRAOPERATIVE CHOLANGIOGRAM (N/A Abdomen)     Patient location during evaluation: PACU Anesthesia Type: General Level of consciousness: awake and alert Pain management: pain level controlled Vital Signs Assessment: post-procedure vital signs reviewed and stable Respiratory status: spontaneous breathing, nonlabored ventilation, respiratory function stable and patient connected to nasal cannula oxygen Cardiovascular status: blood pressure returned to baseline and stable Postop Assessment: no apparent nausea or vomiting Anesthetic complications: no   No complications documented.  Last Vitals:  Vitals:   01/16/20 1039 01/16/20 1215  BP: 130/86 110/87  Pulse: 78 78  Resp: 20 18  Temp: 36.4 C 36.5 C  SpO2: 96% 98%    Last Pain:  Vitals:   01/16/20 1215  TempSrc:   PainSc: 3                  Regis Wiland

## 2020-01-16 NOTE — Transfer of Care (Signed)
Immediate Anesthesia Transfer of Care Note  Patient: [name]  Procedure(s) Performed: LAPAROSCOPIC CHOLECYSTECTOMY WITH INTRAOPERATIVE CHOLANGIOGRAM (N/A Abdomen)  Patient Location: PACU  Anesthesia Type:General  Level of Consciousness: awake, alert , oriented and patient cooperative  Airway & Oxygen Therapy: Patient Spontanous Breathing and Patient connected to face mask oxygen  Post-op Assessment: Report given to RN, Post -op Vital signs reviewed and stable and Patient moving all extremities  Post vital signs: Reviewed and stable  Last Vitals:  Vitals Value Taken Time  BP 136/69 01/16/20 0946  Temp    Pulse 77 01/16/20 0948  Resp 16 01/16/20 0948  SpO2 100 % 01/16/20 0948  Vitals shown include unvalidated device data.  Last Pain:  Vitals:   01/16/20 0658  TempSrc: Oral      Patients Stated Pain Goal: 4 (28/90/22 8406)  Complications: No complications documented.

## 2020-01-16 NOTE — Anesthesia Procedure Notes (Signed)
Procedure Name: Intubation Date/Time: 01/16/2020 8:35 AM Performed by: Mitzie Na, CRNA Pre-anesthesia Checklist: Patient identified, Emergency Drugs available, Suction available and Patient being monitored Patient Re-evaluated:Patient Re-evaluated prior to induction Oxygen Delivery Method: Circle system utilized Preoxygenation: Pre-oxygenation with 100% oxygen Induction Type: IV induction Ventilation: Mask ventilation without difficulty Laryngoscope Size: Mac and 3 Grade View: Grade I Tube type: Oral Tube size: 7.0 mm Number of attempts: 1 Airway Equipment and Method: Stylet and Oral airway Placement Confirmation: ETT inserted through vocal cords under direct vision,  positive ETCO2 and breath sounds checked- equal and bilateral Secured at: 23 cm Tube secured with: Tape Dental Injury: Teeth and Oropharynx as per pre-operative assessment

## 2020-01-16 NOTE — Op Note (Signed)
Procedure Note  Pre-operative Diagnosis:  Biliary dyskinesia, abdominal pain RUQ  Post-operative Diagnosis:  same  Surgeon:  Armandina Gemma, MD  Assistant:  none   Procedure:  Laparoscopic cholecystectomy with intra-operative cholangiography  Anesthesia:  General  Estimated Blood Loss:  minimal  Drains: none         Specimen: gallbladder to pathology  Indications:  Patient is referred by Dr. Lucio Edward and Alonza Bogus, PA-C, for surgical evaluation and management of suspected biliary dyskinesia. Patient developed symptoms approximately 2 months ago. She initially had right flank pain which eventually migrated to the right upper quadrant of the abdomen. Pain was intermittent. She did have episodes of nausea and 2 episodes of emesis. She noted the pain occurred approximately 30 minutes after meals. Patient presented for evaluation. She underwent an ultrasound of the abdomen on Nov 12, 2019. This showed no significant abnormality. Patient subsequently underwent a nuclear medicine hepatobiliary scan which showed a gallbladder ejection fraction of less than 25% consistent with biliary dyskinesia. Patient was referred to general surgery for consideration for cholecystectomy.   Procedure Details:  The patient was seen in the pre-op holding area. The risks, benefits, complications, treatment options, and expected outcomes were previously discussed with the patient. The patient agreed with the proposed plan and has signed the informed consent form.  The patient was transported to operating room # 1 at the [facility]. The patient was placed in the supine position on the operating room table. Following induction of general anesthesia, the abdomen was prepped and draped in the usual aseptic fashion.  An incision was made in the skin near the umbilicus. The midline fascia was incised and the peritoneal cavity was entered and a Hasson cannula was introduced under direct vision. The  cannula was secured with a 0-Vicryl pursestring suture. Pneumoperitoneum was established with carbon dioxide. Additional cannulae were introduced under direct vision along the right costal margin in the midline, mid-clavicular line, and anterior axillary line.   The gallbladder was identified and the fundus grasped and retracted cephalad. Adhesions were taken down bluntly and the electrocautery was utilized as needed, taking care not to involve any adjacent structures. The infundibulum was grasped and retracted laterally, exposing the peritoneum overlying the triangle of Calot. The peritoneum was incised and structures exposed with blunt dissection. The cystic duct was clearly identified, bluntly dissected circumferentially, and clipped at the neck of the gallbladder.  An incision was made in the cystic duct and the cholangiogram catheter introduced. The catheter was secured using an ligaclip.  Real-time cholangiography was performed using C-arm fluoroscopy.  There was rapid filling of a normal caliber common bile duct.  There was reflux of contrast into the left and right hepatic ductal systems.  There was free flow distally into the duodenum without filling defect or obstruction.  The catheter was removed from the peritoneal cavity.  The cystic duct was then ligated with ligaclips and divided. The cystic artery was identified, dissected circumferentially, ligated with ligaclips, and divided.  The gallbladder was dissected away from the gallbladder bed using the electrocautery for hemostasis. The gallbladder was completely removed from the liver and placed into an endocatch bag. The gallbladder was removed in the endocatch bag through the umbilical port site and submitted to pathology for review.  The right upper quadrant was irrigated and the gallbladder bed was inspected. Hemostasis was achieved with the electrocautery.  Cannulae were removed under direct vision and good hemostasis was noted.  Pneumoperitoneum was released and the majority  of the carbon dioxide evacuated. The umbilical wound was irrigated and the fascia was then closed with the pursestring suture.  Local anesthetic was infiltrated at all port sites. Skin incisions were closed with 4-0 Monocril subcuticular sutures and Dermabond was applied.  Instrument, sponge, and needle counts were correct at the conclusion of the case.  The patient was awakened from anesthesia and brought to the recovery room in stable condition.  The patient tolerated the procedure well.   Armandina Gemma, MD [facility] Office: [phone]

## 2020-01-17 ENCOUNTER — Encounter (HOSPITAL_COMMUNITY): Payer: Self-pay | Admitting: Surgery

## 2020-01-17 LAB — SURGICAL PATHOLOGY

## 2020-01-17 IMAGING — DX CHEST - 2 VIEW
2 series · 2 of 2 positions shown · non-contrast
Comparison: 12/05/2018

CLINICAL DATA: Follow-up right-sided pleural effusion

EXAM:
CHEST - 2 VIEW

[chest pa]
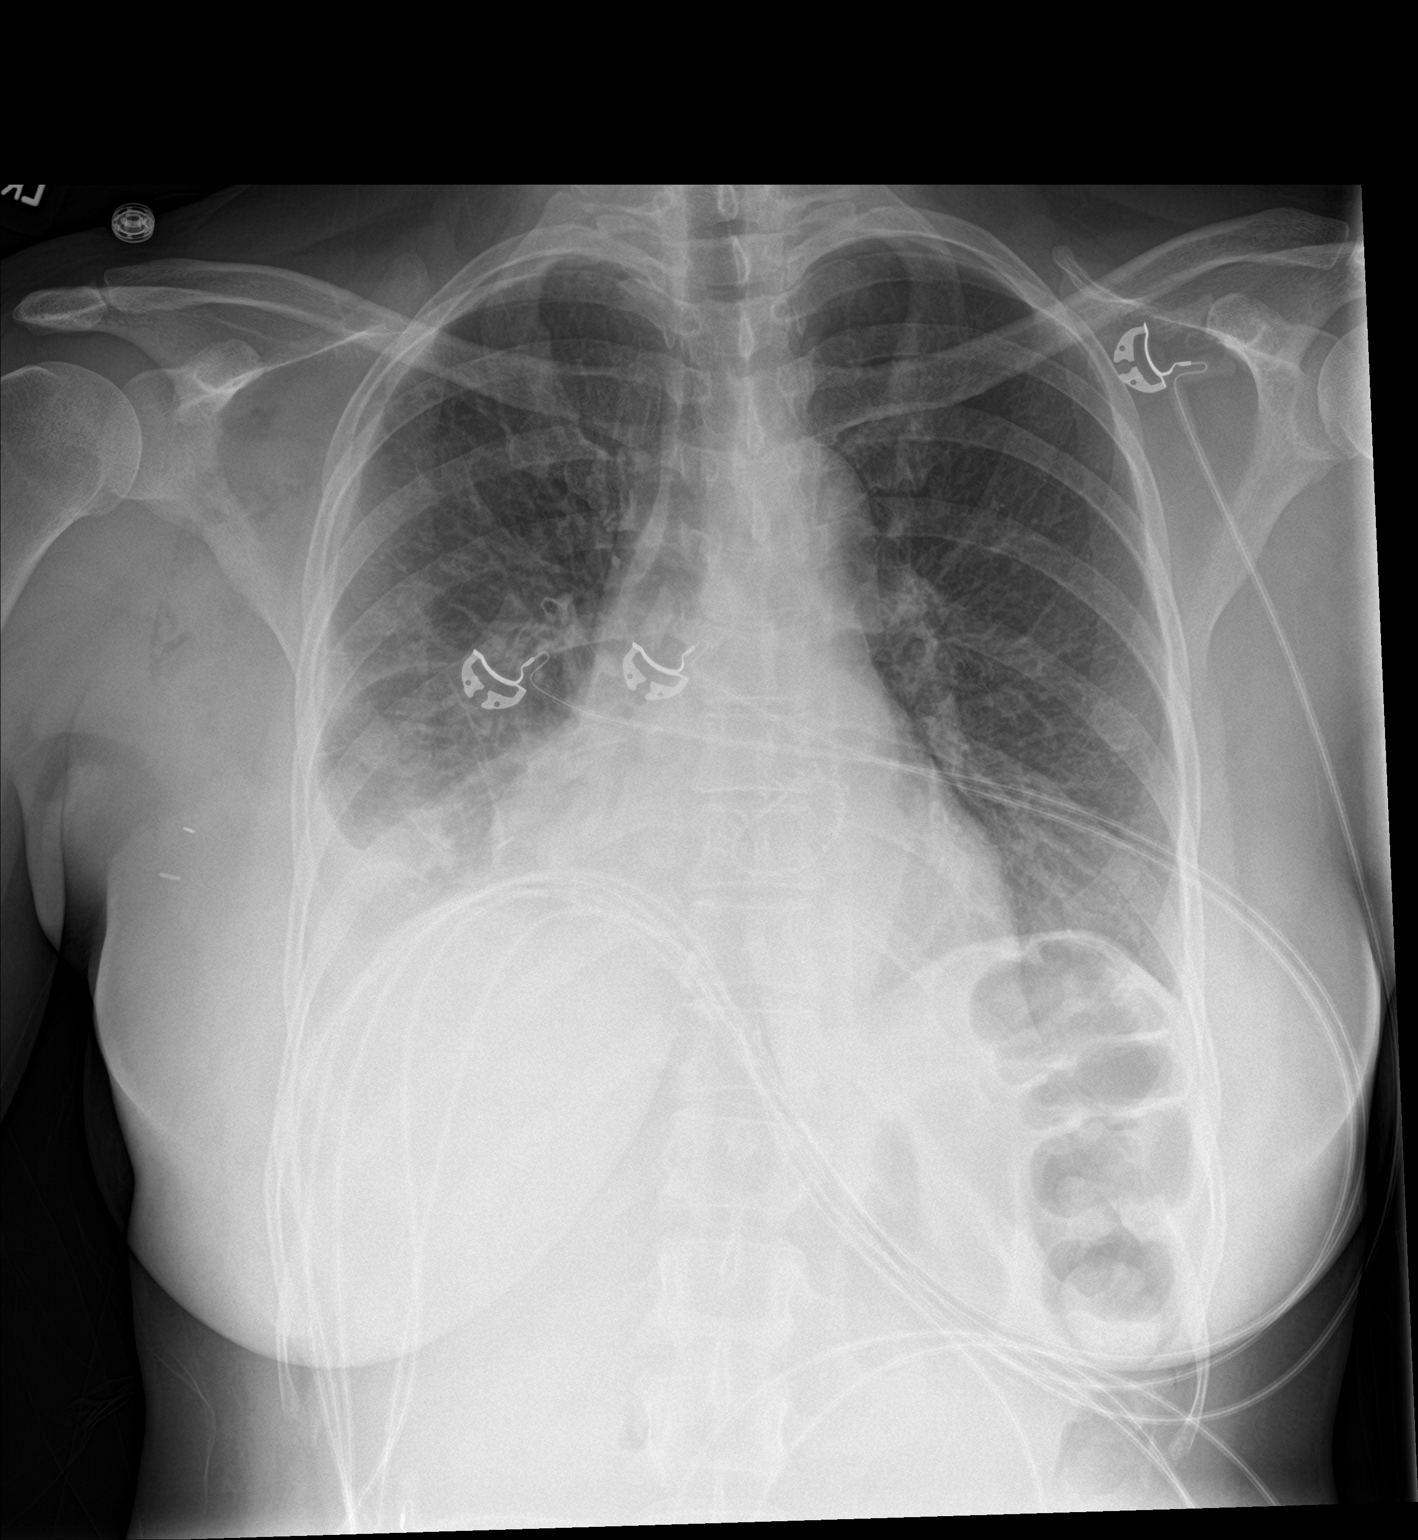

[chest lat]
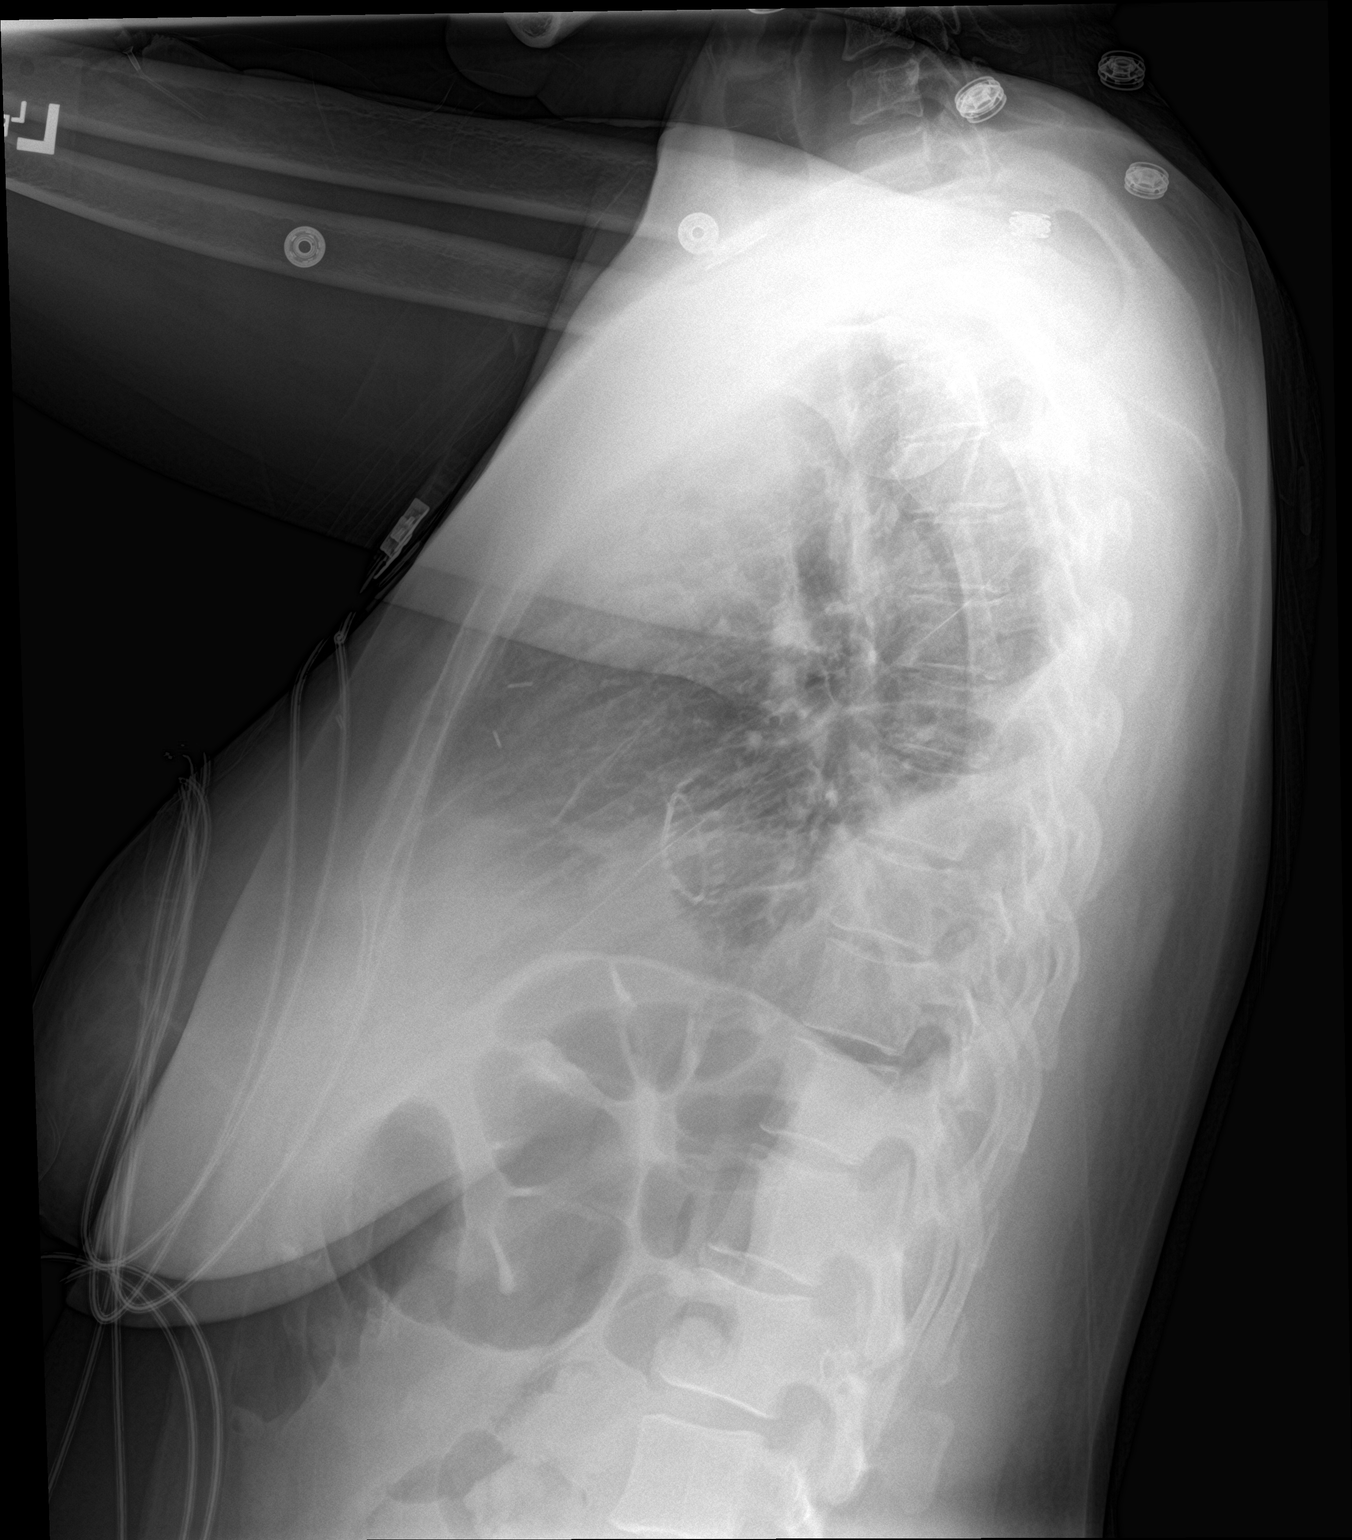

[2 of 2 positions shown; findings below may reference images not displayed]

FINDINGS: Cardiac shadow is stable. Postsurgical changes are again seen. Small
right pleural effusion is again identified and stable. Mild
atelectatic changes are seen. No pneumothorax is noted.
IMPRESSION: Small right pleural effusion and basilar atelectasis.

## 2020-01-23 ENCOUNTER — Ambulatory Visit: Payer: 59 | Admitting: Internal Medicine

## 2020-01-27 ENCOUNTER — Ambulatory Visit: Payer: 59 | Admitting: Internal Medicine

## 2020-01-27 DIAGNOSIS — Z0289 Encounter for other administrative examinations: Secondary | ICD-10-CM

## 2020-01-28 IMAGING — DX CHEST - 2 VIEW
2 series · 2 of 2 positions shown · non-contrast
Comparison: 12/06/2018

CLINICAL DATA: Postop from mitral valve repair approximately 2
weeks ago.

EXAM:
CHEST - 2 VIEW

[dg chest 2 view (1 of 2)]
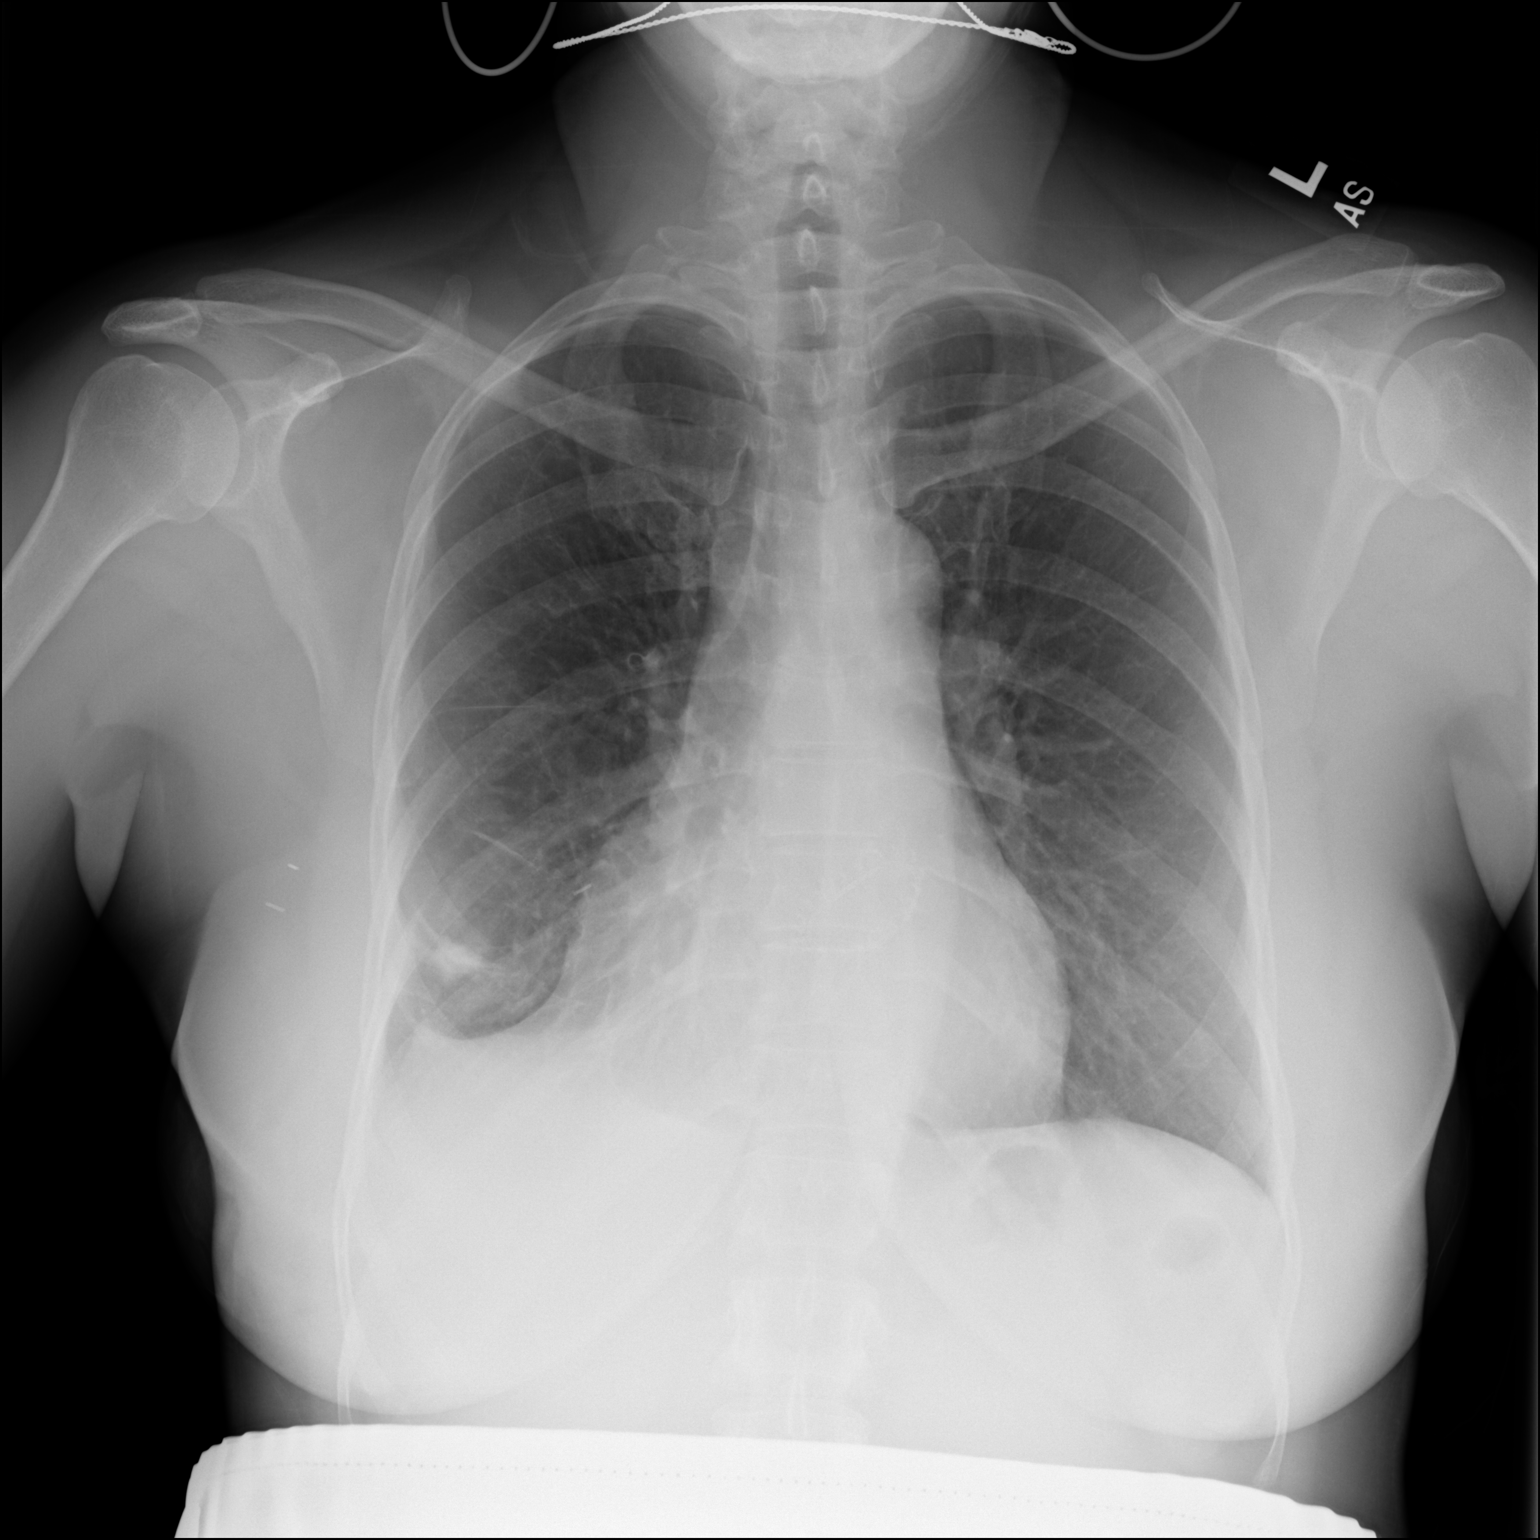

[dg chest 2 view (2 of 2)]
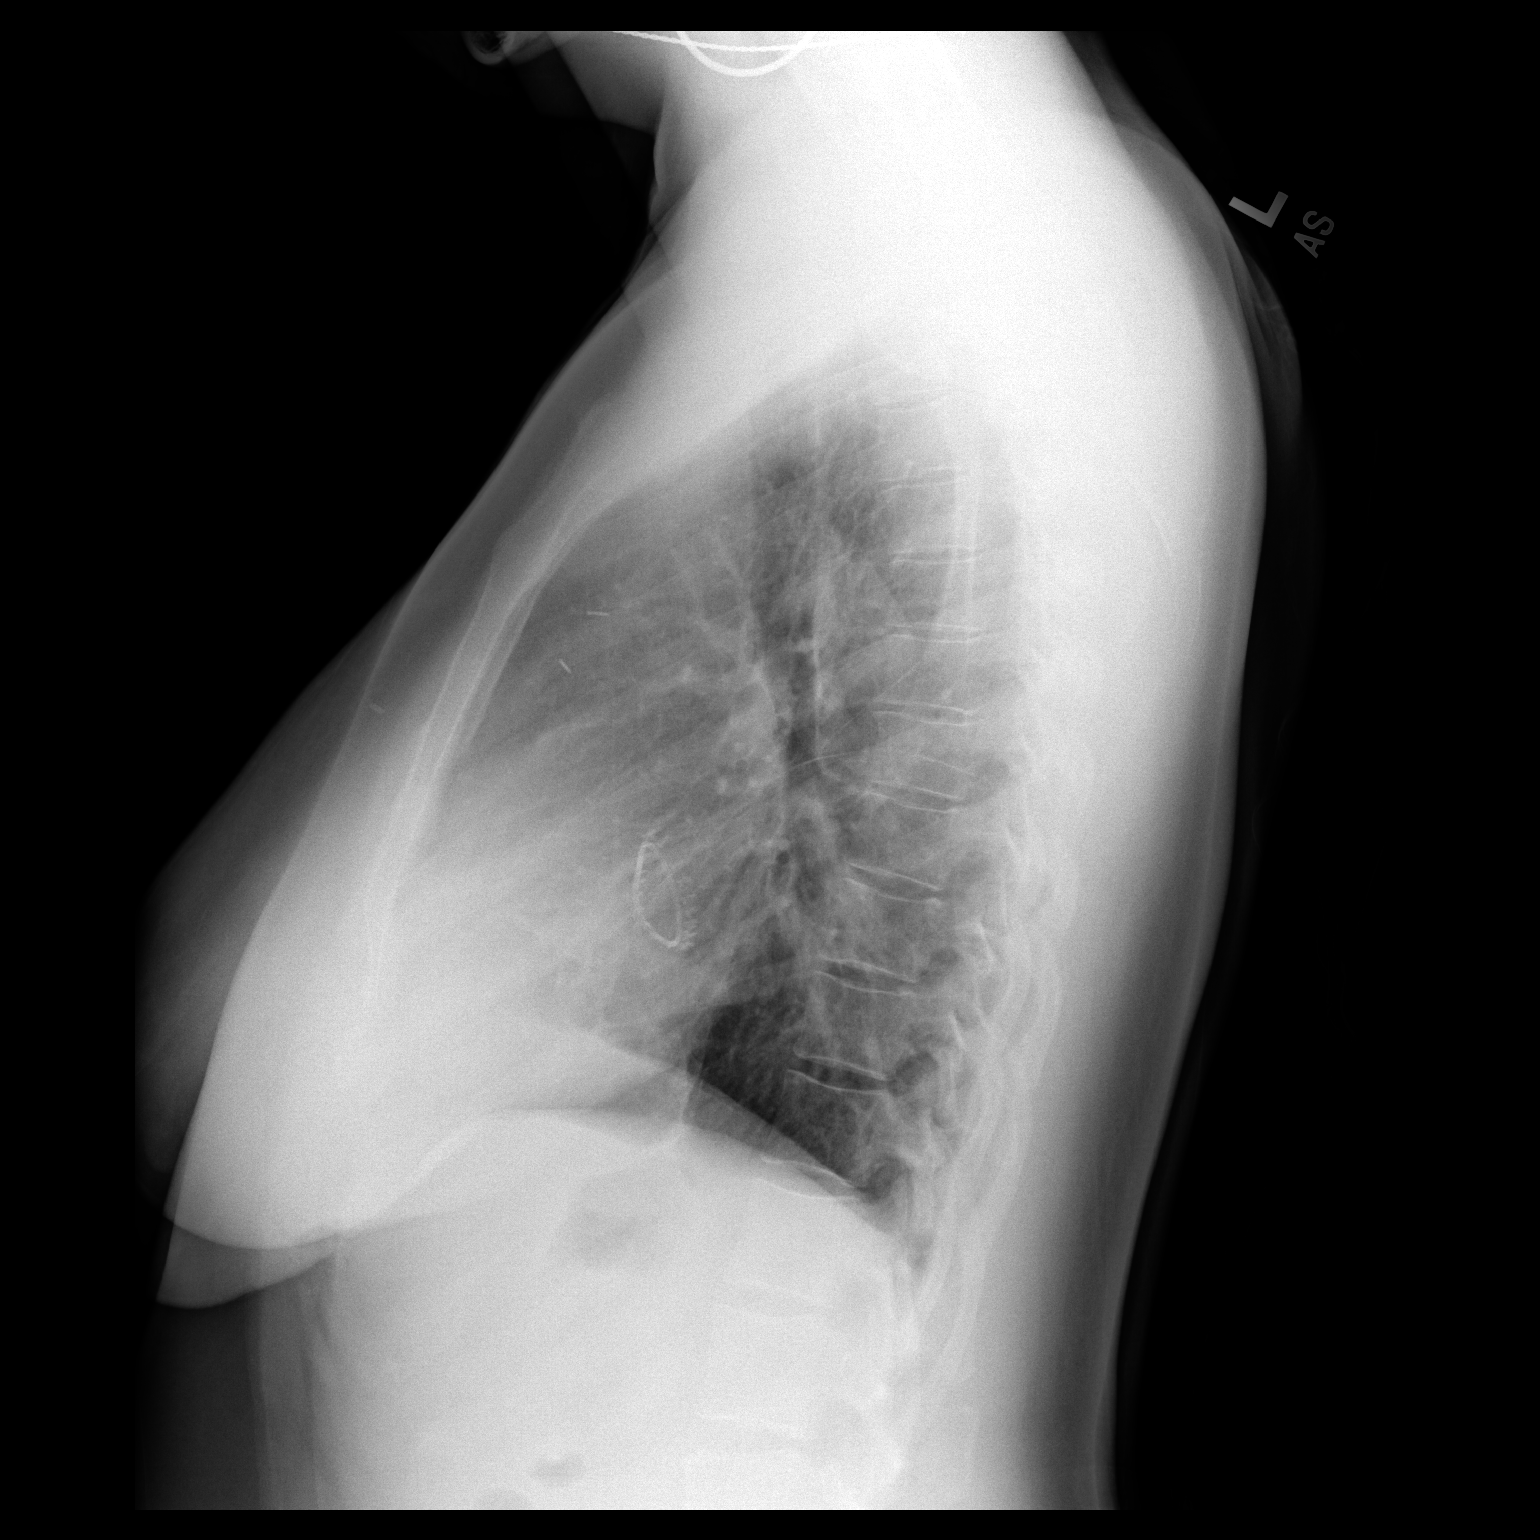

[2 of 2 positions shown; findings below may reference images not displayed]

FINDINGS: Heart size is within normal limits. Prosthetic mitral valve is
noted. Right pleural effusion is resolved since previous study.
There is mild residual pleural-parenchymal scarring and volume loss
in the right lung base. No evidence of pulmonary infiltrate or
edema.
IMPRESSION: Resolution of right pleural effusion, with residual right basilar
pleural-parenchymal scarring.

No active cardiopulmonary disease.

## 2020-01-29 ENCOUNTER — Ambulatory Visit: Payer: 59 | Admitting: Internal Medicine

## 2020-03-05 ENCOUNTER — Encounter: Payer: Self-pay | Admitting: Internal Medicine

## 2020-03-05 ENCOUNTER — Other Ambulatory Visit: Payer: Self-pay

## 2020-03-05 ENCOUNTER — Ambulatory Visit (INDEPENDENT_AMBULATORY_CARE_PROVIDER_SITE_OTHER): Payer: Managed Care, Other (non HMO) | Admitting: Internal Medicine

## 2020-03-05 VITALS — BP 118/76 | HR 82 | Temp 98.6°F | Resp 16 | Ht 63.0 in | Wt 191.0 lb

## 2020-03-05 DIAGNOSIS — E781 Pure hyperglyceridemia: Secondary | ICD-10-CM

## 2020-03-05 DIAGNOSIS — T502X5A Adverse effect of carbonic-anhydrase inhibitors, benzothiadiazides and other diuretics, initial encounter: Secondary | ICD-10-CM

## 2020-03-05 DIAGNOSIS — I10 Essential (primary) hypertension: Secondary | ICD-10-CM

## 2020-03-05 DIAGNOSIS — Z0000 Encounter for general adult medical examination without abnormal findings: Secondary | ICD-10-CM

## 2020-03-05 DIAGNOSIS — R739 Hyperglycemia, unspecified: Secondary | ICD-10-CM | POA: Diagnosis not present

## 2020-03-05 DIAGNOSIS — E038 Other specified hypothyroidism: Secondary | ICD-10-CM | POA: Diagnosis not present

## 2020-03-05 DIAGNOSIS — E876 Hypokalemia: Secondary | ICD-10-CM

## 2020-03-05 NOTE — Patient Instructions (Signed)
Health Maintenance, Female Adopting a healthy lifestyle and getting preventive care are important in promoting health and wellness. Ask your health care provider about:  The right schedule for you to have regular tests and exams.  Things you can do on your own to prevent diseases and keep yourself healthy. What should I know about diet, weight, and exercise? Eat a healthy diet   Eat a diet that includes plenty of vegetables, fruits, low-fat dairy products, and lean protein.  Do not eat a lot of foods that are high in solid fats, added sugars, or sodium. Maintain a healthy weight Body mass index (BMI) is used to identify weight problems. It estimates body fat based on height and weight. Your health care provider can help determine your BMI and help you achieve or maintain a healthy weight. Get regular exercise Get regular exercise. This is one of the most important things you can do for your health. Most adults should:  Exercise for at least 150 minutes each week. The exercise should increase your heart rate and make you sweat (moderate-intensity exercise).  Do strengthening exercises at least twice a week. This is in addition to the moderate-intensity exercise.  Spend less time sitting. Even light physical activity can be beneficial. Watch cholesterol and blood lipids Have your blood tested for lipids and cholesterol at 45 years of age, then have this test every 5 years. Have your cholesterol levels checked more often if:  Your lipid or cholesterol levels are high.  You are older than 45 years of age.  You are at high risk for heart disease. What should I know about cancer screening? Depending on your health history and family history, you may need to have cancer screening at various ages. This may include screening for:  Breast cancer.  Cervical cancer.  Colorectal cancer.  Skin cancer.  Lung cancer. What should I know about heart disease, diabetes, and high blood  pressure? Blood pressure and heart disease  High blood pressure causes heart disease and increases the risk of stroke. This is more likely to develop in people who have high blood pressure readings, are of African descent, or are overweight.  Have your blood pressure checked: ? Every 3-5 years if you are 18-39 years of age. ? Every year if you are 40 years old or older. Diabetes Have regular diabetes screenings. This checks your fasting blood sugar level. Have the screening done:  Once every three years after age 40 if you are at a normal weight and have a low risk for diabetes.  More often and at a younger age if you are overweight or have a high risk for diabetes. What should I know about preventing infection? Hepatitis B If you have a higher risk for hepatitis B, you should be screened for this virus. Talk with your health care provider to find out if you are at risk for hepatitis B infection. Hepatitis C Testing is recommended for:  Everyone born from 1945 through 1965.  Anyone with known risk factors for hepatitis C. Sexually transmitted infections (STIs)  Get screened for STIs, including gonorrhea and chlamydia, if: ? You are sexually active and are younger than 45 years of age. ? You are older than 45 years of age and your health care provider tells you that you are at risk for this type of infection. ? Your sexual activity has changed since you were last screened, and you are at increased risk for chlamydia or gonorrhea. Ask your health care provider if   you are at risk.  Ask your health care provider about whether you are at high risk for HIV. Your health care provider may recommend a prescription medicine to help prevent HIV infection. If you choose to take medicine to prevent HIV, you should first get tested for HIV. You should then be tested every 3 months for as long as you are taking the medicine. Pregnancy  If you are about to stop having your period (premenopausal) and  you may become pregnant, seek counseling before you get pregnant.  Take 400 to 800 micrograms (mcg) of folic acid every day if you become pregnant.  Ask for birth control (contraception) if you want to prevent pregnancy. Osteoporosis and menopause Osteoporosis is a disease in which the bones lose minerals and strength with aging. This can result in bone fractures. If you are 65 years old or older, or if you are at risk for osteoporosis and fractures, ask your health care provider if you should:  Be screened for bone loss.  Take a calcium or vitamin D supplement to lower your risk of fractures.  Be given hormone replacement therapy (HRT) to treat symptoms of menopause. Follow these instructions at home: Lifestyle  Do not use any products that contain nicotine or tobacco, such as cigarettes, e-cigarettes, and chewing tobacco. If you need help quitting, ask your health care provider.  Do not use street drugs.  Do not share needles.  Ask your health care provider for help if you need support or information about quitting drugs. Alcohol use  Do not drink alcohol if: ? Your health care provider tells you not to drink. ? You are pregnant, may be pregnant, or are planning to become pregnant.  If you drink alcohol: ? Limit how much you use to 0-1 drink a day. ? Limit intake if you are breastfeeding.  Be aware of how much alcohol is in your drink. In the [place] one drink equals one 12 oz bottle of beer (355 mL), one 5 oz glass of wine (148 mL), or one 1 oz glass of hard liquor (44 mL). General instructions  Schedule regular health, dental, and eye exams.  Stay current with your vaccines.  Tell your health care provider if: ? You often feel depressed. ? You have ever been abused or do not feel safe at home. Summary  Adopting a healthy lifestyle and getting preventive care are important in promoting health and wellness.  Follow your health care provider's instructions about healthy  diet, exercising, and getting tested or screened for diseases.  Follow your health care provider's instructions on monitoring your cholesterol and blood pressure. This information is not intended to replace advice given to you by your health care provider. Make sure you discuss any questions you have with your health care provider. Document Revised: 06/13/2018 Document Reviewed: 06/13/2018 Elsevier Patient Education  2020 [employer]vier Inc.  

## 2020-03-05 NOTE — Progress Notes (Signed)
Subjective:  Patient ID: [name], female    DOB: 08/23/1975  Age: 44 y.o. MRN: [id number]  CC: Annual Exam, Hyperlipidemia, Hypothyroidism, and Hypertension  This visit occurred during the SARS-CoV-2 public health emergency.  Safety protocols were in place, including screening questions prior to the visit, additional usage of staff PPE, and extensive cleaning of exam room while observing appropriate contact time as indicated for disinfecting solutions.    HPI [name] presents for a CPX.  She complains of weight gain but otherwise feels well and offers no other complaints. She is active and denies any recent episodes of chest pain, shortness of breath, palpitations, edema, or fatigue.  Outpatient Medications Prior to Visit  Medication Sig Dispense Refill  . aspirin EC 81 MG tablet Take 81 mg by mouth daily.    . Clindamycin-Benzoyl Per, Refr, gel Apply 1 application topically daily.    Marland Kitchen losartan (COZAAR) 50 MG tablet Take 1 tablet (50 mg total) by mouth daily. 90 tablet 3  . metoprolol succinate (TOPROL-XL) 100 MG 24 hr tablet Take 1 tablet (100 mg total) by mouth daily. Take with or immediately following a meal. 90 tablet 3  . tazarotene (AVAGE) 0.1 % cream Apply 1 application topically at bedtime.   2  . torsemide (DEMADEX) 10 MG tablet Take 1 tablet (10 mg total) by mouth 2 (two) times daily. (Patient taking differently: Take 20 mg by mouth daily. ) 180 tablet 3  . traMADol (ULTRAM) 50 MG tablet Take 1-2 tablets (50-100 mg total) by mouth every 6 (six) hours as needed. 15 tablet 0  . Ubrogepant (UBRELVY) 100 MG TABS Take 1 tablet by mouth as needed. (Patient taking differently: Take 1 tablet by mouth daily as needed (migraine.). ) 30 tablet 1   No facility-administered medications prior to visit.    ROS Review of Systems  Constitutional: Positive for unexpected weight change. Negative for appetite change, chills, diaphoresis and fatigue.  HENT: Negative.    Eyes: Negative for visual disturbance.  Respiratory: Negative for cough, chest tightness, shortness of breath and wheezing.   Cardiovascular: Negative for chest pain, palpitations and leg swelling.  Gastrointestinal: Negative for abdominal pain, constipation, diarrhea, nausea and vomiting.  Endocrine: Negative for cold intolerance and heat intolerance.  Genitourinary: Negative.  Negative for difficulty urinating.  Musculoskeletal: Negative.  Negative for arthralgias and myalgias.  Skin: Negative for color change.  Neurological: Negative.  Negative for dizziness, weakness, light-headedness and headaches.  Hematological: Negative for adenopathy. Does not bruise/bleed easily.  Psychiatric/Behavioral: Negative.     Objective:  BP 118/76   Pulse 82   Temp 98.6 F (37 C) (Oral)   Resp 16   Ht 5\' 3"  (1.6 m)   Wt 191 lb (86.6 kg)   LMP 01/12/2012   SpO2 98%   BMI 33.83 kg/m   BP Readings from Last 3 Encounters:  03/05/20 118/76  01/16/20 110/87  01/08/20 110/69    Wt Readings from Last 3 Encounters:  03/05/20 191 lb (86.6 kg)  01/16/20 188 lb (85.3 kg)  01/08/20 188 lb (85.3 kg)    Physical Exam Vitals reviewed.  HENT:     Nose: Nose normal.     Mouth/Throat:     Mouth: Mucous membranes are moist.  Eyes:     General: No scleral icterus.    Conjunctiva/sclera: Conjunctivae normal.  Cardiovascular:     Rate and Rhythm: Normal rate and regular rhythm.     Heart sounds: No murmur heard.  Pulmonary:     Effort: Pulmonary effort is normal.     Breath sounds: No stridor. No wheezing, rhonchi or rales.  Abdominal:     General: Abdomen is flat.     Palpations: There is no mass.     Tenderness: There is no abdominal tenderness.  Musculoskeletal:        General: Normal range of motion.     Cervical back: Neck supple.     Right lower leg: No edema.     Left lower leg: No edema.  Lymphadenopathy:     Cervical: No cervical adenopathy.  Skin:    General: Skin is warm  and dry.  Neurological:     General: No focal deficit present.     Mental Status: She is alert.  Psychiatric:        Mood and Affect: Mood normal.     Lab Results  Component Value Date   WBC 7.4 01/08/2020   HGB 14.3 01/08/2020   HCT 43.5 01/08/2020   PLT 344 01/08/2020   GLUCOSE 101 (H) 03/05/2020   CHOL 237 (H) 03/05/2020   TRIG 381 (H) 03/05/2020   HDL 49 (L) 03/05/2020   LDLCALC 134 (H) 03/05/2020   ALT 11 10/29/2019   AST 11 10/29/2019   NA 141 03/05/2020   K 3.7 03/05/2020   CL 97 (L) 03/05/2020   CREATININE 1.12 (H) 03/05/2020   BUN 12 03/05/2020   CO2 31 03/05/2020   TSH 3.07 03/05/2020   INR 2.3 02/05/2019   HGBA1C 5.5 03/05/2020    No results found.  Assessment & Plan:   Jaycelyn was seen today for annual exam, hyperlipidemia, hypothyroidism and hypertension.  Diagnoses and all orders for this visit:  Essential hypertension- Her blood pressure is well controlled. Electrolytes and renal function are normal. -     BASIC METABOLIC PANEL WITH GFR; Future -     BASIC METABOLIC PANEL WITH GFR  Other specified hypothyroidism- Her TSH is in the normal range. Thyroid replacement therapy is not indicated. -     TSH; Future -     TSH  Diuretic-induced hypokalemia- Her potassium level is normal now. -     BASIC METABOLIC PANEL WITH GFR; Future -     Magnesium; Future -     Magnesium -     BASIC METABOLIC PANEL WITH GFR  Routine general medical examination at a health care facility- Exam completed, labs reviewed, vaccines reviewed-she deferred on her flu vaccine, cancer screenings are up-to-date, patient education material was given. -     Lipid panel; Future -     Lipid panel  Hyperglycemia- Her A1c is normal. -     BASIC METABOLIC PANEL WITH GFR; Future -     Hemoglobin A1c; Future -     Hemoglobin A1c -     BASIC METABOLIC PANEL WITH GFR  Primary hypertriglyceridemia- In addition to lifestyle modifications I recommended she treat this with an omega-3  fish oil. -     omega-3 acid ethyl esters (LOVAZA) 1 g capsule; Take 2 capsules (2 g total) by mouth 2 (two) times daily.   I am having [name] D. Maricela Bo start on omega-3 acid ethyl esters. I am also having her maintain her tazarotene, torsemide, losartan, aspirin EC, metoprolol succinate, Ubrelvy, Clindamycin-Benzoyl Per (Refr), and traMADol.  Meds ordered this encounter  Medications  . omega-3 acid ethyl esters (LOVAZA) 1 g capsule    Sig: Take 2 capsules (2 g total) by mouth 2 (  two) times daily.    Dispense:  360 capsule    Refill:  1     Follow-up: Return in about 6 months (around 09/02/2020).  [name], MD

## 2020-03-06 ENCOUNTER — Encounter: Payer: Self-pay | Admitting: Internal Medicine

## 2020-03-06 DIAGNOSIS — E781 Pure hyperglyceridemia: Secondary | ICD-10-CM | POA: Insufficient documentation

## 2020-03-06 LAB — BASIC METABOLIC PANEL WITH GFR
BUN/Creatinine Ratio: 11 (calc) (ref 6–22)
BUN: 12 mg/dL (ref 7–25)
CO2: 31 mmol/L (ref 20–32)
Calcium: 10 mg/dL (ref 8.6–10.2)
Chloride: 97 mmol/L — ABNORMAL LOW (ref 98–110)
Creat: 1.12 mg/dL — ABNORMAL HIGH (ref 0.50–1.10)
GFR, Est African American: 69 mL/min/{1.73_m2} (ref >=60)
GFR, Est Non African American: 59 mL/min/{1.73_m2} — ABNORMAL LOW (ref >=60)
Glucose, Bld: 101 mg/dL — ABNORMAL HIGH (ref 65–99)
Potassium: 3.7 mmol/L (ref 3.5–5.3)
Sodium: 141 mmol/L (ref 135–146)

## 2020-03-06 LAB — LIPID PANEL
Cholesterol: 237 mg/dL — ABNORMAL HIGH (ref <200)
HDL: 49 mg/dL — ABNORMAL LOW (ref >=50)
LDL Cholesterol (Calc): 134 mg/dL (calc) — ABNORMAL HIGH
Non-HDL Cholesterol (Calc): 188 mg/dL (calc) — ABNORMAL HIGH (ref <130)
Total CHOL/HDL Ratio: 4.8 (calc) (ref <5.0)
Triglycerides: 381 mg/dL — ABNORMAL HIGH (ref <150)

## 2020-03-06 LAB — HEMOGLOBIN A1C
Hgb A1c MFr Bld: 5.5 % of total Hgb (ref <5.7)
Mean Plasma Glucose: 111 (calc)
eAG (mmol/L): 6.2 (calc)

## 2020-03-06 LAB — MAGNESIUM: Magnesium: 2 mg/dL (ref 1.5–2.5)

## 2020-03-06 LAB — TSH: TSH: 3.07 mIU/L

## 2020-03-06 MED ORDER — OMEGA-3-ACID ETHYL ESTERS 1 G PO CAPS: 2.0000 g | ORAL_CAPSULE | Freq: Two times a day (BID) | ORAL | 1 refills | Status: DC

## 2020-03-12 NOTE — Progress Notes (Deleted)
Cardiology Office Note   Date:  03/12/2020   ID:  DRUSCILLA [name], DOB 1976-05-04, MRN [id number]  PCP:  [name] Lima, MD  Cardiologist:   [name] Breeding, MD   No chief complaint on file.     History of Present Illness: [name] is a 44 y.o. female who presents for follow up of mitral valve prolapse and mitral regurgitation noted in 2013 with bileaflet prolapse.  The patient had a TEE with stress test in 2013 which revealed moderate regurgitation but did not worsen with exercise.  Follow-up echocardiogram in 2018 revealed her MR was again moderate.  She was being treated with beta-blocker.  She has also been treated at [facility] as she moved to [facility], echocardiogram there revealed that her EF was reduced to 52% and evidence of mild left ventricular systolic and diastolic enlargement compared to previous echoes and therefore it was recommended that she have mitral valve repair.  The patient had a minimally invasive mitral valve repair using a Memo 4D ring size 32 (right chest) by Dr. Halford Chessman on 11/28/2018.  She has a mildly reduced EF.   She had a stable echo in May.   ***   *** and I added Cozaar at the last appt.    She had some dyspnea.  BNP was normal at the last visit.   Since I last saw her she has done well.   She might get slightly short of breath if she has to walk up a flight of stairs quickly but she thinks all of this is improved.  Still gets some swelling in her feet and in her hands says she takes her diuretic.  She tolerated the Cozaar increase.  She denies any resting shortness of breath, PND or orthopnea.  She has had no chest pressure, neck or arm discomfort.   Past Medical History:  Diagnosis Date  . Adenomatous colon polyp 12/2012  . Allergic rhinitis   . Anginal pain ([place])   . Burn of unspecified degree of lip(s)   . Cellulitis   . Common migraine   . Diastolic dysfunction   . Dysrhythmia   . External hemorrhoid   . Genital herpes   .  GERD (gastroesophageal reflux disease)   . Hx of migraines   . Hyperlipidemia   . Hypertension   . Lynch syndrome   . Mitral regurgitation   . MVP (mitral valve prolapse)   . Palpitations   . Plica syndrome of left knee   . 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 ABDOMINAL HYSTERECTOMY    . CHOLECYSTECTOMY N/A 01/16/2020   Procedure: LAPAROSCOPIC CHOLECYSTECTOMY WITH INTRAOPERATIVE CHOLANGIOGRAM;  Surgeon: [name] Gemma, MD;  Location: [facility];  Service: General;  Laterality: N/A;  . COLONOSCOPY    . KNEE ARTHROSCOPY WITH MEDIAL MENISECTOMY Left 06/07/2013   Procedure: LEFT KNEE ARTHROSCOPY WITH SYNOVECTOMY LIMITED, ARTHROSCOPY KNEE WITH DEBRIDEMENT/SHAVING (CHONDROPLASTY), ARTHROSCOPY KNEE  ChondroplastyWITH MEDIAL AND LATERAL  MENISECTOMY;  Surgeon: [name] Butters, MD;  Location: [place];  Service: Orthopedics;  Laterality: Left;  . left knee arthroscopy    . MITRAL VALVE REPAIR Right 11/28/2018   Procedure: MINIMALLY INVASIVE MITRAL VALVE REPAIR (MVR) - Using Memo 4D Ring Size 32;  Surgeon: [name] Alberts, MD;  Location: [place];  Service: Open Heart Surgery;  Laterality: Right;  .  patrial hysterectomy    . POLYPECTOMY    . SYNOVECTOMY Left 06/07/2013   Procedure: SYNOVECTOMY;  Surgeon: [name] Butters, MD;  Location: [place];  Service: Orthopedics;  Laterality: Left;  . TEE WITHOUT CARDIOVERSION  10/11/2011   Procedure: TRANSESOPHAGEAL ECHOCARDIOGRAM (TEE);  Surgeon: [name] Hector, MD;  Location: [place];  Service: Cardiovascular;  Laterality: N/A;  . TEE WITHOUT CARDIOVERSION N/A 11/28/2018   Procedure: TRANSESOPHAGEAL ECHOCARDIOGRAM (TEE);  Surgeon: [name] Alberts, MD;  Location: [place];  Service: Open Heart Surgery;  Laterality: N/A;  . TUBAL LIGATION    . WISDOM TOOTH EXTRACTION       Current  Outpatient Medications  Medication Sig Dispense Refill  . aspirin EC 81 MG tablet Take 81 mg by mouth daily.    . Clindamycin-Benzoyl Per, Refr, gel Apply 1 application topically daily.    Marland Kitchen losartan (COZAAR) 50 MG tablet Take 1 tablet (50 mg total) by mouth daily. 90 tablet 3  . metoprolol succinate (TOPROL-XL) 100 MG 24 hr tablet Take 1 tablet (100 mg total) by mouth daily. Take with or immediately following a meal. 90 tablet 3  . omega-3 acid ethyl esters (LOVAZA) 1 g capsule Take 2 capsules (2 g total) by mouth 2 (two) times daily. 360 capsule 1  . tazarotene (AVAGE) 0.1 % cream Apply 1 application topically at bedtime.   2  . torsemide (DEMADEX) 10 MG tablet Take 1 tablet (10 mg total) by mouth 2 (two) times daily. (Patient taking differently: Take 20 mg by mouth daily. ) 180 tablet 3  . traMADol (ULTRAM) 50 MG tablet Take 1-2 tablets (50-100 mg total) by mouth every 6 (six) hours as needed. 15 tablet 0  . Ubrogepant (UBRELVY) 100 MG TABS Take 1 tablet by mouth as needed. (Patient taking differently: Take 1 tablet by mouth daily as needed (migraine.). ) 30 tablet 1   No current facility-administered medications for this visit.    Allergies:   Patient has no known allergies.    ROS:  Please see the history of present illness.   Otherwise, review of systems are positive for ***.   All other systems are reviewed and negative.    PHYSICAL EXAM: VS:  LMP 01/12/2012  , BMI There is no height or weight on file to calculate BMI. GENERAL:  Well appearing NECK:  No jugular venous distention, waveform within normal limits, carotid upstroke brisk and symmetric, no bruits, no thyromegaly LUNGS:  Clear to auscultation bilaterally CHEST:  Unremarkable HEART:  PMI not displaced or sustained,S1 and S2 within normal limits, no S3, no S4, no clicks, no rubs, *** murmurs ABD:  Flat, positive bowel sounds normal in frequency in pitch, no bruits, no rebound, no guarding, no midline pulsatile mass, no  hepatomegaly, no splenomegaly EXT:  2 plus pulses throughout, no edema, no cyanosis no clubbing    ***GENERAL:  Well appearing NECK:  No jugular venous distention, waveform within normal limits, carotid upstroke brisk and symmetric, no bruits, no thyromegaly LUNGS:  Clear to auscultation bilaterally CHEST:  Well healed surgical scar.  HEART:  PMI not displaced or sustained,S1 and S2 within normal limits, no S3, no S4, no clicks, no rubs, no murmurs ABD:  Flat, positive bowel sounds normal in frequency in pitch, no bruits, no rebound, no guarding, no midline pulsatile mass, no hepatomegaly, no splenomegaly EXT:  2 plus pulses throughout, no edema, no cyanosis no clubbing    EKG:  EKG is *** ordered today. ***  Recent Labs: 07/16/2019: BNP 13.3 10/29/2019: ALT 11 01/08/2020: Hemoglobin 14.3; Platelets 344 03/05/2020: BUN 12; Creat 1.12; Magnesium 2.0; Potassium 3.7; Sodium 141; TSH 3.07    Lipid Panel    Component Value Date/Time   CHOL 237 (H) 03/05/2020 1619   TRIG 381 (H) 03/05/2020 1619   TRIG 79 12/26/2011 0000   HDL 49 (L) 03/05/2020 1619   CHOLHDL 4.8 03/05/2020 1619   VLDL 28.4 07/10/2018 1114   LDLCALC 134 (H) 03/05/2020 1619   LDLCALC 120 12/26/2011 0000      Wt Readings from Last 3 Encounters:  03/05/20 191 lb (86.6 kg)  01/16/20 188 lb (85.3 kg)  01/08/20 188 lb (85.3 kg)      Other studies Reviewed: Additional studies/ records that were reviewed today include: ***. Review of the above records demonstrates:  ***   ASSESSMENT AND PLAN:  Mitral valve disease:     She is status post repair was OK in May.  ***.  I will order an echocardiogram from May and she will see Dr. Ihor Gully after this.  She understands endocarditis prophylaxis.   Transient global amnesia:    ***  She has had no further issues with this.   Hypertension:   ***  Her blood pressure is controlled in the context of managing her medicines for her mild cardiomyopathy.   Cardiomyopathy:    ***  I  do not think at this point her heart rate of blood pressure will tolerate much in the way of med titration.  She will remain on the meds as listed.  We will follow up the echo as above.   Dyspnea:   *** This is improved.  Covid education:   ***  She should be able to get a vaccine soon at work.   Current medicines are reviewed at length with the patient today.  The patient does not have concerns regarding medicines.  The following changes have been made:  ***  Labs/ tests ordered today include:  ***  No orders of the defined types were placed in this encounter.   Disposition:   FU with me in *** months.      Signed, [name] Breeding, MD  03/12/2020 9:24 PM    [facility]

## 2020-03-13 ENCOUNTER — Ambulatory Visit: Payer: Managed Care, Other (non HMO) | Admitting: Cardiology

## 2020-03-25 ENCOUNTER — Ambulatory Visit: Payer: 59 | Admitting: Family Medicine
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
## 2020-04-01 ENCOUNTER — Encounter: Payer: Self-pay | Admitting: Gastroenterology

## 2020-04-27 ENCOUNTER — Ambulatory Visit: Payer: Managed Care, Other (non HMO) | Admitting: Cardiology
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
## 2020-05-08 ENCOUNTER — Encounter: Payer: Self-pay | Admitting: Cardiology

## 2020-05-08 ENCOUNTER — Other Ambulatory Visit: Payer: Self-pay

## 2020-05-08 ENCOUNTER — Ambulatory Visit: Payer: Managed Care, Other (non HMO) | Admitting: Cardiology

## 2020-05-08 VITALS — BP 92/68 | HR 80 | Ht 63.0 in | Wt 197.6 lb

## 2020-05-08 DIAGNOSIS — I420 Dilated cardiomyopathy: Secondary | ICD-10-CM

## 2020-05-08 DIAGNOSIS — I340 Nonrheumatic mitral (valve) insufficiency: Secondary | ICD-10-CM | POA: Diagnosis not present

## 2020-05-08 DIAGNOSIS — I10 Essential (primary) hypertension: Secondary | ICD-10-CM | POA: Diagnosis not present

## 2020-05-08 DIAGNOSIS — R0602 Shortness of breath: Secondary | ICD-10-CM | POA: Diagnosis not present

## 2020-05-08 NOTE — Patient Instructions (Addendum)
Medication Instructions:  No changes *If you need a refill on your cardiac medications before your next appointment, please call your pharmacy*   Lab Work: None ordered If you have labs (blood work) drawn today and your tests are completely normal, you will receive your results only by: Marland Kitchen MyChart Message (if you have MyChart) OR . A paper copy in the mail If you have any lab test that is abnormal or we need to change your treatment, we will call you to review the results.   Testing/Procedures: Your physician has requested that you have an echocardiogram. Echocardiography is a painless test that uses sound waves to create images of your heart. It provides your doctor with information about the size and shape of your heart and how well your heart's chambers and valves are working. This procedure takes approximately one hour. There are no restrictions for this procedure. This test is performed at [address].    Follow-Up: At [facility], you and your health needs are our priority.  As part of our continuing mission to provide you with exceptional heart care, we have created designated Provider Care Teams.  These Care Teams include your primary Cardiologist (physician) and Advanced Practice Providers (APPs -  Physician Assistants and Nurse Practitioners) who all work together to provide you with the care you need, when you need it.  We recommend signing up for the patient portal called "MyChart".  Sign up information is provided on this After Visit Summary.  MyChart is used to connect with patients for Virtual Visits (Telemedicine).  Patients are able to view lab/test results, encounter notes, upcoming appointments, etc.  Non-urgent messages can be sent to your provider as well.   To learn more about what you can do with MyChart, go to [email or web address].    Your next appointment:   6 month(s), after your echocardiogram  The format for your next appointment:   In  Person  Provider:   [name], MD   Other Instructions None

## 2020-05-15 ENCOUNTER — Other Ambulatory Visit: Payer: Self-pay

## 2020-05-15 ENCOUNTER — Ambulatory Visit (AMBULATORY_SURGERY_CENTER): Payer: Self-pay | Admitting: *Deleted

## 2020-05-15 VITALS — Ht 63.0 in | Wt 201.0 lb

## 2020-05-15 DIAGNOSIS — Z1509 Genetic susceptibility to other malignant neoplasm: Secondary | ICD-10-CM

## 2020-05-15 DIAGNOSIS — Z86010 Personal history of colonic polyps: Secondary | ICD-10-CM

## 2020-05-15 MED ORDER — PLENVU 140 G PO SOLR: 1.0000 | ORAL | 0 refills | Status: DC

## 2020-05-15 NOTE — Progress Notes (Signed)
cov vax x 2  No egg or soy allergy known to patient  No issues with past sedation with any surgeries or procedures no intubation problems in the past  No FH of Malignant Hyperthermia No diet pills per patient No home 02 use per patient  No blood thinners per patient  Pt denies issues with constipation  No A fib or A flutter  EMMI video to pt or via [place] 19 guidelines implemented in [place] today with Pt and RN   Plenvu Coupon given to pt in [place] today , Code to Pharmacy   Due to the COVID-19 pandemic we are asking patients to follow these guidelines. Please only bring one care partner. Please be aware that your care partner may wait in the car in the parking lot or if they feel like they will be too hot to wait in the car, they may wait in the lobby on the 4th floor. All care partners are required to wear a mask the entire time (we do not have any that we can provide them), they need to practice social distancing, and we will do a Covid check for all patient's and care partners when you arrive. Also we will check their temperature and your temperature. If the care partner waits in their car they need to stay in the parking lot the entire time and we will call them on their cell phone when the patient is ready for discharge so they can bring the car to the front of the building. Also all patient's will need to wear a mask into building.

## 2020-05-18 ENCOUNTER — Telehealth: Payer: Self-pay | Admitting: Gastroenterology

## 2020-05-18 DIAGNOSIS — Z1509 Genetic susceptibility to other malignant neoplasm: Secondary | ICD-10-CM

## 2020-05-18 MED ORDER — NA SULFATE-K SULFATE-MG SULF 17.5-3.13-1.6 GM/177ML PO SOLN: ORAL | 0 refills | Status: DC

## 2020-05-18 NOTE — Telephone Encounter (Signed)
Patient called asking for Lelan Pons stated she was told to call back if th ePlenvu came out to more then $50.00

## 2020-05-18 NOTE — Telephone Encounter (Signed)
Patient states the Plenvu is $60 and she wants to do the suprep. New suprep RX sent to patient's pharmacy and new prep instructions sent to her Mychart- pt is aware. Pt understands not to drink both bowel preps for the procedure to only pick up one prep from the pharmacy.

## 2020-05-22 ENCOUNTER — Ambulatory Visit: Payer: Managed Care, Other (non HMO) | Attending: Internal Medicine

## 2020-05-22 DIAGNOSIS — Z23 Encounter for immunization: Secondary | ICD-10-CM

## 2020-05-22 NOTE — Progress Notes (Signed)
   Covid-19 Vaccination Clinic  Name:  [name]    MRN: [id number] DOB: July 05, 1975  05/22/2020  [name] didn't come to the observation area.   Immunizations Administered    No immunizations on file.

## 2020-06-01 ENCOUNTER — Other Ambulatory Visit: Payer: Self-pay

## 2020-06-01 ENCOUNTER — Ambulatory Visit (AMBULATORY_SURGERY_CENTER): Payer: Self-pay | Admitting: Gastroenterology

## 2020-06-01 ENCOUNTER — Encounter: Payer: Self-pay | Admitting: Gastroenterology

## 2020-06-01 VITALS — BP 117/76 | HR 70 | Temp 97.1°F | Resp 26 | Ht 63.0 in | Wt 201.0 lb

## 2020-06-01 DIAGNOSIS — D124 Benign neoplasm of descending colon: Secondary | ICD-10-CM

## 2020-06-01 DIAGNOSIS — Z1509 Genetic susceptibility to other malignant neoplasm: Secondary | ICD-10-CM

## 2020-06-01 DIAGNOSIS — K635 Polyp of colon: Secondary | ICD-10-CM

## 2020-06-01 DIAGNOSIS — D123 Benign neoplasm of transverse colon: Secondary | ICD-10-CM

## 2020-06-01 DIAGNOSIS — Z86010 Personal history of colonic polyps: Secondary | ICD-10-CM

## 2020-06-01 DIAGNOSIS — Z8371 Family history of colonic polyps: Secondary | ICD-10-CM

## 2020-06-01 LAB — HM COLONOSCOPY

## 2020-06-01 MED ORDER — SODIUM CHLORIDE 0.9 % IV SOLN
500.0000 mL | INTRAVENOUS | Status: DC
Start: 2020-06-01 — End: 2020-06-01

## 2020-06-01 NOTE — Progress Notes (Signed)
Called to room to assist during endoscopic procedure.  Patient ID and intended procedure confirmed with present staff. Received instructions for my participation in the procedure from the performing physician.  

## 2020-06-01 NOTE — Op Note (Signed)
[facility] Patient Name: [name] Procedure Date: 06/01/2020 8:04 AM MRN: [id number] Endoscopist: Ladene Artist , MD Age: 44 Referring MD:  Date of Birth: October 16, 1975 Gender: Female Account #: [id number] Procedure:                Colonoscopy Indications:              Colon cancer screening in patient at increased                            risk: Personal history of Lynch syndrome. Personal                            history of adenomatous colon polyps. Family history                            of hereditary nonpolyposis colorectal cancer in                            1st-degree relative Medicines:                Monitored Anesthesia Care Procedure:                Pre-Anesthesia Assessment:                           - Prior to the procedure, a History and Physical                            was performed, and patient medications and                            allergies were reviewed. The patient's tolerance of                            previous anesthesia was also reviewed. The risks                            and benefits of the procedure and the sedation                            options and risks were discussed with the patient.                            All questions were answered, and informed consent                            was obtained. Prior Anticoagulants: The patient has                            taken no previous anticoagulant or antiplatelet                            agents. ASA Grade Assessment: II - A patient with  mild systemic disease. After reviewing the risks                            and benefits, the patient was deemed in                            satisfactory condition to undergo the procedure.                           After obtaining informed consent, the colonoscope                            was passed under direct vision. Throughout the                            procedure, the patient's blood  pressure, pulse, and                            oxygen saturations were monitored continuously. The                            Colonoscope was introduced through the anus and                            advanced to the the cecum, identified by                            appendiceal orifice and ileocecal valve. The                            ileocecal valve, appendiceal orifice, and rectum                            were photographed. The quality of the bowel                            preparation was good. The colonoscopy was performed                            without difficulty. The patient tolerated the                            procedure well. Scope In: 8:10:43 AM Scope Out: 8:29:11 AM Scope Withdrawal Time: 0 hours 15 minutes 30 seconds  Total Procedure Duration: 0 hours 18 minutes 28 seconds  Findings:                 The perianal and digital rectal examinations were                            normal.                           Two sessile polyps were found in the descending  colon and transverse colon. The polyps were 3 to 9                            mm in size. These polyps were removed with a cold                            snare. Resection and retrieval were complete.                           Scattered small-mouthed diverticula were found in                            the entire colon.                           Internal hemorrhoids were found during                            retroflexion. The hemorrhoids were small and Grade                            I (internal hemorrhoids that do not prolapse).                           The exam was otherwise without abnormality on                            direct and retroflexion views. Complications:            No immediate complications. Estimated blood loss:                            None. Estimated Blood Loss:     Estimated blood loss: none. Impression:               - Two 3 to 9 mm polyps in the  descending colon and                            in the transverse colon, removed with a cold snare.                            Resected and retrieved.                           - Mild diverticulosis in the entire examined colon.                           - Internal hemorrhoids.                           - The examination was otherwise normal on direct                            and retroflexion views. Recommendation:           - Repeat colonoscopy in 1 year  for surveillance                            based on pathology results.                           - Patient has a contact number available for                            emergencies. The signs and symptoms of potential                            delayed complications were discussed with the                            patient. Return to normal activities tomorrow.                            Written discharge instructions were provided to the                            patient.                           - Resume previous diet.                           - Continue present medications.                           - Await pathology results. Ladene Artist, MD 06/01/2020 8:42:13 AM This report has been signed electronically.

## 2020-06-01 NOTE — Progress Notes (Signed)
Report to PACU, RN, vss, BBS= Clear.  

## 2020-06-01 NOTE — Patient Instructions (Signed)
Handouts given:  Polyps, Diverticulosis, Hemorrhoids Resume previous diet Continue current medications Await pathology results  YOU HAD AN ENDOSCOPIC PROCEDURE TODAY AT [facility]:   Refer to the procedure report that was given to you for any specific questions about what was found during the examination.  If the procedure report does not answer your questions, please call your gastroenterologist to clarify.  If you requested that your care partner not be given the details of your procedure findings, then the procedure report has been included in a sealed envelope for you to review at your convenience later.  YOU SHOULD EXPECT: Some feelings of bloating in the abdomen. Passage of more gas than usual.  Walking can help get rid of the air that was put into your GI tract during the procedure and reduce the bloating. If you had a lower endoscopy (such as a colonoscopy or flexible sigmoidoscopy) you may notice spotting of blood in your stool or on the toilet paper. If you underwent a bowel prep for your procedure, you may not have a normal bowel movement for a few days.  Please Note:  You might notice some irritation and congestion in your nose or some drainage.  This is from the oxygen used during your procedure.  There is no need for concern and it should clear up in a day or so.  SYMPTOMS TO REPORT IMMEDIATELY:   Following lower endoscopy (colonoscopy or flexible sigmoidoscopy):  Excessive amounts of blood in the stool  Significant tenderness or worsening of abdominal pains  Swelling of the abdomen that is new, acute  Fever of 100F or higher  For urgent or emergent issues, a gastroenterologist can be reached at any hour by calling [phone]. Do not use MyChart messaging for urgent concerns.   DIET:  We do recommend a small meal at first, but then you may proceed to your regular diet.  Drink plenty of fluids but you should avoid alcoholic beverages for 24  hours.  ACTIVITY:  You should plan to take it easy for the rest of today and you should NOT DRIVE or use heavy machinery until tomorrow (because of the sedation medicines used during the test).    FOLLOW UP: Our staff will call the number listed on your records 48-72 hours following your procedure to check on you and address any questions or concerns that you may have regarding the information given to you following your procedure. If we do not reach you, we will leave a message.  We will attempt to reach you two times.  During this call, we will ask if you have developed any symptoms of COVID 19. If you develop any symptoms (ie: fever, flu-like symptoms, shortness of breath, cough etc.) before then, please call [phone].  If you test positive for Covid 19 in the 2 weeks post procedure, please call and report this information to [place].    If any biopsies were taken you will be contacted by phone or by letter within the next 1-3 weeks.  Please call us at [phone] if you have not heard about the biopsies in 3 weeks.   SIGNATURES/CONFIDENTIALITY: You and/or your care partner have signed paperwork which will be entered into your electronic medical record.  These signatures attest to the fact that that the information above on your After Visit Summary has been reviewed and is understood.  Full responsibility of the confidentiality of this discharge information lies with you and/or your care-partner.

## 2020-06-01 NOTE — Progress Notes (Signed)
previsit done by [facility].  Pt states no changes in health hx.  Vs done by [facility]

## 2020-06-03 ENCOUNTER — Other Ambulatory Visit: Payer: Self-pay | Admitting: Thoracic Surgery (Cardiothoracic Vascular Surgery)

## 2020-06-03 ENCOUNTER — Telehealth: Payer: Self-pay | Admitting: *Deleted

## 2020-06-03 ENCOUNTER — Telehealth: Payer: Self-pay

## 2020-06-03 NOTE — Telephone Encounter (Signed)
NO ANSWER, MESSAGE LEFT FOR PATIENT. 

## 2020-06-03 NOTE — Telephone Encounter (Signed)
°  Follow up Call-  Call back number 06/01/2020 03/30/2019 03/27/2018  Post procedure Call Back phone  # [phone]  Permission to leave phone message Yes Yes Yes  Some recent data might be hidden     Patient questions:  Do you have a fever, pain , or abdominal swelling? No. Pain Score  0 *  Have you tolerated food without any problems? Yes.    Have you been able to return to your normal activities? Yes.    Do you have any questions about your discharge instructions: Diet   No. Medications  No. Follow up visit  No.  Do you have questions or concerns about your Care? No.  Actions: * If pain score is 4 or above: No action needed, pain <4.  1. Have you developed a fever since your procedure? no  2.   Have you had an respiratory symptoms (SOB or cough) since your procedure? no  3.   Have you tested positive for COVID 19 since your procedure no  4.   Have you had any family members/close contacts diagnosed with the COVID 19 since your procedure?  no   If yes to any of these questions please route to Joylene John, RN and Joella Prince, RN

## 2020-06-18 ENCOUNTER — Telehealth (INDEPENDENT_AMBULATORY_CARE_PROVIDER_SITE_OTHER): Payer: Medicaid Other | Admitting: Family Medicine

## 2020-06-18 DIAGNOSIS — R112 Nausea with vomiting, unspecified: Secondary | ICD-10-CM

## 2020-06-18 DIAGNOSIS — R197 Diarrhea, unspecified: Secondary | ICD-10-CM

## 2020-06-18 MED ORDER — ONDANSETRON 4 MG PO TBDP: 4.0000 mg | ORAL_TABLET | Freq: Three times a day (TID) | ORAL | 0 refills | Status: DC | PRN

## 2020-06-18 NOTE — Progress Notes (Signed)
Virtual Visit via Telephone Note  I connected with [name] on 06/18/20 at  3:20 PM EST by telephone and verified that I am speaking with the correct person using two identifiers.   I discussed the limitations, risks, security and privacy concerns of performing an evaluation and management service by telephone and the availability of in person appointments. I also discussed with the patient that there may be a patient responsible charge related to this service. The patient expressed understanding and agreed to proceed.  Location patient: home, [place] Location provider: work or home office Participants present for the call: patient, provider Patient did not have a visit with me in the prior 7 days to address this/these issue(s).   History of Present Illness:  Acute telemedicine visit for NVD: -Onset: yesterday -Symptoms include: nausea, vomiting, yesterday, diarrhea later yesterday evening - multiple episodes, today 3 episodes of diarrhea, no vomiting today, low grade temp last night, some abd cramping before BMs -Denies:resp symptoms, hematochezia, melena, persistent or focal abd pain, known sick contacts (however reports is around a lot of people at work) -Temp normal today  -COVID-19 vaccine status: fully vaccinated and had booster; and had covid -denies any chance of pregnancy   Observations/Objective: Patient sounds cheerful and well on the phone. I do not appreciate any SOB. Speech and thought processing are grossly intact. Patient reported vitals:  Assessment and Plan:  Nausea and vomiting, intractability of vomiting not specified, unspecified vomiting type  Diarrhea, unspecified type  -we discussed possible serious and likely etiologies, options for evaluation and workup, limitations of telemedicine visit vs in person visit, treatment, treatment risks and precautions. Pt prefers to treat via telemedicine empirically rather than in person at this moment.  Query viral  gastroenteritis versus other.  She seems to be improving some today, so has opted for trial of Zofran for nausea, oral rehydration and Imodium.  Also advised avoiding dairy and red meat for 1 week.  Discussed possibility of Covid, a friend of hers and healthcare told her that Covid can start with GI symptoms.  Probably less likely, given she is fully vaccinated plus had her booster, however did advised that she go ahead and do a test and advised staying home until symptoms have resolved for 48 hours. Work/School slipped offered: provided in patient instructions  Scheduled follow up with PCP offered: Agrees to follow-up if needed. Advised to seek prompt in person care if worsening, new symptoms arise, or if is not improving with treatment. Advised of options for inperson care in case PCP office not available. Did let the patient know that I only do telemedicine shifts for [facility] on Tuesdays and Thursdays and advised a follow up visit with PCP or at an [facility] if has further questions or concerns.   Follow Up Instructions:  I did not refer this patient for an OV with me in the next 24 hours for this/these issue(s).  I discussed the assessment and treatment plan with the patient. The patient was provided an opportunity to ask questions and all were answered. The patient agreed with the plan and demonstrated an understanding of the instructions.   I spent 19 minutes on this encounter.   Lucretia Kern, DO

## 2020-06-18 NOTE — Patient Instructions (Signed)
   ---------------------------------------------------------------------------------------------------------------------------      WORK SLIP:  Patient [name],  [birth year], was seen for a medical visit today, 06/18/20 . Please excuse from work until symptoms have resolved for 48-72 hours.  Sincerely: E-signature: Dr. Colin Benton, DO [facility] Ph: [phone]   ------------------------------------------------------------------------------------------------------------------------------   -I sent the medication(s) we discussed to your pharmacy: Meds ordered this encounter  Medications  . ondansetron (ZOFRAN ODT) 4 MG disintegrating tablet    Sig: Take 1 tablet (4 mg total) by mouth every 8 (eight) hours as needed for nausea or vomiting.    Dispense:  20 tablet    Refill:  0   -Can use imodium as needed per instructions for diarrhea  -drink plenty of fluids (with electrolytes if not eating - such as Pedialyte or soup broth)  -avoid dairy and red meat for 1 week  I hope you are feeling better soon!  Seek in person care promptly if your symptoms worsen, new concerns arise or you are not improving with treatment.  It was nice to meet you today. I help [facility] out with telemedicine visits on Tuesdays and Thursdays and am available for visits on those days. If you have any concerns or questions following this visit please schedule a follow up visit with your Primary Care doctor or seek care at a local urgent care clinic to avoid delays in care.

## 2020-06-22 ENCOUNTER — Encounter: Payer: Self-pay | Admitting: Gastroenterology

## 2020-07-04 HISTORY — PX: SALPINGOOPHORECTOMY: SHX82

## 2020-07-09 ENCOUNTER — Other Ambulatory Visit: Payer: Medicaid Other

## 2020-07-09 DIAGNOSIS — Z20822 Contact with and (suspected) exposure to covid-19: Secondary | ICD-10-CM

## 2020-07-10 LAB — NOVEL CORONAVIRUS, NAA: SARS-CoV-2, NAA: DETECTED — AB

## 2020-07-10 LAB — SARS-COV-2, NAA 2 DAY TAT

## 2020-07-10 LAB — SPECIMEN STATUS REPORT

## 2020-07-12 ENCOUNTER — Encounter: Payer: Self-pay | Admitting: Internal Medicine

## 2020-07-13 ENCOUNTER — Other Ambulatory Visit: Payer: Self-pay | Admitting: Internal Medicine

## 2020-07-13 DIAGNOSIS — U071 COVID-19: Secondary | ICD-10-CM

## 2020-07-13 DIAGNOSIS — J208 Acute bronchitis due to other specified organisms: Secondary | ICD-10-CM | POA: Insufficient documentation

## 2020-07-13 MED ORDER — HYDROCOD POLST-CPM POLST ER 10-8 MG/5ML PO SUER
5.0000 mL | Freq: Two times a day (BID) | ORAL | 0 refills | Status: DC | PRN
Start: 2020-07-13 — End: 2021-02-16

## 2020-07-22 ENCOUNTER — Encounter: Payer: Self-pay | Admitting: Internal Medicine

## 2020-07-23 ENCOUNTER — Telehealth: Payer: Self-pay | Admitting: Internal Medicine

## 2020-08-05 ENCOUNTER — Other Ambulatory Visit: Payer: Self-pay | Admitting: Cardiology

## 2020-08-05 DIAGNOSIS — I5032 Chronic diastolic (congestive) heart failure: Secondary | ICD-10-CM

## 2020-08-18 ENCOUNTER — Telehealth: Payer: Self-pay | Admitting: Cardiology

## 2020-08-18 NOTE — Telephone Encounter (Signed)
Pt c/o of Chest Pain: STAT if CP now or developed within 24 hours  1. Are you having CP right now? no  2. Are you experiencing any other symptoms (ex. SOB, nausea, vomiting, sweating)? No but can sometimes feel her heart racing  3. How long have you been experiencing CP? Few weeks  4. Is your CP continuous or coming and going? Coming and going  5. Have you taken Nitroglycerin? No    Scheduled pt to see Dr. Percival Spanish on 08/24/20 ?

## 2020-08-18 NOTE — Telephone Encounter (Signed)
Attempted to call patient, left message for patient to call back to office.   
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
## 2020-08-24 ENCOUNTER — Encounter: Payer: Self-pay | Admitting: Cardiology

## 2020-08-24 ENCOUNTER — Ambulatory Visit (INDEPENDENT_AMBULATORY_CARE_PROVIDER_SITE_OTHER): Payer: 59 | Admitting: Cardiology

## 2020-08-24 ENCOUNTER — Other Ambulatory Visit: Payer: Self-pay

## 2020-08-24 VITALS — BP 108/80 | HR 92 | Ht 63.0 in | Wt 195.4 lb

## 2020-08-24 DIAGNOSIS — Z9889 Other specified postprocedural states: Secondary | ICD-10-CM | POA: Diagnosis not present

## 2020-08-24 DIAGNOSIS — I10 Essential (primary) hypertension: Secondary | ICD-10-CM | POA: Diagnosis not present

## 2020-08-24 DIAGNOSIS — R0602 Shortness of breath: Secondary | ICD-10-CM

## 2020-08-24 NOTE — Telephone Encounter (Signed)
OV today with Dr. Percival Spanish 2/21

## 2020-08-24 NOTE — Patient Instructions (Signed)
Medication Instructions:  Your physician recommends that you continue on your current medications as directed. Please refer to the Current Medication list given to you today.  *If you need a refill on your cardiac medications before your next appointment, please call your pharmacy*   Lab Work: CBC, BNP, TSH, Sed rate, CRP today  If you have labs (blood work) drawn today and your tests are completely normal, you will receive your results only by: Marland Kitchen MyChart Message (if you have MyChart) OR . A paper copy in the mail If you have any lab test that is abnormal or we need to change your treatment, we will call you to review the results.   Testing/Procedures: Echo as scheduled  Follow-Up: At [facility], you and your health needs are our priority.  As part of our continuing mission to provide you with exceptional heart care, we have created designated Provider Care Teams.  These Care Teams include your primary Cardiologist (physician) and Advanced Practice Providers (APPs -  Physician Assistants and Nurse Practitioners) who all work together to provide you with the care you need, when you need it.  We recommend signing up for the patient portal called "MyChart".  Sign up information is provided on this After Visit Summary.  MyChart is used to connect with patients for Virtual Visits (Telemedicine).  Patients are able to view lab/test results, encounter notes, upcoming appointments, etc.  Non-urgent messages can be sent to your provider as well.   To learn more about what you can do with MyChart, go to [email or web address].    Your next appointment:   6 month(s)  The format for your next appointment:   In Person  Provider:   [name], MD

## 2020-08-25 LAB — SEDIMENTATION RATE: Sed Rate: 46 mm/hr — ABNORMAL HIGH (ref 0–32)

## 2020-08-25 LAB — CBC
Hematocrit: 44.1 % (ref 34.0–46.6)
Hemoglobin: 14.7 g/dL (ref 11.1–15.9)
MCH: 30.2 pg (ref 26.6–33.0)
MCHC: 33.3 g/dL (ref 31.5–35.7)
MCV: 91 fL (ref 79–97)
Platelets: 331 10*3/uL (ref 150–450)
RBC: 4.87 x10E6/uL (ref 3.77–5.28)
RDW: 14 % (ref 11.7–15.4)
WBC: 7.3 10*3/uL (ref 3.4–10.8)

## 2020-08-25 LAB — C-REACTIVE PROTEIN: CRP: 4 mg/L (ref 0–10)

## 2020-08-25 LAB — BRAIN NATRIURETIC PEPTIDE: BNP: 13.1 pg/mL (ref 0.0–100.0)

## 2020-08-25 LAB — TSH: TSH: 2.01 u[IU]/mL (ref 0.450–4.500)

## 2020-10-22 ENCOUNTER — Encounter: Payer: Self-pay | Admitting: Internal Medicine

## 2020-10-22 ENCOUNTER — Telehealth (INDEPENDENT_AMBULATORY_CARE_PROVIDER_SITE_OTHER): Payer: 59 | Admitting: Internal Medicine

## 2020-10-22 DIAGNOSIS — K529 Noninfective gastroenteritis and colitis, unspecified: Secondary | ICD-10-CM | POA: Diagnosis not present

## 2020-10-22 MED ORDER — ONDANSETRON 4 MG PO TBDP
4.0000 mg | ORAL_TABLET | Freq: Three times a day (TID) | ORAL | 0 refills | Status: DC | PRN
Start: 2020-10-22 — End: 2021-02-16

## 2020-10-22 NOTE — Assessment & Plan Note (Signed)
Advised push fluids, rx zofran odt. Given information about brat diet. Work note done and instructions to hold torsemide while actively having diarrhea and not eating/drinking well.

## 2020-10-22 NOTE — Progress Notes (Signed)
Virtual Visit via Video Note  I connected with [name] on 10/22/20 at 10:00 AM EDT by a video enabled telemedicine application and verified that I am speaking with the correct person using two identifiers.  The patient and the provider were at separate locations throughout the entire encounter. Patient location: home, Provider location: work   I discussed the limitations of evaluation and management by telemedicine and the availability of in person appointments. The patient expressed understanding and agreed to proceed. The patient and the provider were the only parties present for the visit unless noted in HPI below.  History of Present Illness: The patient is a 45 y.o. female with visit for stomach issues. Started last night. Has nausea and vomiting and diarrhea. Denies blood in diarrhea or vomit. Overall it is not improving. Has tried nothing. Having a hard time with getting liquids in due to moderate to severe nausea. Vomited one time at onset and not again. Diarrhea multiple times today already, last about 10 minutes ago  Observations/Objective: Appearance: uncomfortable, breathing appears normal, casual grooming, abdomen does not appear distended, throat not well visualized, mental status is A and O times 3  Assessment and Plan: See problem oriented charting  Follow Up Instructions: rx zofran, push fluids brat diet can use imodium otc, hold torsemide today and tomorrow, then resume once tolerating food/liquids well, work note done  I discussed the assessment and treatment plan with the patient. The patient was provided an opportunity to ask questions and all were answered. The patient agreed with the plan and demonstrated an understanding of the instructions.   The patient was advised to call back or seek an in-person evaluation if the symptoms worsen or if the condition fails to improve as anticipated.  Hoyt Koch, MD

## 2020-10-30 ENCOUNTER — Other Ambulatory Visit: Payer: Self-pay | Admitting: Cardiology

## 2020-11-05 ENCOUNTER — Ambulatory Visit (HOSPITAL_COMMUNITY): Payer: 59 | Attending: Cardiovascular Disease

## 2020-11-05 ENCOUNTER — Other Ambulatory Visit: Payer: Self-pay

## 2020-11-05 DIAGNOSIS — I340 Nonrheumatic mitral (valve) insufficiency: Secondary | ICD-10-CM | POA: Diagnosis present

## 2020-11-05 LAB — ECHOCARDIOGRAM COMPLETE
Area-P 1/2: 2.03 cm2
MV VTI: 1.24 cm2
S' Lateral: 3.1 cm

## 2020-11-10 ENCOUNTER — Ambulatory Visit: Payer: 59 | Attending: Internal Medicine

## 2020-11-10 DIAGNOSIS — Z20822 Contact with and (suspected) exposure to covid-19: Secondary | ICD-10-CM

## 2020-11-11 LAB — NOVEL CORONAVIRUS, NAA: SARS-CoV-2, NAA: NOT DETECTED

## 2020-11-11 LAB — SARS-COV-2, NAA 2 DAY TAT

## 2020-12-02 MED ORDER — METOPROLOL SUCCINATE ER 100 MG PO TB24: 100.0000 mg | ORAL_TABLET | Freq: Every day | ORAL | 3 refills | Status: DC

## 2020-12-23 ENCOUNTER — Telehealth: Payer: Self-pay | Admitting: Cardiology

## 2020-12-23 DIAGNOSIS — R002 Palpitations: Secondary | ICD-10-CM

## 2020-12-23 NOTE — Telephone Encounter (Signed)
STAT if HR is under 50 or over 120 (normal HR is 60-100 beats per minute)  What is your heart rate? Yesterday 132 today 101  Do you have a log of your heart rate readings (document readings)? Yes 101 to 132 that's resting  Do you have any other symptoms? Weak

## 2020-12-23 NOTE — Telephone Encounter (Signed)
Returned call to patient who states she is experiencing fast heart rate almost daily. She was last seen in February 2022, shortly after having Covid-19 infection and she had tachycardia at that time. She states it has persisted and she had readings as high as 132 bpm yesterday. HR readings consistently > 100 bpm. Takes Toprol XL 50 mg daily in the morning and notes more symptoms of tachycardia in the afternoon and evening. Recent BP 116/70 mmHg. She previously took Toprol 100 mg but it was decreased in November 2021 due to hypotension. She denies irregular heart; has an Apple watch but does not have ekg feature. I advised her that she may take an additional Toprol 50 mg this evening for HR >100 bpm. Advised her that I will forward message to Dr. Percival Spanish for advice and someone from our office will call her back with his advice. She will continue to  monitor BP and agrees to call back with hypotension, lightheadedness, consistently elevated HR, or other concerns. She thanked me for the call.

## 2020-12-24 ENCOUNTER — Encounter: Payer: Self-pay | Admitting: *Deleted

## 2020-12-24 ENCOUNTER — Ambulatory Visit (INDEPENDENT_AMBULATORY_CARE_PROVIDER_SITE_OTHER): Payer: 59

## 2020-12-24 DIAGNOSIS — R002 Palpitations: Secondary | ICD-10-CM

## 2020-12-24 NOTE — Progress Notes (Unsigned)
Enrolled patient for a 3 day Zio XT monitor to be mailed to patients home  

## 2020-12-24 NOTE — Telephone Encounter (Signed)
Patient voiced understanding of dr hochrein's recommendations.  Order placed and instructions for monitor sent to patient via my chart.

## 2020-12-26 DIAGNOSIS — R002 Palpitations: Secondary | ICD-10-CM | POA: Diagnosis not present

## 2021-01-07 ENCOUNTER — Telehealth: Payer: Self-pay | Admitting: *Deleted

## 2021-01-07 ENCOUNTER — Telehealth: Payer: Self-pay | Admitting: Internal Medicine

## 2021-01-07 NOTE — Telephone Encounter (Signed)
Type of form received: Surgical Clearance  Additional comments:   Received by: Fax  Form should be Faxed to: [phone]   Form should be mailed to:  n/a  Is patient requesting call for pickup: N   Form placed in the Provider's box.  *Attach charge sheet.  Provider will determine charge.*  Was patient informed of  7-10 business day turn around (Y/N)? N

## 2021-01-07 NOTE — Telephone Encounter (Signed)
   [facility] Pre-operative Risk Assessment    Patient Name: [name]  DOB: 07-11-1975 MRN: [id number]  HEARTCARE STAFF:  - IMPORTANT!!!!!! Under Visit Info/Reason for Call, type in Other and utilize the format Clearance MM/DD/YY or Clearance TBD. Do not use dashes or single digits. - Please review there is not already an duplicate clearance open for this procedure. - If request is for dental extraction, please clarify the # of teeth to be extracted. - If the patient is currently at the dentist's office, call Pre-Op Callback Staff (MA/nurse) to input urgent request.  - If the patient is not currently in the dentist office, please route to the Pre-Op pool.  Request for surgical clearance:  What type of surgery is being performed? Laparoscopic bilateral salpingo oophorectomy   When is this surgery scheduled? 03/29/21   What type of clearance is required (medical clearance vs. Pharmacy clearance to hold med vs. Both)? medical  Are there any medications that need to be held prior to surgery and how long?none   Practice name and name of physician performing surgery? [facility]   What is the office phone number? [phone]   7.   What is the office fax number? [phone]  8.   Anesthesia type (None, local, MAC, general) ? general   Fredia Beets 01/07/2021, 4:05 PM  _________________________________________________________________   (provider comments below)

## 2021-01-08 NOTE — Telephone Encounter (Signed)
   Name: [name]  DOB: 24-Dec-1975  MRN: [id number]  Primary Cardiologist: [name], MD  Chart reviewed as part of pre-operative protocol coverage. Because of [name]'s past medical history and time since last visit, she will require a follow-up visit in order to better assess preoperative cardiovascular risk.  She was last seen in clinic 08/2020. Per recent MyChart messages she has been experiencing a variety of symptoms including increased swelling, decreased urination, eye puffiness requiring increase in her torsemide dosing and follow-up labs planned. Therefore, given need for general anesthesia, would advise she have a follow-up appointment in the office prior to surgery date to ensure symptoms have improved (surgery date is end of September).  Pre-op covering staff: - Please schedule appointment and call patient to inform them. If patient already had an upcoming appointment within acceptable timeframe, please add "pre-op clearance" to the appointment notes so provider is aware. - Please contact requesting surgeon's office via preferred method (i.e, phone, fax) to inform them of need for appointment prior to surgery.  Will hold off on routing to MD regarding ASA as clearance does not indicate any need to hold.  Charlie Pitter, PA-C  01/08/2021, 9:02 AM

## 2021-01-08 NOTE — Telephone Encounter (Signed)
Pt has appt 02/23/21 with Dr. Percival Spanish. I have added pre op clearance needed to appt notes. Will send clearance note to MD for upcoming appt. Will send FYI to requesting office pt has appt 02/23/21.

## 2021-01-11 ENCOUNTER — Other Ambulatory Visit: Payer: Self-pay | Admitting: *Deleted

## 2021-01-11 ENCOUNTER — Encounter: Payer: Self-pay | Admitting: *Deleted

## 2021-01-11 DIAGNOSIS — Z9889 Other specified postprocedural states: Secondary | ICD-10-CM

## 2021-01-11 DIAGNOSIS — Z79899 Other long term (current) drug therapy: Secondary | ICD-10-CM

## 2021-01-11 DIAGNOSIS — I10 Essential (primary) hypertension: Secondary | ICD-10-CM

## 2021-01-11 LAB — BASIC METABOLIC PANEL
BUN/Creatinine Ratio: 9 (ref 9–23)
BUN: 13 mg/dL (ref 6–24)
CO2: 25 mmol/L (ref 20–29)
Calcium: 9 mg/dL (ref 8.7–10.2)
Chloride: 100 mmol/L (ref 96–106)
Creatinine, Ser: 1.4 mg/dL — ABNORMAL HIGH (ref 0.57–1.00)
Glucose: 66 mg/dL (ref 65–99)
Potassium: 3.6 mmol/L (ref 3.5–5.2)
Sodium: 140 mmol/L (ref 134–144)
eGFR: 47 mL/min/{1.73_m2} — ABNORMAL LOW (ref >59)

## 2021-01-12 ENCOUNTER — Emergency Department (HOSPITAL_BASED_OUTPATIENT_CLINIC_OR_DEPARTMENT_OTHER)
Admission: EM | Admit: 2021-01-12 | Discharge: 2021-01-12 | Disposition: A | Discharge status: Home / Self Care | Payer: 59 | Attending: Emergency Medicine | Admitting: Emergency Medicine

## 2021-01-12 ENCOUNTER — Encounter (HOSPITAL_BASED_OUTPATIENT_CLINIC_OR_DEPARTMENT_OTHER): Payer: Self-pay | Admitting: Emergency Medicine

## 2021-01-12 ENCOUNTER — Emergency Department (HOSPITAL_BASED_OUTPATIENT_CLINIC_OR_DEPARTMENT_OTHER): Payer: 59

## 2021-01-12 ENCOUNTER — Other Ambulatory Visit: Payer: Self-pay

## 2021-01-12 DIAGNOSIS — E8770 Fluid overload, unspecified: Secondary | ICD-10-CM | POA: Insufficient documentation

## 2021-01-12 DIAGNOSIS — E876 Hypokalemia: Secondary | ICD-10-CM

## 2021-01-12 DIAGNOSIS — Z8616 Personal history of COVID-19: Secondary | ICD-10-CM | POA: Insufficient documentation

## 2021-01-12 DIAGNOSIS — I110 Hypertensive heart disease with heart failure: Secondary | ICD-10-CM | POA: Diagnosis not present

## 2021-01-12 DIAGNOSIS — Z87891 Personal history of nicotine dependence: Secondary | ICD-10-CM | POA: Diagnosis not present

## 2021-01-12 DIAGNOSIS — I5032 Chronic diastolic (congestive) heart failure: Secondary | ICD-10-CM | POA: Diagnosis not present

## 2021-01-12 DIAGNOSIS — R609 Edema, unspecified: Secondary | ICD-10-CM

## 2021-01-12 DIAGNOSIS — R635 Abnormal weight gain: Secondary | ICD-10-CM | POA: Diagnosis present

## 2021-01-12 DIAGNOSIS — Z20822 Contact with and (suspected) exposure to covid-19: Secondary | ICD-10-CM | POA: Diagnosis not present

## 2021-01-12 LAB — COMPREHENSIVE METABOLIC PANEL
ALT: 22 U/L (ref 0–44)
AST: 18 U/L (ref 15–41)
Albumin: 3.9 g/dL (ref 3.5–5.0)
Alkaline Phosphatase: 46 U/L (ref 38–126)
Anion gap: 11 (ref 5–15)
BUN: 12 mg/dL (ref 6–20)
CO2: 27 mmol/L (ref 22–32)
Calcium: 9.4 mg/dL (ref 8.9–10.3)
Chloride: 101 mmol/L (ref 98–111)
Creatinine, Ser: 0.97 mg/dL (ref 0.44–1.00)
GFR, Estimated: >60 mL/min (ref >60)
Glucose, Bld: 105 mg/dL — ABNORMAL HIGH (ref 70–99)
Potassium: 3.2 mmol/L — ABNORMAL LOW (ref 3.5–5.1)
Sodium: 139 mmol/L (ref 135–145)
Total Bilirubin: 0.7 mg/dL (ref 0.3–1.2)
Total Protein: 6.4 g/dL — ABNORMAL LOW (ref 6.5–8.1)

## 2021-01-12 LAB — CBC WITH DIFFERENTIAL/PLATELET
Abs Immature Granulocytes: 0.02 10*3/uL (ref 0.00–0.07)
Basophils Absolute: 0 10*3/uL (ref 0.0–0.1)
Basophils Relative: 1 %
Eosinophils Absolute: 0.1 10*3/uL (ref 0.0–0.5)
Eosinophils Relative: 1 %
HCT: 38.6 % (ref 36.0–46.0)
Hemoglobin: 13.1 g/dL (ref 12.0–15.0)
Immature Granulocytes: 0 %
Lymphocytes Relative: 17 %
Lymphs Abs: 1.1 10*3/uL (ref 0.7–4.0)
MCH: 30.2 pg (ref 26.0–34.0)
MCHC: 33.9 g/dL (ref 30.0–36.0)
MCV: 88.9 fL (ref 80.0–100.0)
Monocytes Absolute: 0.4 10*3/uL (ref 0.1–1.0)
Monocytes Relative: 7 %
Neutro Abs: 4.9 10*3/uL (ref 1.7–7.7)
Neutrophils Relative %: 74 %
Platelets: 257 10*3/uL (ref 150–400)
RBC: 4.34 MIL/uL (ref 3.87–5.11)
RDW: 13.8 % (ref 11.5–15.5)
WBC: 6.6 10*3/uL (ref 4.0–10.5)
nRBC: 0 % (ref 0.0–0.2)

## 2021-01-12 LAB — RESP PANEL BY RT-PCR (FLU A&B, COVID) ARPGX2
Influenza A by PCR: NEGATIVE
Influenza B by PCR: NEGATIVE
SARS Coronavirus 2 by RT PCR: NEGATIVE

## 2021-01-12 LAB — BRAIN NATRIURETIC PEPTIDE: B Natriuretic Peptide: 25.3 pg/mL (ref 0.0–100.0)

## 2021-01-12 MED ORDER — POTASSIUM CHLORIDE ER 20 MEQ PO TBCR: 40.0000 meq | EXTENDED_RELEASE_TABLET | Freq: Every day | ORAL | 0 refills | Status: DC
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
## 2021-01-12 NOTE — ED Triage Notes (Signed)
Pt arrives to ED with c/o of 10lbn weight gain over the past week. Pt noticed increased swelling to legs, abdomen, and face. Pt notes increased fatigue and SOB. Pt reports increased creatinine on last blood draw.

## 2021-01-12 NOTE — Discharge Instructions (Addendum)
You were seen in the emergency department today with some fluid retention.  Your lab work looks improved from yesterday.  Your potassium is slightly low and I will put you on potassium pills for the next 3 days.  Please contact your cardiology team regarding further dose adjustments to your torsemide.  Return to the emergency department any new or suddenly worsening symptoms.

## 2021-01-13 NOTE — Telephone Encounter (Signed)
Pt has been scheduled for 7/20

## 2021-01-18 ENCOUNTER — Ambulatory Visit: Payer: 59 | Attending: Internal Medicine

## 2021-01-18 DIAGNOSIS — Z20822 Contact with and (suspected) exposure to covid-19: Secondary | ICD-10-CM

## 2021-01-19 LAB — NOVEL CORONAVIRUS, NAA: SARS-CoV-2, NAA: NOT DETECTED

## 2021-01-19 LAB — SARS-COV-2, NAA 2 DAY TAT

## 2021-01-20 ENCOUNTER — Ambulatory Visit: Payer: 59 | Admitting: Internal Medicine

## 2021-01-28 ENCOUNTER — Ambulatory Visit: Payer: 59 | Admitting: Gastroenterology

## 2021-02-16 ENCOUNTER — Ambulatory Visit: Payer: 59 | Admitting: Internal Medicine

## 2021-02-16 ENCOUNTER — Encounter: Payer: Self-pay | Admitting: Internal Medicine

## 2021-02-16 ENCOUNTER — Other Ambulatory Visit: Payer: Self-pay

## 2021-02-16 VITALS — BP 116/80 | HR 92 | Temp 98.5°F | Ht 63.0 in | Wt 185.0 lb

## 2021-02-16 DIAGNOSIS — T502X5A Adverse effect of carbonic-anhydrase inhibitors, benzothiadiazides and other diuretics, initial encounter: Secondary | ICD-10-CM | POA: Diagnosis not present

## 2021-02-16 DIAGNOSIS — E038 Other specified hypothyroidism: Secondary | ICD-10-CM

## 2021-02-16 DIAGNOSIS — Z1159 Encounter for screening for other viral diseases: Secondary | ICD-10-CM | POA: Insufficient documentation

## 2021-02-16 DIAGNOSIS — E876 Hypokalemia: Secondary | ICD-10-CM | POA: Diagnosis not present

## 2021-02-16 LAB — TSH: TSH: 3.16 u[IU]/mL (ref 0.35–5.50)

## 2021-02-16 LAB — BASIC METABOLIC PANEL
BUN: 10 mg/dL (ref 6–23)
CO2: 31 mEq/L (ref 19–32)
Calcium: 9.8 mg/dL (ref 8.4–10.5)
Chloride: 96 mEq/L (ref 96–112)
Creatinine, Ser: 1.1 mg/dL (ref 0.40–1.20)
GFR: 60.23 mL/min (ref >60.00)
Glucose, Bld: 119 mg/dL — ABNORMAL HIGH (ref 70–99)
Potassium: 3 mEq/L — ABNORMAL LOW (ref 3.5–5.1)
Sodium: 138 mEq/L (ref 135–145)

## 2021-02-16 LAB — MAGNESIUM: Magnesium: 1.9 mg/dL (ref 1.5–2.5)

## 2021-02-16 MED ORDER — POTASSIUM CHLORIDE ER 20 MEQ PO TBCR: 20.0000 meq | EXTENDED_RELEASE_TABLET | Freq: Two times a day (BID) | ORAL | 0 refills | Status: DC

## 2021-02-16 NOTE — Progress Notes (Signed)
Subjective:  Patient ID: [name], female    DOB: 1976-01-18  Age: 45 y.o. MRN: [id number]  CC: Hypothyroidism  This visit occurred during the SARS-CoV-2 public health emergency.  Safety protocols were in place, including screening questions prior to the visit, additional usage of staff PPE, and extensive cleaning of exam room while observing appropriate contact time as indicated for disinfecting solutions.    HPI [name] presents for f/up -  She has felt well recently and offers no complaints.  She has intentionally lost weight.  Outpatient Medications Prior to Visit  Medication Sig Dispense Refill   aspirin EC 81 MG tablet Take 81 mg by mouth daily.     Dapsone 7.5 % GEL Apply topically daily.     metoprolol succinate (TOPROL-XL) 100 MG 24 hr tablet Take 1 tablet (100 mg total) by mouth daily. Take with or immediately following a meal. 90 tablet 3   omega-3 acid ethyl esters (LOVAZA) 1 g capsule Take 2 capsules (2 g total) by mouth 2 (two) times daily. 360 capsule 1   spironolactone (ALDACTONE) 50 MG tablet Take 1 tablet by mouth daily.     torsemide (DEMADEX) 10 MG tablet TAKE 1 TABLET(10 MG) BY MOUTH TWICE DAILY 180 tablet 3   Ubrogepant (UBRELVY) 100 MG TABS Take 1 tablet by mouth as needed. (Patient taking differently: Take 1 tablet by mouth daily as needed (migraine.).) 30 tablet 1   ondansetron (ZOFRAN ODT) 4 MG disintegrating tablet Take 1 tablet (4 mg total) by mouth every 8 (eight) hours as needed for nausea or vomiting. 20 tablet 0   clobetasol ointment (TEMOVATE) 0.05 % Apply to affected area on right hand twice daily x 3 wks then daily x 3 wk, then every other day x 3 wk. Can repeat if needed. Not to face. (Patient not taking: Reported on 02/16/2021)     chlorpheniramine-HYDROcodone (TUSSIONEX PENNKINETIC ER) 10-8 MG/5ML SUER Take 5 mLs by mouth every 12 (twelve) hours as needed for cough. 115 mL 0   Clindamycin-Benzoyl Per, Refr, gel Apply 1  application topically daily.     potassium chloride 20 MEQ TBCR Take 40 mEq by mouth daily for 3 days. 6 tablet 0   No facility-administered medications prior to visit.    ROS Review of Systems  Constitutional:  Negative for appetite change, diaphoresis and fatigue.  HENT: Negative.    Eyes: Negative.   Respiratory:  Negative for cough, chest tightness, shortness of breath and wheezing.   Cardiovascular:  Negative for chest pain, palpitations and leg swelling.  Gastrointestinal:  Negative for abdominal pain, diarrhea, nausea and vomiting.  Endocrine: Negative.   Genitourinary: Negative.  Negative for difficulty urinating and dysuria.  Musculoskeletal:  Negative for arthralgias and myalgias.  Skin: Negative.   Neurological: Negative.  Negative for dizziness, weakness and light-headedness.  Hematological:  Negative for adenopathy. Does not bruise/bleed easily.  Psychiatric/Behavioral: Negative.     Objective:  BP 116/80 (BP Location: Right Arm, Patient Position: Sitting, Cuff Size: Large)   Pulse 92   Temp 98.5 F (36.9 C) (Oral)   Ht '5\' 3"'$  (1.6 m)   Wt 185 lb (83.9 kg)   LMP 01/12/2012   SpO2 97%   BMI 32.77 kg/m   BP Readings from Last 3 Encounters:  02/16/21 116/80  01/12/21 126/88  08/24/20 108/80    Wt Readings from Last 3 Encounters:  02/16/21 185 lb (83.9 kg)  01/12/21 188 lb (85.3 kg)  08/24/20 195 lb 6.4  oz (88.6 kg)    Physical Exam Vitals reviewed.  HENT:     Mouth/Throat:     Mouth: Mucous membranes are moist.  Eyes:     Conjunctiva/sclera: Conjunctivae normal.  Cardiovascular:     Rate and Rhythm: Normal rate and regular rhythm.     Heart sounds: No murmur heard. Pulmonary:     Effort: Pulmonary effort is normal.     Breath sounds: No stridor. No wheezing, rhonchi or rales.  Abdominal:     General: Abdomen is flat. Bowel sounds are normal. There is no distension.     Palpations: Abdomen is soft. There is no hepatomegaly, splenomegaly or mass.   Musculoskeletal:        General: Normal range of motion.     Cervical back: Neck supple.     Right lower leg: No edema.     Left lower leg: No edema.  Lymphadenopathy:     Cervical: No cervical adenopathy.  Skin:    General: Skin is warm and dry.  Neurological:     General: No focal deficit present.     Mental Status: She is alert.  Psychiatric:        Mood and Affect: Mood normal.        Behavior: Behavior normal.    Lab Results  Component Value Date   WBC 6.6 01/12/2021   HGB 13.1 01/12/2021   HCT 38.6 01/12/2021   PLT 257 01/12/2021   GLUCOSE 119 (H) 02/16/2021   CHOL 237 (H) 03/05/2020   TRIG 381 (H) 03/05/2020   HDL 49 (L) 03/05/2020   LDLCALC 134 (H) 03/05/2020   ALT 22 01/12/2021   AST 18 01/12/2021   NA 138 02/16/2021   K 3.0 (L) 02/16/2021   CL 96 02/16/2021   CREATININE 1.10 02/16/2021   BUN 10 02/16/2021   CO2 31 02/16/2021   TSH 3.16 02/16/2021   INR 2.3 02/05/2019   HGBA1C 5.5 03/05/2020    DG Chest Portable 1 View  Result Date: 01/12/2021 CLINICAL DATA:  Shortness of breath EXAM: PORTABLE CHEST 1 VIEW COMPARISON:  07/17/2019 FINDINGS: Heart and mediastinal contours are within normal limits. No focal opacities or effusions. No acute bony abnormality. IMPRESSION: No active disease. Electronically Signed   By: Rolm Baptise M.D.   On: 01/12/2021 08:30    Assessment & Plan:   [name] was seen today for hypothyroidism.  Diagnoses and all orders for this visit:  Other specified hypothyroidism- She is euthyroid.  Thyroid replacement therapy is not indicated. -     TSH; Future -     TSH  Diuretic-induced hypokalemia- Her potassium level is low.  I have asked her to restart the potassium supplement and to ensure that she is taking spironolactone. -     Basic metabolic panel; Future -     Magnesium; Future -     Magnesium -     Basic metabolic panel -     Potassium Chloride ER 20 MEQ TBCR; Take 20 mEq by mouth 2 (two) times daily.  Need for  hepatitis C screening test -     Hepatitis C antibody; Future -     Hepatitis C antibody  I have discontinued Pura Spice D. Desire's Clindamycin-Benzoyl Per (Refr), chlorpheniramine-HYDROcodone, and ondansetron. I have also changed her Potassium Chloride ER. Additionally, I am having her maintain her aspirin EC, Ubrelvy, omega-3 acid ethyl esters, torsemide, metoprolol succinate, spironolactone, Dapsone, and clobetasol ointment.  Meds ordered this encounter  Medications   Potassium  Chloride ER 20 MEQ TBCR    Sig: Take 20 mEq by mouth 2 (two) times daily.    Dispense:  180 tablet    Refill:  0      Follow-up: No follow-ups on file.  [name], MD

## 2021-02-17 LAB — HEPATITIS C ANTIBODY
Hepatitis C Ab: NONREACTIVE
SIGNAL TO CUT-OFF: 0.01 (ref <1.00)

## 2021-02-22 ENCOUNTER — Telehealth: Payer: Self-pay | Admitting: Internal Medicine

## 2021-02-22 NOTE — Telephone Encounter (Signed)
Type of form received: [facility] Medical clearance, surgery date: 9.26.22 Received by: Fax  Form should be Faxed to: [phone]  Please fax back form asap
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
## 2021-02-23 ENCOUNTER — Ambulatory Visit: Payer: 59 | Admitting: Cardiology

## 2021-02-23 DIAGNOSIS — I340 Nonrheumatic mitral (valve) insufficiency: Secondary | ICD-10-CM

## 2021-02-23 DIAGNOSIS — Z01810 Encounter for preprocedural cardiovascular examination: Secondary | ICD-10-CM

## 2021-02-23 DIAGNOSIS — I10 Essential (primary) hypertension: Secondary | ICD-10-CM

## 2021-02-23 DIAGNOSIS — R0602 Shortness of breath: Secondary | ICD-10-CM

## 2021-02-23 NOTE — Telephone Encounter (Signed)
Form has been faxed.

## 2021-02-24 ENCOUNTER — Telehealth: Payer: Self-pay
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
## 2021-02-24 NOTE — Telephone Encounter (Signed)
   Name: [name]  DOB: 1975-07-12  MRN: [id number]  Primary Cardiologist: [name], MD  Chart reviewed as part of pre-operative protocol coverage. Because of Heer Mathus Quiett's past medical history and time since last visit, she will require a follow-up visit in order to better assess preoperative cardiovascular risk.  Pre-op covering staff: - Please add "pre-op clearance" to the appointment with Dr. Percival Spanish on 02/26/21 so provider is aware.  If applicable, this message will also be routed to pharmacy pool and/or primary cardiologist for input on holding anticoagulant/antiplatelet agent as requested below so that this information is available to the clearing provider at time of patient's appointment.   Kathyrn Drown, NP  02/24/2021, 2:54 PM

## 2021-02-24 NOTE — Telephone Encounter (Signed)
   [facility] Pre-operative Risk Assessment    Patient Name: [name]  DOB: 31-Oct-1975 MRN: [id number]  HEARTCARE STAFF:  - IMPORTANT!!!!!! Under Visit Info/Reason for Call, type in Other and utilize the format Clearance MM/DD/YY or Clearance TBD. Do not use dashes or single digits. - Please review there is not already an duplicate clearance open for this procedure. - If request is for dental extraction, please clarify the # of teeth to be extracted. - If the patient is currently at the dentist's office, call Pre-Op Callback Staff (MA/nurse) to input urgent request.  - If the patient is not currently in the dentist office, please route to the Pre-Op pool.  Request for surgical clearance:  What type of surgery is being performed? Laparoscopic Bilateral Salpingo Oophorectomy   When is this surgery scheduled? 03/01/21  What type of clearance is required (medical clearance vs. Pharmacy clearance to hold med vs. Both)? Cardio Clearance Are there any medications that need to be held prior to surgery and how long? NONE  Practice name and name of physician performing surgery? [facility] Ever[facility] Farrier   What is the office phone number? [phone]   7.   What is the office fax number? [phone]  8.   Anesthesia type (None, local, MAC, general) ? General   Zebedee Iba 02/24/2021, 2:24 PM  _________________________________________________________________   (provider comments below)

## 2021-02-26 ENCOUNTER — Ambulatory Visit: Payer: Managed Care, Other (non HMO) | Admitting: Cardiology

## 2021-02-26 DIAGNOSIS — I5032 Chronic diastolic (congestive) heart failure: Secondary | ICD-10-CM

## 2021-02-26 DIAGNOSIS — I10 Essential (primary) hypertension: Secondary | ICD-10-CM

## 2021-02-26 DIAGNOSIS — I340 Nonrheumatic mitral (valve) insufficiency: Secondary | ICD-10-CM

## 2021-03-01 ENCOUNTER — Other Ambulatory Visit: Payer: Self-pay | Admitting: Obstetrics and Gynecology

## 2021-04-05 ENCOUNTER — Encounter: Payer: Self-pay | Admitting: Gastroenterology

## 2021-04-13 ENCOUNTER — Telehealth (INDEPENDENT_AMBULATORY_CARE_PROVIDER_SITE_OTHER): Payer: 59 | Admitting: Family Medicine

## 2021-04-13 ENCOUNTER — Encounter: Payer: Self-pay | Admitting: Internal Medicine

## 2021-04-13 ENCOUNTER — Encounter: Payer: Self-pay | Admitting: Family Medicine

## 2021-04-13 VITALS — Temp 100.0°F

## 2021-04-13 DIAGNOSIS — R0981 Nasal congestion: Secondary | ICD-10-CM | POA: Diagnosis not present

## 2021-04-13 DIAGNOSIS — R051 Acute cough: Secondary | ICD-10-CM | POA: Diagnosis not present

## 2021-04-13 MED ORDER — PROMETHAZINE-DM 6.25-15 MG/5ML PO SYRP: 5.0000 mL | ORAL_SOLUTION | Freq: Four times a day (QID) | ORAL | 0 refills | Status: DC | PRN

## 2021-04-13 MED ORDER — BENZONATATE 100 MG PO CAPS: 100.0000 mg | ORAL_CAPSULE | Freq: Two times a day (BID) | ORAL | 0 refills | Status: DC | PRN
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
## 2021-04-13 NOTE — Patient Instructions (Addendum)
   ---------------------------------------------------------------------------------------------------------------------------      WORK SLIP:  Patient [name],  Jul 13, 1975, was seen for a medical visit today, 04/13/21 . Please excuse from work for a COVID like illness. We advise 10 days minimum from the onset of symptoms (04/12/2021) PLUS 1 day of no fever and improved symptoms. Will defer to employer for a sooner return to work 2 home tests at least 48 hours apart or a PCR test after day 2  is negative and symptoms are resolving, OR it is greater than 5 days since the positive test, symptoms are resolving and the patient can wear a high-quality, tight fitting mask such as N95 or KN95 at all times for an additional 5 days. Would also suggest COVID19 antigen testing is negative prior to return.  Sincerely: E-signature: Dr. Colin Benton, DO [place] Ph: [phone]   ------------------------------------------------------------------------------------------------------------------------------    HOME CARE TIPS:  -COVID19 testing information: ForwardDrop.[email or web address]  Most pharmacies also offer testing and home test kits. If the Covid19 test is positive and you desire antiviral treatment, please contact a [facility] or schedule a follow up virtual visit through your primary care office or through the [facility].  Other test to treat options: ConnectRV.is?[email or web address]  -I sent the medication(s) we discussed to your pharmacy: Meds ordered this encounter  Medications   benzonatate (TESSALON) 100 MG capsule    Sig: Take 1 capsule (100 mg total) by mouth 2 (two) times daily as needed for cough.    Dispense:  20 capsule    Refill:  0     -can use tylenol if needed for fevers, aches and pains per instructions  -can use nasal saline a few times per day if you have nasal congestion;  sometimes  a short course of Afrin nasal spray for 3 days can help with symptoms as well  -stay hydrated, drink plenty of fluids and eat small healthy meals - avoid dairy  -can take 1000 IU (66mcg) Vit D3 and 100-500 mg of Vit C daily per instructions  -If the Covid test is positive, check out the [facility] website for more information on home care, transmission and treatment for COVID19  -follow up with your doctor in 2-3 days unless improving and feeling better  -stay home while sick, except to seek medical care. If you have COVID19, ideally it would be best to stay home for a full 10 days since the onset of symptoms PLUS one day of no fever and feeling better. Wear a good mask that fits snugly (such as N95 or KN95) if around others to reduce the risk of transmission.  It was nice to meet you today, and I really hope you are feeling better soon. I help [facility] out with telemedicine visits on Tuesdays and Thursdays and am available for visits on those days. If you have any concerns or questions following this visit please schedule a follow up visit with your Primary Care doctor or seek care at a local urgent care clinic to avoid delays in care.    Seek in person care or schedule a follow up video visit promptly if your symptoms worsen, new concerns arise or you are not improving with treatment. Call 911 and/or seek emergency care if your symptoms are severe or life threatening.

## 2021-04-21 ENCOUNTER — Telehealth: Payer: Self-pay | Admitting: Gastroenterology

## 2021-04-21 NOTE — Telephone Encounter (Signed)
Patient called states she is having some unintentional weight loss\Abd pain and is seeking advise.

## 2021-04-22 NOTE — Telephone Encounter (Signed)
Left message for patient to call back  
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
## 2021-05-04 ENCOUNTER — Emergency Department (HOSPITAL_BASED_OUTPATIENT_CLINIC_OR_DEPARTMENT_OTHER): Payer: 59 | Admitting: Radiology

## 2021-05-04 ENCOUNTER — Encounter (HOSPITAL_BASED_OUTPATIENT_CLINIC_OR_DEPARTMENT_OTHER): Payer: Self-pay | Admitting: *Deleted

## 2021-05-04 ENCOUNTER — Emergency Department (HOSPITAL_BASED_OUTPATIENT_CLINIC_OR_DEPARTMENT_OTHER)
Admission: EM | Admit: 2021-05-04 | Discharge: 2021-05-04 | Disposition: A | Discharge status: Home / Self Care | Payer: 59 | Attending: Student | Admitting: Student

## 2021-05-04 ENCOUNTER — Other Ambulatory Visit: Payer: Self-pay

## 2021-05-04 DIAGNOSIS — L0390 Cellulitis, unspecified: Secondary | ICD-10-CM

## 2021-05-04 DIAGNOSIS — M25462 Effusion, left knee: Secondary | ICD-10-CM | POA: Insufficient documentation

## 2021-05-04 DIAGNOSIS — Z79899 Other long term (current) drug therapy: Secondary | ICD-10-CM | POA: Diagnosis not present

## 2021-05-04 DIAGNOSIS — I5032 Chronic diastolic (congestive) heart failure: Secondary | ICD-10-CM | POA: Insufficient documentation

## 2021-05-04 DIAGNOSIS — I110 Hypertensive heart disease with heart failure: Secondary | ICD-10-CM | POA: Diagnosis not present

## 2021-05-04 DIAGNOSIS — Z87891 Personal history of nicotine dependence: Secondary | ICD-10-CM | POA: Insufficient documentation

## 2021-05-04 DIAGNOSIS — Z7982 Long term (current) use of aspirin: Secondary | ICD-10-CM | POA: Diagnosis not present

## 2021-05-04 DIAGNOSIS — Z23 Encounter for immunization: Secondary | ICD-10-CM | POA: Diagnosis not present

## 2021-05-04 DIAGNOSIS — M25562 Pain in left knee: Secondary | ICD-10-CM | POA: Diagnosis present

## 2021-05-04 LAB — CBC WITH DIFFERENTIAL/PLATELET
Abs Immature Granulocytes: 0.03 10*3/uL (ref 0.00–0.07)
Basophils Absolute: 0.1 10*3/uL (ref 0.0–0.1)
Basophils Relative: 1 %
Eosinophils Absolute: 0.8 10*3/uL — ABNORMAL HIGH (ref 0.0–0.5)
Eosinophils Relative: 10 %
HCT: 40.4 % (ref 36.0–46.0)
Hemoglobin: 13.4 g/dL (ref 12.0–15.0)
Immature Granulocytes: 0 %
Lymphocytes Relative: 21 %
Lymphs Abs: 1.7 10*3/uL (ref 0.7–4.0)
MCH: 29.1 pg (ref 26.0–34.0)
MCHC: 33.2 g/dL (ref 30.0–36.0)
MCV: 87.8 fL (ref 80.0–100.0)
Monocytes Absolute: 0.5 10*3/uL (ref 0.1–1.0)
Monocytes Relative: 7 %
Neutro Abs: 5 10*3/uL (ref 1.7–7.7)
Neutrophils Relative %: 61 %
Platelets: 296 10*3/uL (ref 150–400)
RBC: 4.6 MIL/uL (ref 3.87–5.11)
RDW: 13.5 % (ref 11.5–15.5)
WBC: 8.1 10*3/uL (ref 4.0–10.5)
nRBC: 0 % (ref 0.0–0.2)

## 2021-05-04 LAB — COMPREHENSIVE METABOLIC PANEL
ALT: 16 U/L (ref 0–44)
AST: 21 U/L (ref 15–41)
Albumin: 4.2 g/dL (ref 3.5–5.0)
Alkaline Phosphatase: 87 U/L (ref 38–126)
Anion gap: 9 (ref 5–15)
BUN: 12 mg/dL (ref 6–20)
CO2: 29 mmol/L (ref 22–32)
Calcium: 9.8 mg/dL (ref 8.9–10.3)
Chloride: 99 mmol/L (ref 98–111)
Creatinine, Ser: 1.09 mg/dL — ABNORMAL HIGH (ref 0.44–1.00)
GFR, Estimated: >60 mL/min (ref >60)
Glucose, Bld: 99 mg/dL (ref 70–99)
Potassium: 4.4 mmol/L (ref 3.5–5.1)
Sodium: 137 mmol/L (ref 135–145)
Total Bilirubin: 0.6 mg/dL (ref 0.3–1.2)
Total Protein: 7.6 g/dL (ref 6.5–8.1)

## 2021-05-04 LAB — C-REACTIVE PROTEIN: CRP: 1 mg/dL — ABNORMAL HIGH (ref <1.0)

## 2021-05-04 LAB — SEDIMENTATION RATE: Sed Rate: 27 mm/hr — ABNORMAL HIGH (ref 0–22)

## 2021-05-04 MED ORDER — CEFADROXIL 500 MG PO CAPS: 500.0000 mg | ORAL_CAPSULE | Freq: Two times a day (BID) | ORAL | 0 refills | Status: AC

## 2021-05-04 MED ORDER — CEPHALEXIN 250 MG PO CAPS
500.0000 mg | ORAL_CAPSULE | Freq: Once | ORAL | Status: AC
  Administered 2021-05-04: 500 mg via ORAL
  Filled 2021-05-04: qty 2

## 2021-05-04 MED ORDER — TETANUS-DIPHTH-ACELL PERTUSSIS 5-2.5-18.5 LF-MCG/0.5 IM SUSY
0.5000 mL | PREFILLED_SYRINGE | Freq: Once | INTRAMUSCULAR | Status: AC
  Administered 2021-05-04: 0.5 mL via INTRAMUSCULAR
  Filled 2021-05-04: qty 0.5

## 2021-05-04 MED ORDER — CEFADROXIL 500 MG PO CAPS: 500.0000 mg | ORAL_CAPSULE | Freq: Once | ORAL | Status: DC
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
## 2021-05-07 ENCOUNTER — Ambulatory Visit: Payer: 59 | Admitting: Gastroenterology

## 2021-05-25 ENCOUNTER — Ambulatory Visit: Payer: 59 | Admitting: Nurse Practitioner

## 2021-05-26 ENCOUNTER — Ambulatory Visit: Payer: 59 | Admitting: Internal Medicine

## 2021-05-29 ENCOUNTER — Other Ambulatory Visit: Payer: Self-pay | Admitting: Cardiology

## 2021-05-29 DIAGNOSIS — I5032 Chronic diastolic (congestive) heart failure: Secondary | ICD-10-CM
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
## 2021-05-31 ENCOUNTER — Encounter: Payer: Self-pay | Admitting: Cardiology

## 2021-05-31 ENCOUNTER — Other Ambulatory Visit: Payer: Self-pay

## 2021-05-31 ENCOUNTER — Ambulatory Visit (INDEPENDENT_AMBULATORY_CARE_PROVIDER_SITE_OTHER): Payer: 59 | Admitting: Cardiology

## 2021-05-31 VITALS — BP 110/75 | HR 105 | Ht 63.0 in | Wt 181.4 lb

## 2021-05-31 DIAGNOSIS — Z01810 Encounter for preprocedural cardiovascular examination: Secondary | ICD-10-CM | POA: Diagnosis not present

## 2021-05-31 DIAGNOSIS — I10 Essential (primary) hypertension: Secondary | ICD-10-CM | POA: Diagnosis not present

## 2021-05-31 DIAGNOSIS — E876 Hypokalemia: Secondary | ICD-10-CM

## 2021-05-31 DIAGNOSIS — R0602 Shortness of breath: Secondary | ICD-10-CM

## 2021-05-31 DIAGNOSIS — T502X5A Adverse effect of carbonic-anhydrase inhibitors, benzothiadiazides and other diuretics, initial encounter: Secondary | ICD-10-CM

## 2021-05-31 DIAGNOSIS — Z9889 Other specified postprocedural states: Secondary | ICD-10-CM | POA: Diagnosis not present

## 2021-05-31 DIAGNOSIS — I5032 Chronic diastolic (congestive) heart failure: Secondary | ICD-10-CM

## 2021-05-31 MED ORDER — POTASSIUM CHLORIDE ER 20 MEQ PO TBCR: 20.0000 meq | EXTENDED_RELEASE_TABLET | Freq: Two times a day (BID) | ORAL | 0 refills | Status: DC

## 2021-05-31 MED ORDER — TORSEMIDE 10 MG PO TABS: ORAL_TABLET | ORAL | 3 refills | Status: DC

## 2021-05-31 NOTE — Patient Instructions (Signed)
Medication Instructions:  Your Physician recommend you continue on your current medication as directed.    *If you need a refill on your cardiac medications before your next appointment, please call your pharmacy*   Lab Work: None.   Testing/Procedures: None    Follow-Up: At [facility], you and your health needs are our priority.  As part of our continuing mission to provide you with exceptional heart care, we have created designated Provider Care Teams.  These Care Teams include your primary Cardiologist (physician) and Advanced Practice Providers (APPs -  Physician Assistants and Nurse Practitioners) who all work together to provide you with the care you need, when you need it.  We recommend signing up for the patient portal called "MyChart".  Sign up information is provided on this After Visit Summary.  MyChart is used to connect with patients for Virtual Visits (Telemedicine).  Patients are able to view lab/test results, encounter notes, upcoming appointments, etc.  Non-urgent messages can be sent to your provider as well.   To learn more about what you can do with MyChart, go to [email or web address].    Your next appointment:   12 month(s)  The format for your next appointment:   In Person  Provider:   [name], MD

## 2021-06-08 ENCOUNTER — Ambulatory Visit (INDEPENDENT_AMBULATORY_CARE_PROVIDER_SITE_OTHER): Payer: 59 | Admitting: Gastroenterology

## 2021-06-08 ENCOUNTER — Encounter: Payer: Self-pay | Admitting: Gastroenterology

## 2021-06-08 VITALS — BP 106/70 | HR 88 | Ht 63.0 in | Wt 178.0 lb

## 2021-06-08 DIAGNOSIS — R195 Other fecal abnormalities: Secondary | ICD-10-CM

## 2021-06-08 DIAGNOSIS — Z86010 Personal history of colonic polyps: Secondary | ICD-10-CM

## 2021-06-08 DIAGNOSIS — Z1509 Genetic susceptibility to other malignant neoplasm: Secondary | ICD-10-CM

## 2021-06-08 MED ORDER — NA SULFATE-K SULFATE-MG SULF 17.5-3.13-1.6 GM/177ML PO SOLN: 1.0000 | Freq: Once | ORAL | 0 refills | Status: AC
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
## 2021-06-08 NOTE — Patient Instructions (Addendum)
Start Benefiber 2 teaspoons in 8 ounces of liquid daily.  You have been scheduled for a colonoscopy. Please follow written instructions given to you at your visit today.  Please pick up your prep supplies at the pharmacy within the next 1-3 days. If you use inhalers (even only as needed), please bring them with you on the day of your procedure.  If you are age 46 or older, your body mass index should be between 23-30. Your Body mass index is 31.53 kg/m. If this is out of the aforementioned range listed, please consider follow up with your Primary Care Provider.  If you are age 3 or younger, your body mass index should be between 19-25. Your Body mass index is 31.53 kg/m. If this is out of the aformentioned range listed, please consider follow up with your Primary Care Provider.   ________________________________________________________  The [facility] providers would like to encourage you to use [facility] to communicate with providers for non-urgent requests or questions.  Due to long hold times on the telephone, sending your provider a message by [facility] may be a faster and more efficient way to get a response.  Please allow 48 business hours for a response.  Please remember that this is for non-urgent requests.  _______________________________________________________

## 2021-06-09 ENCOUNTER — Encounter: Payer: Self-pay | Admitting: Internal Medicine

## 2021-06-09 ENCOUNTER — Ambulatory Visit (INDEPENDENT_AMBULATORY_CARE_PROVIDER_SITE_OTHER): Payer: 59 | Admitting: Internal Medicine

## 2021-06-09 ENCOUNTER — Ambulatory Visit (INDEPENDENT_AMBULATORY_CARE_PROVIDER_SITE_OTHER): Payer: 59

## 2021-06-09 ENCOUNTER — Other Ambulatory Visit: Payer: Self-pay

## 2021-06-09 VITALS — BP 104/72 | HR 86 | Temp 98.6°F | Ht 63.0 in | Wt 177.0 lb

## 2021-06-09 DIAGNOSIS — R059 Cough, unspecified: Secondary | ICD-10-CM | POA: Diagnosis not present

## 2021-06-09 DIAGNOSIS — J329 Chronic sinusitis, unspecified: Secondary | ICD-10-CM

## 2021-06-09 MED ORDER — FLUCONAZOLE 150 MG PO TABS: 150.0000 mg | ORAL_TABLET | Freq: Once | ORAL | 0 refills | Status: AC

## 2021-06-09 MED ORDER — AMOXICILLIN-POT CLAVULANATE 875-125 MG PO TABS
1.0000 | ORAL_TABLET | Freq: Two times a day (BID) | ORAL | 0 refills | Status: DC
Start: 2021-06-09 — End: 2021-08-30
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
## 2021-06-09 NOTE — Patient Instructions (Signed)
  Chest xray  was ordered.     Medications changes include :   augmentin, diflucan if needed  Your prescription(s) have been submitted to your pharmacy. Please take as directed and contact our office if you believe you are having problem(s) with the medication(s).

## 2021-06-14 ENCOUNTER — Encounter: Payer: Self-pay | Admitting: Gastroenterology

## 2021-06-15 ENCOUNTER — Encounter: Payer: Self-pay | Admitting: Gastroenterology

## 2021-06-15 ENCOUNTER — Other Ambulatory Visit: Payer: Self-pay

## 2021-06-15 ENCOUNTER — Ambulatory Visit (AMBULATORY_SURGERY_CENTER): Payer: 59 | Admitting: Gastroenterology

## 2021-06-15 VITALS — BP 105/67 | HR 68 | Temp 97.1°F | Resp 19 | Ht 63.0 in | Wt 178.0 lb

## 2021-06-15 DIAGNOSIS — Z800 Family history of malignant neoplasm of digestive organs: Secondary | ICD-10-CM | POA: Diagnosis not present

## 2021-06-15 DIAGNOSIS — Z1509 Genetic susceptibility to other malignant neoplasm: Secondary | ICD-10-CM

## 2021-06-15 DIAGNOSIS — Z86010 Personal history of colonic polyps: Secondary | ICD-10-CM

## 2021-06-15 MED ORDER — SODIUM CHLORIDE 0.9 % IV SOLN: 500.0000 mL | Freq: Once | INTRAVENOUS | Status: DC

## 2021-06-15 NOTE — Op Note (Signed)
[facility] Patient Name: Briena Swingler Procedure Date: 06/15/2021 8:54 AM MRN: [id number] Endoscopist: Ladene Artist , MD Age: 45 Referring MD:  Date of Birth: [birth year] Gender: Female Account #: [id number] Procedure:                Colonoscopy Indications:              Family history of hereditary nonpolyposis                            colorectal cancer in a first-degree relative Medicines:                Monitored Anesthesia Care Procedure:                Pre-Anesthesia Assessment:                           - Prior to the procedure, a History and Physical                            was performed, and patient medications and                            allergies were reviewed. The patient's tolerance of                            previous anesthesia was also reviewed. The risks                            and benefits of the procedure and the sedation                            options and risks were discussed with the patient.                            All questions were answered, and informed consent                            was obtained. Prior Anticoagulants: The patient has                            taken no previous anticoagulant or antiplatelet                            agents. ASA Grade Assessment: II - A patient with                            mild systemic disease. After reviewing the risks                            and benefits, the patient was deemed in                            satisfactory condition to undergo the procedure.  After obtaining informed consent, the colonoscope                            was passed under direct vision. Throughout the                            procedure, the patient's blood pressure, pulse, and                            oxygen saturations were monitored continuously. The                            Colonoscope was introduced through the anus and                            advanced to the  the cecum, identified by                            appendiceal orifice and ileocecal valve. The                            ileocecal valve, appendiceal orifice, and rectum                            were photographed. The quality of the bowel                            preparation was good. The colonoscopy was performed                            without difficulty. The patient tolerated the                            procedure well. Scope In: 8:59:59 AM Scope Out: 9:12:48 AM Scope Withdrawal Time: 0 hours 8 minutes 32 seconds  Total Procedure Duration: 0 hours 12 minutes 49 seconds  Findings:                 The perianal and digital rectal examinations were                            normal.                           A few small and medium-mouthed diverticula were                            found in the left colon. There was no evidence of                            diverticular bleeding.                           Internal hemorrhoids were found during  retroflexion. The hemorrhoids were small and Grade                            I (internal hemorrhoids that do not prolapse).                           The exam was otherwise without abnormality on                            direct and retroflexion views. Complications:            No immediate complications. Estimated blood loss:                            None. Estimated Blood Loss:     Estimated blood loss: none. Impression:               - Mild diverticulosis in the left colon.                           - Internal hemorrhoids.                           - The examination was otherwise normal on direct                            and retroflexion views.                           - No specimens collected. Recommendation:           - Repeat colonoscopy in 1 year for surveillance.                           - Patient has a contact number available for                            emergencies. The signs and symptoms  of potential                            delayed complications were discussed with the                            patient. Return to normal activities tomorrow.                            Written discharge instructions were provided to the                            patient.                           - High fiber diet.                           - Continue present medications. Ladene Artist, MD 06/15/2021 9:19:05 AM This report has been signed electronically.

## 2021-06-15 NOTE — Progress Notes (Signed)
Report given to PACU, vss 

## 2021-06-15 NOTE — Progress Notes (Signed)
See 06/08/2021 H&P, no changes.

## 2021-06-15 NOTE — Patient Instructions (Signed)
Handouts given on hemorrhoids and diverticulosis.  YOU HAD AN ENDOSCOPIC PROCEDURE TODAY AT [facility]:   Refer to the procedure report that was given to you for any specific questions about what was found during the examination.  If the procedure report does not answer your questions, please call your gastroenterologist to clarify.  If you requested that your care partner not be given the details of your procedure findings, then the procedure report has been included in a sealed envelope for you to review at your convenience later.  YOU SHOULD EXPECT: Some feelings of bloating in the abdomen. Passage of more gas than usual.  Walking can help get rid of the air that was put into your GI tract during the procedure and reduce the bloating. If you had a lower endoscopy (such as a colonoscopy or flexible sigmoidoscopy) you may notice spotting of blood in your stool or on the toilet paper. If you underwent a bowel prep for your procedure, you may not have a normal bowel movement for a few days.  Please Note:  You might notice some irritation and congestion in your nose or some drainage.  This is from the oxygen used during your procedure.  There is no need for concern and it should clear up in a day or so.  SYMPTOMS TO REPORT IMMEDIATELY:  Following lower endoscopy (colonoscopy or flexible sigmoidoscopy):  Excessive amounts of blood in the stool  Significant tenderness or worsening of abdominal pains  Swelling of the abdomen that is new, acute  Fever of 100F or higher  For urgent or emergent issues, a gastroenterologist can be reached at any hour by calling [phone]. Do not use MyChart messaging for urgent concerns.    DIET:  We do recommend a small meal at first, but then you may proceed to your regular diet.  Drink plenty of fluids but you should avoid alcoholic beverages for 24 hours.  ACTIVITY:  You should plan to take it easy for the rest of today and you should NOT  DRIVE or use heavy machinery until tomorrow (because of the sedation medicines used during the test).    FOLLOW UP: Our staff will call the number listed on your records 48-72 hours following your procedure to check on you and address any questions or concerns that you may have regarding the information given to you following your procedure. If we do not reach you, we will leave a message.  We will attempt to reach you two times.  During this call, we will ask if you have developed any symptoms of COVID 19. If you develop any symptoms (ie: fever, flu-like symptoms, shortness of breath, cough etc.) before then, please call [phone].  If you test positive for Covid 19 in the 2 weeks post procedure, please call and report this information to [place].    If any biopsies were taken you will be contacted by phone or by letter within the next 1-3 weeks.  Please call us at [phone] if you have not heard about the biopsies in 3 weeks.    SIGNATURES/CONFIDENTIALITY: You and/or your care partner have signed paperwork which will be entered into your electronic medical record.  These signatures attest to the fact that that the information above on your After Visit Summary has been reviewed and is understood.  Full responsibility of the confidentiality of this discharge information lies with you and/or your care-partner.

## 2021-06-16 ENCOUNTER — Ambulatory Visit: Payer: 59 | Admitting: Gastroenterology

## 2021-06-17 ENCOUNTER — Telehealth: Payer: Self-pay | Admitting: *Deleted

## 2021-06-17 NOTE — Telephone Encounter (Signed)
°  Follow up Call-  Call back number 06/15/2021 06/01/2020 03/30/2019  Post procedure Call Back phone  # [phone]  Permission to leave phone message Yes Yes Yes  Some recent data might be hidden     Patient questions:  Do you have a fever, pain , or abdominal swelling? No. Pain Score  0 *  Have you tolerated food without any problems? Yes.    Have you been able to return to your normal activities? Yes.    Do you have any questions about your discharge instructions: Diet   No. Medications  No. Follow up visit  No.  Do you have questions or concerns about your Care? No.  Actions: * If pain score is 4 or above: No action needed, pain <4.  Have you developed a fever since your procedure? no  2.   Have you had an respiratory symptoms (SOB or cough) since your procedure? no  3.   Have you tested positive for COVID 19 since your procedure no  4.   Have you had any family members/close contacts diagnosed with the COVID 19 since your procedure?  no   If yes to any of these questions please route to Joylene John, RN and Joella Prince, RN

## 2021-06-17 NOTE — Telephone Encounter (Signed)
°  Follow up Call-  Call back number 06/15/2021 06/01/2020 03/30/2019  Post procedure Call Back phone  # [phone]  Permission to leave phone message Yes Yes Yes  Some recent data might be hidden     Patient questions:  Do you have a fever, pain , or abdominal swelling? No. Pain Score  0 *  Have you tolerated food without any problems? Yes.    Have you been able to return to your normal activities? Yes.    Do you have any questions about your discharge instructions: Diet   No. Medications  No. Follow up visit  No.  Do you have questions or concerns about your Care? No.  Actions: * If pain score is 4 or above: No action needed, pain <4.  Have you developed a fever since your procedure? no  2.   Have you had an respiratory symptoms (SOB or cough) since your procedure? no  3.   Have you tested positive for COVID 19 since your procedure no  4.   Have you had any family members/close contacts diagnosed with the COVID 19 since your procedure?  no   If yes to any of these questions please route to Joylene John, RN and Joella Prince, RN

## 2021-08-05 ENCOUNTER — Telehealth (INDEPENDENT_AMBULATORY_CARE_PROVIDER_SITE_OTHER): Payer: 59 | Admitting: Family Medicine

## 2021-08-05 DIAGNOSIS — R059 Cough, unspecified: Secondary | ICD-10-CM | POA: Diagnosis not present

## 2021-08-05 DIAGNOSIS — R0981 Nasal congestion: Secondary | ICD-10-CM

## 2021-08-05 MED ORDER — PROMETHAZINE-DM 6.25-15 MG/5ML PO SYRP: 5.0000 mL | ORAL_SOLUTION | Freq: Four times a day (QID) | ORAL | 0 refills | Status: DC | PRN
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
## 2021-08-05 NOTE — Patient Instructions (Signed)
INSTRUCTIONS FOR UPPER RESPIRATORY INFECTION:  -plenty of rest and fluids  -nasal saline wash 2-3 times daily (use prepackaged nasal saline or bottled/distilled water if making your own)   -can use AFRIN nasal spray for drainage and nasal congestion - but do NOT use longer then 3-4 days  -can use aleve as directed for aches and sorethroat  -warm salt water gargles can also help with the sore throat  -in the winter time, using a humidifier at night is helpful (please follow cleaning instructions)  -if you are taking a cough medication - use only as directed, may also try a teaspoon of honey to coat the throat and throat lozenges.   -I sent the medication(s) we discussed to your pharmacy: Meds ordered this encounter  Medications   promethazine-dextromethorphan (PROMETHAZINE-DM) 6.25-15 MG/5ML syrup    Sig: Take 5 mLs by mouth 4 (four) times daily as needed for cough.    Dispense:  118 mL    Refill:  0     I hope you are feeling better soon!  Seek in person care promptly if your symptoms worsen, new concerns arise or you are not improving with treatment.  It was nice to meet you today. I help [facility] out with telemedicine visits on Tuesdays and Thursdays and am happy to help if you need a virtual follow up visit on those days. Otherwise, if you have any concerns or questions following this visit please schedule a follow up visit with your Primary Care office or seek care at a local urgent care clinic to avoid delays in care    Upper Respiratory Infection, Adult An upper respiratory infection (URI) is also known as the common cold. It is often caused by a type of germ (virus). Colds are easily spread (contagious). You can pass it to others by kissing, coughing, sneezing, or drinking out of the same glass. Usually, you get better in 1 to 3  weeks.  However, the cough can last for even longer. HOME CARE  Only take medicine as told by your doctor. Follow instructions provided  above. Drink enough water and fluids to keep your pee (urine) clear or pale yellow. Get plenty of rest. Return to work when your temperature is < 100 for 24 hours or as told by your doctor. You may use a face mask and wash your hands to stop your cold from spreading. GET HELP RIGHT AWAY IF:  After the first few days, you feel you are getting worse. You have questions about your medicine. You have chills, shortness of breath, or red spit (mucus). You have pain in the face for more then 1-2 days, especially when you bend forward. You have a fever, puffy (swollen) neck, pain when you swallow, or white spots in the back of your throat. You have a bad headache, ear pain, sinus pain, or chest pain. You have a high-pitched whistling sound when you breathe in and out (wheezing). You cough up blood. You have sore muscles or a stiff neck. MAKE SURE YOU:  Understand these instructions. Will watch your condition. Will get help right away if you are not doing well or get worse. Document Released: 12/07/2007 Document Revised: 09/12/2011 Document Reviewed: 09/25/2013 [facility] Patient Information 2015 [place], [place]. This information is not intended to replace advice given to you by your health care provider. Make sure you discuss any questions you have with your health care provider.

## 2021-08-18 ENCOUNTER — Ambulatory Visit: Payer: 59 | Admitting: Gastroenterology

## 2021-08-30 ENCOUNTER — Encounter: Payer: Self-pay | Admitting: Internal Medicine

## 2021-08-30 ENCOUNTER — Other Ambulatory Visit: Payer: Self-pay | Admitting: Internal Medicine

## 2021-08-30 DIAGNOSIS — R0683 Snoring: Secondary | ICD-10-CM

## 2021-08-31 ENCOUNTER — Encounter: Payer: Self-pay | Admitting: Gastroenterology

## 2021-08-31 ENCOUNTER — Telehealth: Payer: Self-pay

## 2021-08-31 NOTE — Telephone Encounter (Signed)
Patient messaged in via MyChart with complaints of nausea & sharp abdominal pain intermittently throughout the day for the last 3-4 weeks. She has a history of Lynch Syndrome, and would also like to discuss potential EGD. She has been scheduled to see Estill Bamberg, [place] on Friday 09/03/21 at 1:30 pm. Pt confirmed appointment.

## 2021-09-02 NOTE — Progress Notes (Signed)
? ? ?09/03/2021 ?[name] ?[id number] ?[name] 25, 1977 ? ? ?ASSESSMENT AND PLAN:  ? ? ?Abdominal pain, epigastric ?-     CT ABDOMEN PELVIS W CONTRAST; Future ?-     CBC with Differential/Platelet; Future ?-     Comprehensive metabolic panel; Future ?-     Lipase; Future ?-     hyoscyamine (LEVSIN SL) 0.125 MG SL tablet; Place 1 tablet (0.125 mg total) under the tongue every 6 (six) hours as needed for up to 10 days for cramping. ?- Will proceed with CT with minor weight loss, lynch syndrome, check labs, if negative will proceed with EGD sooner rather than later.  ? ?Gastroesophageal reflux disease without esophagitis ?-     pantoprazole (PROTONIX) 40 MG tablet; Take 1 tablet (40 mg total) by mouth daily. ?Due EGD this year in Dec ? ?Lynch syndrome ?S/p TAH ?06/2021 Colon repeat 06/2022 ?EGD 03/30/2019 non hyplori gastritis ? ?Chronic diastolic CHF (congestive heart failure) ([place]) ?S/p MR repair ?Echo 11/05/2020 EF 60-65%, RVSP normal, normal mitral valve, aortic valve ? ?Loose stools ?Improving, continue fiber, get CT, check labs.  ?With improving labs, will not get stool samples as this is improving ? ?History of Present Illness:  ?46 y.o. female  with a past medical history of Lynch syndrome with personal history of adenomatous polyps, status post cholecystectomy 2021, status post abdominal hysterectomy, mitral valve repair 2020 and others listed below, known to Dr. Fuller Plan returns to clinic today for evaluation of nausea, abdominal discomfort for 4 weeks. ? ?06/15/2021 colonoscopy diverticula, internal hemorrhoids, otherwise unremarkable.   ?Recall 1 year due to lynch syndrome ?03/30/2019 endoscopy for screening procedure for Lynch syndrome showed nonbleeding erosions in gastric antrum otherwise normal stomach and duodenum negative for H. pylori or metaplasia. Patient is due 3 years.  ? ?Has some palpitations, fast heart beat, worse with lying down.  ?Echo 11/05/2020 EF 60-65%, RVSP normal, normal mitral  valve, aortic valve ?She has had epigastric pain since beginning of Feb.  ?Was worse after food, can now happen with liquids or random,more consistent and more random. ?She denies GERD but has throat clearing and feeling of globulus sensation.  ?Feels like a contraction, will last for several minutes and then go away. Will last for 5 mins total and can happen 2-3 x a days.  ?No radiation into her back. No melena. No fever, chills.  ?She would have nausea with flushing will last for a second.  ?She has had loose bowels, bright yellow bowels, last week was green, will have 2-3 x a day. No hematochezia.  ?She was given cough syrup Feb 2nd fur URI, no recent ABX, no supplements or other medications. Has been on estrogen patch x Nov.  ?No SOB, chest pain. On torsemide for leg swelling, no worsening leg swelling.  ?She feels better with propping herself on 2 pillows,she does snore at night.  ?Patient states she has had 3-5 lb weight loss.  ?Wt Readings from Last 5 Encounters:  ?09/03/21 179 lb 6 oz (81.4 kg)  ?06/15/21 178 lb (80.7 kg)  ?06/09/21 177 lb (80.3 kg)  ?06/08/21 178 lb (80.7 kg)  ?05/31/21 181 lb 6.4 oz (82.3 kg)  ? ? ?Current Medications:  ? ?Current Outpatient Medications (Endocrine & Metabolic):  ?  estradiol (VIVELLE-DOT) 0.1 MG/24HR patch, Place 1 patch onto the skin 2 (two) times a week. ? ?Current Outpatient Medications (Cardiovascular):  ?  torsemide (DEMADEX) 10 MG tablet, TAKE 1 TABLET(10 MG) BY MOUTH TWICE DAILY ? ? ?Current  Outpatient Medications (Analgesics):  ?  aspirin EC 81 MG tablet, Take 81 mg by mouth daily. ? ? ?Current Outpatient Medications (Other):  ?  hyoscyamine (LEVSIN SL) 0.125 MG SL tablet, Place 1 tablet (0.125 mg total) under the tongue every 6 (six) hours as needed for up to 10 days for cramping. ?  pantoprazole (PROTONIX) 40 MG tablet, Take 1 tablet (40 mg total) by mouth daily. ?  Potassium Chloride ER 20 MEQ TBCR, Take 20 mEq by mouth 2 (two) times daily. ?  valACYclovir  (VALTREX) 500 MG tablet, Take 500 mg by mouth as needed. ? ?Surgical History:  ?She  has a past surgical history that includes Tubal ligation; Wisdom tooth extraction; left knee arthroscopy; TEE without cardioversion (10/11/2011); patrial hysterectomy; Knee arthroscopy with medial menisectomy (Left, 06/07/2013); Synovectomy (Left, 06/07/2013); Abdominal hysterectomy; Colonoscopy; Polypectomy; Mitral valve repair (Right, 11/28/2018); TEE without cardioversion (N/A, 11/28/2018); and Cholecystectomy (N/A, 01/16/2020). ?Family History:  ?Her family history includes Breast cancer in her cousin and another family member; Colon cancer (age of onset: 98) in her paternal aunt; Colon cancer (age of onset: 12) in her paternal uncle; Colon cancer (age of onset: 22) in her paternal uncle; Colon cancer (age of onset: 70) in her father; Colon polyps in her maternal uncle; Multiple myeloma (age of onset: 60) in her maternal grandmother; Other in her daughter and mother; Parkinsonism in her paternal grandfather; Prostate cancer (age of onset: 84) in her maternal uncle. ?Social History:  ? reports that she quit smoking about 10 years ago. Her smoking use included cigarettes. She has a 6.00 pack-year smoking history. She has never used smokeless tobacco. She reports current alcohol use of about 18.0 standard drinks per week. She reports that she does not use drugs. ? ?Current Medications, Allergies, Past Medical History, Past Surgical History, Family History and Social History were reviewed in [employer] record. ? ?Physical Exam: ?BP 110/80 (BP Location: Left Arm, Patient Position: Sitting, Cuff Size: Normal)   Pulse 72 Comment: irregular  Ht $R'5\' 3"'Bd$  (1.6 m) Comment: height measured without shoes  Wt 179 lb 6 oz (81.4 kg)   LMP 01/12/2012   BMI 31.77 kg/m?  ?General:   Pleasant, well developed female in no acute distress ?Eyes: sclerae anicteric,conjunctive pink  ?Heart:  regular rate and rhythm ?Pulm: Clear  anteriorly; no wheezing ?Abdomen:  Soft, Obese AB, skin exam normal, Normal bowel sounds. mild tenderness in the epigastrium. Without guarding and Without rebound, without hepatomegaly. ?Extremities:  Without edema. Peripheral pulses intact.  ?Neurologic:  Alert and  oriented x4;  grossly normal neurologically. ?Skin:   Dry and intact without significant lesions or rashes. ?Psychiatric: Demonstrates good judgement and reason without abnormal affect or behaviors. ? ?Vladimir Crofts, PA-C ?09/03/21 ?

## 2021-09-03 ENCOUNTER — Ambulatory Visit: Payer: 59 | Admitting: Physician Assistant

## 2021-09-03 ENCOUNTER — Encounter: Payer: Self-pay | Admitting: Physician Assistant

## 2021-09-03 ENCOUNTER — Other Ambulatory Visit (INDEPENDENT_AMBULATORY_CARE_PROVIDER_SITE_OTHER): Payer: 59

## 2021-09-03 ENCOUNTER — Encounter: Payer: Self-pay | Admitting: Cardiology

## 2021-09-03 VITALS — BP 110/80 | HR 72 | Ht 63.0 in | Wt 179.4 lb

## 2021-09-03 DIAGNOSIS — Z1509 Genetic susceptibility to other malignant neoplasm: Secondary | ICD-10-CM

## 2021-09-03 DIAGNOSIS — I5032 Chronic diastolic (congestive) heart failure: Secondary | ICD-10-CM

## 2021-09-03 DIAGNOSIS — R1013 Epigastric pain: Secondary | ICD-10-CM

## 2021-09-03 DIAGNOSIS — K219 Gastro-esophageal reflux disease without esophagitis: Secondary | ICD-10-CM

## 2021-09-03 DIAGNOSIS — R195 Other fecal abnormalities: Secondary | ICD-10-CM

## 2021-09-03 LAB — CBC WITH DIFFERENTIAL/PLATELET
Basophils Absolute: 0.1 10*3/uL (ref 0.0–0.1)
Basophils Relative: 1.1 % (ref 0.0–3.0)
Eosinophils Absolute: 0.3 10*3/uL (ref 0.0–0.7)
Eosinophils Relative: 3.6 % (ref 0.0–5.0)
HCT: 41.9 % (ref 36.0–46.0)
Hemoglobin: 14 g/dL (ref 12.0–15.0)
Lymphocytes Relative: 15.7 % (ref 12.0–46.0)
Lymphs Abs: 1.3 10*3/uL (ref 0.7–4.0)
MCHC: 33.3 g/dL (ref 30.0–36.0)
MCV: 89.6 fl (ref 78.0–100.0)
Monocytes Absolute: 0.6 10*3/uL (ref 0.1–1.0)
Monocytes Relative: 7 % (ref 3.0–12.0)
Neutro Abs: 5.9 10*3/uL (ref 1.4–7.7)
Neutrophils Relative %: 72.6 % (ref 43.0–77.0)
Platelets: 297 10*3/uL (ref 150.0–400.0)
RBC: 4.68 Mil/uL (ref 3.87–5.11)
RDW: 13.6 % (ref 11.5–15.5)
WBC: 8.1 10*3/uL (ref 4.0–10.5)

## 2021-09-03 LAB — COMPREHENSIVE METABOLIC PANEL
ALT: 14 U/L (ref 0–35)
AST: 14 U/L (ref 0–37)
Albumin: 4.2 g/dL (ref 3.5–5.2)
Alkaline Phosphatase: 91 U/L (ref 39–117)
BUN: 10 mg/dL (ref 6–23)
CO2: 29 mEq/L (ref 19–32)
Calcium: 9.4 mg/dL (ref 8.4–10.5)
Chloride: 102 mEq/L (ref 96–112)
Creatinine, Ser: 1 mg/dL (ref 0.40–1.20)
GFR: 67.27 mL/min (ref >60.00)
Glucose, Bld: 106 mg/dL — ABNORMAL HIGH (ref 70–99)
Potassium: 3.8 mEq/L (ref 3.5–5.1)
Sodium: 139 mEq/L (ref 135–145)
Total Bilirubin: 0.4 mg/dL (ref 0.2–1.2)
Total Protein: 7.4 g/dL (ref 6.0–8.3)

## 2021-09-03 LAB — LIPASE: Lipase: 51 U/L (ref 11.0–59.0)

## 2021-09-03 MED ORDER — PANTOPRAZOLE SODIUM 40 MG PO TBEC: 40.0000 mg | DELAYED_RELEASE_TABLET | Freq: Every day | ORAL | 3 refills | Status: DC

## 2021-09-03 MED ORDER — HYOSCYAMINE SULFATE 0.125 MG SL SUBL
0.1250 mg | SUBLINGUAL_TABLET | Freq: Four times a day (QID) | SUBLINGUAL | 0 refills | Status: DC | PRN
Start: 2021-09-03 — End: 2021-11-09

## 2021-09-03 NOTE — Patient Instructions (Addendum)
You have been scheduled for a CT scan of the abdomen and pelvis at [facility] ([address]---this is in the same building as Press [facility]).   You are scheduled on 09/10/2021 at 3:00pm. You should arrive 15 minutes prior to your appointment time for registration. Please follow the written instructions below on the day of your exam:  WARNING: IF YOU ARE ALLERGIC TO IODINE/X-RAY DYE, PLEASE NOTIFY RADIOLOGY IMMEDIATELY AT [phone]! YOU WILL BE GIVEN A 13 HOUR PREMEDICATION PREP.  1) Do not eat anything after 11:00am (4 hours prior to your test) 2) You have been given 2 bottles of oral contrast to drink. The solution may taste               better if refrigerated, but do NOT add ice or any other liquid to this solution. Shake             well before drinking.    Drink 1 bottle of contrast @ 1:00pm (2 hours prior to your exam)  Drink 1 bottle of contrast @ 2:00pm (1 hour prior to your exam)  You may take any medications as prescribed with a small amount of water except for the following: Metformin, Glucophage, Glucovance, Avandamet, Riomet, Fortamet, Actoplus Met, Janumet, Glumetza or Metaglip. The above medications must be held the day of the exam AND 48 hours after the exam.  The purpose of you drinking the oral contrast is to aid in the visualization of your intestinal tract. The contrast solution may cause some diarrhea. Before your exam is started, you will be given a small amount of fluid to drink. Depending on your individual set of symptoms, you may also receive an intravenous injection of x-ray contrast/dye. Plan on being at [facility] for 30 minutes or long, depending on the type of exam you are having performed.  If you have any questions regarding your exam or if you need to reschedule, you may call the CT department at [phone] between the hours of 8:00 am and 5:00 pm,  Monday-Friday.  ________________________________________________________________________   Please take your proton pump inhibitor medication 30 minutes to 1 hour before meals - this makes it more effective.  Avoid spicy and acidic foods Avoid fatty foods Limit your intake of coffee, tea, alcohol, and carbonated drinks Work to maintain a healthy weight Keep the head of the bed elevated at least 3 inches with blocks or a wedge pillow if you are having any nighttime symptoms Stay upright for 2 hours after eating Avoid meals and snacks three to four hours before bedtime  FIBER SUPPLEMENT  Benefiber or Citracel is good for constipation/diarrhea/irritable bowel syndrome, it helps with weight loss and can help lower your bad cholesterol. Please do 1 TBSP in the morning in water, coffee, or tea. It can take up to a month before you can see a difference with your bowel movements. It is cheapest from [employer], [employer], [employer].    Gastroesophageal Reflux Disease, Adult Gastroesophageal reflux (GER) happens when acid from the stomach flows up into the tube that connects the mouth and the stomach (esophagus). Normally, food travels down the esophagus and stays in the stomach to be digested. However, when a person has GER, food and stomach acid sometimes move back up into the esophagus. If this becomes a more serious problem, the person may be diagnosed with a disease called gastroesophageal reflux disease (GERD). GERD occurs when the reflux: Happens often. Causes frequent or severe symptoms. Causes problems such as damage  to the esophagus. When stomach acid comes in contact with the esophagus, the acid may cause inflammation in the esophagus. Over time, GERD may create small holes (ulcers) in the lining of the esophagus. What are the causes? This condition is caused by a problem with the muscle between the esophagus and the stomach (lower esophageal sphincter, or LES). Normally, the LES muscle closes  after food passes through the esophagus to the stomach. When the LES is weakened or abnormal, it does not close properly, and that allows food and stomach acid to go back up into the esophagus. The LES can be weakened by certain dietary substances, medicines, and medical conditions, including: Tobacco use. Pregnancy. Having a hiatal hernia. Alcohol use. Certain foods and beverages, such as coffee, chocolate, onions, and peppermint. What increases the risk? You are more likely to develop this condition if you: Have an increased body weight. Have a connective tissue disorder. Take NSAIDs, such as ibuprofen. What are the signs or symptoms? Symptoms of this condition include: Heartburn. Difficult or painful swallowing and the feeling of having a lump in the throat. A bitter taste in the mouth. Bad breath and having a large amount of saliva. Having an upset or bloated stomach and belching. Chest pain. Different conditions can cause chest pain. Make sure you see your health care provider if you experience chest pain. Shortness of breath or wheezing. Ongoing (chronic) cough or a nighttime cough. Wearing away of tooth enamel. Weight loss. How is this diagnosed? This condition may be diagnosed based on a medical history and a physical exam. To determine if you have mild or severe GERD, your health care provider may also monitor how you respond to treatment. You may also have tests, including: A test to examine your stomach and esophagus with a small camera (endoscopy). A test that measures the acidity level in your esophagus. A test that measures how much pressure is on your esophagus. A barium swallow or modified barium swallow test to show the shape, size, and functioning of your esophagus. How is this treated? Treatment for this condition may vary depending on how severe your symptoms are. Your health care provider may recommend: Changes to your diet. Medicine. Surgery. The goal of  treatment is to help relieve your symptoms and to prevent complications. Follow these instructions at home: Eating and drinking  Follow a diet as recommended by your health care provider. This may involve avoiding foods and drinks such as: Coffee and tea, with or without caffeine. Drinks that contain alcohol. Energy drinks and sports drinks. Carbonated drinks or sodas. Chocolate and cocoa. Peppermint and mint flavorings. Garlic and onions. Horseradish. Spicy and acidic foods, including peppers, chili powder, curry powder, vinegar, hot sauces, and barbecue sauce. Citrus fruit juices and citrus fruits, such as oranges, lemons, and limes. Tomato-based foods, such as red sauce, chili, salsa, and pizza with red sauce. Fried and fatty foods, such as donuts, french fries, potato chips, and high-fat dressings. High-fat meats, such as hot dogs and fatty cuts of red and white meats, such as rib eye steak, sausage, ham, and bacon. High-fat dairy items, such as whole milk, butter, and cream cheese. Eat small, frequent meals instead of large meals. Avoid drinking large amounts of liquid with your meals. Avoid eating meals during the 2-3 hours before bedtime. Avoid lying down right after you eat. Do not exercise right after you eat. Lifestyle  Do not use any products that contain nicotine or tobacco. These products include cigarettes, chewing tobacco, and  vaping devices, such as e-cigarettes. If you need help quitting, ask your health care provider. Try to reduce your stress by using methods such as yoga or meditation. If you need help reducing stress, ask your health care provider. If you are overweight, reduce your weight to an amount that is healthy for you. Ask your health care provider for guidance about a safe weight loss goal. General instructions Pay attention to any changes in your symptoms. Take over-the-counter and prescription medicines only as told by your health care provider. Do not  take aspirin, ibuprofen, or other NSAIDs unless your health care provider told you to take these medicines. Wear loose-fitting clothing. Do not wear anything tight around your waist that causes pressure on your abdomen. Raise (elevate) the head of your bed about 6 inches (15 cm). You can use a wedge to do this. Avoid bending over if this makes your symptoms worse. Keep all follow-up visits. This is important. Contact a health care provider if: You have: New symptoms. Unexplained weight loss. Difficulty swallowing or it hurts to swallow. Wheezing or a persistent cough. A hoarse voice. Your symptoms do not improve with treatment. Get help right away if: You have sudden pain in your arms, neck, jaw, teeth, or back. You suddenly feel sweaty, dizzy, or light-headed. You have chest pain or shortness of breath. You vomit and the vomit is green, yellow, or black, or it looks like blood or coffee grounds. You faint. You have stool that is red, bloody, or black. You cannot swallow, drink, or eat. These symptoms may represent a serious problem that is an emergency. Do not wait to see if the symptoms will go away. Get medical help right away. Call your local emergency services (911 in the [place] Do not drive yourself to the hospital. Summary Gastroesophageal reflux happens when acid from the stomach flows up into the esophagus. GERD is a disease in which the reflux happens often, causes frequent or severe symptoms, or causes problems such as damage to the esophagus. Treatment for this condition may vary depending on how severe your symptoms are. Your health care provider may recommend diet and lifestyle changes, medicine, or surgery. Contact a health care provider if you have new or worsening symptoms. Take over-the-counter and prescription medicines only as told by your health care provider. Do not take aspirin, ibuprofen, or other NSAIDs unless your health care provider told you to do so. Keep all  follow-up visits as told by your health care provider. This is important. This information is not intended to replace advice given to you by your health care provider. Make sure you discuss any questions you have with your health care provider. Document Revised: 12/30/2019 Document Reviewed: 12/30/2019 Elsevier Patient Education  [place].

## 2021-09-10 ENCOUNTER — Other Ambulatory Visit: Payer: Self-pay

## 2021-09-10 ENCOUNTER — Ambulatory Visit (INDEPENDENT_AMBULATORY_CARE_PROVIDER_SITE_OTHER)
Admission: RE | Admit: 2021-09-10 | Discharge: 2021-09-10 | Disposition: A | Discharge status: Home / Self Care | Payer: 59 | Source: Ambulatory Visit | Attending: Physician Assistant | Admitting: Physician Assistant

## 2021-09-10 DIAGNOSIS — R1013 Epigastric pain: Secondary | ICD-10-CM

## 2021-09-10 MED ORDER — IOHEXOL 300 MG/ML  SOLN
100.0000 mL | Freq: Once | INTRAMUSCULAR | Status: AC | PRN
  Administered 2021-09-10: 100 mL via INTRAVENOUS

## 2021-09-13 ENCOUNTER — Other Ambulatory Visit: Payer: Self-pay

## 2021-09-13 ENCOUNTER — Encounter: Payer: Self-pay | Admitting: Gastroenterology

## 2021-09-13 DIAGNOSIS — R195 Other fecal abnormalities: Secondary | ICD-10-CM

## 2021-09-13 DIAGNOSIS — K529 Noninfective gastroenteritis and colitis, unspecified: Secondary | ICD-10-CM
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
## 2021-10-01 ENCOUNTER — Ambulatory Visit: Payer: 59 | Admitting: Cardiology

## 2021-10-01 DIAGNOSIS — R000 Tachycardia, unspecified: Secondary | ICD-10-CM

## 2021-10-01 DIAGNOSIS — I429 Cardiomyopathy, unspecified: Secondary | ICD-10-CM

## 2021-10-01 DIAGNOSIS — Z9889 Other specified postprocedural states: Secondary | ICD-10-CM

## 2021-10-01 DIAGNOSIS — I10 Essential (primary) hypertension: Secondary | ICD-10-CM

## 2021-10-04 ENCOUNTER — Telehealth: Payer: Self-pay

## 2021-10-04 NOTE — Telephone Encounter (Signed)
Left message for patient to call back. Calling in regards to EGD scheduling. ?

## 2021-10-05 ENCOUNTER — Encounter: Payer: Self-pay | Admitting: Cardiology

## 2021-10-05 ENCOUNTER — Other Ambulatory Visit: Payer: Self-pay

## 2021-10-05 ENCOUNTER — Telehealth: Payer: Self-pay

## 2021-10-05 DIAGNOSIS — K219 Gastro-esophageal reflux disease without esophagitis: Secondary | ICD-10-CM

## 2021-10-05 DIAGNOSIS — R1013 Epigastric pain: Secondary | ICD-10-CM

## 2021-10-05 DIAGNOSIS — Z1509 Genetic susceptibility to other malignant neoplasm: Secondary | ICD-10-CM

## 2021-10-05 NOTE — Telephone Encounter (Signed)
Called and confirmed with patient for EGD date. She is scheduled for 4/17 at 8:30 in [facility] with Dr. Fuller Plan. Instructions have been sent via my chart & to home address.  ?

## 2021-10-18 ENCOUNTER — Encounter: Payer: Self-pay | Admitting: Gastroenterology

## 2021-10-18 ENCOUNTER — Ambulatory Visit (AMBULATORY_SURGERY_CENTER): Payer: 59 | Admitting: Gastroenterology

## 2021-10-18 VITALS — BP 111/81 | HR 80 | Temp 97.3°F | Resp 18 | Ht 63.0 in | Wt 179.0 lb

## 2021-10-18 DIAGNOSIS — Z148 Genetic carrier of other disease: Secondary | ICD-10-CM

## 2021-10-18 DIAGNOSIS — Z1509 Genetic susceptibility to other malignant neoplasm: Secondary | ICD-10-CM | POA: Diagnosis not present

## 2021-10-18 DIAGNOSIS — R1013 Epigastric pain: Secondary | ICD-10-CM

## 2021-10-18 DIAGNOSIS — K219 Gastro-esophageal reflux disease without esophagitis: Secondary | ICD-10-CM

## 2021-10-18 MED ORDER — DICYCLOMINE HCL 10 MG PO CAPS: 10.0000 mg | ORAL_CAPSULE | Freq: Three times a day (TID) | ORAL | 11 refills | Status: DC | PRN

## 2021-10-18 MED ORDER — SODIUM CHLORIDE 0.9 % IV SOLN: 500.0000 mL | INTRAVENOUS | Status: DC

## 2021-10-18 NOTE — Progress Notes (Signed)
Report to PACU, RN, vss, BBS= Clear.  

## 2021-10-18 NOTE — Patient Instructions (Signed)
Please read handouts provide. ?Continue present medications. ?Dicyclomine 10 mg three times daily as needed for abdominal pain. ?Repeat upper endoscopy in 3 years for screening. ?Follow antireflux measures. ? ? ?YOU HAD AN ENDOSCOPIC PROCEDURE TODAY AT [facility]:   Refer to the procedure report that was given to you for any specific questions about what was found during the examination.  If the procedure report does not answer your questions, please call your gastroenterologist to clarify.  If you requested that your care partner not be given the details of your procedure findings, then the procedure report has been included in a sealed envelope for you to review at your convenience later. ? ?YOU SHOULD EXPECT: Some feelings of bloating in the abdomen. Passage of more gas than usual.  Walking can help get rid of the air that was put into your GI tract during the procedure and reduce the bloating. If you had a lower endoscopy (such as a colonoscopy or flexible sigmoidoscopy) you may notice spotting of blood in your stool or on the toilet paper. If you underwent a bowel prep for your procedure, you may not have a normal bowel movement for a few days. ? ?Please Note:  You might notice some irritation and congestion in your nose or some drainage.  This is from the oxygen used during your procedure.  There is no need for concern and it should clear up in a day or so. ? ?SYMPTOMS TO REPORT IMMEDIATELY: ? ? ?Following upper endoscopy (EGD) ? Vomiting of blood or coffee ground material ? New chest pain or pain under the shoulder blades ? Painful or persistently difficult swallowing ? New shortness of breath ? Fever of 100?F or higher ? Black, tarry-looking stools ? ?For urgent or emergent issues, a gastroenterologist can be reached at any hour by calling [phone]. ?Do not use MyChart messaging for urgent concerns.  ? ? ?DIET:  We do recommend a small meal at first, but then you may proceed to your  regular diet.  Drink plenty of fluids but you should avoid alcoholic beverages for 24 hours. ? ?ACTIVITY:  You should plan to take it easy for the rest of today and you should NOT DRIVE or use heavy machinery until tomorrow (because of the sedation medicines used during the test).   ? ?FOLLOW UP: ?Our staff will call the number listed on your records 48-72 hours following your procedure to check on you and address any questions or concerns that you may have regarding the information given to you following your procedure. If we do not reach you, we will leave a message.  We will attempt to reach you two times.  During this call, we will ask if you have developed any symptoms of COVID 19. If you develop any symptoms (ie: fever, flu-like symptoms, shortness of breath, cough etc.) before then, please call [phone].  If you test positive for Covid 19 in the 2 weeks post procedure, please call and report this information to [place].   ? ?If any biopsies were taken you will be contacted by phone or by letter within the next 1-3 weeks.  Please call us at [phone] if you have not heard about the biopsies in 3 weeks.  ? ? ?SIGNATURES/CONFIDENTIALITY: ?You and/or your care partner have signed paperwork which will be entered into your electronic medical record.  These signatures attest to the fact that that the information above on your After Visit Summary has been reviewed and is  understood.  Full responsibility of the confidentiality of this discharge information lies with you and/or your care-partner.  ?

## 2021-10-18 NOTE — Op Note (Signed)
[place] ?Patient Name: [name] ?Procedure Date: 10/18/2021 8:31 AM ?MRN: [id number] ?Endoscopist: Ladene Artist , MD ?Age: 46 ?Referring MD:  ?Date of Birth: [birth year] ?Gender: Female ?Account #: [id number] ?Procedure:                Upper GI endoscopy ?Indications:              Epigastric abdominal pain, Gastroesophageal reflux  ?                          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                          - Prior to the procedure, a History and Physical  ?                          was performed, and patient medications and  ?                          allergies were reviewed. The patient's tolerance of  ?                          previous anesthesia was also reviewed. The risks  ?                          and benefits of the procedure and the sedation  ?                          options and risks were discussed with the patient.  ?                          All questions were answered, and informed consent  ?                          was obtained. Prior Anticoagulants: The patient has  ?                          taken no previous anticoagulant or antiplatelet  ?                          agents. ASA Grade Assessment: II - A patient with  ?                          mild systemic disease. After reviewing the risks  ?                          and benefits, the patient was deemed in  ?                          satisfactory condition to undergo the procedure. ?  After obtaining informed consent, the endoscope was  ?                          passed under direct vision. Throughout the  ?                          procedure, the patient's blood pressure, pulse, and  ?                          oxygen saturations were monitored continuously. The  ?                          GIF [id number] #[id number] was introduced through the  ?                          mouth,  and advanced to the second part of duodenum.  ?                          The upper GI endoscopy was accomplished without  ?                          difficulty. The patient tolerated the procedure  ?                          well. ?Scope In: ?Scope Out: ?Findings:                 The esophagus was normal. ?                          The stomach was normal. ?                          The examined duodenum was normal including view of  ?                          the major papilla. ?                          The cardia and gastric fundus were normal on  ?                          retroflexion. ?Complications:            No immediate complications. ?Estimated Blood Loss:     Estimated blood loss: none. ?Impression:               - Normal esophagus. ?                          - Normal stomach. ?                          - Normal examined duodenum. ?                          - No specimens collected. ?Recommendation:           - Patient  has a contact number available for  ?                          emergencies. The signs and symptoms of potential  ?                          delayed complications were discussed with the  ?                          patient. Return to normal activities tomorrow.  ?                          Written discharge instructions were provided to the  ?                          patient. ?                          - Resume previous diet. ?                          - Follow antireflux measures. ?                          - Continue present medications. ?                          - Dicyclomine 10 mg po tid prn abdominal pain, 1  ?                          year of refills. ?                          - Repeat upper endoscopy in 3 years for screening  ?                          purposes. ?Ladene Artist, MD ?10/18/2021 8:49:48 AM ?This report has been signed electronically. ?
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
## 2021-10-20 ENCOUNTER — Telehealth: Payer: Self-pay | Admitting: *Deleted

## 2021-10-20 NOTE — Telephone Encounter (Signed)
?  Follow up Call- ? ? ?  10/18/2021  ?  8:04 AM 06/15/2021  ?  8:17 AM 06/01/2020  ?  7:20 AM 03/30/2019  ? 10:23 AM  ?Call back number  ?Post procedure Call Back phone  # [phone]  ?Permission to leave phone message Yes Yes Yes Yes  ?  ? ?Patient questions: ? ?Do you have a fever, pain , or abdominal swelling? No. ?Pain Score  0 * ? ?Have you tolerated food without any problems? Yes.   ? ?Have you been able to return to your normal activities? Yes.   ? ?Do you have any questions about your discharge instructions: ?Diet   No. ?Medications  No. ?Follow up visit  No. ? ?Do you have questions or concerns about your Care? No. ? ?Actions: ?* If pain score is 4 or above: ?No action needed, pain <4. ? ? ?

## 2021-10-26 ENCOUNTER — Encounter: Payer: Self-pay | Admitting: Internal Medicine

## 2021-10-27 ENCOUNTER — Encounter: Payer: Self-pay | Admitting: Family Medicine

## 2021-10-27 ENCOUNTER — Telehealth (INDEPENDENT_AMBULATORY_CARE_PROVIDER_SITE_OTHER): Payer: 59 | Admitting: Family Medicine

## 2021-10-27 VITALS — Temp 100.1°F

## 2021-10-27 DIAGNOSIS — R0981 Nasal congestion: Secondary | ICD-10-CM

## 2021-10-27 DIAGNOSIS — J3489 Other specified disorders of nose and nasal sinuses: Secondary | ICD-10-CM | POA: Diagnosis not present

## 2021-10-27 DIAGNOSIS — R051 Acute cough: Secondary | ICD-10-CM

## 2021-10-27 DIAGNOSIS — U071 COVID-19: Secondary | ICD-10-CM

## 2021-10-27 MED ORDER — MOLNUPIRAVIR 200 MG PO CAPS: 4.0000 | ORAL_CAPSULE | Freq: Two times a day (BID) | ORAL | 0 refills | Status: AC

## 2021-10-27 MED ORDER — PROMETHAZINE-DM 6.25-15 MG/5ML PO SYRP: 5.0000 mL | ORAL_SOLUTION | Freq: Four times a day (QID) | ORAL | 0 refills | Status: DC | PRN

## 2021-10-27 MED ORDER — BENZONATATE 200 MG PO CAPS: 200.0000 mg | ORAL_CAPSULE | Freq: Two times a day (BID) | ORAL | 0 refills | Status: DC | PRN
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
## 2021-11-09 ENCOUNTER — Encounter: Payer: Self-pay | Admitting: Family Medicine

## 2021-11-09 ENCOUNTER — Telehealth (INDEPENDENT_AMBULATORY_CARE_PROVIDER_SITE_OTHER): Payer: 59 | Admitting: Family Medicine

## 2021-11-09 ENCOUNTER — Encounter: Payer: Self-pay | Admitting: Internal Medicine

## 2021-11-09 VITALS — Temp 97.4°F

## 2021-11-09 DIAGNOSIS — R059 Cough, unspecified: Secondary | ICD-10-CM

## 2021-11-09 DIAGNOSIS — R0981 Nasal congestion: Secondary | ICD-10-CM | POA: Diagnosis not present

## 2021-11-09 DIAGNOSIS — R0982 Postnasal drip: Secondary | ICD-10-CM | POA: Diagnosis not present

## 2021-11-09 MED ORDER — AMOXICILLIN-POT CLAVULANATE 875-125 MG PO TABS: 1.0000 | ORAL_TABLET | Freq: Two times a day (BID) | ORAL | 0 refills | Status: DC

## 2021-11-09 MED ORDER — PROMETHAZINE-DM 6.25-15 MG/5ML PO SYRP: 5.0000 mL | ORAL_SOLUTION | Freq: Two times a day (BID) | ORAL | 0 refills | Status: DC | PRN

## 2021-11-09 NOTE — Patient Instructions (Signed)
? ? ? ?--------------------------------------------------------------------------------------------------------------------------- ? ? ? ?  WORK SLIP: ? ?Patient [name],  Jan 25, 1976, was seen for a medical visit today, 11/09/21 . Please excuse from work for a COVID/flu like illness. If Covid19 testing is positive, patient should re-isolate and stay home for 5 additional days as she will likely be contagious. If testing is negative she may return to work. ? ?Sincerely: ?E-signature: Dr. Colin Benton, DO ?[facility] ?Ph: [phone] ? ? ?------------------------------------------------------------------------------------------------------------------------------ ? ? ?-I sent the medication(s) we discussed to your pharmacy: ?Meds ordered this encounter  ?Medications  ? amoxicillin-clavulanate (AUGMENTIN) 875-125 MG tablet  ?  Sig: Take 1 tablet by mouth 2 (two) times daily.  ?  Dispense:  20 tablet  ?  Refill:  0  ? promethazine-dextromethorphan (PROMETHAZINE-DM) 6.25-15 MG/5ML syrup  ?  Sig: Take 5 mLs by mouth 2 (two) times daily as needed for cough.  ?  Dispense:  118 mL  ?  Refill:  0  ? ? ? ?I hope you are feeling better soon! ? ?Seek in person care promptly if your symptoms worsen, new concerns arise or you are not improving with treatment. ? ?It was nice to meet you today. I help [facility] out with telemedicine visits on Tuesdays and Thursdays and am happy to help if you need a virtual follow up visit on those days. Otherwise, if you have any concerns or questions following this visit please schedule a follow up visit with your Primary Care office or seek care at a local urgent care clinic to avoid delays in care. If you are having severe or life threatening symptoms please call 911 and/or go to the nearest emergency room.  ? ?
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
## 2021-12-24 ENCOUNTER — Ambulatory Visit (INDEPENDENT_AMBULATORY_CARE_PROVIDER_SITE_OTHER): Payer: 59

## 2021-12-24 ENCOUNTER — Ambulatory Visit (INDEPENDENT_AMBULATORY_CARE_PROVIDER_SITE_OTHER): Payer: 59 | Admitting: Nurse Practitioner

## 2021-12-24 VITALS — BP 120/72 | HR 100 | Temp 98.4°F | Ht 63.0 in | Wt 186.0 lb

## 2021-12-24 DIAGNOSIS — R052 Subacute cough: Secondary | ICD-10-CM | POA: Diagnosis not present

## 2021-12-24 DIAGNOSIS — R21 Rash and other nonspecific skin eruption: Secondary | ICD-10-CM | POA: Insufficient documentation

## 2021-12-24 MED ORDER — DOXYCYCLINE HYCLATE 100 MG PO TABS: 100.0000 mg | ORAL_TABLET | Freq: Two times a day (BID) | ORAL | 0 refills | Status: DC

## 2021-12-24 MED ORDER — CLOTRIMAZOLE-BETAMETHASONE 1-0.05 % EX CREA: 1.0000 | TOPICAL_CREAM | Freq: Every day | CUTANEOUS | 0 refills | Status: DC

## 2021-12-24 MED ORDER — BENZONATATE 200 MG PO CAPS: 200.0000 mg | ORAL_CAPSULE | Freq: Two times a day (BID) | ORAL | 0 refills | Status: DC | PRN

## 2022-01-13 ENCOUNTER — Ambulatory Visit: Payer: 59 | Admitting: Internal Medicine

## 2022-01-20 ENCOUNTER — Ambulatory Visit: Payer: 59 | Admitting: Internal Medicine

## 2022-04-18 ENCOUNTER — Ambulatory Visit (HOSPITAL_BASED_OUTPATIENT_CLINIC_OR_DEPARTMENT_OTHER): Payer: 59 | Admitting: Orthopaedic Surgery

## 2022-04-26 ENCOUNTER — Encounter: Payer: Self-pay | Admitting: Gastroenterology

## 2022-05-17 ENCOUNTER — Ambulatory Visit (HOSPITAL_COMMUNITY)
Admission: RE | Admit: 2022-05-17 | Discharge: 2022-05-17 | Disposition: A | Discharge status: Home / Self Care | Payer: 59 | Source: Ambulatory Visit | Attending: Orthopaedic Surgery | Admitting: Orthopaedic Surgery

## 2022-05-17 ENCOUNTER — Ambulatory Visit (INDEPENDENT_AMBULATORY_CARE_PROVIDER_SITE_OTHER): Payer: 59

## 2022-05-17 ENCOUNTER — Ambulatory Visit (INDEPENDENT_AMBULATORY_CARE_PROVIDER_SITE_OTHER): Payer: 59 | Admitting: Orthopaedic Surgery

## 2022-05-17 DIAGNOSIS — M1712 Unilateral primary osteoarthritis, left knee: Secondary | ICD-10-CM | POA: Diagnosis present

## 2022-05-17 DIAGNOSIS — M79661 Pain in right lower leg: Secondary | ICD-10-CM

## 2022-05-17 DIAGNOSIS — M1711 Unilateral primary osteoarthritis, right knee: Secondary | ICD-10-CM

## 2022-05-17 MED ORDER — TRAMADOL HCL 50 MG PO TABS: 50.0000 mg | ORAL_TABLET | Freq: Two times a day (BID) | ORAL | 0 refills | Status: DC | PRN
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
## 2022-05-18 ENCOUNTER — Other Ambulatory Visit: Payer: Self-pay | Admitting: Physician Assistant

## 2022-05-18 ENCOUNTER — Encounter: Payer: Self-pay | Admitting: Orthopaedic Surgery

## 2022-05-18 MED ORDER — HYDROCODONE-ACETAMINOPHEN 5-325 MG PO TABS: 1.0000 | ORAL_TABLET | Freq: Two times a day (BID) | ORAL | 0 refills | Status: DC | PRN

## 2022-05-18 NOTE — Telephone Encounter (Signed)
See message.

## 2022-05-29 ENCOUNTER — Encounter: Payer: Self-pay | Admitting: Cardiology

## 2022-05-29 NOTE — Progress Notes (Signed)
Cardiology Office Note   Date:  05/31/2022   ID:  [name], DOB [birth year], MRN [id number]  PCP:  [name], MD  Cardiologist:   [name], MD   Chief Complaint  Patient presents with   Shortness of Breath     History of Present Illness: [name] is a 46 y.o. female who presents for follow up of mitral valve prolapse and mitral regurgitation noted in 2013 with bileaflet prolapse.  The patient had a minimally invasive mitral valve repair using a Memo 4D ring size 32 (right chest) by Dr. Halford Chessman on 11/28/2018.    Since I last saw her she had some increased shortness of breath occasionally.  She might lie down and get short of breath but she is not describing classic PND or orthopnea.  She does feel like she retains fluid and she can feel with abdominal swelling.   She takes 20 mg of torsemide every day and occasionally takes an extra 10 mg.  She does not take her potassium every day.  She does try to watch her salt.  She does drink fluids because she is just thirsty.  Her brother died earlier this year and she had to deal with this.  She works as a Engineer, site.     Past Medical History:  Diagnosis Date   Adenomatous colon polyp 12/2012   Allergic rhinitis    Cellulitis    Common migraine    Diastolic dysfunction    Dysrhythmia    External hemorrhoid    Genital herpes    GERD (gastroesophageal reflux disease)    Hx of migraines    Hyperlipidemia    Hypertension    Lynch syndrome    Mitral regurgitation    MVP (mitral valve prolapse)    Plica syndrome of left knee    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   32 mm Sorin Memo 4D ring annuloplasty via right mini thoracotomy approach    Past Surgical History:  Procedure Laterality Date   ABDOMINAL HYSTERECTOMY     CHOLECYSTECTOMY N/A 01/16/2020   Procedure: LAPAROSCOPIC CHOLECYSTECTOMY WITH INTRAOPERATIVE CHOLANGIOGRAM;  Surgeon: Armandina Gemma, MD;   [facility];  Service: General;  Laterality: N/A;   COLONOSCOPY     KNEE ARTHROSCOPY WITH MEDIAL MENISECTOMY Left 06/07/2013   Procedure: LEFT KNEE ARTHROSCOPY WITH SYNOVECTOMY LIMITED, ARTHROSCOPY KNEE WITH DEBRIDEMENT/SHAVING (CHONDROPLASTY), ARTHROSCOPY KNEE  ChondroplastyWITH MEDIAL AND LATERAL  MENISECTOMY;  Surgeon: Renette Butters, MD;  Location: [place];  Service: Orthopedics;  Laterality: Left;   left knee arthroscopy     MITRAL VALVE REPAIR Right 11/28/2018   Procedure: MINIMALLY INVASIVE MITRAL VALVE REPAIR (MVR) - Using Memo 4D Ring Size 32;  Surgeon: [name], MD;  Location: [place];  Service: Open Heart Surgery;  Laterality: Right;   patrial hysterectomy     POLYPECTOMY     SYNOVECTOMY Left 06/07/2013   Procedure: SYNOVECTOMY;  Surgeon: Renette Butters, MD;  Location: [place];  Service: Orthopedics;  Laterality: Left;   TEE WITHOUT CARDIOVERSION  10/11/2011   Procedure: TRANSESOPHAGEAL ECHOCARDIOGRAM (TEE);  Surgeon: Josue Hector, MD;  Location: [facility];  Service: Cardiovascular;  Laterality: N/A;   TEE WITHOUT CARDIOVERSION N/A 11/28/2018   Procedure: TRANSESOPHAGEAL ECHOCARDIOGRAM (TEE);  Surgeon: [name], MD;  Location: [place];  Service: Open Heart Surgery;  Laterality: N/A;   TUBAL LIGATION     WISDOM TOOTH EXTRACTION  Current Outpatient Medications  Medication Sig Dispense Refill   estradiol (VIVELLE-DOT) 0.1 MG/24HR patch Place 1 patch onto the skin 2 (two) times a week.     HYDROcodone-acetaminophen (NORCO) 5-325 MG tablet Take 1 tablet by mouth 2 (two) times daily as needed. 10 tablet 0   Potassium Chloride ER 20 MEQ TBCR Take 20 mEq by mouth 2 (two) times daily. 180 tablet 0   traMADol (ULTRAM) 50 MG tablet Take 1 tablet (50 mg total) by mouth every 12 (twelve) hours as needed. 30 tablet 0   valACYclovir (VALTREX) 500 MG tablet Take 500 mg by mouth as needed.     torsemide (DEMADEX) 10 MG tablet Take 3  tablets (30 mg total) by mouth daily. 270 tablet 3   No current facility-administered medications for this visit.    Allergies:   Bee venom    ROS:  Please see the history of present illness.   Otherwise, review of systems are positive for none.   All other systems are reviewed and negative.    PHYSICAL EXAM: VS:  BP 124/84   Pulse 84   Ht '5\' 3"'$  (1.6 m)   Wt 186 lb 3.2 oz (84.5 kg)   LMP 01/12/2012   SpO2 97%   BMI 32.98 kg/m  , BMI Body mass index is 32.98 kg/m. GENERAL:  Well appearing NECK:  No jugular venous distention, waveform within normal limits, carotid upstroke brisk and symmetric, no bruits, no thyromegaly LUNGS:  Clear to auscultation bilaterally CHEST: Well-healed surgical scar HEART:  PMI not displaced or sustained,S1 and S2 within normal limits, no S3, no S4, no clicks, no rubs, no murmurs ABD:  Flat, positive bowel sounds normal in frequency in pitch, no bruits, no rebound, no guarding, no midline pulsatile mass, no hepatomegaly, no splenomegaly EXT:  2 plus pulses throughout, no edema, no cyanosis no clubbing   EKG:  EKG is  ordered today. Sinus rhythm, rate 84, axis within normal limits, intervals within normal limits, no acute ST-T wave changes.  Recent Labs: 09/03/2021: ALT 14; BUN 10; Creatinine, Ser 1.00; Hemoglobin 14.0; Platelets 297.0; Potassium 3.8; Sodium 139    Lipid Panel    Component Value Date/Time   CHOL 237 (H) 03/05/2020 1619   TRIG 381 (H) 03/05/2020 1619   TRIG 79 12/26/2011 0000   HDL 49 (L) 03/05/2020 1619   CHOLHDL 4.8 03/05/2020 1619   VLDL 28.4 07/10/2018 1114   LDLCALC 134 (H) 03/05/2020 1619   LDLCALC 120 12/26/2011 0000      Wt Readings from Last 3 Encounters:  05/31/22 186 lb 3.2 oz (84.5 kg)  12/24/21 186 lb (84.4 kg)  10/18/21 179 lb (81.2 kg)      Other studies Reviewed: Additional studies/ records that were reviewed today include:  Labs Review of the above records demonstrates:   See elsewhere   ASSESSMENT  AND PLAN:  Mitral valve disease:    This was stable in May 2022.     However, she has had some increased SOB.  I will check an echo and BNP.  I will increase the Torsemide to 30 mg daily.     She will continue her potassium and I will check a BMET.    Hypertension:     Her blood pressure is at target.  Meds as above.   Cardiomyopathy:   Her EF was normal on the most recent echo in May 2022.  I will follow as above.      Prolonged QT:  This is prolonged but she has no symptoms.  I will check a magnesium.  We had this discussion and she will avoid QT prolonging meds.       Current medicines are reviewed at length with the patient today.  The patient does not have concerns regarding medicines.  The following changes have been made:    Labs/ tests ordered today include:   Orders Placed This Encounter  Procedures   Brain natriuretic peptide   Basic metabolic panel   Magnesium   EKG 12-Lead   ECHOCARDIOGRAM COMPLETE     Disposition:   FU with me in 12  months     Signed, [name], MD  05/31/2022 2:43 PM    [place]

## 2022-05-31 ENCOUNTER — Ambulatory Visit: Payer: 59 | Attending: Cardiology | Admitting: Cardiology

## 2022-05-31 ENCOUNTER — Ambulatory Visit: Payer: 59 | Admitting: Orthopaedic Surgery

## 2022-05-31 ENCOUNTER — Encounter: Payer: Self-pay | Admitting: Cardiology

## 2022-05-31 VITALS — BP 124/84 | HR 84 | Ht 63.0 in | Wt 186.2 lb

## 2022-05-31 DIAGNOSIS — Z954 Presence of other heart-valve replacement: Secondary | ICD-10-CM

## 2022-05-31 DIAGNOSIS — I420 Dilated cardiomyopathy: Secondary | ICD-10-CM

## 2022-05-31 DIAGNOSIS — I10 Essential (primary) hypertension: Secondary | ICD-10-CM

## 2022-05-31 DIAGNOSIS — Z9889 Other specified postprocedural states: Secondary | ICD-10-CM

## 2022-05-31 MED ORDER — TORSEMIDE 10 MG PO TABS
30.0000 mg | ORAL_TABLET | Freq: Every day | ORAL | 3 refills | Status: DC
  Filled 2023-01-14: qty 90, 30d supply, fill #0
  Filled 2023-02-09: qty 90, 30d supply, fill #1
  Filled 2023-03-11 (×2): qty 90, 30d supply, fill #2
  Filled 2023-04-10: qty 90, 30d supply, fill #3
  Filled 2023-05-08 – 2023-05-11 (×2): qty 90, 30d supply, fill #4

## 2022-05-31 NOTE — Patient Instructions (Signed)
Medication Instructions:  INCREASE torsemide to 30 mg daily  *If you need a refill on your cardiac medications before your next appointment, please call your pharmacy*   Lab Work: BMET, Mag, BNP today  If you have labs (blood work) drawn today and your tests are completely normal, you will receive your results only by: [place] (if you have MyChart) OR A paper copy in the mail If you have any lab test that is abnormal or we need to change your treatment, we will call you to review the results.   Testing/Procedures: Your physician has requested that you have an echocardiogram. Echocardiography is a painless test that uses sound waves to create images of your heart. It provides your doctor with information about the size and shape of your heart and how well your heart's chambers and valves are working. This procedure takes approximately one hour. There are no restrictions for this procedure. Please do NOT wear cologne, perfume, aftershave, or lotions (deodorant is allowed). Please arrive 15 minutes prior to your appointment time.  Follow-Up: At [facility], you and your health needs are our priority.  As part of our continuing mission to provide you with exceptional heart care, we have created designated Provider Care Teams.  These Care Teams include your primary Cardiologist (physician) and Advanced Practice Providers (APPs -  Physician Assistants and Nurse Practitioners) who all work together to provide you with the care you need, when you need it.  We recommend signing up for the patient portal called "MyChart".  Sign up information is provided on this After Visit Summary.  MyChart is used to connect with patients for Virtual Visits (Telemedicine).  Patients are able to view lab/test results, encounter notes, upcoming appointments, etc.  Non-urgent messages can be sent to your provider as well.   To learn more about what you can do with MyChart, go to  [email or web address].    Your next appointment:   12 month(s)  The format for your next appointment:   In Person  Provider:   [name], MD

## 2022-06-01 ENCOUNTER — Encounter: Payer: Self-pay | Admitting: Gastroenterology

## 2022-06-01 ENCOUNTER — Encounter: Payer: Self-pay | Admitting: Cardiology

## 2022-06-01 LAB — BASIC METABOLIC PANEL
BUN/Creatinine Ratio: 10 (ref 9–23)
BUN: 12 mg/dL (ref 6–24)
CO2: 22 mmol/L (ref 20–29)
Calcium: 10 mg/dL (ref 8.7–10.2)
Chloride: 98 mmol/L (ref 96–106)
Creatinine, Ser: 1.17 mg/dL — ABNORMAL HIGH (ref 0.57–1.00)
Glucose: 87 mg/dL (ref 70–99)
Potassium: 4.1 mmol/L (ref 3.5–5.2)
Sodium: 146 mmol/L — ABNORMAL HIGH (ref 134–144)
eGFR: 58 mL/min/{1.73_m2} — ABNORMAL LOW (ref >59)

## 2022-06-01 LAB — MAGNESIUM: Magnesium: 2.3 mg/dL (ref 1.6–2.3)

## 2022-06-01 LAB — BRAIN NATRIURETIC PEPTIDE: BNP: 10.5 pg/mL (ref 0.0–100.0)

## 2022-06-06 ENCOUNTER — Other Ambulatory Visit: Payer: Self-pay | Admitting: *Deleted

## 2022-06-06 ENCOUNTER — Encounter: Payer: Self-pay | Admitting: Cardiology

## 2022-06-06 DIAGNOSIS — I10 Essential (primary) hypertension: Secondary | ICD-10-CM

## 2022-06-06 DIAGNOSIS — R7989 Other specified abnormal findings of blood chemistry: Secondary | ICD-10-CM

## 2022-06-06 DIAGNOSIS — Z79899 Other long term (current) drug therapy: Secondary | ICD-10-CM

## 2022-06-07 ENCOUNTER — Ambulatory Visit (AMBULATORY_SURGERY_CENTER): Payer: Self-pay | Admitting: *Deleted

## 2022-06-07 ENCOUNTER — Other Ambulatory Visit: Payer: Self-pay

## 2022-06-07 VITALS — Ht 63.0 in | Wt 185.0 lb

## 2022-06-07 DIAGNOSIS — Z86010 Personal history of colonic polyps: Secondary | ICD-10-CM

## 2022-06-07 DIAGNOSIS — Z800 Family history of malignant neoplasm of digestive organs: Secondary | ICD-10-CM

## 2022-06-07 DIAGNOSIS — Z1509 Genetic susceptibility to other malignant neoplasm: Secondary | ICD-10-CM

## 2022-06-07 MED ORDER — ONDANSETRON HCL 4 MG PO TABS: 4.0000 mg | ORAL_TABLET | Freq: Three times a day (TID) | ORAL | 0 refills | Status: DC | PRN

## 2022-06-07 MED ORDER — NA SULFATE-K SULFATE-MG SULF 17.5-3.13-1.6 GM/177ML PO SOLN: 1.0000 | Freq: Once | ORAL | 0 refills | Status: AC

## 2022-06-07 NOTE — Progress Notes (Signed)
Pre visit completed over telephone,  Instructions sent through [facility] for Zofran sent in with Suprep rx.  No egg or soy allergy known to patient  No issues known to pt with past sedation with any surgeries or procedures Patient denies ever being told they had issues or difficulty with intubation  No FH of Malignant Hyperthermia Pt is not on diet pills Pt is not on  home 02  Pt is not on blood thinners  Pt denies issues with constipation  No A fib or A flutter  Pt instructed to use Single[employer]com or GoodRx for a price reduction on prep

## 2022-06-15 LAB — BASIC METABOLIC PANEL
BUN/Creatinine Ratio: 13 (ref 9–23)
BUN: 15 mg/dL (ref 6–24)
CO2: 24 mmol/L (ref 20–29)
Calcium: 9.8 mg/dL (ref 8.7–10.2)
Chloride: 98 mmol/L (ref 96–106)
Creatinine, Ser: 1.19 mg/dL — ABNORMAL HIGH (ref 0.57–1.00)
Glucose: 77 mg/dL (ref 70–99)
Potassium: 4.4 mmol/L (ref 3.5–5.2)
Sodium: 138 mmol/L (ref 134–144)
eGFR: 57 mL/min/{1.73_m2} — ABNORMAL LOW (ref >59)

## 2022-06-17 ENCOUNTER — Other Ambulatory Visit: Payer: Self-pay

## 2022-06-17 ENCOUNTER — Encounter: Payer: Self-pay | Admitting: Gastroenterology

## 2022-06-17 DIAGNOSIS — M1712 Unilateral primary osteoarthritis, left knee: Secondary | ICD-10-CM

## 2022-06-17 NOTE — Telephone Encounter (Signed)
Can you order mri left knee and let patient know

## 2022-06-21 ENCOUNTER — Encounter: Payer: Self-pay | Admitting: Gastroenterology

## 2022-06-21 ENCOUNTER — Ambulatory Visit (AMBULATORY_SURGERY_CENTER): Payer: 59 | Admitting: Gastroenterology

## 2022-06-21 VITALS — BP 105/68 | HR 87 | Temp 97.1°F | Resp 21 | Ht 63.0 in | Wt 189.6 lb

## 2022-06-21 DIAGNOSIS — Z1509 Genetic susceptibility to other malignant neoplasm: Secondary | ICD-10-CM | POA: Diagnosis not present

## 2022-06-21 DIAGNOSIS — Z09 Encounter for follow-up examination after completed treatment for conditions other than malignant neoplasm: Secondary | ICD-10-CM

## 2022-06-21 DIAGNOSIS — K635 Polyp of colon: Secondary | ICD-10-CM | POA: Diagnosis not present

## 2022-06-21 DIAGNOSIS — D122 Benign neoplasm of ascending colon: Secondary | ICD-10-CM

## 2022-06-21 DIAGNOSIS — Z86010 Personal history of colonic polyps: Secondary | ICD-10-CM | POA: Diagnosis not present

## 2022-06-21 DIAGNOSIS — Z800 Family history of malignant neoplasm of digestive organs: Secondary | ICD-10-CM

## 2022-06-21 MED ORDER — SODIUM CHLORIDE 0.9 % IV SOLN: 500.0000 mL | Freq: Once | INTRAVENOUS | Status: DC

## 2022-06-21 NOTE — Op Note (Addendum)
[facility] Patient Name: [name] Procedure Date: 06/21/2022 8:44 AM MRN: [id number] Endoscopist: Ladene Artist , MD, [id number] Age: 46 Referring MD:  Date of Birth: 12-23-1975 Gender: Female Account #: [id number] Procedure:                Colonoscopy Indications:              Lynch syndrome. High risk colon cancer                            surveillance: Personal history of non-advanced                            adenomatous and sessile serrate colon polyps Medicines:                Monitored Anesthesia Care Procedure:                Pre-Anesthesia Assessment:                           - Prior to the procedure, a History and Physical                            was performed, and patient medications and                            allergies were reviewed. The patient's tolerance of                            previous anesthesia was also reviewed. The risks                            and benefits of the procedure and the sedation                            options and risks were discussed with the patient.                            All questions were answered, and informed consent                            was obtained. Prior Anticoagulants: The patient has                            taken no anticoagulant or antiplatelet agents. ASA                            Grade Assessment: II - A patient with mild systemic                            disease. After reviewing the risks and benefits,                            the patient was deemed in satisfactory condition to  undergo the procedure.                           After obtaining informed consent, the colonoscope                            was passed under direct vision. Throughout the                            procedure, the patient's blood pressure, pulse, and                            oxygen saturations were monitored continuously. The                            Olympus CF-HQ190L  ([id number]) Colonoscope was                            introduced through the anus and advanced to the the                            cecum, identified by appendiceal orifice and                            ileocecal valve. The ileocecal valve, appendiceal                            orifice, and rectum were photographed. The quality                            of the bowel preparation was good. The colonoscopy                            was performed without difficulty. The patient                            tolerated the procedure well. Scope In: 8:47:52 AM Scope Out: 9:02:40 AM Scope Withdrawal Time: 0 hours 10 minutes 42 seconds  Total Procedure Duration: 0 hours 14 minutes 48 seconds  Findings:                 The perianal and digital rectal examinations were                            normal.                           A 7 mm polyp was found in the ascending colon. The                            polyp was sessile. The polyp was removed with a                            cold snare. Resection and retrieval were complete.  Scattered small-mouthed diverticula were found in                            the entire colon. There was no evidence of                            diverticular bleeding.                           Internal hemorrhoids were found during                            retroflexion. The hemorrhoids were small and Grade                            I (internal hemorrhoids that do not prolapse).                           The exam was otherwise without abnormality on                            direct and retroflexion views. Complications:            No immediate complications. Estimated blood loss:                            None. Estimated Blood Loss:     Estimated blood loss: none. Impression:               - One 7 mm polyp in the ascending colon. Resected,                            retieved.                           - Mild diverticulosis in the entire  examined colon.                           - Internal hemorrhoids.                           - The examination was otherwise normal on direct                            and retroflexion views. Recommendation:           - Repeat colonoscopy in 1 year for surveillance.                           - Patient has a contact number available for                            emergencies. The signs and symptoms of potential                            delayed complications were discussed with the  patient. Return to normal activities tomorrow.                            Written discharge instructions were provided to the                            patient.                           - Resume previous diet.                           - Continue present medications.                           - Await pathology results. Ladene Artist, MD 06/21/2022 9:10:13 AM This report has been signed electronically.

## 2022-06-21 NOTE — Progress Notes (Signed)
Pt's states no medical or surgical changes since previsit or office visit. 

## 2022-06-21 NOTE — Progress Notes (Signed)
Sedate, gd SR, tolerated procedure well, VSS, report to RN 

## 2022-06-21 NOTE — Patient Instructions (Addendum)
Handouts Provided: Polyps and Diverticulosis  - Repeat colonoscopy in 1 year for surveillance. - Patient has a contact number available for emergencies. The signs and symptoms of potential delayed complications were discussed with the patient. Return to normal activities tomorrow.  Written discharge instructions were provided to the patient. - Resume previous diet. - Continue present medications. - Await pathology results.  YOU HAD AN ENDOSCOPIC PROCEDURE TODAY AT [facility]:   Refer to the procedure report that was given to you for any specific questions about what was found during the examination.  If the procedure report does not answer your questions, please call your gastroenterologist to clarify.  If you requested that your care partner not be given the details of your procedure findings, then the procedure report has been included in a sealed envelope for you to review at your convenience later.  YOU SHOULD EXPECT: Some feelings of bloating in the abdomen. Passage of more gas than usual.  Walking can help get rid of the air that was put into your GI tract during the procedure and reduce the bloating. If you had a lower endoscopy (such as a colonoscopy or flexible sigmoidoscopy) you may notice spotting of blood in your stool or on the toilet paper. If you underwent a bowel prep for your procedure, you may not have a normal bowel movement for a few days.  Please Note:  You might notice some irritation and congestion in your nose or some drainage.  This is from the oxygen used during your procedure.  There is no need for concern and it should clear up in a day or so.  SYMPTOMS TO REPORT IMMEDIATELY:  Following lower endoscopy (colonoscopy or flexible sigmoidoscopy):  Excessive amounts of blood in the stool  Significant tenderness or worsening of abdominal pains  Swelling of the abdomen that is new, acute  Fever of 100F or higher  For urgent or emergent issues, a  gastroenterologist can be reached at any hour by calling [phone]. Do not use MyChart messaging for urgent concerns.    DIET:  We do recommend a small meal at first, but then you may proceed to your regular diet.  Drink plenty of fluids but you should avoid alcoholic beverages for 24 hours.  ACTIVITY:  You should plan to take it easy for the rest of today and you should NOT DRIVE or use heavy machinery until tomorrow (because of the sedation medicines used during the test).    FOLLOW UP: Our staff will call the number listed on your records the next business day following your procedure.  We will call around 7:15- 8:00 am to check on you and address any questions or concerns that you may have regarding the information given to you following your procedure. If we do not reach you, we will leave a message.     If any biopsies were taken you will be contacted by phone or by letter within the next 1-3 weeks.  Please call us at [phone] if you have not heard about the biopsies in 3 weeks.    SIGNATURES/CONFIDENTIALITY: You and/or your care partner have signed paperwork which will be entered into your electronic medical record.  These signatures attest to the fact that that the information above on your After Visit Summary has been reviewed and is understood.  Full responsibility of the confidentiality of this discharge information lies with you and/or your care-partner.

## 2022-06-21 NOTE — Progress Notes (Signed)
History & Physical  Primary Care Physician:  [name], MD Primary Gastroenterologist: Lucio Edward, MD  CHIEF COMPLAINT: Lynch syndrome, Personal history of colon polyps   HPI: [name] is a 46 y.o. female with Lynch syndrome and personal history of adenomatous and sessile serrated colon polyps for colonoscopy.   Past Medical History:  Diagnosis Date   Adenomatous colon polyp 12/2012   Allergic rhinitis    Arthritis    Cellulitis    Common migraine    Diastolic dysfunction    Dysrhythmia    External hemorrhoid    Genital herpes    GERD (gastroesophageal reflux disease)    Hx of migraines    Hyperlipidemia    Hypertension    Lynch syndrome    Mitral regurgitation    MVP (mitral valve prolapse)    Plica syndrome of left knee    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   32 mm Sorin Memo 4D ring annuloplasty via right mini thoracotomy approach    Past Surgical History:  Procedure Laterality Date   ABDOMINAL HYSTERECTOMY     CHOLECYSTECTOMY N/A 01/16/2020   Procedure: LAPAROSCOPIC CHOLECYSTECTOMY WITH INTRAOPERATIVE CHOLANGIOGRAM;  Surgeon: Armandina Gemma, MD;  Location: [facility];  Service: General;  Laterality: N/A;   COLONOSCOPY     KNEE ARTHROSCOPY WITH MEDIAL MENISECTOMY Left 06/07/2013   Procedure: LEFT KNEE ARTHROSCOPY WITH SYNOVECTOMY LIMITED, ARTHROSCOPY KNEE WITH DEBRIDEMENT/SHAVING (CHONDROPLASTY), ARTHROSCOPY KNEE  ChondroplastyWITH MEDIAL AND LATERAL  MENISECTOMY;  Surgeon: Renette Butters, MD;  Location: [place];  Service: Orthopedics;  Laterality: Left;   left knee arthroscopy     MITRAL VALVE REPAIR Right 11/28/2018   Procedure: MINIMALLY INVASIVE MITRAL VALVE REPAIR (MVR) - Using Memo 4D Ring Size 32;  Surgeon: [name], MD;  Location: [place];  Service: Open Heart Surgery;  Laterality: Right;   patrial hysterectomy     POLYPECTOMY     SALPINGOOPHORECTOMY  2022   SYNOVECTOMY Left 06/07/2013    Procedure: SYNOVECTOMY;  Surgeon: Renette Butters, MD;  Location: [place];  Service: Orthopedics;  Laterality: Left;   TEE WITHOUT CARDIOVERSION  10/11/2011   Procedure: TRANSESOPHAGEAL ECHOCARDIOGRAM (TEE);  Surgeon: Josue Hector, MD;  Location: [facility];  Service: Cardiovascular;  Laterality: N/A;   TEE WITHOUT CARDIOVERSION N/A 11/28/2018   Procedure: TRANSESOPHAGEAL ECHOCARDIOGRAM (TEE);  Surgeon: [name], MD;  Location: [place];  Service: Open Heart Surgery;  Laterality: N/A;   TUBAL LIGATION     WISDOM TOOTH EXTRACTION      Prior to Admission medications   Medication Sig Start Date End Date Taking? Authorizing Provider  estradiol (VIVELLE-DOT) 0.1 MG/24HR patch Place 1 patch onto the skin 2 (two) times a week. 05/29/21  Yes [provider]  ondansetron (ZOFRAN) 4 MG tablet Take 1 tablet (4 mg total) by mouth every 8 (eight) hours as needed for nausea or vomiting. 06/07/22  Yes Ladene Artist, MD  Potassium Chloride ER 20 MEQ TBCR Take 20 mEq by mouth 2 (two) times daily. 05/31/21 06/21/22 Yes [name], MD  torsemide (DEMADEX) 10 MG tablet Take 3 tablets (30 mg total) by mouth daily. 05/31/22  Yes [name], MD  HYDROcodone-acetaminophen (NORCO) 5-325 MG tablet Take 1 tablet by mouth 2 (two) times daily as needed. Patient not taking: Reported on 06/07/2022 05/18/22   Aundra Dubin, PA-C  traMADol (ULTRAM) 50 MG tablet Take 1 tablet (50 mg total) by mouth every 12 (twelve)  hours as needed. Patient not taking: Reported on 06/07/2022 05/17/22   Aundra Dubin, PA-C  valACYclovir (VALTREX) 500 MG tablet Take 500 mg by mouth as needed. 06/18/21   [provider]    Current Outpatient Medications  Medication Sig Dispense Refill   estradiol (VIVELLE-DOT) 0.1 MG/24HR patch Place 1 patch onto the skin 2 (two) times a week.     ondansetron (ZOFRAN) 4 MG tablet Take 1 tablet (4 mg total) by mouth every 8 (eight) hours as needed for  nausea or vomiting. 4 tablet 0   Potassium Chloride ER 20 MEQ TBCR Take 20 mEq by mouth 2 (two) times daily. 180 tablet 0   torsemide (DEMADEX) 10 MG tablet Take 3 tablets (30 mg total) by mouth daily. 270 tablet 3   HYDROcodone-acetaminophen (NORCO) 5-325 MG tablet Take 1 tablet by mouth 2 (two) times daily as needed. (Patient not taking: Reported on 06/07/2022) 10 tablet 0   traMADol (ULTRAM) 50 MG tablet Take 1 tablet (50 mg total) by mouth every 12 (twelve) hours as needed. (Patient not taking: Reported on 06/07/2022) 30 tablet 0   valACYclovir (VALTREX) 500 MG tablet Take 500 mg by mouth as needed.     Current Facility-Administered Medications  Medication Dose Route Frequency Provider Last Rate Last Admin   0.9 %  sodium chloride infusion  500 mL Intravenous Once Ladene Artist, MD        Allergies as of 06/21/2022 - Review Complete 06/21/2022  Allergen Reaction Noted   Bee venom  06/01/2020    Family History  Problem Relation Age of Onset   Other Mother        lynch syndrome   Colon cancer Father 68   Colon polyps Maternal Uncle    Prostate cancer Maternal Uncle 64   Colon cancer Paternal Aunt 14   Colon cancer Paternal Uncle 105   Colon cancer Paternal Uncle 68   Multiple myeloma Maternal Grandmother 68   Parkinsonism Paternal Grandfather    Other Daughter        Lynch syndrome   Breast cancer Cousin        lynch syndrome    Breast cancer Other        Paternal Great Grandmother   Esophageal cancer Neg Hx    Rectal cancer Neg Hx     Social History   Socioeconomic History   Marital status: Married    Spouse name: Not on file   Number of children: 2   Years of education: Not on file   Highest education level: Not on file  Occupational History   Occupation: patient account rep    Employer: [employer]  Tobacco Use   Smoking status: Some Days    Packs/day: 0.50    Years: 12.00    Total pack years: 6.00    Types: Cigars, Cigarettes    Last attempt to  quit: 11/29/2010    Years since quitting: 11.5   Smokeless tobacco: Never  Vaping Use   Vaping Use: Never used  Substance and Sexual Activity   Alcohol use: Not Currently    Alcohol/week: 2.0 standard drinks of alcohol    Types: 2 Glasses of wine per week    Comment: social   Drug use: No   Sexual activity: Yes    Birth control/protection: Surgical  Other Topics Concern   Not on file  Social History Narrative   Not on file   Social Determinants of Radio broadcast assistant  Strain: Not on file  Food Insecurity: Not on file  Transportation Needs: Not on file  Physical Activity: Not on file  Stress: Not on file  Social Connections: Not on file  Intimate Partner Violence: Not on file    Review of Systems:  All systems reviewed were negative except where noted in HPI.   Physical Exam: General:  Alert, well-developed, in NAD Head:  Normocephalic and atraumatic. Eyes:  Sclera clear, no icterus.   Conjunctiva pink. Ears:  Normal auditory acuity. Mouth:  No deformity or lesions.  Neck:  Supple; no masses . Lungs:  Clear throughout to auscultation.   No wheezes, crackles, or rhonchi. No acute distress. Heart:  Regular rate and rhythm; no murmurs. Abdomen:  Soft, nondistended, nontender. No masses, hepatomegaly. No obvious masses.  Normal bowel .    Rectal:  Deferred   Msk:  Symmetrical without gross deformities.. Pulses:  Normal pulses noted. Extremities:  Without edema. Neurologic:  Alert and  oriented x4;  grossly normal neurologically. Skin:  Intact without significant lesions or rashes. Psych:  Alert and cooperative. Normal mood and affect.   Impression / Plan:   Lynch syndrome and personal history of adenomatous and sessile serrated colon polyps for colonoscopy.  Pricilla Riffle. Fuller Plan  06/21/2022, 8:39 AM See Shea Evans, [facility], to contact our on call provider

## 2022-06-21 NOTE — Progress Notes (Signed)
Called to room to assist during endoscopic procedure.  Patient ID and intended procedure confirmed with present staff. Received instructions for my participation in the procedure from the performing physician.  

## 2022-06-22 ENCOUNTER — Ambulatory Visit (HOSPITAL_COMMUNITY)
Admission: RE | Admit: 2022-06-22 | Discharge: 2022-06-22 | Disposition: A | Discharge status: Home / Self Care | Payer: 59 | Source: Ambulatory Visit | Attending: Cardiology | Admitting: Cardiology

## 2022-06-22 ENCOUNTER — Telehealth: Payer: Self-pay

## 2022-06-22 DIAGNOSIS — I340 Nonrheumatic mitral (valve) insufficiency: Secondary | ICD-10-CM

## 2022-06-22 DIAGNOSIS — Z9889 Other specified postprocedural states: Secondary | ICD-10-CM | POA: Diagnosis present

## 2022-06-22 LAB — ECHOCARDIOGRAM COMPLETE
Area-P 1/2: 3.77 cm2
MV VTI: 1.31 cm2
P 1/2 time: 59 msec
S' Lateral: 2.6 cm

## 2022-06-22 NOTE — Telephone Encounter (Signed)
No answer, left message to call if having any issues or concerns, B.Marshe Shrestha RN 

## 2022-06-23 ENCOUNTER — Other Ambulatory Visit: Payer: Self-pay | Admitting: Nephrology

## 2022-06-23 DIAGNOSIS — N1831 Chronic kidney disease, stage 3a: Secondary | ICD-10-CM

## 2022-06-28 ENCOUNTER — Encounter: Payer: Self-pay | Admitting: Cardiology

## 2022-06-28 NOTE — Telephone Encounter (Signed)
Patient stated she had a question about her echo (mitral stenosis and if the minimal regurgitation is normal after mitral repair). She wants to know if she should be worried.

## 2022-06-29 ENCOUNTER — Ambulatory Visit
Admission: RE | Admit: 2022-06-29 | Discharge: 2022-06-29 | Disposition: A | Discharge status: Home / Self Care | Payer: 59 | Source: Ambulatory Visit | Attending: Nephrology | Admitting: Nephrology

## 2022-06-29 DIAGNOSIS — N1831 Chronic kidney disease, stage 3a: Secondary | ICD-10-CM | POA: Insufficient documentation

## 2022-07-08 ENCOUNTER — Ambulatory Visit
Admission: RE | Admit: 2022-07-08 | Discharge: 2022-07-08 | Disposition: A | Discharge status: Home / Self Care | Payer: 59 | Source: Ambulatory Visit | Attending: Orthopaedic Surgery | Admitting: Orthopaedic Surgery

## 2022-07-08 DIAGNOSIS — M1712 Unilateral primary osteoarthritis, left knee: Secondary | ICD-10-CM

## 2022-07-11 ENCOUNTER — Encounter: Payer: Self-pay | Admitting: Gastroenterology

## 2022-07-13 ENCOUNTER — Ambulatory Visit: Payer: 59 | Admitting: Orthopaedic Surgery

## 2022-07-22 ENCOUNTER — Ambulatory Visit: Payer: 59 | Admitting: Orthopaedic Surgery

## 2022-09-07 ENCOUNTER — Ambulatory Visit (INDEPENDENT_AMBULATORY_CARE_PROVIDER_SITE_OTHER): Payer: 59 | Admitting: Orthopaedic Surgery

## 2022-09-07 ENCOUNTER — Encounter: Payer: Self-pay | Admitting: Orthopaedic Surgery

## 2022-09-07 DIAGNOSIS — M1712 Unilateral primary osteoarthritis, left knee: Secondary | ICD-10-CM

## 2022-09-07 NOTE — Progress Notes (Signed)
Office Visit Note   Patient: [name]           Date of Birth: 06-15-1976           MRN: [id number] Visit Date: 09/07/2022              Requested by: [name], MD [address] [place],  [place] [address] PCP: [name], MD   Assessment & Plan: Visit Diagnoses:  1. Primary osteoarthritis of left knee     Plan: MRI findings consistent with patellofemoral maltracking and chondromalacia.  Patient has done extensive physical therapy and underwent lateral release in 2008.  She continues to have significant pain and at this point she is interested in exploring other surgical options.  Will refer to Dr. Sammuel Hines to discuss tibial tubercle realignment surgery.    Follow-Up Instructions: No follow-ups on file.   Orders:  Orders Placed This Encounter  Procedures   Ambulatory referral to Orthopedic Surgery   No orders of the defined types were placed in this encounter.     Procedures: No procedures performed   Clinical Data: No additional findings.   Subjective: Chief Complaint  Patient presents with   Left Knee - Follow-up    MRI review    HPI  Patient is here to discuss left knee MRI scan and reports.  Review of Systems   Objective: Vital Signs: LMP 01/12/2012   Physical Exam  Ortho Exam  Exam of left knee is unchanged.  Specialty Comments:  No specialty comments available.  Imaging: No results found.   PMFS History: Patient Active Problem List   Diagnosis Date Noted   Primary osteoarthritis of left knee 09/07/2022   Subacute cough 12/24/2021   Rash 12/24/2021   Primary hypertriglyceridemia 03/06/2020   Hyperglycemia 03/05/2020   Narrowing of intervertebral disc space 06/18/2019   Cardiomyopathy ([place]) 01/17/2019   Iron deficiency anemia secondary to inadequate dietary iron intake 12/10/2018   Chronic diastolic CHF (congestive heart failure) ([place]) 12/05/2018   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    Diuretic-induced hypokalemia 10/30/2018   Essential hypertension 07/10/2018   Snoring 10/28/2015   Other specified hypothyroidism 09/22/2014   Visit for screening mammogram 09/22/2014   Insomnia, persistent 03/19/2014   Lynch syndrome 11/28/2012   MR (mitral regurgitation) 10/28/2011   MVP (mitral valve prolapse) 10/28/2011   ALLERGIC RHINITIS 11/06/2007   GERD 11/06/2007   Past Medical History:  Diagnosis Date   Adenomatous colon polyp 12/2012   Allergic rhinitis    Arthritis    Cellulitis    Common migraine    Diastolic dysfunction    Dysrhythmia    External hemorrhoid    Genital herpes    GERD (gastroesophageal reflux disease)    Hx of migraines    Hyperlipidemia    Hypertension    Lynch syndrome    Mitral regurgitation    MVP (mitral valve prolapse)    Plica syndrome of left knee    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   32 mm Sorin Memo 4D ring annuloplasty via right mini thoracotomy approach    Family History  Problem Relation Age of Onset   Other Mother        lynch syndrome   Colon cancer Father 32   Colon polyps Maternal Uncle    Prostate cancer Maternal Uncle 64   Colon cancer Paternal Aunt 30   Colon cancer Paternal Uncle 68   Colon cancer Paternal Uncle 87  Multiple myeloma Maternal Grandmother 59   Parkinsonism Paternal Grandfather    Other Daughter        Lynch syndrome   Breast cancer Cousin        lynch syndrome    Breast cancer Other        Paternal Great Grandmother   Esophageal cancer Neg Hx    Rectal cancer Neg Hx     Past Surgical History:  Procedure Laterality Date   ABDOMINAL HYSTERECTOMY     CHOLECYSTECTOMY N/A 01/16/2020   Procedure: LAPAROSCOPIC CHOLECYSTECTOMY WITH INTRAOPERATIVE CHOLANGIOGRAM;  Surgeon: Armandina Gemma, MD;  Location: [facility];  Service: General;  Laterality: N/A;   COLONOSCOPY     KNEE ARTHROSCOPY WITH MEDIAL MENISECTOMY Left 06/07/2013   Procedure: LEFT KNEE ARTHROSCOPY WITH SYNOVECTOMY LIMITED,  ARTHROSCOPY KNEE WITH DEBRIDEMENT/SHAVING (CHONDROPLASTY), ARTHROSCOPY KNEE  ChondroplastyWITH MEDIAL AND LATERAL  MENISECTOMY;  Surgeon: Renette Butters, MD;  Location: [place];  Service: Orthopedics;  Laterality: Left;   left knee arthroscopy     MITRAL VALVE REPAIR Right 11/28/2018   Procedure: MINIMALLY INVASIVE MITRAL VALVE REPAIR (MVR) - Using Memo 4D Ring Size 32;  Surgeon: [name], MD;  Location: [place];  Service: Open Heart Surgery;  Laterality: Right;   patrial hysterectomy     POLYPECTOMY     SALPINGOOPHORECTOMY  2022   SYNOVECTOMY Left 06/07/2013   Procedure: SYNOVECTOMY;  Surgeon: Renette Butters, MD;  Location: [place];  Service: Orthopedics;  Laterality: Left;   TEE WITHOUT CARDIOVERSION  10/11/2011   Procedure: TRANSESOPHAGEAL ECHOCARDIOGRAM (TEE);  Surgeon: Josue Hector, MD;  Location[facility];  Service: Cardiovascular;  Laterality: N/A;   TEE WITHOUT CARDIOVERSION N/A 11/28/2018   Procedure: TRANSESOPHAGEAL ECHOCARDIOGRAM (TEE);  Surgeon: [name], MD;  Location: [place];  Service: Open Heart Surgery;  Laterality: N/A;   TUBAL LIGATION     WISDOM TOOTH EXTRACTION     Social History   Occupational History   Occupation: patient account rep    Employer: [employer]  Tobacco Use   Smoking status: Some Days    Packs/day: 0.50    Years: 12.00    Total pack years: 6.00    Types: Cigars, Cigarettes    Last attempt to quit: 11/29/2010    Years since quitting: 11.7   Smokeless tobacco: Never  Vaping Use   Vaping Use: Never used  Substance and Sexual Activity   Alcohol use: Not Currently    Alcohol/week: 2.0 standard drinks of alcohol    Types: 2 Glasses of wine per week    Comment: social   Drug use: No   Sexual activity: Yes    Birth control/protection: Surgical

## 2022-09-09 ENCOUNTER — Ambulatory Visit: Payer: Self-pay | Admitting: Internal Medicine

## 2022-09-14 ENCOUNTER — Ambulatory Visit (INDEPENDENT_AMBULATORY_CARE_PROVIDER_SITE_OTHER): Payer: 59

## 2022-09-14 ENCOUNTER — Ambulatory Visit (INDEPENDENT_AMBULATORY_CARE_PROVIDER_SITE_OTHER): Payer: 59 | Admitting: Orthopaedic Surgery

## 2022-09-14 ENCOUNTER — Other Ambulatory Visit (HOSPITAL_BASED_OUTPATIENT_CLINIC_OR_DEPARTMENT_OTHER): Payer: Self-pay

## 2022-09-14 ENCOUNTER — Ambulatory Visit (HOSPITAL_BASED_OUTPATIENT_CLINIC_OR_DEPARTMENT_OTHER): Payer: Self-pay | Admitting: Orthopaedic Surgery

## 2022-09-14 DIAGNOSIS — M1712 Unilateral primary osteoarthritis, left knee: Secondary | ICD-10-CM

## 2022-09-14 MED ORDER — OXYCODONE HCL 5 MG PO TABS
5.0000 mg | ORAL_TABLET | ORAL | 0 refills | Status: DC | PRN
  Filled 2022-09-14: qty 5, 1d supply, fill #0

## 2022-09-14 MED ORDER — ASPIRIN 325 MG PO TBEC
325.0000 mg | DELAYED_RELEASE_TABLET | Freq: Every day | ORAL | 0 refills | Status: DC
  Filled 2022-09-14: qty 14, 14d supply, fill #0

## 2022-09-14 MED ORDER — ACETAMINOPHEN 500 MG PO TABS
500.0000 mg | ORAL_TABLET | Freq: Three times a day (TID) | ORAL | 0 refills | Status: AC
  Filled 2022-09-14: qty 30, 10d supply, fill #0

## 2022-09-14 MED ORDER — IBUPROFEN 800 MG PO TABS
800.0000 mg | ORAL_TABLET | Freq: Three times a day (TID) | ORAL | 0 refills | Status: AC
  Filled 2022-09-14: qty 30, 10d supply, fill #0
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
## 2022-09-21 ENCOUNTER — Encounter (HOSPITAL_BASED_OUTPATIENT_CLINIC_OR_DEPARTMENT_OTHER): Payer: Self-pay | Admitting: Orthopaedic Surgery

## 2022-09-21 NOTE — Telephone Encounter (Signed)
When you respond can you include this information:   Disability and Out-of-Work Paperwork  If you would like to file disability (short term and/or and long term), FMLA, or other out-of-work paperwork, please mail, hand deliver, or fax the paperwork to our main office:   [facility]  [address], [place] [address] Phone: [phone] Fax: [phone]  We are unable to complete these forms in our [place] [facility] [place] office and want to ensure your paperwork is filed in an appropriate and proper manner.   Thank you for understanding.

## 2022-09-26 ENCOUNTER — Encounter (HOSPITAL_BASED_OUTPATIENT_CLINIC_OR_DEPARTMENT_OTHER): Payer: Self-pay | Admitting: Orthopaedic Surgery

## 2022-09-26 ENCOUNTER — Other Ambulatory Visit: Payer: Self-pay

## 2022-09-26 ENCOUNTER — Encounter: Payer: Self-pay | Admitting: Cardiology

## 2022-09-27 ENCOUNTER — Telehealth: Payer: Self-pay | Admitting: *Deleted

## 2022-09-27 ENCOUNTER — Other Ambulatory Visit: Payer: Self-pay | Admitting: Physician Assistant

## 2022-09-27 NOTE — Telephone Encounter (Signed)
Pt has been scheduled for a tele visit tomorrow, 09/28/22 2:40.  Medications reconciled / consent on file.  Pt did confirm she is not taking Aspirin 81 mg and that the Surgeon is having her start Aspirin 325 after her surgery.     Patient Consent for Virtual Visit        [name] has provided verbal consent on 09/27/2022 for a virtual visit (video or telephone).   CONSENT FOR VIRTUAL VISIT FOR:  [name]  By participating in this virtual visit I agree to the following:  I hereby voluntarily request, consent and authorize [facility] and its employed or contracted physicians, physician assistants, nurse practitioners or other licensed health care professionals (the Practitioner), to provide me with telemedicine health care services (the "Services") as deemed necessary by the treating Practitioner. I acknowledge and consent to receive the Services by the Practitioner via telemedicine. I understand that the telemedicine visit will involve communicating with the Practitioner through live audiovisual communication technology and the disclosure of certain medical information by electronic transmission. I acknowledge that I have been given the opportunity to request an in-person assessment or other available alternative prior to the telemedicine visit and am voluntarily participating in the telemedicine visit.  I understand that I have the right to withhold or withdraw my consent to the use of telemedicine in the course of my care at any time, without affecting my right to future care or treatment, and that the Practitioner or I may terminate the telemedicine visit at any time. I understand that I have the right to inspect all information obtained and/or recorded in the course of the telemedicine visit and may receive copies of available information for a reasonable fee.  I understand that some of the potential risks of receiving the Services via telemedicine include:   Delay or interruption in medical evaluation due to technological equipment failure or disruption; Information transmitted may not be sufficient (e.g. poor resolution of images) to allow for appropriate medical decision making by the Practitioner; and/or  In rare instances, security protocols could fail, causing a breach of personal health information.  Furthermore, I acknowledge that it is my responsibility to provide information about my medical history, conditions and care that is complete and accurate to the best of my ability. I acknowledge that Practitioner's advice, recommendations, and/or decision may be based on factors not within their control, such as incomplete or inaccurate data provided by me or distortions of diagnostic images or specimens that may result from electronic transmissions. I understand that the practice of medicine is not an exact science and that Practitioner makes no warranties or guarantees regarding treatment outcomes. I acknowledge that a copy of this consent can be made available to me via my patient portal ([facility]), or I can request a printed copy by calling the office of [facility].    I understand that my insurance will be billed for this visit.   I have read or had this consent read to me. I understand the contents of this consent, which adequately explains the benefits and risks of the Services being provided via telemedicine.  I have been provided ample opportunity to ask questions regarding this consent and the Services and have had my questions answered to my satisfaction. I give my informed consent for the services to be provided through the use of telemedicine in my medical care

## 2022-09-27 NOTE — Telephone Encounter (Signed)
Pt has been scheduled for a tele visit, 09/28/22. Consent on file / medications reconciled.

## 2022-09-27 NOTE — Telephone Encounter (Signed)
Primary Cardiologist:James Hochrein, MD   Preoperative team, please contact this patient and set up a phone call appointment for further preoperative risk assessment. Please obtain consent and complete medication review. Thank you for your help.   She has both aspirin 81 mg and 325 mg on med list. Will need to clarify at time of virtual visit but from a cardiac perspective, I could not identify an indication for long-term aspirin therapy.  Emmaline Life, NP-C  09/27/2022, 12:12 PM [address] Office [phone] Fax [phone]

## 2022-09-27 NOTE — Progress Notes (Unsigned)
Virtual Visit via Telephone Note   Because of [name]'s co-morbid illnesses, she is at least at moderate risk for complications without adequate follow up.  This format is felt to be most appropriate for this patient at this time.  The patient did not have access to video technology/had technical difficulties with video requiring transitioning to audio format only (telephone).  All issues noted in this document were discussed and addressed.  No physical exam could be performed with this format.  Please refer to the patient's chart for her consent to telehealth for [name], [name].  Evaluation Performed:  Preoperative cardiovascular risk assessment _____________   Date:  09/27/2022   Patient ID:  [name], DOB 26-Oct-1975, MRN [id number] Patient Location:  Home Provider location:   Office  Primary Care Provider:  [name], MD Primary Cardiologist:  [name], MD  Chief Complaint / Patient Profile   47 y.o. y/o female with a h/o mitral valve prolapse and mitral regurgitation with bileaflet prolapse with minimally invasive mitral valve repair in 2020 With evidence of cardiomyopathy on echo at that time and normalization of LV function on echo 2023, HTN, prolonged QT,  who is pending left knee arthroplasty with MAC harvest and presents today for telephonic preoperative cardiovascular risk assessment.  History of Present Illness    [name] is a 47 y.o. female who presents via audio/video conferencing for a telehealth visit today.  Pt was last seen in cardiology clinic on 05/31/22 by Dr. Percival Spanish.  At that time [name] was doing well.  The patient is now pending procedure as outlined above. Since her last visit, she *** denies chest pain, shortness of breath, lower extremity edema, fatigue, palpitations, melena, hematuria, hemoptysis, diaphoresis, weakness, presyncope, syncope, orthopnea, and PND.   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   32 mm Sorin Memo 4D ring annuloplasty via right mini thoracotomy approach   Past Surgical History:  Procedure Laterality Date   ABDOMINAL HYSTERECTOMY     CHOLECYSTECTOMY N/A 01/16/2020   Procedure: LAPAROSCOPIC CHOLECYSTECTOMY WITH INTRAOPERATIVE CHOLANGIOGRAM;  Surgeon: Armandina Gemma, MD;  Location: [facility];  Service: General;  Laterality: N/A;   COLONOSCOPY     KNEE ARTHROSCOPY WITH MEDIAL MENISECTOMY Left 06/07/2013   Procedure: LEFT KNEE ARTHROSCOPY WITH SYNOVECTOMY LIMITED, ARTHROSCOPY KNEE WITH DEBRIDEMENT/SHAVING (CHONDROPLASTY), ARTHROSCOPY KNEE  ChondroplastyWITH MEDIAL AND LATERAL  MENISECTOMY;  Surgeon: Renette Butters, MD;  Location: [facility];  Service: Orthopedics;  Laterality: Left;   left knee arthroscopy     MITRAL VALVE REPAIR Right 11/28/2018   Procedure: MINIMALLY INVASIVE MITRAL VALVE REPAIR (MVR) - Using Memo 4D Ring Size 32;  Surgeon: [name], MD;  Location: [facility];  Service: Open Heart Surgery;  Laterality: Right;   patrial hysterectomy     POLYPECTOMY     SALPINGOOPHORECTOMY  2022   SYNOVECTOMY  Left 06/07/2013   Procedure: SYNOVECTOMY;  Surgeon: Renette Butters, MD;  Location: [facility];  Service: Orthopedics;  Laterality: Left;   TEE WITHOUT CARDIOVERSION  10/11/2011   Procedure: TRANSESOPHAGEAL ECHOCARDIOGRAM (TEE);  Surgeon: Josue Hector, MD;  Location: [facility];  Service: Cardiovascular;  Laterality: N/A;   TEE WITHOUT CARDIOVERSION  N/A 11/28/2018   Procedure: TRANSESOPHAGEAL ECHOCARDIOGRAM (TEE);  Surgeon: [name], MD;  Location: [facility];  Service: Open Heart Surgery;  Laterality: N/A;   TUBAL LIGATION     WISDOM TOOTH EXTRACTION      Allergies  Allergies  Allergen Reactions   Bee Venom     Home Medications    Prior to Admission medications   Medication Sig Start Date End Date Taking? Authorizing Provider  aspirin EC 325 MG tablet Take 1 tablet (325 mg total) by mouth daily. Patient not taking: Reported on 09/27/2022 09/14/22   Vanetta Mulders, MD  estradiol (VIVELLE-DOT) 0.1 MG/24HR patch Place 1 patch onto the skin 2 (two) times a week. 05/29/21   [provider]  oxyCODONE (OXY IR/ROXICODONE) 5 MG immediate release tablet Take 1 tablet (5 mg total) by mouth every 4 (four) hours as needed (severe pain). Patient not taking: Reported on 09/27/2022 09/14/22   Vanetta Mulders, MD  Potassium Chloride ER 20 MEQ TBCR Take 20 mEq by mouth 2 (two) times daily. 05/31/21 09/27/22  [name], MD  torsemide (DEMADEX) 10 MG tablet Take 3 tablets (30 mg total) by mouth daily. 05/31/22   [name], MD  traMADol (ULTRAM) 50 MG tablet Take 1 tablet (50 mg total) by mouth every 12 (twelve) hours as needed. 05/17/22   Aundra Dubin, PA-C  valACYclovir (VALTREX) 500 MG tablet Take 500 mg by mouth as needed. 06/18/21   [provider]    Physical Exam    Vital Signs:  [name] does not have vital signs available for review today.***  Given telephonic nature of communication, physical exam is limited. AAOx3. NAD. Normal affect.  Speech and respirations are unlabored.  Accessory Clinical Findings    None  Assessment & Plan    1.  Preoperative Cardiovascular Risk Assessment:  The patient was advised that if she develops new symptoms prior to surgery to contact our office to arrange for a follow-up visit, and she verbalized understanding.  Ask about aspirin Per office  protocol, he may hold *** for *** days prior to procedure and should resume as soon as hemodynamically stable postoperatively.  A copy of this note will be routed to requesting surgeon.  Time:   Today, I have spent *** minutes with the patient with telehealth technology discussing medical history, symptoms, and management plan.     Emmaline Life, NP  09/27/2022, 3:16 PM

## 2022-09-27 NOTE — Telephone Encounter (Signed)
   Pre-operative Risk Assessment    Patient Name: [name]  DOB: June 02, 1976 MRN: [id number]      Request for Surgical Clearance    Procedure:   LEFT KNEE ARTHORPLASTY WITH MACI HARVEST  Date of Surgery:  Clearance 10/04/22                                 Surgeon:  Vanetta Mulders, MD Surgeon's Group or Practice Name:  [facility] Phone number:  [phone] Fax number:  [phone]   Type of Clearance Requested:   - Medical  - Pharmacy:  Hold Aspirin NOT INDICATED HOW LONG   Type of Anesthesia:   REGIONAL   Additional requests/questions:    [name]   09/27/2022, 11:49 AM

## 2022-09-28 ENCOUNTER — Encounter: Payer: Self-pay | Admitting: Nurse Practitioner

## 2022-09-28 ENCOUNTER — Ambulatory Visit: Payer: 59 | Attending: Internal Medicine | Admitting: Nurse Practitioner

## 2022-09-28 DIAGNOSIS — Z01810 Encounter for preprocedural cardiovascular examination: Secondary | ICD-10-CM | POA: Diagnosis not present

## 2022-09-28 NOTE — Telephone Encounter (Signed)
[name], MD  Ferd Glassing, LPN; Cv Div Nl Triage; Cristopher Estimable, RN2 hours ago (1:49 PM)    OK for surgery based on ACC/AHA guidelines.  No need for further testing.  Please document in chart and send to preop pool so they can remove from the work queue.      Patient had a pre-op telehealth visit today

## 2022-10-03 ENCOUNTER — Encounter (HOSPITAL_BASED_OUTPATIENT_CLINIC_OR_DEPARTMENT_OTHER)
Admission: RE | Admit: 2022-10-03 | Discharge: 2022-10-03 | Disposition: A | Discharge status: Home / Self Care | Payer: 59 | Source: Ambulatory Visit | Attending: Orthopaedic Surgery | Admitting: Orthopaedic Surgery

## 2022-10-03 DIAGNOSIS — K219 Gastro-esophageal reflux disease without esophagitis: Secondary | ICD-10-CM | POA: Diagnosis not present

## 2022-10-03 DIAGNOSIS — M25862 Other specified joint disorders, left knee: Secondary | ICD-10-CM | POA: Diagnosis present

## 2022-10-03 DIAGNOSIS — F17290 Nicotine dependence, other tobacco product, uncomplicated: Secondary | ICD-10-CM | POA: Diagnosis not present

## 2022-10-03 DIAGNOSIS — E785 Hyperlipidemia, unspecified: Secondary | ICD-10-CM | POA: Diagnosis not present

## 2022-10-03 DIAGNOSIS — I341 Nonrheumatic mitral (valve) prolapse: Secondary | ICD-10-CM | POA: Diagnosis not present

## 2022-10-03 DIAGNOSIS — F17210 Nicotine dependence, cigarettes, uncomplicated: Secondary | ICD-10-CM | POA: Diagnosis not present

## 2022-10-03 DIAGNOSIS — Z01818 Encounter for other preprocedural examination: Secondary | ICD-10-CM | POA: Diagnosis present

## 2022-10-03 DIAGNOSIS — I10 Essential (primary) hypertension: Secondary | ICD-10-CM | POA: Diagnosis not present

## 2022-10-03 DIAGNOSIS — Z79899 Other long term (current) drug therapy: Secondary | ICD-10-CM | POA: Diagnosis not present

## 2022-10-03 LAB — BASIC METABOLIC PANEL
Anion gap: 11 (ref 5–15)
BUN: 11 mg/dL (ref 6–20)
CO2: 23 mmol/L (ref 22–32)
Calcium: 9.2 mg/dL (ref 8.9–10.3)
Chloride: 102 mmol/L (ref 98–111)
Creatinine, Ser: 0.96 mg/dL (ref 0.44–1.00)
GFR, Estimated: >60 mL/min (ref >60)
Glucose, Bld: 105 mg/dL — ABNORMAL HIGH (ref 70–99)
Potassium: 3.8 mmol/L (ref 3.5–5.1)
Sodium: 136 mmol/L (ref 135–145)

## 2022-10-03 NOTE — Progress Notes (Signed)
   09/26/22 1151  PAT Phone Screen  Is the patient taking a GLP-1 receptor agonist? No  Do You Have Diabetes? No  Do You Have Hypertension? Yes  Have You Ever Been to the ER for Asthma? No  Have You Taken Oral Steroids in the Past 3 Months? No  Do you Take Phenteramine or any Other Diet Drugs? No  Recent  Lab Work, EKG, CXR? Yes  Where was this test performed? EKG 05/31/22 [facility]  Do you have a history of heart problems? Yes  Cardiologist Name Followed by Dr Warren Lacy following mv repair in 2020, HTN and HLD. Last OV 05/31/22  Have you ever had tests on your heart? Yes  What cardiac tests were performed? Echo  What date/year were cardiac tests completed? 06/22/22 Echo done,  trivial MV regurgitation, stable mital valve repair, EF 60-65%  Results viewable: [facility] Tab  Any Recent Hospitalizations? No  Height 5\' 3"  (1.6 m)  Weight 89.8 kg  Pat Appointment Scheduled (S)  Yes (bmet)   Pt seen by cardiology viz virtual visit 09/28/22. Clearance and aspirin instructions obtained. Reviewed with Dr Cheral Bay, ok for [facility].

## 2022-10-03 NOTE — Anesthesia Preprocedure Evaluation (Signed)
Anesthesia Evaluation  Patient identified by MRN, date of birth, ID band Patient awake    Reviewed: Allergy & Precautions, NPO status , Patient's Chart, lab work & pertinent test results  Airway Mallampati: II  TM Distance: >3 FB Neck ROM: Full    Dental no notable dental hx.    Pulmonary neg COPD, former smoker   Pulmonary exam normal        Cardiovascular hypertension, Normal cardiovascular exam     Neuro/Psych  Headaches  negative psych ROS   GI/Hepatic negative GI ROS, Neg liver ROS,,,  Endo/Other  negative endocrine ROS    Renal/GU negative Renal ROS     Musculoskeletal negative musculoskeletal ROS (+)    Abdominal   Peds  Hematology negative hematology ROS (+)   Anesthesia Other Findings LEFT KNEE PATELLOCHONDRAL LESION  Reproductive/Obstetrics S/p hysterectomy                             Anesthesia Physical Anesthesia Plan  ASA: 2  Anesthesia Plan: General   Post-op Pain Management:    Induction: Intravenous  PONV Risk Score and Plan: 3 and Ondansetron, Dexamethasone, Midazolam and Treatment may vary due to age or medical condition  Airway Management Planned: LMA  Additional Equipment:   Intra-op Plan:   Post-operative Plan: Extubation in OR  Informed Consent: I have reviewed the patients History and Physical, chart, labs and discussed the procedure including the risks, benefits and alternatives for the proposed anesthesia with the patient or authorized representative who has indicated his/her understanding and acceptance.     Dental advisory given  Plan Discussed with: CRNA  Anesthesia Plan Comments: (Right nose ring)       Anesthesia Quick Evaluation

## 2022-10-03 NOTE — Progress Notes (Signed)
       Patient Instructions  The night before surgery:  No food after midnight. ONLY clear liquids after midnight  The day of surgery (if you do NOT have diabetes):  Drink ONE (1) Pre-Surgery Clear Ensure as directed.   This drink was given to you during your hospital  pre-op appointment visit. The pre-op nurse will instruct you on the time to drink the  Pre-Surgery Ensure depending on your surgery time. Finish the drink at the designated time by the pre-op nurse.  Nothing else to drink after completing the  Pre-Surgery Clear Ensure.  The day of surgery (if you have diabetes): Drink ONE (1) Gatorade 2 (G2) as directed. This drink was given to you during your hospital  pre-op appointment visit.  The pre-op nurse will instruct you on the time to drink the   Gatorade 2 (G2) depending on your surgery time. Color of the Gatorade may vary. Red is not allowed. Nothing else to drink after completing the  Gatorade 2 (G2).         If you have questions, please contact your surgeon's office.    Surgical soap given to patient, instructions given, patient verbalized understanding.   

## 2022-10-04 ENCOUNTER — Ambulatory Visit (HOSPITAL_BASED_OUTPATIENT_CLINIC_OR_DEPARTMENT_OTHER): Payer: 59 | Admitting: Anesthesiology

## 2022-10-04 ENCOUNTER — Other Ambulatory Visit: Payer: Self-pay

## 2022-10-04 ENCOUNTER — Encounter (HOSPITAL_BASED_OUTPATIENT_CLINIC_OR_DEPARTMENT_OTHER): Payer: Self-pay | Admitting: Orthopaedic Surgery

## 2022-10-04 ENCOUNTER — Ambulatory Visit (HOSPITAL_BASED_OUTPATIENT_CLINIC_OR_DEPARTMENT_OTHER)
Admission: RE | Admit: 2022-10-04 | Discharge: 2022-10-04 | Disposition: A | Discharge status: Home / Self Care | Payer: 59 | Attending: Orthopaedic Surgery | Admitting: Orthopaedic Surgery

## 2022-10-04 ENCOUNTER — Encounter (HOSPITAL_BASED_OUTPATIENT_CLINIC_OR_DEPARTMENT_OTHER)
Admission: RE | Disposition: A | Discharge status: Home / Self Care | Payer: Self-pay | Source: Home / Self Care | Attending: Orthopaedic Surgery

## 2022-10-04 DIAGNOSIS — M25862 Other specified joint disorders, left knee: Secondary | ICD-10-CM | POA: Insufficient documentation

## 2022-10-04 DIAGNOSIS — F17210 Nicotine dependence, cigarettes, uncomplicated: Secondary | ICD-10-CM | POA: Insufficient documentation

## 2022-10-04 DIAGNOSIS — I10 Essential (primary) hypertension: Secondary | ICD-10-CM | POA: Insufficient documentation

## 2022-10-04 DIAGNOSIS — I341 Nonrheumatic mitral (valve) prolapse: Secondary | ICD-10-CM | POA: Insufficient documentation

## 2022-10-04 DIAGNOSIS — Z79899 Other long term (current) drug therapy: Secondary | ICD-10-CM | POA: Insufficient documentation

## 2022-10-04 DIAGNOSIS — K219 Gastro-esophageal reflux disease without esophagitis: Secondary | ICD-10-CM | POA: Insufficient documentation

## 2022-10-04 DIAGNOSIS — Z01818 Encounter for other preprocedural examination: Secondary | ICD-10-CM

## 2022-10-04 DIAGNOSIS — M1712 Unilateral primary osteoarthritis, left knee: Secondary | ICD-10-CM

## 2022-10-04 DIAGNOSIS — M25362 Other instability, left knee: Secondary | ICD-10-CM

## 2022-10-04 DIAGNOSIS — S8332XA Tear of articular cartilage of left knee, current, initial encounter: Secondary | ICD-10-CM | POA: Diagnosis not present

## 2022-10-04 DIAGNOSIS — E785 Hyperlipidemia, unspecified: Secondary | ICD-10-CM | POA: Insufficient documentation

## 2022-10-04 DIAGNOSIS — F17290 Nicotine dependence, other tobacco product, uncomplicated: Secondary | ICD-10-CM | POA: Insufficient documentation

## 2022-10-04 HISTORY — PX: KNEE ARTHROSCOPY WITH MACI CARTILAGE HARVEST: SHX6875

## 2022-10-04 SURGERY — KNEE ARTHROSCOPY WITH MACI CARTILAGE HARVEST
Anesthesia: General | Site: Knee | Laterality: Left

## 2022-10-04 MED ORDER — ONDANSETRON HCL 4 MG/2ML IJ SOLN
INTRAMUSCULAR | Status: AC
  Filled 2022-10-04: qty 2

## 2022-10-04 MED ORDER — GABAPENTIN 300 MG PO CAPS
300.0000 mg | ORAL_CAPSULE | Freq: Once | ORAL | Status: AC
  Administered 2022-10-04: 300 mg via ORAL

## 2022-10-04 MED ORDER — MIDAZOLAM HCL 5 MG/5ML IJ SOLN
INTRAMUSCULAR | Status: DC | PRN
  Administered 2022-10-04: 2 mg via INTRAVENOUS

## 2022-10-04 MED ORDER — CEFAZOLIN SODIUM-DEXTROSE 2-4 GM/100ML-% IV SOLN
2.0000 g | INTRAVENOUS | Status: AC
  Administered 2022-10-04: 2 g via INTRAVENOUS

## 2022-10-04 MED ORDER — CEFAZOLIN SODIUM-DEXTROSE 2-4 GM/100ML-% IV SOLN
INTRAVENOUS | Status: AC
  Filled 2022-10-04: qty 100

## 2022-10-04 MED ORDER — FENTANYL CITRATE (PF) 100 MCG/2ML IJ SOLN
INTRAMUSCULAR | Status: AC
  Filled 2022-10-04: qty 2

## 2022-10-04 MED ORDER — PROPOFOL 500 MG/50ML IV EMUL
INTRAVENOUS | Status: AC
  Filled 2022-10-04: qty 50

## 2022-10-04 MED ORDER — AMISULPRIDE (ANTIEMETIC) 5 MG/2ML IV SOLN: 10.0000 mg | Freq: Once | INTRAVENOUS | Status: DC | PRN

## 2022-10-04 MED ORDER — BUPIVACAINE HCL (PF) 0.25 % IJ SOLN
INTRAMUSCULAR | Status: DC | PRN
  Administered 2022-10-04: 20 mL

## 2022-10-04 MED ORDER — SODIUM CHLORIDE 0.9 % IR SOLN
Status: DC | PRN
  Administered 2022-10-04: 3000 mL

## 2022-10-04 MED ORDER — LIDOCAINE 2% (20 MG/ML) 5 ML SYRINGE
INTRAMUSCULAR | Status: AC
  Filled 2022-10-04: qty 5

## 2022-10-04 MED ORDER — ONDANSETRON HCL 4 MG/2ML IJ SOLN
INTRAMUSCULAR | Status: DC | PRN
  Administered 2022-10-04: 4 mg via INTRAVENOUS

## 2022-10-04 MED ORDER — LACTATED RINGERS IV SOLN: INTRAVENOUS | Status: DC

## 2022-10-04 MED ORDER — OXYCODONE HCL 5 MG/5ML PO SOLN: 5.0000 mg | Freq: Once | ORAL | Status: DC | PRN

## 2022-10-04 MED ORDER — BUPIVACAINE HCL (PF) 0.25 % IJ SOLN
INTRAMUSCULAR | Status: AC
  Filled 2022-10-04: qty 60

## 2022-10-04 MED ORDER — KETOROLAC TROMETHAMINE 30 MG/ML IJ SOLN
30.0000 mg | Freq: Once | INTRAMUSCULAR | Status: AC | PRN
  Administered 2022-10-04: 30 mg via INTRAVENOUS

## 2022-10-04 MED ORDER — PROPOFOL 10 MG/ML IV BOLUS
INTRAVENOUS | Status: DC | PRN
  Administered 2022-10-04: 200 mg via INTRAVENOUS

## 2022-10-04 MED ORDER — DEXAMETHASONE SODIUM PHOSPHATE 4 MG/ML IJ SOLN
INTRAMUSCULAR | Status: DC | PRN
  Administered 2022-10-04: 10 mg via INTRAVENOUS

## 2022-10-04 MED ORDER — OXYCODONE HCL 5 MG PO TABS: 5.0000 mg | ORAL_TABLET | Freq: Once | ORAL | Status: DC | PRN

## 2022-10-04 MED ORDER — ACETAMINOPHEN 500 MG PO TABS
ORAL_TABLET | ORAL | Status: AC
  Filled 2022-10-04: qty 2

## 2022-10-04 MED ORDER — KETOROLAC TROMETHAMINE 30 MG/ML IJ SOLN
INTRAMUSCULAR | Status: AC
  Filled 2022-10-04: qty 1

## 2022-10-04 MED ORDER — LIDOCAINE HCL (CARDIAC) PF 100 MG/5ML IV SOSY
PREFILLED_SYRINGE | INTRAVENOUS | Status: DC | PRN
  Administered 2022-10-04: 100 mg via INTRAVENOUS

## 2022-10-04 MED ORDER — VANCOMYCIN HCL 1000 MG IV SOLR
INTRAVENOUS | Status: AC
  Filled 2022-10-04: qty 20

## 2022-10-04 MED ORDER — FENTANYL CITRATE (PF) 100 MCG/2ML IJ SOLN
25.0000 ug | INTRAMUSCULAR | Status: DC | PRN
  Administered 2022-10-04 (×2): 50 ug via INTRAVENOUS

## 2022-10-04 MED ORDER — PROMETHAZINE HCL 25 MG/ML IJ SOLN: 6.2500 mg | INTRAMUSCULAR | Status: DC | PRN

## 2022-10-04 MED ORDER — ACETAMINOPHEN 500 MG PO TABS
1000.0000 mg | ORAL_TABLET | Freq: Once | ORAL | Status: AC
  Administered 2022-10-04: 1000 mg via ORAL

## 2022-10-04 MED ORDER — MIDAZOLAM HCL 2 MG/2ML IJ SOLN
INTRAMUSCULAR | Status: AC
  Filled 2022-10-04: qty 2

## 2022-10-04 MED ORDER — FENTANYL CITRATE (PF) 100 MCG/2ML IJ SOLN
INTRAMUSCULAR | Status: DC | PRN
  Administered 2022-10-04 (×2): 50 ug via INTRAVENOUS

## 2022-10-04 MED ORDER — TRANEXAMIC ACID-NACL 1000-0.7 MG/100ML-% IV SOLN
1000.0000 mg | INTRAVENOUS | Status: AC
  Administered 2022-10-04: 1000 mg via INTRAVENOUS

## 2022-10-04 MED ORDER — TRANEXAMIC ACID-NACL 1000-0.7 MG/100ML-% IV SOLN
INTRAVENOUS | Status: AC
  Filled 2022-10-04: qty 100

## 2022-10-04 MED ORDER — GABAPENTIN 300 MG PO CAPS
ORAL_CAPSULE | ORAL | Status: AC
  Filled 2022-10-04: qty 1

## 2022-10-04 MED ORDER — DEXAMETHASONE SODIUM PHOSPHATE 10 MG/ML IJ SOLN
INTRAMUSCULAR | Status: AC
  Filled 2022-10-04: qty 1

## 2022-10-04 SURGICAL SUPPLY — 56 items
APL PRP STRL LF DISP 70% ISPRP (MISCELLANEOUS) ×2
BANDAGE ESMARK 6X9 LF (GAUZE/BANDAGES/DRESSINGS) IMPLANT
BLADE EXCALIBUR 4.0X13 (MISCELLANEOUS) IMPLANT
BLADE SURG 15 STRL LF DISP TIS (BLADE) IMPLANT
BLADE SURG 15 STRL SS (BLADE)
BNDG CMPR 5X62 HK CLSR LF (GAUZE/BANDAGES/DRESSINGS) ×1
BNDG CMPR 6"X 5 YARDS HK CLSR (GAUZE/BANDAGES/DRESSINGS) ×1
BNDG CMPR 9X6 STRL LF SNTH (GAUZE/BANDAGES/DRESSINGS)
BNDG ELASTIC 4X5.8 VLCR STR LF (GAUZE/BANDAGES/DRESSINGS) ×1 IMPLANT
BNDG ELASTIC 6INX 5YD STR LF (GAUZE/BANDAGES/DRESSINGS) ×1 IMPLANT
BNDG ESMARK 6X9 LF (GAUZE/BANDAGES/DRESSINGS)
CHLORAPREP W/TINT 26 (MISCELLANEOUS) ×1 IMPLANT
COOLER ICEMAN CLASSIC (MISCELLANEOUS) IMPLANT
CUFF TOURN SGL QUICK 34 (TOURNIQUET CUFF) ×1
CUFF TRNQT CYL 34X4.125X (TOURNIQUET CUFF) IMPLANT
DISSECTOR  3.8MM X 13CM (MISCELLANEOUS) ×1
DISSECTOR 3.8MM X 13CM (MISCELLANEOUS) ×1 IMPLANT
DRAPE ARTHROSCOPY W/POUCH 90 (DRAPES) ×1 IMPLANT
DRAPE IMP U-DRAPE 54X76 (DRAPES) ×1 IMPLANT
DRAPE INCISE IOBAN 66X45 STRL (DRAPES) IMPLANT
DRAPE U-SHAPE 47X51 STRL (DRAPES) ×1 IMPLANT
DW OUTFLOW CASSETTE/TUBE SET (MISCELLANEOUS) ×1 IMPLANT
ELECT REM PT RETURN 9FT ADLT (ELECTROSURGICAL)
ELECTRODE REM PT RTRN 9FT ADLT (ELECTROSURGICAL) IMPLANT
EXCALIBUR 3.8MM X 13CM (MISCELLANEOUS) IMPLANT
GAUZE 4X4 16PLY ~~LOC~~+RFID DBL (SPONGE) IMPLANT
GAUZE PAD ABD 8X10 STRL (GAUZE/BANDAGES/DRESSINGS) ×1 IMPLANT
GAUZE SPONGE 4X4 12PLY STRL (GAUZE/BANDAGES/DRESSINGS) ×1 IMPLANT
GAUZE XEROFORM 1X8 LF (GAUZE/BANDAGES/DRESSINGS) ×1 IMPLANT
GLOVE BIO SURGEON STRL SZ 6 (GLOVE) ×1 IMPLANT
GLOVE BIO SURGEON STRL SZ7.5 (GLOVE) ×1 IMPLANT
GLOVE BIOGEL PI IND STRL 6.5 (GLOVE) ×1 IMPLANT
GLOVE BIOGEL PI IND STRL 8 (GLOVE) ×1 IMPLANT
GOWN STRL REUS W/ TWL LRG LVL3 (GOWN DISPOSABLE) ×1 IMPLANT
GOWN STRL REUS W/ TWL XL LVL3 (GOWN DISPOSABLE) ×1 IMPLANT
GOWN STRL REUS W/TWL LRG LVL3 (GOWN DISPOSABLE) ×1
GOWN STRL REUS W/TWL XL LVL3 (GOWN DISPOSABLE) ×1
MANIFOLD NEPTUNE II (INSTRUMENTS) ×1 IMPLANT
NDL HYPO 18GX1.5 BLUNT FILL (NEEDLE) ×1 IMPLANT
NDL SAFETY ECLIP 18X1.5 (MISCELLANEOUS) ×1 IMPLANT
NEEDLE HYPO 18GX1.5 BLUNT FILL (NEEDLE) IMPLANT
PACK ARTHROSCOPY DSU (CUSTOM PROCEDURE TRAY) ×1 IMPLANT
PACK BASIN DAY SURGERY FS (CUSTOM PROCEDURE TRAY) ×1 IMPLANT
PAD COLD SHLDR WRAP-ON (PAD) IMPLANT
PADDING CAST COTTON 6X4 STRL (CAST SUPPLIES) ×1 IMPLANT
PENCIL SMOKE EVACUATOR (MISCELLANEOUS) IMPLANT
PORT APPOLLO RF 90DEGREE MULTI (SURGICAL WAND) ×1 IMPLANT
SLEEVE SCD COMPRESS KNEE MED (STOCKING) ×1 IMPLANT
SUT ETHILON 3 0 PS 1 (SUTURE) ×1 IMPLANT
SUT VIC AB 2-0 SH 27 (SUTURE)
SUT VIC AB 2-0 SH 27XBRD (SUTURE) IMPLANT
SUT VIC AB 3-0 FS2 27 (SUTURE) IMPLANT
SYR 5ML LL (SYRINGE) ×1 IMPLANT
TOWEL GREEN STERILE FF (TOWEL DISPOSABLE) ×2 IMPLANT
TRAY DSU PREP LF (CUSTOM PROCEDURE TRAY) ×1 IMPLANT
TUBING ARTHROSCOPY IRRIG 16FT (MISCELLANEOUS) ×1 IMPLANT

## 2022-10-04 NOTE — Transfer of Care (Signed)
Immediate Anesthesia Transfer of Care Note  Patient: [name]  Procedure(s) Performed: LEFT KNEE ARTHROSCOPY WITH MACI CARTILAGE HARVEST (Left: Knee)  Patient Location: PACU  Anesthesia Type:General  Level of Consciousness: awake  Airway & Oxygen Therapy: Patient Spontanous Breathing and Patient connected to face mask oxygen  Post-op Assessment: Report given to RN and Post -op Vital signs reviewed and stable  Post vital signs: Reviewed and stable  Last Vitals:  Vitals Value Taken Time  BP 95/69 10/04/22 0811  Temp    Pulse 72 10/04/22 0813  Resp 12 10/04/22 0813  SpO2 100 % 10/04/22 0813  Vitals shown include unvalidated device data.  Last Pain:  Vitals:   10/04/22 0633  TempSrc: Oral  PainSc: 2       Patients Stated Pain Goal: 2 ([id number])  Complications: No notable events documented.

## 2022-10-04 NOTE — Discharge Instructions (Addendum)
Discharge Instructions    Attending Surgeon: Vanetta Mulders, MD Office Phone Number: [phone]   Diagnosis and Procedures:    Surgeries Performed: Left knee patellofemoral chondroplasty  Discharge Plan:    Diet: Resume usual diet. Begin with light or bland foods.  Drink plenty of fluids.  Activity:  Left knee activity and motion as tolerated. You are advised to go home directly from the hospital or surgical center. Restrict your activities.  GENERAL INSTRUCTIONS: 1.  Please apply ice to your wound to help with swelling and inflammation. This will improve your comfort and your overall recovery following surgery.     2. Please call Dr. Eddie Dibbles office at [phone] with questions Monday-Friday during business hours. If no one answers, please leave a message and someone should get back to the patient within 24 hours. For emergencies please call 911 or proceed to the emergency room.   3. Patient to notify surgical team if experiences any of the following: Bowel/Bladder dysfunction, uncontrolled pain, nerve/muscle weakness, incision with increased drainage or redness, nausea/vomiting and Fever greater than 101.0 F.  Be alert for signs of infection including redness, streaking, odor, fever or chills. Be alert for excessive pain or bleeding and notify your surgeon immediately.  WOUND INSTRUCTIONS:   Leave your dressing, cast, or splint in place until your post operative visit.  Keep it clean and dry.  Always keep the incision clean and dry until the staples/sutures are removed. If there is no drainage from the incision you should keep it open to air. If there is drainage from the incision you must keep it covered at all times until the drainage stops  Do not soak in a bath tub, hot tub, pool, lake or other body of water until 21 days after your surgery and your incision is completely dry and healed.  If you have removable sutures (or staples) they must be removed 10-14 days  (unless otherwise instructed) from the day of your surgery.     1)  Elevate the extremity as much as possible.  2)  Keep the dressing clean and dry.  3)  Please call us if the dressing becomes wet or dirty.  4)  If you are experiencing worsening pain or worsening swelling, please call.     MEDICATIONS: Resume all previous home medications at the previous prescribed dose and frequency unless otherwise noted Start taking the  pain medications on an as-needed basis as prescribed  Please taper down pain medication over the next week following surgery.  Ideally you should not require a refill of any narcotic pain medication.  Take pain medication with food to minimize nausea. In addition to the prescribed pain medication, you may take over-the-counter pain relievers such as Tylenol.  Do NOT take additional tylenol if your pain medication already has tylenol in it.  Aspirin 325mg  daily for four weeks.      FOLLOWUP INSTRUCTIONS: 1. Follow up at the Physical Therapy Clinic 3-4 days following surgery. This appointment should be scheduled unless other arrangements have been made.The Physical Therapy scheduling number is [phone] if an appointment has not already been arranged.  2. Contact Dr. Eddie Dibbles office during office hours at [phone] or the practice after hours line at [phone] for non-emergencies. For medical emergencies call 911.   Discharge Location: Home   You may have Tylenol again after 12:30pm today, if needed. You may have Ibuprofen/NSAIDS again after 2pm today, if needed.   Post Anesthesia Home Care Instructions  Activity:  Get plenty of rest for the remainder of the day. A responsible individual must stay with you for 24 hours following the procedure.  For the next 24 hours, DO NOT: -Drive a car -Paediatric nurse -Drink alcoholic beverages -Take any medication unless instructed by your physician -Make any legal decisions or sign important  papers.  Meals: Start with liquid foods such as gelatin or soup. Progress to regular foods as tolerated. Avoid greasy, spicy, heavy foods. If nausea and/or vomiting occur, drink only clear liquids until the nausea and/or vomiting subsides. Call your physician if vomiting continues.  Special Instructions/Symptoms: Your throat may feel dry or sore from the anesthesia or the breathing tube placed in your throat during surgery. If this causes discomfort, gargle with warm salt water. The discomfort should disappear within 24 hours.  If you had a scopolamine patch placed behind your ear for the management of post- operative nausea and/or vomiting:  1. The medication in the patch is effective for 72 hours, after which it should be removed.  Wrap patch in a tissue and discard in the trash. Wash hands thoroughly with soap and water. 2. You may remove the patch earlier than 72 hours if you experience unpleasant side effects which may include dry mouth, dizziness or visual disturbances. 3. Avoid touching the patch. Wash your hands with soap and water after contact with the patch.

## 2022-10-04 NOTE — Interval H&P Note (Signed)
History and Physical Interval Note:  10/04/2022 7:10 AM  [name]  has presented today for surgery, with the diagnosis of LEFT KNEE [place].  The various methods of treatment have been discussed with the patient and family. After consideration of risks, benefits and other options for treatment, the patient has consented to  Procedure(s): LEFT KNEE ARTHROSCOPY WITH MACI CARTILAGE HARVEST (Left) as a surgical intervention.  The patient's history has been reviewed, patient examined, no change in status, stable for surgery.  I have reviewed the patient's chart and labs.  Questions were answered to the patient's satisfaction.     Vanetta Mulders

## 2022-10-04 NOTE — Op Note (Signed)
Date of Surgery: 10/04/2022  INDICATIONS: [name] is a 47 y.o.-year-old female with left knee pain in the setting of a chondral lesion involving the patella with persistent medial as well as lateral patellar instability after lateral retinacular release.  The risk and benefits of the procedure were discussed in detail and documented in the pre-operative evaluation.   PREOPERATIVE DIAGNOSIS: 1.  Left knee patellar instability 2.  Left knee chondral lesion measuring 1-1/2 cm wide by 2 cm long  POSTOPERATIVE DIAGNOSIS: Same.  PROCEDURE: 1.  Left knee patella chondroplasty  SURGEON: [name] Pax MD  ASSISTANT: Raynelle Fanning, ATC  ANESTHESIA:  general +25 cc 0.25% Marcaine intra-articular  IV FLUIDS AND URINE: See anesthesia record.  ANTIBIOTICS: Ancef  ESTIMATED BLOOD LOSS: 5 mL.  IMPLANTS:  * No implants in log *  DRAINS: None  CULTURES: None  COMPLICATIONS: none  DESCRIPTION OF PROCEDURE:  Examination under anesthesia: A careful examination under anesthesia was performed.  Knee ROM motion was: -5-135 Lachman: Normal Pivot Shift: Normal Posterior drawer: normal.   Varus stability in full extension: normal.   Varus stability in 30 degrees of flexion: normal.  Valgus stability in full extension: normal.   Valgus stability in 30 degrees of flexion: normal.  Posterolateral drawer: normal There were 4 quadrants of lateral patellar motion as well as medial patellar motion consistent with medial lateral instability   Intra-operative findings: A thorough arthroscopic examination of the knee was performed.  The findings are: 1. Suprapatellar pouch: Normal 2. Undersurface of median ridge: Normal 3. Medial patellar facet: Grade 4 chondral measuring 1 and half by 2 cm 4. Lateral patellar facet: Normal 5. Trochlea: Normal 6. Lateral gutter/popliteus tendon: Normal 7. Hoffa's fat pad: Normal 8. Medial gutter/plica: Normal 9. ACL: Normal 10. PCL: Normal 11. Medial  meniscus: Normal 12. Medial compartment cartilage: Normal 13. Lateral meniscus: Normal 14. Lateral compartment cartilage: Normal  I identified the patient in the pre-operative holding area.  I marked the operative knee with my initials. I reviewed the risks and benefits of the proposed surgical intervention and the patient wished to proceed.  Anesthesia performed a peripheral nerve block.  Patient was subsequently taken back to the operating room.  The patient was transferred to the operative suite and placed in the supine position with all bony prominences padded.     SCDs were placed on the non-operative lower extremity. Appropriate antibiotics was administered within 1 hour before incision. The operative lower extremity was then prepped and draped in standard fashion. A time out was performed confirming the correct extremity, correct patient and correct procedure.    A standard anterolateral portal was made with an 11 blade.  The ideal position for the anteromedial portal was established using a spinal needle.  This AM portal was then created under direct visualization with an 11 blade.  A full diagnostic arthroscopy was then performed, as described above, including probing of the chondral and meniscal surfaces.     At this time the patellofemoral joint was approached.  There was full-thickness chondral loss meeting the medial patellar facet.  [place] Carbo was introduced and this was treated back to healthy bone with abrasion arthroplasty performed over the lesion.  This had a healthy appearing bleeding bed underneath.  At this time ring curette was introduced into the medial compartment and 3 TicTac sized pieces of cartilage were harvested from the nonweightbearing portion of the medial femoral condyle at the osteochondral junction.  That concluded the case.  Skin was closed with  2-0 Vicryl and 3-0 nylon. Xeroform gauze, gauze, Tegaderm, Iceman and brace were applied.  Instrument, sponge, and needle  counts were correct prior to wound closure and at the conclusion of the case.  The patient was taken to the PACU without complication     POSTOPERATIVE PLAN: She will be weightbearing and activity as tolerated.  I will see her back in 2 weeks for further discussion of her patella instability.  She was placed on aspirin for blood clot prevention.  [name] Pax, MD 8:21 AM

## 2022-10-04 NOTE — Anesthesia Postprocedure Evaluation (Signed)
Anesthesia Post Note  Patient: [name]  Procedure(s) Performed: LEFT KNEE ARTHROSCOPY WITH MACI CARTILAGE HARVEST (Left: Knee)     Patient location during evaluation: PACU Anesthesia Type: General Level of consciousness: awake Pain management: pain level controlled Vital Signs Assessment: post-procedure vital signs reviewed and stable Respiratory status: spontaneous breathing, nonlabored ventilation and respiratory function stable Cardiovascular status: blood pressure returned to baseline and stable Postop Assessment: no apparent nausea or vomiting Anesthetic complications: no   No notable events documented.  Last Vitals:  Vitals:   10/04/22 0845 10/04/22 0922  BP: 111/80 117/86  Pulse: 70 67  Resp: 12 18  Temp:  (!) 36.2 C  SpO2: 98% 97%    Last Pain:  Vitals:   10/04/22 0856  TempSrc:   PainSc: 2                  Anasofia Micallef P Shylyn Younce

## 2022-10-05 ENCOUNTER — Encounter (HOSPITAL_BASED_OUTPATIENT_CLINIC_OR_DEPARTMENT_OTHER): Payer: Self-pay | Admitting: Orthopaedic Surgery

## 2022-10-13 ENCOUNTER — Ambulatory Visit: Payer: Medicaid Other | Admitting: Allergy & Immunology

## 2022-10-19 ENCOUNTER — Other Ambulatory Visit (HOSPITAL_BASED_OUTPATIENT_CLINIC_OR_DEPARTMENT_OTHER): Payer: Self-pay

## 2022-10-19 ENCOUNTER — Ambulatory Visit (INDEPENDENT_AMBULATORY_CARE_PROVIDER_SITE_OTHER): Payer: 59 | Admitting: Orthopaedic Surgery

## 2022-10-19 DIAGNOSIS — M25362 Other instability, left knee: Secondary | ICD-10-CM

## 2022-10-19 MED ORDER — ASPIRIN 325 MG PO TBEC
325.0000 mg | DELAYED_RELEASE_TABLET | Freq: Every day | ORAL | 0 refills | Status: DC
  Filled 2022-10-19: qty 30, 30d supply, fill #0

## 2022-10-19 MED ORDER — ACETAMINOPHEN 500 MG PO TABS
500.0000 mg | ORAL_TABLET | Freq: Three times a day (TID) | ORAL | 0 refills | Status: DC
  Filled 2022-10-19 – 2022-12-19 (×2): qty 30, 10d supply, fill #0

## 2022-10-19 MED ORDER — IBUPROFEN 800 MG PO TABS
800.0000 mg | ORAL_TABLET | Freq: Three times a day (TID) | ORAL | 0 refills | Status: DC
  Filled 2022-10-19: qty 30, 10d supply, fill #0

## 2022-10-19 MED ORDER — OXYCODONE HCL 5 MG PO TABS
5.0000 mg | ORAL_TABLET | ORAL | 0 refills | Status: DC | PRN
  Filled 2022-10-19 – 2022-12-19 (×2): qty 20, 4d supply, fill #0

## 2022-10-19 NOTE — Addendum Note (Signed)
Addended by: Benancio Deeds on: 10/19/2022 05:01 PM   Modules accepted: Orders
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
## 2022-10-20 ENCOUNTER — Ambulatory Visit (HOSPITAL_BASED_OUTPATIENT_CLINIC_OR_DEPARTMENT_OTHER): Payer: Self-pay | Admitting: Orthopaedic Surgery

## 2022-10-20 ENCOUNTER — Other Ambulatory Visit (HOSPITAL_BASED_OUTPATIENT_CLINIC_OR_DEPARTMENT_OTHER): Payer: Self-pay | Admitting: Orthopaedic Surgery

## 2022-10-20 ENCOUNTER — Encounter: Payer: Self-pay | Admitting: Internal Medicine

## 2022-10-20 ENCOUNTER — Encounter (HOSPITAL_BASED_OUTPATIENT_CLINIC_OR_DEPARTMENT_OTHER): Payer: Self-pay | Admitting: Orthopaedic Surgery

## 2022-10-20 MED ORDER — MELOXICAM 15 MG PO TABS
15.0000 mg | ORAL_TABLET | Freq: Every day | ORAL | 0 refills | Status: DC
Start: 2022-10-20 — End: 2022-10-27

## 2022-10-27 ENCOUNTER — Ambulatory Visit (INDEPENDENT_AMBULATORY_CARE_PROVIDER_SITE_OTHER): Payer: 59 | Admitting: Internal Medicine

## 2022-10-27 ENCOUNTER — Encounter: Payer: Self-pay | Admitting: Internal Medicine

## 2022-10-27 VITALS — BP 122/80 | HR 78 | Temp 98.2°F | Ht 63.0 in | Wt 200.0 lb

## 2022-10-27 DIAGNOSIS — G43109 Migraine with aura, not intractable, without status migrainosus: Secondary | ICD-10-CM

## 2022-10-27 DIAGNOSIS — H538 Other visual disturbances: Secondary | ICD-10-CM | POA: Insufficient documentation

## 2022-10-27 DIAGNOSIS — E781 Pure hyperglyceridemia: Secondary | ICD-10-CM

## 2022-10-27 DIAGNOSIS — K219 Gastro-esophageal reflux disease without esophagitis: Secondary | ICD-10-CM | POA: Diagnosis not present

## 2022-10-27 DIAGNOSIS — I10 Essential (primary) hypertension: Secondary | ICD-10-CM

## 2022-10-27 DIAGNOSIS — R739 Hyperglycemia, unspecified: Secondary | ICD-10-CM

## 2022-10-27 DIAGNOSIS — E038 Other specified hypothyroidism: Secondary | ICD-10-CM

## 2022-10-27 DIAGNOSIS — G4451 Hemicrania continua: Secondary | ICD-10-CM | POA: Insufficient documentation

## 2022-10-27 LAB — HEPATIC FUNCTION PANEL
ALT: 16 U/L (ref 0–35)
AST: 17 U/L (ref 0–37)
Albumin: 4.2 g/dL (ref 3.5–5.2)
Alkaline Phosphatase: 86 U/L (ref 39–117)
Bilirubin, Direct: 0.1 mg/dL (ref 0.0–0.3)
Total Bilirubin: 0.6 mg/dL (ref 0.2–1.2)
Total Protein: 7.4 g/dL (ref 6.0–8.3)

## 2022-10-27 LAB — CBC WITH DIFFERENTIAL/PLATELET
Basophils Absolute: 0 10*3/uL (ref 0.0–0.1)
Basophils Relative: 0.6 % (ref 0.0–3.0)
Eosinophils Absolute: 0.2 10*3/uL (ref 0.0–0.7)
Eosinophils Relative: 2.4 % (ref 0.0–5.0)
HCT: 42.7 % (ref 36.0–46.0)
Hemoglobin: 14.2 g/dL (ref 12.0–15.0)
Lymphocytes Relative: 20 % (ref 12.0–46.0)
Lymphs Abs: 1.3 10*3/uL (ref 0.7–4.0)
MCHC: 33.3 g/dL (ref 30.0–36.0)
MCV: 90.9 fl (ref 78.0–100.0)
Monocytes Absolute: 0.4 10*3/uL (ref 0.1–1.0)
Monocytes Relative: 7 % (ref 3.0–12.0)
Neutro Abs: 4.4 10*3/uL (ref 1.4–7.7)
Neutrophils Relative %: 70 % (ref 43.0–77.0)
Platelets: 316 10*3/uL (ref 150.0–400.0)
RBC: 4.7 Mil/uL (ref 3.87–5.11)
RDW: 13.7 % (ref 11.5–15.5)
WBC: 6.3 10*3/uL (ref 4.0–10.5)

## 2022-10-27 LAB — LIPID PANEL
Cholesterol: 220 mg/dL — ABNORMAL HIGH (ref 0–200)
HDL: 49 mg/dL (ref >39.00)
LDL Cholesterol: 140 mg/dL — ABNORMAL HIGH (ref 0–99)
NonHDL: 171.29
Total CHOL/HDL Ratio: 4
Triglycerides: 156 mg/dL — ABNORMAL HIGH (ref 0.0–149.0)
VLDL: 31.2 mg/dL (ref 0.0–40.0)

## 2022-10-27 LAB — HEMOGLOBIN A1C: Hgb A1c MFr Bld: 5.7 % (ref 4.6–6.5)

## 2022-10-27 LAB — TSH: TSH: 1.51 u[IU]/mL (ref 0.35–5.50)

## 2022-10-27 MED ORDER — UBRELVY 100 MG PO TABS
100.0000 mg | ORAL_TABLET | Freq: Every day | ORAL | 1 refills | Status: DC | PRN
Start: 2022-10-27 — End: 2024-03-28
  Filled 2023-03-11: qty 30, 30d supply, fill #0

## 2022-10-27 NOTE — Patient Instructions (Signed)

## 2022-10-27 NOTE — Progress Notes (Signed)
Subjective:  Patient ID: [name], female    DOB: 1975/08/20  Age: 47 y.o. MRN: [id number]  CC: Hypertension, Headache, and Hyperlipidemia   HPI [name] presents for f/up -  She complains of a history of migraine headaches but tells me over the last few months the headaches have changed.  She says these headaches are associated with blurred vision.  She said sometimes the headaches are all over and sometimes are isolated to the left eye and temple.  She describes it as a sharp sensation with no nausea, vomiting, paresthesias, slurred speech, or ataxia.  She has about 1 headache per week.  She has a history of Lynch syndrome and wants to undergo a brain scan to see if she has developed a malignancy.  Outpatient Medications Prior to Visit  Medication Sig Dispense Refill   acetaminophen (TYLENOL) 500 MG tablet Take 1 tablet (500 mg total) by mouth every 8 (eight) hours for 10 days. 30 tablet 0   aspirin EC 325 MG tablet Take 1 tablet (325 mg total) by mouth daily. 30 tablet 0   estradiol (VIVELLE-DOT) 0.1 MG/24HR patch Place 1 patch onto the skin 2 (two) times a week.     oxyCODONE (OXY IR/ROXICODONE) 5 MG immediate release tablet Take 1 tablet (5 mg total) by mouth every 4 (four) hours as needed (severe pain). 20 tablet 0   torsemide (DEMADEX) 10 MG tablet Take 3 tablets (30 mg total) by mouth daily. 270 tablet 3   traMADol (ULTRAM) 50 MG tablet Take 1 tablet (50 mg total) by mouth every 12 (twelve) hours as needed. 30 tablet 0   valACYclovir (VALTREX) 500 MG tablet Take 500 mg by mouth as needed.     ibuprofen (ADVIL) 800 MG tablet Take 1 tablet (800 mg total) by mouth every 8 (eight) hours for 10 days. Please take with food, please alternate with acetaminophen 30 tablet 0   meloxicam (MOBIC) 15 MG tablet Take 1 tablet (15 mg total) by mouth daily. 7 tablet 0   oxyCODONE (OXY IR/ROXICODONE) 5 MG immediate release tablet Take 1 tablet (5 mg total) by mouth every 4  (four) hours as needed (severe pain). 5 tablet 0   Potassium Chloride ER 20 MEQ TBCR Take 20 mEq by mouth 2 (two) times daily. 180 tablet 0   No facility-administered medications prior to visit.    ROS Review of Systems  Constitutional: Negative.  Negative for diaphoresis, fatigue and unexpected weight change.  HENT: Negative.    Eyes:  Positive for visual disturbance (BV). Negative for photophobia, pain, redness and itching.  Respiratory: Negative.  Negative for cough, chest tightness, shortness of breath and wheezing.   Cardiovascular:  Negative for chest pain, palpitations and leg swelling.  Gastrointestinal:  Negative for abdominal pain, diarrhea, nausea and vomiting.  Endocrine: Negative.   Genitourinary: Negative.  Negative for difficulty urinating.  Musculoskeletal:  Positive for arthralgias. Negative for back pain, myalgias and neck pain.  Skin: Negative.  Negative for color change and pallor.  Neurological:  Positive for headaches. Negative for dizziness, weakness and numbness.  Hematological:  Negative for adenopathy. Does not bruise/bleed easily.  Psychiatric/Behavioral: Negative.      Objective:  BP 122/80 (BP Location: Left Arm, Patient Position: Sitting, Cuff Size: Large)   Pulse 78   Temp 98.2 F (36.8 C) (Oral)   Ht 5\' 3"  (1.6 m)   Wt 200 lb (90.7 kg)   LMP 01/12/2012   SpO2 98%   BMI  35.43 kg/m   BP Readings from Last 3 Encounters:  10/27/22 122/80  10/04/22 117/86  06/21/22 105/68    Wt Readings from Last 3 Encounters:  10/27/22 200 lb (90.7 kg)  10/04/22 199 lb 1.2 oz (90.3 kg)  06/21/22 189 lb 9.6 oz (86 kg)    Physical Exam Vitals reviewed.  Constitutional:      Appearance: She is well-developed. She is not ill-appearing.  HENT:     Nose: Nose normal.     Mouth/Throat:     Mouth: Mucous membranes are moist.  Eyes:     General: No scleral icterus.    Extraocular Movements: Extraocular movements intact.     Pupils: Pupils are equal, round,  and reactive to light. Pupils are equal.  Cardiovascular:     Rate and Rhythm: Normal rate and regular rhythm.     Heart sounds: No murmur heard.    No friction rub. No gallop.  Pulmonary:     Effort: Pulmonary effort is normal.     Breath sounds: No stridor. No wheezing, rhonchi or rales.  Abdominal:     General: Abdomen is flat.     Palpations: There is no mass.     Tenderness: There is no abdominal tenderness. There is no guarding.     Hernia: No hernia is present.  Musculoskeletal:     Cervical back: Neck supple.  Lymphadenopathy:     Cervical: No cervical adenopathy.  Skin:    General: Skin is warm and dry.     Findings: No rash.  Neurological:     Mental Status: She is alert.     Cranial Nerves: Cranial nerves 2-12 are intact.     Sensory: Sensation is intact.     Motor: Motor function is intact.     Coordination: Coordination is intact.     Gait: Gait is intact.     Deep Tendon Reflexes: Reflexes normal. Babinski sign absent on the right side. Babinski sign absent on the left side.     Reflex Scores:      Tricep reflexes are 0 on the right side and 0 on the left side.      Bicep reflexes are 1+ on the right side and 1+ on the left side.      Brachioradialis reflexes are 1+ on the right side and 1+ on the left side.      Patellar reflexes are 1+ on the right side and 1+ on the left side.      Achilles reflexes are 0 on the right side and 0 on the left side.    Lab Results  Component Value Date   WBC 6.3 10/27/2022   HGB 14.2 10/27/2022   HCT 42.7 10/27/2022   PLT 316.0 10/27/2022   GLUCOSE 105 (H) 10/03/2022   CHOL 220 (H) 10/27/2022   TRIG 156.0 (H) 10/27/2022   HDL 49.00 10/27/2022   LDLCALC 140 (H) 10/27/2022   ALT 16 10/27/2022   AST 17 10/27/2022   NA 136 10/03/2022   K 3.8 10/03/2022   CL 102 10/03/2022   CREATININE 0.96 10/03/2022   BUN 11 10/03/2022   CO2 23 10/03/2022   TSH 1.51 10/27/2022   INR 2.3 02/05/2019   HGBA1C 5.7 10/27/2022    No  results found.  Assessment & Plan:   Essential hypertension- Her blood pressure is well-controlled. -     CBC with Differential/Platelet; Future -     Hepatic function panel; Future  Gastroesophageal reflux disease without  esophagitis -     CBC with Differential/Platelet; Future  Hyperglycemia -     Hemoglobin A1c; Future  Other specified hypothyroidism- She is euthyroid. -     Lipid panel; Future -     TSH; Future  Primary hypertriglyceridemia -     Lipid panel; Future -     Hepatic function panel; Future  Hemicrania continua- MRI ordered to evaluate for mass, bleed, demyelination, NPH, or tumor. -     MR BRAIN WO CONTRAST; Future  Blurred vision, bilateral -     MR BRAIN WO CONTRAST; Future  Migraine with aura and without status migrainosus, not intractable Bernita Raisin; Take 1 tablet (100 mg total) by mouth daily as needed.  Dispense: 30 tablet; Refill: 1     Follow-up: Return in about 3 months (around 01/26/2023).  [name], MD

## 2022-10-28 ENCOUNTER — Other Ambulatory Visit (HOSPITAL_BASED_OUTPATIENT_CLINIC_OR_DEPARTMENT_OTHER): Payer: Self-pay

## 2022-11-01 NOTE — Telephone Encounter (Signed)
Pt is aware she can call [facility] to schedule an appt

## 2022-11-02 ENCOUNTER — Encounter: Payer: Self-pay | Admitting: Internal Medicine

## 2022-11-10 ENCOUNTER — Other Ambulatory Visit: Payer: Self-pay | Admitting: Internal Medicine

## 2022-11-11 ENCOUNTER — Inpatient Hospital Stay: Admission: RE | Admit: 2022-11-11 | Payer: 59 | Source: Ambulatory Visit

## 2022-11-16 ENCOUNTER — Ambulatory Visit
Admission: RE | Admit: 2022-11-16 | Discharge: 2022-11-16 | Disposition: A | Discharge status: Home / Self Care | Payer: 59 | Source: Ambulatory Visit | Attending: Internal Medicine | Admitting: Internal Medicine

## 2022-11-16 DIAGNOSIS — G4451 Hemicrania continua: Secondary | ICD-10-CM

## 2022-11-16 DIAGNOSIS — H538 Other visual disturbances: Secondary | ICD-10-CM

## 2022-12-01 ENCOUNTER — Telehealth: Payer: Self-pay | Admitting: Orthopaedic Surgery

## 2022-12-01 NOTE — Telephone Encounter (Signed)
[employer] forms received. To [employer]. 

## 2022-12-09 ENCOUNTER — Telehealth: Payer: Self-pay | Admitting: Orthopaedic Surgery

## 2022-12-09 NOTE — Telephone Encounter (Signed)
Patient came in to drop off [employer] paperwork and $25 check

## 2022-12-12 ENCOUNTER — Encounter (HOSPITAL_BASED_OUTPATIENT_CLINIC_OR_DEPARTMENT_OTHER): Payer: Self-pay | Admitting: Orthopaedic Surgery

## 2022-12-12 ENCOUNTER — Other Ambulatory Visit: Payer: Self-pay

## 2022-12-12 NOTE — Progress Notes (Signed)
   12/12/22 1039  PAT Phone Screen  Is the patient taking a GLP-1 receptor agonist? No  Do You Have Diabetes? No  Do You Have Hypertension? Yes  Have You Ever Been to the ER for Asthma? No  Have You Taken Oral Steroids in the Past 3 Months? No  Do you Take Phenteramine or any Other Diet Drugs? No  Recent  Lab Work, EKG, CXR? Yes  Where was this test performed? EKG 05/31/22 [facility]  Do you have a history of heart problems? Yes  Cardiologist Name MV repair 2020- followed by Dr [place] for HTN and HLD last OV 05/31/22  Have you ever had tests on your heart? Yes  What cardiac tests were performed? Echo  What date/year were cardiac tests completed? last Echo 12/202023 stable MV repair. EF 60-65%  Results viewable: [facility] Tab  Any Recent Hospitalizations? No  Height 5\' 1"  (1.549 m)  Weight 86.6 kg  Pat Appointment Scheduled Yes (bmet)   Previously reviewed pt's hx with Dr Isaias Cowman- ok for [facility]

## 2022-12-13 ENCOUNTER — Encounter (HOSPITAL_BASED_OUTPATIENT_CLINIC_OR_DEPARTMENT_OTHER): Payer: Self-pay | Admitting: Orthopaedic Surgery

## 2022-12-14 ENCOUNTER — Ambulatory Visit (HOSPITAL_BASED_OUTPATIENT_CLINIC_OR_DEPARTMENT_OTHER): Payer: Self-pay | Admitting: Orthopaedic Surgery

## 2022-12-14 ENCOUNTER — Encounter (HOSPITAL_BASED_OUTPATIENT_CLINIC_OR_DEPARTMENT_OTHER)
Admission: RE | Admit: 2022-12-14 | Discharge: 2022-12-14 | Disposition: A | Discharge status: Home / Self Care | Payer: 59 | Source: Ambulatory Visit | Attending: Orthopaedic Surgery | Admitting: Orthopaedic Surgery

## 2022-12-14 DIAGNOSIS — M25362 Other instability, left knee: Secondary | ICD-10-CM

## 2022-12-14 DIAGNOSIS — Z01812 Encounter for preprocedural laboratory examination: Secondary | ICD-10-CM | POA: Diagnosis present

## 2022-12-14 LAB — BASIC METABOLIC PANEL
Anion gap: 16 — ABNORMAL HIGH (ref 5–15)
BUN: 12 mg/dL (ref 6–20)
CO2: 29 mmol/L (ref 22–32)
Calcium: 9.4 mg/dL (ref 8.9–10.3)
Chloride: 92 mmol/L — ABNORMAL LOW (ref 98–111)
Creatinine, Ser: 1.22 mg/dL — ABNORMAL HIGH (ref 0.44–1.00)
GFR, Estimated: 55 mL/min — ABNORMAL LOW (ref >60)
Glucose, Bld: 118 mg/dL — ABNORMAL HIGH (ref 70–99)
Potassium: 2.9 mmol/L — ABNORMAL LOW (ref 3.5–5.1)
Sodium: 137 mmol/L (ref 135–145)

## 2022-12-14 NOTE — Progress Notes (Signed)

## 2022-12-15 NOTE — Progress Notes (Addendum)
K+ 2.9, Dr. Arby Barrette aware, gave instructions for patient to restart Potassium Chloride Bid. Notified Patient, pt verbalized understanding.   Will recheck BMET day of surgery.

## 2022-12-19 ENCOUNTER — Other Ambulatory Visit (HOSPITAL_BASED_OUTPATIENT_CLINIC_OR_DEPARTMENT_OTHER): Payer: Self-pay

## 2022-12-20 ENCOUNTER — Encounter (HOSPITAL_BASED_OUTPATIENT_CLINIC_OR_DEPARTMENT_OTHER): Payer: Self-pay | Admitting: Orthopaedic Surgery

## 2022-12-20 ENCOUNTER — Ambulatory Visit (HOSPITAL_BASED_OUTPATIENT_CLINIC_OR_DEPARTMENT_OTHER): Payer: 59 | Admitting: Certified Registered"

## 2022-12-20 ENCOUNTER — Encounter (HOSPITAL_BASED_OUTPATIENT_CLINIC_OR_DEPARTMENT_OTHER)
Admission: RE | Disposition: A | Discharge status: Home / Self Care | Payer: Self-pay | Source: Home / Self Care | Attending: Orthopaedic Surgery

## 2022-12-20 ENCOUNTER — Ambulatory Visit (HOSPITAL_COMMUNITY): Payer: 59

## 2022-12-20 ENCOUNTER — Other Ambulatory Visit (HOSPITAL_BASED_OUTPATIENT_CLINIC_OR_DEPARTMENT_OTHER): Payer: Self-pay | Admitting: Orthopaedic Surgery

## 2022-12-20 ENCOUNTER — Other Ambulatory Visit: Payer: Self-pay

## 2022-12-20 ENCOUNTER — Ambulatory Visit (HOSPITAL_BASED_OUTPATIENT_CLINIC_OR_DEPARTMENT_OTHER)
Admission: RE | Admit: 2022-12-20 | Discharge: 2022-12-20 | Disposition: A | Discharge status: Home / Self Care | Payer: 59 | Attending: Orthopaedic Surgery | Admitting: Orthopaedic Surgery

## 2022-12-20 DIAGNOSIS — Z87891 Personal history of nicotine dependence: Secondary | ICD-10-CM | POA: Diagnosis not present

## 2022-12-20 DIAGNOSIS — M25362 Other instability, left knee: Secondary | ICD-10-CM

## 2022-12-20 DIAGNOSIS — E039 Hypothyroidism, unspecified: Secondary | ICD-10-CM

## 2022-12-20 DIAGNOSIS — I10 Essential (primary) hypertension: Secondary | ICD-10-CM

## 2022-12-20 DIAGNOSIS — M898X8 Other specified disorders of bone, other site: Secondary | ICD-10-CM

## 2022-12-20 DIAGNOSIS — M2242 Chondromalacia patellae, left knee: Secondary | ICD-10-CM | POA: Diagnosis present

## 2022-12-20 DIAGNOSIS — Z79899 Other long term (current) drug therapy: Secondary | ICD-10-CM

## 2022-12-20 HISTORY — PX: KNEE ARTHROSCOPY WITH MEDIAL PATELLAR FEMORAL LIGAMENT RECONSTRUCTION: SHX5652

## 2022-12-20 LAB — BASIC METABOLIC PANEL
Anion gap: 11 (ref 5–15)
BUN: 10 mg/dL (ref 6–20)
CO2: 29 mmol/L (ref 22–32)
Calcium: 8.9 mg/dL (ref 8.9–10.3)
Chloride: 100 mmol/L (ref 98–111)
Creatinine, Ser: 1.15 mg/dL — ABNORMAL HIGH (ref 0.44–1.00)
GFR, Estimated: 59 mL/min — ABNORMAL LOW (ref >60)
Glucose, Bld: 134 mg/dL — ABNORMAL HIGH (ref 70–99)
Potassium: 2.8 mmol/L — ABNORMAL LOW (ref 3.5–5.1)
Sodium: 140 mmol/L (ref 135–145)

## 2022-12-20 SURGERY — IMPLANTATION, MATRIX-INDUCED AUTOLOGOUS CHONDROCYTES
Anesthesia: Regional | Site: Knee | Laterality: Left

## 2022-12-20 MED ORDER — THROMBIN 5000 UNITS EX SOLR
CUTANEOUS | Status: AC
  Filled 2022-12-20: qty 5000

## 2022-12-20 MED ORDER — OXYCODONE HCL 5 MG PO TABS
ORAL_TABLET | ORAL | Status: AC
  Filled 2022-12-20: qty 1

## 2022-12-20 MED ORDER — CEFAZOLIN SODIUM-DEXTROSE 2-4 GM/100ML-% IV SOLN
2.0000 g | INTRAVENOUS | Status: AC
  Administered 2022-12-20: 2 g via INTRAVENOUS

## 2022-12-20 MED ORDER — FENTANYL CITRATE (PF) 100 MCG/2ML IJ SOLN
INTRAMUSCULAR | Status: AC
  Filled 2022-12-20: qty 2

## 2022-12-20 MED ORDER — HYDROMORPHONE HCL 1 MG/ML IJ SOLN: 0.5000 mg | INTRAMUSCULAR | Status: DC | PRN

## 2022-12-20 MED ORDER — OXYCODONE HCL 5 MG PO TABS
5.0000 mg | ORAL_TABLET | Freq: Once | ORAL | Status: AC | PRN
  Administered 2022-12-20: 5 mg via ORAL

## 2022-12-20 MED ORDER — DEXAMETHASONE SODIUM PHOSPHATE 10 MG/ML IJ SOLN
INTRAMUSCULAR | Status: DC | PRN
  Administered 2022-12-20: 10 mg via INTRAVENOUS

## 2022-12-20 MED ORDER — OXYCODONE HCL 5 MG/5ML PO SOLN: 5.0000 mg | Freq: Once | ORAL | Status: AC | PRN

## 2022-12-20 MED ORDER — GABAPENTIN 300 MG PO CAPS
ORAL_CAPSULE | ORAL | Status: AC
  Filled 2022-12-20: qty 1

## 2022-12-20 MED ORDER — ACETAMINOPHEN 500 MG PO TABS
1000.0000 mg | ORAL_TABLET | Freq: Once | ORAL | Status: AC
  Administered 2022-12-20: 1000 mg via ORAL

## 2022-12-20 MED ORDER — ONDANSETRON HCL 4 MG/2ML IJ SOLN
INTRAMUSCULAR | Status: DC | PRN
  Administered 2022-12-20: 4 mg via INTRAVENOUS

## 2022-12-20 MED ORDER — PHENYLEPHRINE 80 MCG/ML (10ML) SYRINGE FOR IV PUSH (FOR BLOOD PRESSURE SUPPORT)
PREFILLED_SYRINGE | INTRAVENOUS | Status: AC
  Filled 2022-12-20: qty 10

## 2022-12-20 MED ORDER — TISSEEL 10 ML EX KIT
PACK | CUTANEOUS | Status: DC | PRN
  Administered 2022-12-20: 10 mL via TOPICAL

## 2022-12-20 MED ORDER — VANCOMYCIN HCL 1000 MG IV SOLR
INTRAVENOUS | Status: AC
  Filled 2022-12-20: qty 20

## 2022-12-20 MED ORDER — PROPOFOL 10 MG/ML IV BOLUS
INTRAVENOUS | Status: AC
  Filled 2022-12-20: qty 20

## 2022-12-20 MED ORDER — ONDANSETRON HCL 4 MG/2ML IJ SOLN: 4.0000 mg | Freq: Once | INTRAMUSCULAR | Status: DC | PRN

## 2022-12-20 MED ORDER — ASPIRIN 325 MG PO TBEC: 325.0000 mg | DELAYED_RELEASE_TABLET | Freq: Every day | ORAL | 0 refills | Status: DC

## 2022-12-20 MED ORDER — ACETAMINOPHEN 500 MG PO TABS: 500.0000 mg | ORAL_TABLET | Freq: Three times a day (TID) | ORAL | 0 refills | Status: AC

## 2022-12-20 MED ORDER — OXYCODONE HCL 5 MG PO TABS: 5.0000 mg | ORAL_TABLET | ORAL | 0 refills | Status: DC | PRN

## 2022-12-20 MED ORDER — HYDROMORPHONE HCL 1 MG/ML IJ SOLN
INTRAMUSCULAR | Status: AC
  Filled 2022-12-20: qty 0.5

## 2022-12-20 MED ORDER — CEFAZOLIN SODIUM-DEXTROSE 2-4 GM/100ML-% IV SOLN
INTRAVENOUS | Status: AC
  Filled 2022-12-20: qty 100

## 2022-12-20 MED ORDER — VANCOMYCIN HCL 1000 MG IV SOLR
INTRAVENOUS | Status: DC | PRN
  Administered 2022-12-20: 1000 mg via TOPICAL

## 2022-12-20 MED ORDER — MEPERIDINE HCL 25 MG/ML IJ SOLN: 6.2500 mg | INTRAMUSCULAR | Status: DC | PRN

## 2022-12-20 MED ORDER — ACETAMINOPHEN 160 MG/5ML PO SOLN: 325.0000 mg | ORAL | Status: DC | PRN

## 2022-12-20 MED ORDER — ACETAMINOPHEN 500 MG PO TABS
ORAL_TABLET | ORAL | Status: AC
  Filled 2022-12-20: qty 2

## 2022-12-20 MED ORDER — FENTANYL CITRATE (PF) 100 MCG/2ML IJ SOLN
100.0000 ug | Freq: Once | INTRAMUSCULAR | Status: AC
  Administered 2022-12-20: 100 ug via INTRAVENOUS

## 2022-12-20 MED ORDER — LACTATED RINGERS IV SOLN: INTRAVENOUS | Status: DC

## 2022-12-20 MED ORDER — MIDAZOLAM HCL 2 MG/2ML IJ SOLN
2.0000 mg | Freq: Once | INTRAMUSCULAR | Status: AC
  Administered 2022-12-20: 2 mg via INTRAVENOUS

## 2022-12-20 MED ORDER — TRANEXAMIC ACID-NACL 1000-0.7 MG/100ML-% IV SOLN
INTRAVENOUS | Status: AC
  Filled 2022-12-20: qty 100

## 2022-12-20 MED ORDER — LIDOCAINE 2% (20 MG/ML) 5 ML SYRINGE
INTRAMUSCULAR | Status: AC
  Filled 2022-12-20: qty 5

## 2022-12-20 MED ORDER — 0.9 % SODIUM CHLORIDE (POUR BTL) OPTIME
TOPICAL | Status: DC | PRN
  Administered 2022-12-20: 1000 mL

## 2022-12-20 MED ORDER — MIDAZOLAM HCL 2 MG/2ML IJ SOLN
INTRAMUSCULAR | Status: AC
  Filled 2022-12-20: qty 2

## 2022-12-20 MED ORDER — PHENYLEPHRINE HCL (PRESSORS) 10 MG/ML IV SOLN
INTRAVENOUS | Status: DC | PRN
  Administered 2022-12-20 (×2): 80 ug via INTRAVENOUS

## 2022-12-20 MED ORDER — FENTANYL CITRATE (PF) 100 MCG/2ML IJ SOLN
INTRAMUSCULAR | Status: DC | PRN
  Administered 2022-12-20 (×3): 25 ug via INTRAVENOUS
  Administered 2022-12-20: 50 ug via INTRAVENOUS
  Administered 2022-12-20: 25 ug via INTRAVENOUS

## 2022-12-20 MED ORDER — LIDOCAINE HCL (CARDIAC) PF 100 MG/5ML IV SOSY
PREFILLED_SYRINGE | INTRAVENOUS | Status: DC | PRN
  Administered 2022-12-20: 100 mg via INTRAVENOUS

## 2022-12-20 MED ORDER — TRANEXAMIC ACID-NACL 1000-0.7 MG/100ML-% IV SOLN
1000.0000 mg | INTRAVENOUS | Status: AC
  Administered 2022-12-20: 1000 mg via INTRAVENOUS

## 2022-12-20 MED ORDER — FENTANYL CITRATE (PF) 100 MCG/2ML IJ SOLN
25.0000 ug | INTRAMUSCULAR | Status: DC | PRN
  Administered 2022-12-20 (×2): 50 ug via INTRAVENOUS

## 2022-12-20 MED ORDER — PROPOFOL 10 MG/ML IV BOLUS
INTRAVENOUS | Status: DC | PRN
  Administered 2022-12-20: 160 mg via INTRAVENOUS

## 2022-12-20 MED ORDER — TISSEEL 10 ML EX KIT
PACK | Freq: Once | CUTANEOUS | Status: DC
  Filled 2022-12-20 (×2): qty 1

## 2022-12-20 MED ORDER — GABAPENTIN 300 MG PO CAPS
300.0000 mg | ORAL_CAPSULE | Freq: Once | ORAL | Status: AC
  Administered 2022-12-20: 300 mg via ORAL

## 2022-12-20 MED ORDER — IBUPROFEN 800 MG PO TABS: 800.0000 mg | ORAL_TABLET | Freq: Three times a day (TID) | ORAL | 0 refills | Status: AC

## 2022-12-20 MED ORDER — DEXAMETHASONE SODIUM PHOSPHATE 10 MG/ML IJ SOLN
INTRAMUSCULAR | Status: AC
  Filled 2022-12-20: qty 1

## 2022-12-20 MED ORDER — ONDANSETRON HCL 4 MG/2ML IJ SOLN
INTRAMUSCULAR | Status: AC
  Filled 2022-12-20: qty 2

## 2022-12-20 MED ORDER — ACETAMINOPHEN 325 MG PO TABS: 325.0000 mg | ORAL_TABLET | ORAL | Status: DC | PRN

## 2022-12-20 MED ORDER — ROPIVACAINE HCL 5 MG/ML IJ SOLN
INTRAMUSCULAR | Status: DC | PRN
  Administered 2022-12-20: 20 mL via PERINEURAL

## 2022-12-20 SURGICAL SUPPLY — 97 items
APL PRP STRL LF DISP 70% ISPRP (MISCELLANEOUS) ×1
APL SKNCLS STERI-STRIP NONHPOA (GAUZE/BANDAGES/DRESSINGS)
BANDAGE ESMARK 6X9 LF (GAUZE/BANDAGES/DRESSINGS) IMPLANT
BENZOIN TINCTURE PRP APPL 2/3 (GAUZE/BANDAGES/DRESSINGS) IMPLANT
BIT DRILL 2.5 CANN STRL (BIT) IMPLANT
BLADE AVERAGE 25X9 (BLADE) IMPLANT
BLADE EXCALIBUR 4.0X13 (MISCELLANEOUS) IMPLANT
BLADE MICRO SAGITTAL (BLADE) IMPLANT
BLADE SURG 15 STRL LF DISP TIS (BLADE) ×2 IMPLANT
BLADE SURG 15 STRL SS (BLADE) ×2
BNDG CMPR 5X4 KNIT ELC UNQ LF (GAUZE/BANDAGES/DRESSINGS) ×1
BNDG CMPR 5X62 HK CLSR LF (GAUZE/BANDAGES/DRESSINGS) ×1
BNDG CMPR 6"X 5 YARDS HK CLSR (GAUZE/BANDAGES/DRESSINGS) ×1
BNDG CMPR 9X6 STRL LF SNTH (GAUZE/BANDAGES/DRESSINGS) ×1
BNDG ELASTIC 4INX 5YD STR LF (GAUZE/BANDAGES/DRESSINGS) ×1 IMPLANT
BNDG ELASTIC 6INX 5YD STR LF (GAUZE/BANDAGES/DRESSINGS) ×1 IMPLANT
BNDG ESMARK 6X9 LF (GAUZE/BANDAGES/DRESSINGS) ×1
CHLORAPREP W/TINT 26 (MISCELLANEOUS) ×1 IMPLANT
COOLER ICEMAN CLASSIC (MISCELLANEOUS) ×1 IMPLANT
CUFF TOURN SGL QUICK 34 (TOURNIQUET CUFF) ×1
CUFF TRNQT CYL 34X4.125X (TOURNIQUET CUFF) ×1 IMPLANT
DISSECTOR 3.8MM X 13CM (MISCELLANEOUS) ×1 IMPLANT
DRAPE ARTHROSCOPY W/POUCH 90 (DRAPES) ×1 IMPLANT
DRAPE C-ARM 42X72 X-RAY (DRAPES) IMPLANT
DRAPE C-ARMOR (DRAPES) IMPLANT
DRAPE EXTREMITY T 121X128X90 (DISPOSABLE) ×1 IMPLANT
DRAPE IMP U-DRAPE 54X76 (DRAPES) IMPLANT
DRAPE INCISE IOBAN 66X45 STRL (DRAPES) IMPLANT
DRAPE OEC MINIVIEW 54X84 (DRAPES) ×1 IMPLANT
DRAPE POUCH INSTRU U-SHP 10X18 (DRAPES) ×1 IMPLANT
DRAPE U-SHAPE 47X51 STRL (DRAPES) ×1 IMPLANT
DRSG TEGADERM 4X4.75 (GAUZE/BANDAGES/DRESSINGS) ×3 IMPLANT
DW OUTFLOW CASSETTE/TUBE SET (MISCELLANEOUS) ×1 IMPLANT
ELECT REM PT RETURN 9FT ADLT (ELECTROSURGICAL) ×1
ELECTRODE REM PT RTRN 9FT ADLT (ELECTROSURGICAL) ×1 IMPLANT
EXCALIBUR 3.8MM X 13CM (MISCELLANEOUS) IMPLANT
GAUZE 4X4 16PLY ~~LOC~~+RFID DBL (SPONGE) IMPLANT
GAUZE PAD ABD 8X10 STRL (GAUZE/BANDAGES/DRESSINGS) ×1 IMPLANT
GAUZE SPONGE 4X4 12PLY STRL (GAUZE/BANDAGES/DRESSINGS) ×1 IMPLANT
GAUZE XEROFORM 1X8 LF (GAUZE/BANDAGES/DRESSINGS) ×1 IMPLANT
GLOVE BIO SURGEON STRL SZ 6 (GLOVE) ×1 IMPLANT
GLOVE BIO SURGEON STRL SZ7.5 (GLOVE) ×2 IMPLANT
GLOVE BIOGEL PI IND STRL 6.5 (GLOVE) ×1 IMPLANT
GLOVE BIOGEL PI IND STRL 8 (GLOVE) ×1 IMPLANT
GOWN STRL REUS W/ TWL LRG LVL3 (GOWN DISPOSABLE) ×1 IMPLANT
GOWN STRL REUS W/ TWL XL LVL3 (GOWN DISPOSABLE) ×1 IMPLANT
GOWN STRL REUS W/TWL LRG LVL3 (GOWN DISPOSABLE) ×1
GOWN STRL REUS W/TWL XL LVL3 (GOWN DISPOSABLE) ×1
GUIDE WIRE 1.6MM (WIRE) ×1
GUIDEWIRE 1.6MM (WIRE) IMPLANT
IMMOBILIZER KNEE 24 THIGH 36 (MISCELLANEOUS) IMPLANT
IMMOBILIZER KNEE 24 UNIV (MISCELLANEOUS) IMPLANT
KIT TRANSTIBIAL (DISPOSABLE) ×1 IMPLANT
MACI AUTOLOGOUS CELL SCAFFOLD (Tissue) ×1 IMPLANT
MANIFOLD NEPTUNE II (INSTRUMENTS) ×1 IMPLANT
NDL HYPO 18GX1.5 BLUNT FILL (NEEDLE) IMPLANT
NDL SAFETY ECLIP 18X1.5 (MISCELLANEOUS) IMPLANT
NDL SUT 6 .5 CRC .975X.05 MAYO (NEEDLE) IMPLANT
NEEDLE HYPO 18GX1.5 BLUNT FILL (NEEDLE) IMPLANT
NEEDLE MAYO TAPER (NEEDLE)
PACK ARTHROSCOPY DSU (CUSTOM PROCEDURE TRAY) ×1 IMPLANT
PACK BASIN DAY SURGERY FS (CUSTOM PROCEDURE TRAY) ×1 IMPLANT
PAD COLD SHLDR WRAP-ON (PAD) ×1 IMPLANT
PADDING CAST COTTON 6X4 STRL (CAST SUPPLIES) ×1 IMPLANT
PENCIL SMOKE EVACUATOR (MISCELLANEOUS) ×1 IMPLANT
PORT APPOLLO RF 90DEGREE MULTI (SURGICAL WAND) ×1 IMPLANT
SCAFFOLD CELL AUTOLOGOUS MACI (Tissue) IMPLANT
SCREW BONE 3.5X42MM CORTICAL (Screw) IMPLANT
SCREW CORT 3.5X40 LP ANKLE (Screw) IMPLANT
SLEEVE SCD COMPRESS KNEE MED (STOCKING) ×1 IMPLANT
SPONGE INTESTINAL PEANUT (DISPOSABLE) ×1 IMPLANT
SPONGE T-LAP 18X18 ~~LOC~~+RFID (SPONGE) ×2 IMPLANT
SPONGE T-LAP 4X18 ~~LOC~~+RFID (SPONGE) ×1 IMPLANT
STRIP CLOSURE SKIN 1/2X4 (GAUZE/BANDAGES/DRESSINGS) IMPLANT
SUCTION TUBE FRAZIER 10FR DISP (SUCTIONS) ×1 IMPLANT
SUT 0 FIBERLOOP 38 BLUE TPR ND (SUTURE)
SUT CHROMIC 3 0 SH 27 (SUTURE) ×1 IMPLANT
SUT ETHILON 3 0 PS 1 (SUTURE) ×1 IMPLANT
SUT FIBERWIRE #2 38 T-5 BLUE (SUTURE) ×2
SUT MNCRL AB 3-0 PS2 27 (SUTURE) ×1 IMPLANT
SUT VIC AB 0 CT1 27 (SUTURE) ×2
SUT VIC AB 0 CT1 27XBRD ANBCTR (SUTURE) ×1 IMPLANT
SUT VIC AB 2-0 CT1 27 (SUTURE) ×2
SUT VIC AB 2-0 CT1 TAPERPNT 27 (SUTURE) ×1 IMPLANT
SUT VIC AB 2-0 SH 27 (SUTURE) ×1
SUT VIC AB 2-0 SH 27XBRD (SUTURE) ×1 IMPLANT
SUT VIC AB 3-0 FS2 27 (SUTURE) IMPLANT
SUT VIC AB 3-0 SH 27 (SUTURE)
SUT VIC AB 3-0 SH 27X BRD (SUTURE) IMPLANT
SUTURE 0 FIBERLP 38 BLU TPR ND (SUTURE) IMPLANT
SUTURE FIBERWR #2 38 T-5 BLUE (SUTURE) ×1 IMPLANT
SUTURE TAPE 1.3 FIBERLOP 20 ST (SUTURE) ×1 IMPLANT
SUTURETAPE 1.3 FIBERLOOP 20 ST (SUTURE)
SYR 5ML LL (SYRINGE) ×1 IMPLANT
TOWEL GREEN STERILE FF (TOWEL DISPOSABLE) ×2 IMPLANT
TUBING ARTHROSCOPY IRRIG 16FT (MISCELLANEOUS) ×1 IMPLANT
YANKAUER SUCT BULB TIP NO VENT (SUCTIONS) ×1 IMPLANT

## 2022-12-20 NOTE — Anesthesia Procedure Notes (Signed)
Anesthesia Regional Block: Adductor canal block   Pre-Anesthetic Checklist: , timeout performed,  Correct Patient, Correct Site, Correct Laterality,  Correct Procedure, Correct Position, site marked,  Risks and benefits discussed,  Surgical consent,  Pre-op evaluation,  At surgeon's request and post-op pain management  Laterality: Left  Prep: chloraprep       Needles:  Injection technique: Single-shot  Needle Type: Echogenic Stimulator Needle     Needle Length: 5cm  Needle Gauge: 22     Additional Needles:   Procedures:,,,, ultrasound used (permanent image in chart),,    Narrative:  Start time: 12/20/2022 8:34 AM End time: 12/20/2022 8:44 AM Injection made incrementally with aspirations every 5 mL.  Performed by: Personally  Anesthesiologist: Heather Roberts, MD  Additional Notes: Functioning IV was confirmed and monitors were applied.  A 50mm 22ga Arrow echogenic stimulator needle was used. Sterile prep and drape,hand hygiene and sterile gloves were used. Ultrasound guidance: relevant anatomy identified, needle position confirmed, local anesthetic spread visualized around nerve(s)., vascular puncture avoided.  Image printed for medical record. Negative aspiration and negative test dose prior to incremental administration of local anesthetic. The patient tolerated the procedure well.

## 2022-12-20 NOTE — Anesthesia Postprocedure Evaluation (Signed)
Anesthesia Post Note  Patient: KADE DEATRICK  Procedure(s) Performed: LEFT KNEE ARTHROSCOPY WITH IMPLANTATION OF MATRIX-INDUCED AUTOLOGOUS CHONDROCYTE (MACI) / TIBIAL TUBRICLE OSTEOTOMY/ AND MEDIAL PATELLOFEMORAL LIGAMENT RECONSTRUCTION (Left: Knee) KNEE ARTHROSCOPY WITH MEDIAL PATELLAR FEMORAL LIGAMENT RECONSTRUCTION (Left: Knee)     Patient location during evaluation: PACU Anesthesia Type: Regional and General Level of consciousness: awake and alert Pain management: pain level controlled Vital Signs Assessment: post-procedure vital signs reviewed and stable Respiratory status: spontaneous breathing, nonlabored ventilation, respiratory function stable and patient connected to nasal cannula oxygen Cardiovascular status: blood pressure returned to baseline and stable Postop Assessment: no apparent nausea or vomiting Anesthetic complications: no  No notable events documented.  Last Vitals:  Vitals:   12/20/22 1300 12/20/22 1331  BP: 114/85 138/70  Pulse: 77 88  Resp: 13 15  Temp:  (!) 36.2 C  SpO2: 96% 98%    Last Pain:  Vitals:   12/20/22 1331  TempSrc: Temporal  PainSc:                  Thelonious Kauffmann

## 2022-12-20 NOTE — Interval H&P Note (Signed)
History and Physical Interval Note:  12/20/2022 9:25 AM  [name]  has presented today for surgery, with the diagnosis of LEFT PATELLOCHONDRAL LESION.  The various methods of treatment have been discussed with the patient and family. After consideration of risks, benefits and other options for treatment, the patient has consented to  Procedure(s): LEFT KNEE ARTHROSCOPY WITH IMPLANTATION OF MATRIX-INDUCED AUTOLOGOUS CHONDROCYTE (MACI) / TIBIAL TUBRICLE OSTEOTOMY/ AND MEDIAL PATELLOFEMORAL LIGAMENT RECONSTRUCTION (Left) KNEE ARTHROSCOPY WITH MEDIAL PATELLAR FEMORAL LIGAMENT RECONSTRUCTION (Left) as a surgical intervention.  The patient's history has been reviewed, patient examined, no change in status, stable for surgery.  I have reviewed the patient's chart and labs.  Questions were answered to the patient's satisfaction.     [name]
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
## 2022-12-20 NOTE — Anesthesia Procedure Notes (Signed)
Procedure Name: LMA Insertion Date/Time: 12/20/2022 10:05 AM  Performed by: Lauralyn Primes, CRNAPre-anesthesia Checklist: Patient identified, Emergency Drugs available, Suction available and Patient being monitored Patient Re-evaluated:Patient Re-evaluated prior to induction Oxygen Delivery Method: Circle system utilized Preoxygenation: Pre-oxygenation with 100% oxygen Induction Type: IV induction Ventilation: Mask ventilation without difficulty LMA: LMA inserted LMA Size: 4.0 Number of attempts: 1 Airway Equipment and Method: Bite block Placement Confirmation: positive ETCO2 Tube secured with: Tape Dental Injury: Teeth and Oropharynx as per pre-operative assessment

## 2022-12-20 NOTE — Transfer of Care (Signed)
Immediate Anesthesia Transfer of Care Note  Patient: [name]  Procedure(s) Performed: LEFT KNEE ARTHROSCOPY WITH IMPLANTATION OF MATRIX-INDUCED AUTOLOGOUS CHONDROCYTE (MACI) / TIBIAL TUBRICLE OSTEOTOMY/ AND MEDIAL PATELLOFEMORAL LIGAMENT RECONSTRUCTION (Left: Knee) KNEE ARTHROSCOPY WITH MEDIAL PATELLAR FEMORAL LIGAMENT RECONSTRUCTION (Left: Knee)  Patient Location: PACU  Anesthesia Type:GA combined with regional for post-op pain  Level of Consciousness: drowsy  Airway & Oxygen Therapy: Patient Spontanous Breathing and Patient connected to face mask oxygen  Post-op Assessment: Report given to RN and Post -op Vital signs reviewed and stable  Post vital signs: Reviewed and stable  Last Vitals:  Vitals Value Taken Time  BP 120/84 12/20/22 1204  Temp    Pulse 86 12/20/22 1205  Resp 7 12/20/22 1205  SpO2 99 % 12/20/22 1205  Vitals shown include unvalidated device data.  Last Pain:  Vitals:   12/20/22 0811  TempSrc: Temporal  PainSc: 0-No pain         Complications: No notable events documented.

## 2022-12-20 NOTE — Anesthesia Preprocedure Evaluation (Signed)
Anesthesia Evaluation  Patient identified by MRN, date of birth, ID band Patient awake    Reviewed: Allergy & Precautions, NPO status , Patient's Chart, lab work & pertinent test results  Airway Mallampati: II  TM Distance: >3 FB Neck ROM: Full    Dental no notable dental hx. (+) Teeth Intact, Dental Advisory Given   Pulmonary neg COPD, former smoker   Pulmonary exam normal        Cardiovascular hypertension, Normal cardiovascular exam     Neuro/Psych  Headaches  negative psych ROS   GI/Hepatic negative GI ROS, Neg liver ROS,,,  Endo/Other  Hypothyroidism    Renal/GU negative Renal ROS     Musculoskeletal  (+) Arthritis , Osteoarthritis,    Abdominal   Peds  Hematology negative hematology ROS (+)   Anesthesia Other Findings   Reproductive/Obstetrics S/p hysterectomy                             Anesthesia Physical Anesthesia Plan  ASA: 2  Anesthesia Plan: General and Regional   Post-op Pain Management: Regional block*   Induction: Intravenous  PONV Risk Score and Plan: 3 and Ondansetron, Dexamethasone, Midazolam and Treatment may vary due to age or medical condition  Airway Management Planned: LMA  Additional Equipment: None  Intra-op Plan:   Post-operative Plan: Extubation in OR  Informed Consent: I have reviewed the patients History and Physical, chart, labs and discussed the procedure including the risks, benefits and alternatives for the proposed anesthesia with the patient or authorized representative who has indicated his/her understanding and acceptance.     Dental advisory given  Plan Discussed with: CRNA and Anesthesiologist  Anesthesia Plan Comments: (Right nose ring)       Anesthesia Quick Evaluation

## 2022-12-20 NOTE — Op Note (Signed)
Date of Surgery: 12/20/2022  INDICATIONS: Ms. Basom is a 47 y.o.-year-old female with left knee medial patella chondral lesion.  The risk and benefits of the procedure were discussed in detail and documented in the pre-operative evaluation.   PREOPERATIVE DIAGNOSIS: 1.  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  PROCEDURE: 1.  Left knee tibial tubercle osteotomy 2.  Left knee matrix associated autologous chondrocyte implantation medial patella facet  SURGEON: Benancio Deeds MD  ASSISTANT: Kerby Less, ATC  ANESTHESIA:  general plus adductor canal block  IV FLUIDS AND URINE: See anesthesia record.  ANTIBIOTICS: Ancef  ESTIMATED BLOOD LOSS: 50 mL.  IMPLANTS:  Implant Name Type Inv. Item Serial No. Manufacturer Lot No. LRB No. Used Action  MACI AUTOLOGOUS CELL SCAFFOLD - [id number] Tissue MACI [employer] CELL [employer]CAFFOLD [id number] VERICEL [id number] Left 1 Implanted  SCREW CORT 3.5X40 LP ANKLE - [id number] Screw SCREW CORT 3.5X40 LP ANKLE  [employer]  Left 2 Implanted  SCREW BONE 3.5X42MM CORTICAL - [id number] Screw SCREW BONE 3.5X42MM CORTICAL  [employer]  Left 1 Implanted    DRAINS: None  CULTURES: None  COMPLICATIONS: none  DESCRIPTION OF PROCEDURE:  Examination under anesthesia: A careful examination under anesthesia was performed.  Knee ROM motion was: -5 - 135 Lachman: Normal Pivot Shift: Normal Posterior drawer: normal.   Varus stability in full extension: normal.   Varus stability in 30 degrees of flexion: normal.  Valgus stability in full extension: normal.   Valgus stability in 30 degrees of flexion: normal.  Posterolateral drawer: normal   Intra-operative findings: A thorough arthroscopic examination of the knee was performed.  The findings are: 1. Suprapatellar pouch: Normal 2. Undersurface of median ridge: 2.7 cm chondral defect 3. Medial patellar facet: As above 4. Lateral patellar facet: Normal 5.  Trochlea: Normal 6. Lateral gutter/popliteus tendon: Normal 7. Hoffa's fat pad: Normal 8. Medial gutter/plica: Normal 9. ACL: Normal 10. PCL: Normal 11. Medial meniscus: Normal 12. Medial compartment cartilage: Normal 13. Lateral meniscus: Normal 14. Lateral compartment cartilage: Normal 4 quadrants of lateral and medial motion of the patella with the knee in hyperextension   The patient was identified in the preoperative holding area.  The correct site was marked according universal protocol.  She is subsequently taken back to the operating room.  Anesthesia was induced.  Final timeout was again performed.  She was prepped and draped in the usual sterile fashion.  Antibiotics were given 1 hour prior to skin incision.  A 10cm longitudinal incision medial to the tibial tubercle was made and full thickness medial and lateral fasciocutaneous flaps were elevated. Small lateral and medial parapatellar arthrotomies were made and an Army-Navy retractor was placed underneath the patellar tendon for the remainder of the osteotomy.     Attention was turned to the medial chondral lesion.  The patella was everted.  A 2.7 cm centimeters template was impacted into place into the medial patellar facet around the negative grade 4 chondral loss area.  Curette was used to debride this cartilage down to subchondral bone but not violated.  A layer of fibrin glue was placed and the corresponding cut out was then used to remove a semicircular shape from the chondrocyte implant.  This was then placed cell side facing down over the glue.  A peripheral ring of fibrin glue was placed allowed to dry.  I then elevated the anterior compartment musculature off the anterolateral tibia in a subperiosteal fashion.  This allowed  2 blunt Hohmann retractors to be placed around the posterolateral tibia to protect the neurovascular structures during the osteotomy.  At that point, an osteotomy was planned along the medial tibia in an  oblique fashion from  posterior proximal to distal anterior, and the anticipated path of the osteotomy was marked with a Bovie in the periosteum.  Then, 2 parallel 0.062 K-wires were used to plan the trajectory of the osteotomy, such that through it exited a safe distance from the posterior tibial cortex.  Once these were placed, an oscillating saw was used to create the osteotomy cut in an oblique fashion at 45 degrees. 45 degrees was chosen to optimize both the medialization as well as anteriorization to unload the patellar chondral defect and provide increased stability to the patella. Once this saw blade cut was performed and a distal cortical hinge was left intact, an osteotome was used to make the oblique cuts as well as the transverse cut proximal to the tibial tubercle while protecting the patellar tendon.  This allowed the tubercle to be mobilized.  This osteotomy site was completed in order to remove 5 mm of distal bone in order to distal as the osteotomy.  The osteotomy shingle was then anteromedialized 8 mm, based on preoperative planning.  This was temporarily fixed with a bicortical 0.062 K-wire.  Then, under direct fluoroscopic visualization, fixation was achieved with 3 3.5 mm solid cortical screws using lag by technique. The holes were countersunk and screws of the appropriate depth were inserted sequentially.  This afforded appropriate fixation of the tibial tubercle osteotomy.  Final position of the screws and the osteotomy was confirmed on AP and lateral fluoroscopic images and was found to be in excellent position.     At this point, I directed my attention to medial arthrotomy.  A #2 FiberWire was used to imbricate this as there was significant lateral mobility.  Following capsular closure the medial lateral mobility of the patella was much better with 2 quadrants medially and laterally.   There was excellent hemostasis throughout.  The wound was irrigated with 1 L of saline.  The main  incision was then closed in layers with 0 Vicryl 2-0 Vicryl, 3-0 nylon.  Repeat EUA showed central patellar tracking, with a stable patella throughout full range of motion.  Full extension and 135 of flexion were preserved.  A sterile dressing was applied followed by a Iceman device and a hinged knee brace locked in full extension.   The patient awoke from anesthesia without difficulty and was transferred to the PACU in stable condition.     POSTOPERATIVE PLAN: She will be touchdown weightbearing for a total of 2 weeks.  She will be locked straight in a hinged knee brace.  I will see her back in 2 weeks for suture removal.  She will begin PT with TTO and Maci protocol  Benancio Deeds, MD 11:49 AM

## 2022-12-20 NOTE — Discharge Instructions (Addendum)
Discharge Instructions    Attending Surgeon: [name], MD Office Phone Number: [phone]   Diagnosis and Procedures:    Surgeries Performed: Left knee tibial osteotomy with MACI cartilage placement  Discharge Plan:    Diet: Resume usual diet. Begin with light or bland foods.  Drink plenty of fluids.  Activity:  Touchdown weight bearing with brace locked in extension. You are advised to go home directly from the hospital or surgical center. Restrict your activities.  GENERAL INSTRUCTIONS: 1.  Please apply ice to your wound to help with swelling and inflammation. This will improve your comfort and your overall recovery following surgery.     2. Please call Dr. Serena Croissant office at [phone] with questions Monday-Friday during business hours. If no one answers, please leave a message and someone should get back to the patient within 24 hours. For emergencies please call 911 or proceed to the emergency room.   3. Patient to notify surgical team if experiences any of the following: Bowel/Bladder dysfunction, uncontrolled pain, nerve/muscle weakness, incision with increased drainage or redness, nausea/vomiting and Fever greater than 101.0 F.  Be alert for signs of infection including redness, streaking, odor, fever or chills. Be alert for excessive pain or bleeding and notify your surgeon immediately.  WOUND INSTRUCTIONS:   Leave your dressing, cast, or splint in place until your post operative visit.  Keep it clean and dry.  Always keep the incision clean and dry until the staples/sutures are removed. If there is no drainage from the incision you should keep it open to air. If there is drainage from the incision you must keep it covered at all times until the drainage stops  Do not soak in a bath tub, hot tub, pool, lake or other body of water until 21 days after your surgery and your incision is completely dry and healed.  If you have removable sutures (or staples)  they must be removed 10-14 days (unless otherwise instructed) from the day of your surgery.     1)  Elevate the extremity as much as possible.  2)  Keep the dressing clean and dry.  3)  Please call us if the dressing becomes wet or dirty.  4)  If you are experiencing worsening pain or worsening swelling, please call.     MEDICATIONS: Resume all previous home medications at the previous prescribed dose and frequency unless otherwise noted Start taking the  pain medications on an as-needed basis as prescribed  Please taper down pain medication over the next week following surgery.  Ideally you should not require a refill of any narcotic pain medication.  Take pain medication with food to minimize nausea. In addition to the prescribed pain medication, you may take over-the-counter pain relievers such as Tylenol.  Do NOT take additional tylenol if your pain medication already has tylenol in it.  Aspirin 325mg  daily for four weeks.      FOLLOWUP INSTRUCTIONS: 1. Follow up at the Physical Therapy Clinic 3-4 days following surgery. This appointment should be scheduled unless other arrangements have been made.The Physical Therapy scheduling number is [phone] if an appointment has not already been arranged.  2. Contact Dr. Serena Croissant office during office hours at [phone] or the practice after hours line at [phone] for non-emergencies. For medical emergencies call 911.   Discharge Location: Home    Post Anesthesia Home Care Instructions  Activity: Get plenty of rest for the remainder of the day. A responsible individual must stay with  you for 24 hours following the procedure.  For the next 24 hours, DO NOT: -Drive a car -Advertising copywriter -Drink alcoholic beverages -Take any medication unless instructed by your physician -Make any legal decisions or sign important papers.  Meals: Start with liquid foods such as gelatin or soup. Progress to regular foods as tolerated.  Avoid greasy, spicy, heavy foods. If nausea and/or vomiting occur, drink only clear liquids until the nausea and/or vomiting subsides. Call your physician if vomiting continues.  Special Instructions/Symptoms: Your throat may feel dry or sore from the anesthesia or the breathing tube placed in your throat during surgery. If this causes discomfort, gargle with warm salt water. The discomfort should disappear within 24 hours.    Regional Anesthesia Blocks  1. Numbness or the inability to move the "blocked" extremity may last from 3-48 hours after placement. The length of time depends on the medication injected and your individual response to the medication. If the numbness is not going away after 48 hours, call your surgeon.  2. The extremity that is blocked will need to be protected until the numbness is gone and the  Strength has returned. Because you cannot feel it, you will need to take extra care to avoid injury. Because it may be weak, you may have difficulty moving it or using it. You may not know what position it is in without looking at it while the block is in effect.  3. For blocks in the legs and feet, returning to weight bearing and walking needs to be done carefully. You will need to wait until the numbness is entirely gone and the strength has returned. You should be able to move your leg and foot normally before you try and bear weight or walk. You will need someone to be with you when you first try to ensure you do not fall and possibly risk injury.  4. Bruising and tenderness at the needle site are common side effects and will resolve in a few days.  5. Persistent numbness or new problems with movement should be communicated to the surgeon or the [facility] [phone] [facility] [phone]).  Regional Anesthesia Blocks  1. Numbness or the inability to move the "blocked" extremity may last from 3-48 hours after placement. The length of time  depends on the medication injected and your individual response to the medication. If the numbness is not going away after 48 hours, call your surgeon.  2. The extremity that is blocked will need to be protected until the numbness is gone and the  Strength has returned. Because you cannot feel it, you will need to take extra care to avoid injury. Because it may be weak, you may have difficulty moving it or using it. You may not know what position it is in without looking at it while the block is in effect.  3. For blocks in the legs and feet, returning to weight bearing and walking needs to be done carefully. You will need to wait until the numbness is entirely gone and the strength has returned. You should be able to move your leg and foot normally before you try and bear weight or walk. You will need someone to be with you when you first try to ensure you do not fall and possibly risk injury.  4. Bruising and tenderness at the needle site are common side effects and will resolve in a few days.  5. Persistent numbness or new problems with movement should  be communicated to the surgeon or the [place] [phone] [place] [phone]).

## 2022-12-20 NOTE — Progress Notes (Signed)
AssistedDr. Oddono with left, adductor canal, ultrasound guided block. Side rails up, monitors on throughout procedure. See vital signs in flow sheet. Tolerated Procedure well. ? ?

## 2022-12-20 NOTE — Brief Op Note (Signed)
   Brief Op Note  Date of Surgery: 12/20/2022  Preoperative Diagnosis: LEFT PATELLOCHONDRAL LESION  Postoperative Diagnosis: same  Procedure: Procedure(s): LEFT KNEE ARTHROSCOPY WITH IMPLANTATION OF MATRIX-INDUCED AUTOLOGOUS CHONDROCYTE (MACI) / TIBIAL TUBRICLE OSTEOTOMY/ AND MEDIAL PATELLOFEMORAL LIGAMENT RECONSTRUCTION KNEE ARTHROSCOPY WITH MEDIAL PATELLAR FEMORAL LIGAMENT RECONSTRUCTION  Implants: Implant Name Type Inv. Item Serial No. Manufacturer Lot No. LRB No. Used Action  MACI AUTOLOGOUS CELL SCAFFOLD - [id number] Tissue MACI AUTOLOGOUS CELL SCAFFOLD [id number] VERICEL [id number] Left 1 Implanted  SCREW CORT 3.5X40 LP ANKLE - [id number] Screw SCREW CORT 3.5X40 LP ANKLE  [employer]  Left 2 Implanted  SCREW BONE 3.5X42MM CORTICAL - [id number] Screw SCREW BONE 3.5X42MM CORTICAL  [employer]  Left 1 Implanted    Surgeons: Surgeon(s): [name], MD  Anesthesia: General    Estimated Blood Loss: See anesthesia record  Complications: None  Condition to PACU: Stable  Benancio Deeds, MD 12/20/2022 11:49 AM

## 2022-12-21 ENCOUNTER — Encounter (HOSPITAL_BASED_OUTPATIENT_CLINIC_OR_DEPARTMENT_OTHER): Payer: Self-pay | Admitting: Orthopaedic Surgery

## 2022-12-22 NOTE — Therapy (Signed)
OUTPATIENT PHYSICAL THERAPY LOWER EXTREMITY EVALUATION   Patient Name: [name] MRN: [id number] DOB:1975-08-13, 47 y.o., female Today's Date: 12/24/2022  END OF SESSION:  PT End of Session - 12/23/22 1539     Visit Number 1    Number of Visits 16    Date for PT Re-Evaluation 02/17/23    PT Start Time 1345    PT Stop Time 1428    PT Time Calculation (min) 43 min    Activity Tolerance Patient tolerated treatment well    Behavior During Therapy [id number] for tasks assessed/performed             Past Medical History:  Diagnosis Date   Adenomatous colon polyp 12/2012   Allergic rhinitis    Arthritis    Cellulitis    Common migraine    Diastolic dysfunction    Dysrhythmia    External hemorrhoid    Genital herpes    GERD (gastroesophageal reflux disease)    Hx of migraines    Hyperlipidemia    Hypertension    Lynch syndrome    Mitral regurgitation    MVP (mitral valve prolapse)    Plica syndrome of left knee    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   32 mm Sorin Memo 4D ring annuloplasty via right mini thoracotomy approach   Past Surgical History:  Procedure Laterality Date   ABDOMINAL HYSTERECTOMY     CHOLECYSTECTOMY N/A 01/16/2020   Procedure: LAPAROSCOPIC CHOLECYSTECTOMY WITH INTRAOPERATIVE CHOLANGIOGRAM;  Surgeon: Darnell Level, MD;  Location: [facility];  Service: General;  Laterality: N/A;   COLONOSCOPY     KNEE ARTHROSCOPY WITH MACI CARTILAGE HARVEST Left 10/04/2022   Procedure: LEFT KNEE ARTHROSCOPY WITH MACI CARTILAGE HARVEST;  Surgeon: [name], MD;  Location: [facility];  Service: Orthopedics;  Laterality: Left;   KNEE ARTHROSCOPY WITH MEDIAL MENISECTOMY Left 06/07/2013   Procedure: LEFT KNEE ARTHROSCOPY WITH SYNOVECTOMY LIMITED, ARTHROSCOPY KNEE WITH DEBRIDEMENT/SHAVING (CHONDROPLASTY), ARTHROSCOPY KNEE  ChondroplastyWITH MEDIAL AND LATERAL  MENISECTOMY;  Surgeon: Sheral Apley, MD;  Location: [facility];  Service: Orthopedics;  Laterality: Left;   KNEE ARTHROSCOPY WITH MEDIAL PATELLAR FEMORAL LIGAMENT RECONSTRUCTION Left 12/20/2022   Procedure: KNEE ARTHROSCOPY WITH MEDIAL PATELLAR FEMORAL LIGAMENT RECONSTRUCTION;  Surgeon: [name], MD;  Location: [facility];  Service: Orthopedics;  Laterality: Left;   left knee arthroscopy     MITRAL VALVE REPAIR Right 11/28/2018   Procedure: MINIMALLY INVASIVE MITRAL VALVE REPAIR (MVR) - Using Memo 4D Ring Size 32;  Surgeon: Purcell Nails, MD;  Location: [facility] OR;  Service: Open Heart Surgery;  Laterality: Right;   patrial hysterectomy     POLYPECTOMY     SALPINGOOPHORECTOMY  2022   SYNOVECTOMY Left 06/07/2013   Procedure: SYNOVECTOMY;  Surgeon: Sheral Apley, MD;  Location:  SURGERY CENTER;  Service: Orthopedics;  Laterality: Left;   TEE WITHOUT CARDIOVERSION  10/11/2011   Procedure: TRANSESOPHAGEAL ECHOCARDIOGRAM (TEE);  Surgeon: Wendall Stade, MD;  Location: [facility];  Service: Cardiovascular;  Laterality: N/A;   TEE WITHOUT CARDIOVERSION N/A 11/28/2018   Procedure: TRANSESOPHAGEAL ECHOCARDIOGRAM (TEE);  Surgeon: Purcell Nails, MD;  Location: [facility] OR;  Service: Open Heart Surgery;  Laterality: N/A;   TUBAL LIGATION     WISDOM TOOTH EXTRACTION     Patient Active Problem List   Diagnosis Date Noted   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   Blurred vision, bilateral 10/27/2022   Patellar instability of left knee 10/04/2022  Primary osteoarthritis of left knee 09/07/2022   Primary hypertriglyceridemia 03/06/2020   Hyperglycemia 03/05/2020   Narrowing of intervertebral disc space 06/18/2019   Cardiomyopathy (HCC) 01/17/2019   Chronic diastolic CHF (congestive heart failure) (HCC) 12/05/2018   Essential hypertension 07/10/2018   Other specified hypothyroidism 09/22/2014   Visit for screening mammogram 09/22/2014   Insomnia, persistent 03/19/2014   Lynch syndrome 11/28/2012   MR (mitral regurgitation)  10/28/2011   MVP (mitral valve prolapse) 10/28/2011   ALLERGIC RHINITIS 11/06/2007   GERD 11/06/2007    PCP: Dr [name]   REFERRING PROVIDER: Dr [name]   REFERRING DIAG:  Diagnosis  M25.362 (ICD-10-CM) - Patellar instability of left knee   1.  Left knee tibial tubercle osteotomy 2.  Left knee matrix associated autologous chondrocyte implantation medial patella facet    THERAPY DIAG:  Acute pain of left knee  Stiffness of left knee, not elsewhere classified  Other abnormalities of gait and mobility  Localized edema  Rationale for Evaluation and Treatment: Rehabilitation  ONSET DATE: 6/18 surgical procedure. Patient having pain since 2008   SUBJECTIVE:   SUBJECTIVE STATEMENT: The patient has a long history of knee pain. She had a recent exacerbation. She had injections which had no long lasting relief. On 6/18 she had a Maci Procedure and  tibial osteotomy. She is currently using a walker for primary mobility. She has had some pain but feels like it is improving.   PERTINENT HISTORY: ACL repair, migraines, athritis of the knee, mitral valve prolapse ,  PAIN:  Are you having pain? Yes: NPRS scale: 8/10 Pain location: medial left knee  Pain description: sharp at times  Aggravating factors: being up  Relieving factors: rest/ pain medication   PRECAUTIONS: Kneefollow MACI protocol   WEIGHT BEARING RESTRICTIONS: Yes TDWB   FALLS:  Has patient fallen in last 6 months? No  LIVING ENVIRONMENT: 1 step into the house. OCCUPATION:  Child psychotherapist   Hobbies:    PLOF: Independent  PATIENT GOALS:   NEXT MD VISIT: 2 weeks    OBJECTIVE:   DIAGNOSTIC FINDINGS:   PATIENT SURVEYS:  FOTO    COGNITION: Overall cognitive status: Within functional limits for tasks assessed     SENSATION: WFL  EDEMA:  Moderate edema. Will take mid patella measurement next visit  MUSCLE LENGTH:  POSTURE: No Significant postural limitations  PALPATION:   LOWER  EXTREMITY ROM:  Passive ROM Right eval Left eval  Hip flexion    Hip extension    Hip abduction    Hip adduction    Hip internal rotation    Hip external rotation    Knee flexion  36  Knee extension  -3  Ankle dorsiflexion    Ankle plantarflexion    Ankle inversion    Ankle eversion     (Blank rows = not tested)  LOWER EXTREMITY MMT:  MMT Right eval Left eval  Hip flexion    Hip extension    Hip abduction    Hip adduction    Hip internal rotation    Hip external rotation    Knee flexion    Knee extension    Ankle dorsiflexion    Ankle plantarflexion    Ankle inversion    Ankle eversion     (Blank rows = not tested) Not tested 2nd to recent surgery     GAIT: Appears to be following weight bearing restrictions   TODAY'S TREATMENT:  DATE:   0-6 weeks:  beginning at 0-  40 ; advance 5-  10 deg daily as  tolerated      Exercises - Supine Quad Set  - 5 x daily - 7 x weekly - 3 sets - 10 reps - Seated Ankle Pumps  - 5 x daily - 7 x weekly - 3 sets - 10 reps - Supine Gluteal Sets  - 5 x daily - 7 x weekly - 3 sets - 10 reps  Manual: PROM per protocol limits  Changed dressing   PATIENT EDUCATION:  Education details: HEP, symptom management; importance of weight bearing  Person educated: Patient Education method: Explanation, Demonstration, Tactile cues, Verbal cues, and Handouts Education comprehension: verbalized understanding, returned demonstration, verbal cues required, tactile cues required, and needs further education  HOME EXERCISE PROGRAM: Access Code: [email or web address] URL: [email or web address] Date: 12/23/2022 Prepared by: Lorayne Bender   ASSESSMENT:  CLINICAL IMPRESSION: Patient is a 47 year old female S/P MACI procedure and tibial osteotomy on 6/18/204. She presents with expected limitations in rom,  strength, and function. She has been complaint with her weigh bearing status. She has fluctuating high levels of pain at this time but it is improving. Her ROM is doing well. She would benefit from skilled therapy to improve functional mobility.  OBJECTIVE IMPAIRMENTS: Abnormal gait, decreased activity tolerance, decreased balance, decreased knowledge of condition, decreased knowledge of use of DME, difficulty walking, decreased ROM, decreased strength, and pain.   ACTIVITY LIMITATIONS: carrying, lifting, bending, sitting, standing, squatting, sleeping, stairs, transfers, bed mobility, bathing, toileting, dressing, reach over head, hygiene/grooming, and locomotion level  PARTICIPATION LIMITATIONS: meal prep, cleaning, laundry, driving, shopping, community activity, occupation, and yard work  PERSONAL FACTORS: 1-2 comorbidities: Mitral valve repair, ACL repair on the right side  are also affecting patient's functional outcome.   REHAB POTENTIAL: Excellent  CLINICAL DECISION MAKING: Stable/uncomplicated  EVALUATION COMPLEXITY: Low   GOALS: Goals reviewed with patient? Yes  SHORT TERM GOALS: Target date: 01/19/2023   Patient will increase passive left knee flexion by 10 degrees a week until full flexion is met Baseline: Goal status: INITIAL  2.  Patient will demonstrate full active left knee extension Baseline:  Goal status: INITIAL  3.  Patient will progress off walker when cleared by MD for weightbearing Baseline:  Goal status: INITIAL  4.  Patient will be independent with basic HEP Baseline:  Goal status: INITIAL  LONG TERM GOALS: Target date: 02/16/2023    Patient will go up and down 8 steps with reciprocal gait pattern Baseline:  Goal status: INITIAL  2.  Patient will ambulate community distances without increased left knee pain Baseline:  Goal status: INITIAL  3.  Patient will have a complete HEP to promote further left knee strengthening Baseline:  Goal status:  INITIAL   PLAN:  PT FREQUENCY: 2x/week  PT DURATION: 12 weeks  PLANNED INTERVENTIONS: Therapeutic exercises, Therapeutic activity, Neuromuscular re-education, Balance training, Gait training, Patient/Family education, Self Care, Joint mobilization, Stair training, DME instructions, Aquatic Therapy, Dry Needling, Cryotherapy, Moist heat, Taping, Ultrasound, and Manual therapy  PLAN FOR NEXT SESSION: follow  cartilagep-p/ restoration protocol    Dessie Coma, PT 12/24/2022, 10:24 AM

## 2022-12-23 ENCOUNTER — Encounter (HOSPITAL_BASED_OUTPATIENT_CLINIC_OR_DEPARTMENT_OTHER): Payer: Self-pay | Admitting: Physical Therapy

## 2022-12-23 ENCOUNTER — Ambulatory Visit (HOSPITAL_BASED_OUTPATIENT_CLINIC_OR_DEPARTMENT_OTHER): Payer: 59 | Attending: Orthopaedic Surgery | Admitting: Physical Therapy

## 2022-12-23 DIAGNOSIS — R2689 Other abnormalities of gait and mobility: Secondary | ICD-10-CM | POA: Insufficient documentation

## 2022-12-23 DIAGNOSIS — M25662 Stiffness of left knee, not elsewhere classified: Secondary | ICD-10-CM | POA: Diagnosis present

## 2022-12-23 DIAGNOSIS — R600 Localized edema: Secondary | ICD-10-CM | POA: Insufficient documentation

## 2022-12-23 DIAGNOSIS — M25562 Pain in left knee: Secondary | ICD-10-CM | POA: Insufficient documentation

## 2022-12-23 DIAGNOSIS — M25362 Other instability, left knee: Secondary | ICD-10-CM | POA: Diagnosis not present

## 2022-12-24 ENCOUNTER — Other Ambulatory Visit: Payer: Self-pay

## 2022-12-24 ENCOUNTER — Ambulatory Visit (HOSPITAL_BASED_OUTPATIENT_CLINIC_OR_DEPARTMENT_OTHER): Payer: 59 | Admitting: Physical Therapy

## 2022-12-24 ENCOUNTER — Encounter (HOSPITAL_BASED_OUTPATIENT_CLINIC_OR_DEPARTMENT_OTHER): Payer: Self-pay | Admitting: Physical Therapy

## 2022-12-29 ENCOUNTER — Ambulatory Visit (HOSPITAL_BASED_OUTPATIENT_CLINIC_OR_DEPARTMENT_OTHER): Payer: 59

## 2022-12-29 ENCOUNTER — Encounter (HOSPITAL_BASED_OUTPATIENT_CLINIC_OR_DEPARTMENT_OTHER): Payer: Self-pay

## 2022-12-29 DIAGNOSIS — M25562 Pain in left knee: Secondary | ICD-10-CM

## 2022-12-29 DIAGNOSIS — R600 Localized edema: Secondary | ICD-10-CM

## 2022-12-29 DIAGNOSIS — M25662 Stiffness of left knee, not elsewhere classified: Secondary | ICD-10-CM

## 2022-12-29 DIAGNOSIS — R2689 Other abnormalities of gait and mobility: Secondary | ICD-10-CM

## 2022-12-29 NOTE — Therapy (Signed)
OUTPATIENT PHYSICAL THERAPY LOWER EXTREMITY EVALUATION   Patient Name: [name] MRN: [id number] DOB:09/26/1975, 47 y.o., female Today's Date: 12/29/2022  END OF SESSION:  PT End of Session - 12/29/22 1346     Visit Number 2    Number of Visits 16    Date for PT Re-Evaluation 02/17/23    PT Start Time 1347    PT Stop Time 1430    PT Time Calculation (min) 43 min    Activity Tolerance Patient tolerated treatment well    Behavior During Therapy [facility] for tasks assessed/performed              Past Medical History:  Diagnosis Date   Adenomatous colon polyp 12/2012   Allergic rhinitis    Arthritis    Cellulitis    Common migraine    Diastolic dysfunction    Dysrhythmia    External hemorrhoid    Genital herpes    GERD (gastroesophageal reflux disease)    Hx of migraines    Hyperlipidemia    Hypertension    Lynch syndrome    Mitral regurgitation    MVP (mitral valve prolapse)    Plica syndrome of left knee    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   32 mm Sorin Memo 4D ring annuloplasty via right mini thoracotomy approach   Past Surgical History:  Procedure Laterality Date   ABDOMINAL HYSTERECTOMY     CHOLECYSTECTOMY N/A 01/16/2020   Procedure: LAPAROSCOPIC CHOLECYSTECTOMY WITH INTRAOPERATIVE CHOLANGIOGRAM;  Surgeon: Darnell Level, MD;  Location: [facility];  Service: General;  Laterality: N/A;   COLONOSCOPY     KNEE ARTHROSCOPY WITH MACI CARTILAGE HARVEST Left 10/04/2022   Procedure: LEFT KNEE ARTHROSCOPY WITH MACI CARTILAGE HARVEST;  Surgeon: [name], MD;  Location: [facility];  Service: Orthopedics;  Laterality: Left;   KNEE ARTHROSCOPY WITH MEDIAL MENISECTOMY Left 06/07/2013   Procedure: LEFT KNEE ARTHROSCOPY WITH SYNOVECTOMY LIMITED, ARTHROSCOPY KNEE WITH DEBRIDEMENT/SHAVING (CHONDROPLASTY), ARTHROSCOPY KNEE  ChondroplastyWITH MEDIAL AND LATERAL  MENISECTOMY;  Surgeon: Sheral Apley, MD;  Location: [facility];  Service: Orthopedics;  Laterality: Left;   KNEE ARTHROSCOPY WITH MEDIAL PATELLAR FEMORAL LIGAMENT RECONSTRUCTION Left 12/20/2022   Procedure: KNEE ARTHROSCOPY WITH MEDIAL PATELLAR FEMORAL LIGAMENT RECONSTRUCTION;  Surgeon: [name], MD;  Location: [facility];  Service: Orthopedics;  Laterality: Left;   left knee arthroscopy     MITRAL VALVE REPAIR Right 11/28/2018   Procedure: MINIMALLY INVASIVE MITRAL VALVE REPAIR (MVR) - Using Memo 4D Ring Size 32;  Surgeon: Purcell Nails, MD;  Location: [facility] OR;  Service: Open Heart Surgery;  Laterality: Right;   patrial hysterectomy     POLYPECTOMY     SALPINGOOPHORECTOMY  2022   SYNOVECTOMY Left 06/07/2013   Procedure: SYNOVECTOMY;  Surgeon: Sheral Apley, MD;  Location: [facility];  Service: Orthopedics;  Laterality: Left;   TEE WITHOUT CARDIOVERSION  10/11/2011   Procedure: TRANSESOPHAGEAL ECHOCARDIOGRAM (TEE);  Surgeon: Wendall Stade, MD;  Location: [facility];  Service: Cardiovascular;  Laterality: N/A;   TEE WITHOUT CARDIOVERSION N/A 11/28/2018   Procedure: TRANSESOPHAGEAL ECHOCARDIOGRAM (TEE);  Surgeon: Purcell Nails, MD;  Location: [facility] OR;  Service: Open Heart Surgery;  Laterality: N/A;   TUBAL LIGATION     WISDOM TOOTH EXTRACTION     Patient Active Problem List   Diagnosis Date Noted   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   Blurred vision, bilateral 10/27/2022   Patellar instability of left knee 10/04/2022  Primary osteoarthritis of left knee 09/07/2022   Primary hypertriglyceridemia 03/06/2020   Hyperglycemia 03/05/2020   Narrowing of intervertebral disc space 06/18/2019   Cardiomyopathy (HCC) 01/17/2019   Chronic diastolic CHF (congestive heart failure) (HCC) 12/05/2018   Essential hypertension 07/10/2018   Other specified hypothyroidism 09/22/2014   Visit for screening mammogram 09/22/2014   Insomnia, persistent 03/19/2014   Lynch syndrome 11/28/2012   MR (mitral regurgitation)  10/28/2011   MVP (mitral valve prolapse) 10/28/2011   ALLERGIC RHINITIS 11/06/2007   GERD 11/06/2007    PCP: Dr [name]   REFERRING PROVIDER: Dr [name]   REFERRING DIAG:  Diagnosis  M25.362 (ICD-10-CM) - Patellar instability of left knee   1.  Left knee tibial tubercle osteotomy 2.  Left knee matrix associated autologous chondrocyte implantation medial patella facet    THERAPY DIAG:  Acute pain of left knee  Stiffness of left knee, not elsewhere classified  Other abnormalities of gait and mobility  Localized edema  Rationale for Evaluation and Treatment: Rehabilitation  ONSET DATE: 6/18 surgical procedure. Patient having pain since 2008   SUBJECTIVE:   SUBJECTIVE STATEMENT: Pt has 3/10 pain level at entry. States she did not take medication today. She arrives using FWW and has brace donned. Using TTWB, though she admits to being on feet more than she should.   PERTINENT HISTORY: ACL repair, migraines, athritis of the knee, mitral valve prolapse ,  PAIN:  Are you having pain? Yes: NPRS scale: 3/10 Pain location: medial left knee  Pain description: sharp at times  Aggravating factors: being up  Relieving factors: rest/ pain medication   PRECAUTIONS: Kneefollow MACI protocol   WEIGHT BEARING RESTRICTIONS: Yes TDWB   FALLS:  Has patient fallen in last 6 months? No  LIVING ENVIRONMENT: 1 step into the house. OCCUPATION:  Child psychotherapist   Hobbies:    PLOF: Independent  PATIENT GOALS:   NEXT MD VISIT: 2 weeks    OBJECTIVE:   DIAGNOSTIC FINDINGS:   PATIENT SURVEYS:  FOTO    COGNITION: Overall cognitive status: Within functional limits for tasks assessed     SENSATION: WFL  EDEMA:  Moderate edema. Will take mid patella measurement next visit  MUSCLE LENGTH:  POSTURE: No Significant postural limitations  PALPATION:   LOWER EXTREMITY ROM:  Passive ROM Right eval Left eval Left 6/27  Hip flexion     Hip extension     Hip  abduction     Hip adduction     Hip internal rotation     Hip external rotation     Knee flexion  36 61  Knee extension  -3 0  Ankle dorsiflexion     Ankle plantarflexion     Ankle inversion     Ankle eversion      (Blank rows = not tested)  LOWER EXTREMITY MMT:  MMT Right eval Left eval  Hip flexion    Hip extension    Hip abduction    Hip adduction    Hip internal rotation    Hip external rotation    Knee flexion    Knee extension    Ankle dorsiflexion    Ankle plantarflexion    Ankle inversion    Ankle eversion     (Blank rows = not tested) Not tested 2nd to recent surgery     GAIT: Appears to be following weight bearing restrictions   TODAY'S TREATMENT:  DATE:   0-6 weeks:  beginning at 0-  40 ; advance 5-  10 deg daily as  tolerated   6/27:  Dressing change PROM within pain limits (61deg flexion today) Quad sets 2x10 5s hold Supine SLR with manual assist 1x10 Sidelying hip abduction 2x10 Glute sets 5s x10 Review of HEP Review of precautions/WB status/proper brace adjustment   PATIENT EDUCATION:  Education details: HEP, symptom management; importance of weight bearing  Person educated: Patient Education method: Explanation, Demonstration, Tactile cues, Verbal cues, and Handouts Education comprehension: verbalized understanding, returned demonstration, verbal cues required, tactile cues required, and needs further education  HOME EXERCISE PROGRAM: Access Code: [email or web address] URL: [email or web address] Date: 12/23/2022 Prepared by: Lorayne Bender   ASSESSMENT:  CLINICAL IMPRESSION: Removed and replaced dressing. Light drainage present on gauze, though no concern for infection observed. Educated pt on WB status and healing timeline/protocol. Pt denied using ice, so educated her on importance of this for this  stage.Improvements in knee ROM since IE. Pt able to obtain full TKE easily and achieved 61deg flexion passively. Some discomfort at end range flexion, but careful to avoid exceeding pain limits. Will continue with protocol. Pt has f/u 7/3.  OBJECTIVE IMPAIRMENTS: Abnormal gait, decreased activity tolerance, decreased balance, decreased knowledge of condition, decreased knowledge of use of DME, difficulty walking, decreased ROM, decreased strength, and pain.   ACTIVITY LIMITATIONS: carrying, lifting, bending, sitting, standing, squatting, sleeping, stairs, transfers, bed mobility, bathing, toileting, dressing, reach over head, hygiene/grooming, and locomotion level  PARTICIPATION LIMITATIONS: meal prep, cleaning, laundry, driving, shopping, community activity, occupation, and yard work  PERSONAL FACTORS: 1-2 comorbidities: Mitral valve repair, ACL repair on the right side  are also affecting patient's functional outcome.   REHAB POTENTIAL: Excellent  CLINICAL DECISION MAKING: Stable/uncomplicated  EVALUATION COMPLEXITY: Low   GOALS: Goals reviewed with patient? Yes  SHORT TERM GOALS: Target date: 01/19/2023   Patient will increase passive left knee flexion by 10 degrees a week until full flexion is met Baseline: Goal status: INITIAL  2.  Patient will demonstrate full active left knee extension Baseline:  Goal status: INITIAL  3.  Patient will progress off walker when cleared by MD for weightbearing Baseline:  Goal status: INITIAL  4.  Patient will be independent with basic HEP Baseline:  Goal status: INITIAL  LONG TERM GOALS: Target date: 02/16/2023    Patient will go up and down 8 steps with reciprocal gait pattern Baseline:  Goal status: INITIAL  2.  Patient will ambulate community distances without increased left knee pain Baseline:  Goal status: INITIAL  3.  Patient will have a complete HEP to promote further left knee strengthening Baseline:  Goal status:  INITIAL   PLAN:  PT FREQUENCY: 2x/week  PT DURATION: 12 weeks  PLANNED INTERVENTIONS: Therapeutic exercises, Therapeutic activity, Neuromuscular re-education, Balance training, Gait training, Patient/Family education, Self Care, Joint mobilization, Stair training, DME instructions, Aquatic Therapy, Dry Needling, Cryotherapy, Moist heat, Taping, Ultrasound, and Manual therapy  PLAN FOR NEXT SESSION: follow  cartilagep-p/ restoration protocol    Donnel Saxon Gerren Hoffmeier, PTA 12/29/2022, 3:38 PM

## 2023-01-02 ENCOUNTER — Encounter (HOSPITAL_BASED_OUTPATIENT_CLINIC_OR_DEPARTMENT_OTHER): Payer: Self-pay | Admitting: Orthopaedic Surgery

## 2023-01-02 ENCOUNTER — Other Ambulatory Visit (HOSPITAL_BASED_OUTPATIENT_CLINIC_OR_DEPARTMENT_OTHER): Payer: Self-pay | Admitting: Orthopaedic Surgery

## 2023-01-02 MED ORDER — OXYCODONE HCL 5 MG PO TABS: 5.0000 mg | ORAL_TABLET | ORAL | 0 refills | Status: DC | PRN

## 2023-01-04 ENCOUNTER — Encounter: Payer: Self-pay | Admitting: Physical Therapy

## 2023-01-04 ENCOUNTER — Ambulatory Visit (INDEPENDENT_AMBULATORY_CARE_PROVIDER_SITE_OTHER): Payer: 59 | Admitting: Physical Therapy

## 2023-01-04 ENCOUNTER — Ambulatory Visit (INDEPENDENT_AMBULATORY_CARE_PROVIDER_SITE_OTHER): Payer: 59 | Admitting: Orthopaedic Surgery

## 2023-01-04 DIAGNOSIS — M25562 Pain in left knee: Secondary | ICD-10-CM

## 2023-01-04 DIAGNOSIS — M25362 Other instability, left knee: Secondary | ICD-10-CM

## 2023-01-04 NOTE — Progress Notes (Signed)
Post Operative Evaluation    Procedure/Date of Surgery: Left knee MACI and tibial tubercle osteotomy 6/18  Interval History:    Presents today 2 weeks status post the above procedure.  Overall she is doing very well.  She has put some additional weight on it.  She has been compliant with brace usage.  Denies any fevers or chills   PMH/PSH/Family History/Social History/Meds/Allergies:    Past Medical History:  Diagnosis Date   Adenomatous colon polyp 12/2012   Allergic rhinitis    Arthritis    Cellulitis    Common migraine    Diastolic dysfunction    Dysrhythmia    External hemorrhoid    Genital herpes    GERD (gastroesophageal reflux disease)    Hx of migraines    Hyperlipidemia    Hypertension    Lynch syndrome    Mitral regurgitation    MVP (mitral valve prolapse)    Plica syndrome of left knee    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   32 mm Sorin Memo 4D ring annuloplasty via right mini thoracotomy approach   Past Surgical History:  Procedure Laterality Date   ABDOMINAL HYSTERECTOMY     CHOLECYSTECTOMY N/A 01/16/2020   Procedure: LAPAROSCOPIC CHOLECYSTECTOMY WITH INTRAOPERATIVE CHOLANGIOGRAM;  Surgeon: Darnell Level, MD;  Location: [facility];  Service: General;  Laterality: N/A;   COLONOSCOPY     KNEE ARTHROSCOPY WITH MACI CARTILAGE HARVEST Left 10/04/2022   Procedure: LEFT KNEE ARTHROSCOPY WITH MACI CARTILAGE HARVEST;  Surgeon: [name], MD;  Location: [facility];  Service: Orthopedics;  Laterality: Left;   KNEE ARTHROSCOPY WITH MEDIAL MENISECTOMY Left 06/07/2013   Procedure: LEFT KNEE ARTHROSCOPY WITH SYNOVECTOMY LIMITED, ARTHROSCOPY KNEE WITH DEBRIDEMENT/SHAVING (CHONDROPLASTY), ARTHROSCOPY KNEE  ChondroplastyWITH MEDIAL AND LATERAL  MENISECTOMY;  Surgeon: Sheral Apley, MD;  Location: [facility];  Service: Orthopedics;  Laterality: Left;   KNEE ARTHROSCOPY WITH MEDIAL PATELLAR  FEMORAL LIGAMENT RECONSTRUCTION Left 12/20/2022   Procedure: KNEE ARTHROSCOPY WITH MEDIAL PATELLAR FEMORAL LIGAMENT RECONSTRUCTION;  Surgeon: [name], MD;  Location: [facility];  Service: Orthopedics;  Laterality: Left;   left knee arthroscopy     MITRAL VALVE REPAIR Right 11/28/2018   Procedure: MINIMALLY INVASIVE MITRAL VALVE REPAIR (MVR) - Using Memo 4D Ring Size 32;  Surgeon: Purcell Nails, MD;  Location: [facility] OR;  Service: Open Heart Surgery;  Laterality: Right;   patrial hysterectomy     POLYPECTOMY     SALPINGOOPHORECTOMY  2022   SYNOVECTOMY Left 06/07/2013   Procedure: SYNOVECTOMY;  Surgeon: Sheral Apley, MD;  Location:  SUR[facility]ERY [facility];  Service: Orthopedics;  Laterality: Left;   TEE WITHOUT CARDIOVERSION  10/11/2011   Procedure: TRANSESOPHAGEAL ECHOCARDIOGRAM (TEE);  Surgeon: Wendall Stade, MD;  Location: [facility] ENDOSCOPY;  Service: Cardiovascular;  Laterality: N/A;   TEE WITHOUT CARDIOVERSION N/A 11/28/2018   Procedure: TRANSESOPHAGEAL ECHOCARDIOGRAM (TEE);  Surgeon: Purcell Nails, MD;  Location: [facility] OR;  Service: Open Heart Surgery;  Laterality: N/A;   TUBAL LIGATION     WISDOM TOOTH EXTRACTION     Social History   Socioeconomic History   Marital status: Married    Spouse name: Barbara Cower   Number of children: 2   Years of education: Not on file   Highest education level: Not on file  Occupational History   Occupation: patient account rep    Employer: [employer]  Tobacco Use   Smoking status: Former    Packs/day: 0.50    Years: 12.00    Additional pack years: 0.00    Total pack years: 6.00    Types: Cigars, Cigarettes    Quit date: 11/29/2010    Years since quitting: 12.1   Smokeless tobacco: Never  Vaping Use   Vaping Use: Never used  Substance and Sexual Activity   Alcohol use: Yes    Alcohol/week: 2.0 standard drinks of alcohol    Types: 2 Glasses of wine per week    Comment: social   Drug use: No   Sexual activity: Yes     Birth control/protection: Surgical  Other Topics Concern   Not on file  Social History Narrative   Not on file   Social Determinants of Health   Financial Resource Strain: Not on file  Food Insecurity: Not on file  Transportation Needs: Not on file  Physical Activity: Not on file  Stress: Not on file  Social Connections: Not on file   Family History  Problem Relation Age of Onset   Other Mother        lynch syndrome   Colon cancer Father 83   Colon polyps Maternal Uncle    Prostate cancer Maternal Uncle 64   Colon cancer Paternal Aunt 30   Colon cancer Paternal Uncle 6   Colon cancer Paternal Uncle 69   Multiple myeloma Maternal Grandmother 52   Parkinsonism Paternal Grandfather    Other Daughter        Lynch syndrome   Breast cancer Cousin        lynch syndrome    Breast cancer Other        Paternal Great Grandmother   Esophageal cancer Neg Hx    Rectal cancer Neg Hx    Allergies  Allergen Reactions   Bee Venom    Current Outpatient Medications  Medication Sig Dispense Refill   aspirin EC 325 MG tablet Take 1 tablet (325 mg total) by mouth daily. 30 tablet 0   estradiol (VIVELLE-DOT) 0.1 MG/24HR patch Place 1 patch onto the skin 2 (two) times a week.     oxyCODONE (ROXICODONE) 5 MG immediate release tablet Take 1 tablet (5 mg total) by mouth every 4 (four) hours as needed for severe pain or breakthrough pain. 30 tablet 0   oxyCODONE (ROXICODONE) 5 MG immediate release tablet Take 1 tablet (5 mg total) by mouth every 4 (four) hours as needed for severe pain or breakthrough pain. 20 tablet 0   Potassium Chloride ER 20 MEQ TBCR Take 20 mEq by mouth 2 (two) times daily. 180 tablet 0   torsemide (DEMADEX) 10 MG tablet Take 3 tablets (30 mg total) by mouth daily. 270 tablet 3   traMADol (ULTRAM) 50 MG tablet Take 1 tablet (50 mg total) by mouth every 12 (twelve) hours as needed. 30 tablet 0   Ubrogepant (UBRELVY) 100 MG TABS Take 1 tablet (100 mg total) by mouth daily  as needed. 30 tablet 1   valACYclovir (VALTREX) 500 MG tablet Take 500 mg by mouth as needed.     No current facility-administered medications for this visit.   No results found.  Review of Systems:   A ROS was performed including pertinent positives and negatives as documented in the HPI.   Musculoskeletal Exam:    Left leg is well-appearing.  Incisions are well-healed.  Able to fire tibialis anterior as well as gastrocsoleus.  2+ dorsalis pedis pulse.  Swelling is minimal.  Range of motion about the left knee is deferred.  There is significant quad atrophy  Imaging:     I personally reviewed and interpreted the radiographs.   Assessment:   2 weeks status post left tibial tubercle osteotomy as well as patella MACI overall doing extremely well.  At this time she will continue to advance according to the tibial tubercle protocol.  She will begin weightbearing on the left leg.  I will plan to see her back in 4 weeks for reassessment  Plan :    -Return to clinic 4 weeks for reassessment      I personally saw and evaluated the patient, and participated in the management and treatment plan.  [name], MD Attending Physician, Orthopedic Surgery  This document was dictated using Dragon voice recognition software. A reasonable attempt at proof reading has been made to minimize errors.

## 2023-01-04 NOTE — Therapy (Signed)
OUTPATIENT PHYSICAL THERAPY SCREEN @[address]   Patient Name: [name] MRN: [id number] DOB:Oct 13, 1975, 47 y.o., female Today's Date: 01/04/2023  END OF SESSION:  PT End of Session - 01/04/23 1006     Visit Number 1             Past Medical History:  Diagnosis Date   Adenomatous colon polyp 12/2012   Allergic rhinitis    Arthritis    Cellulitis    Common migraine    Diastolic dysfunction    Dysrhythmia    External hemorrhoid    Genital herpes    GERD (gastroesophageal reflux disease)    Hx of migraines    Hyperlipidemia    Hypertension    Lynch syndrome    Mitral regurgitation    MVP (mitral valve prolapse)    Plica syndrome of left knee    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   32 mm Sorin Memo 4D ring annuloplasty via right mini thoracotomy approach   Past Surgical History:  Procedure Laterality Date   ABDOMINAL HYSTERECTOMY     CHOLECYSTECTOMY N/A 01/16/2020   Procedure: LAPAROSCOPIC CHOLECYSTECTOMY WITH INTRAOPERATIVE CHOLANGIOGRAM;  Surgeon: Darnell Level, MD;  Location: [facility];  Service: General;  Laterality: N/A;   COLONOSCOPY     KNEE ARTHROSCOPY WITH MACI CARTILAGE HARVEST Left 10/04/2022   Procedure: LEFT KNEE ARTHROSCOPY WITH MACI CARTILAGE HARVEST;  Surgeon: [name], MD;  Location: [facility];  Service: Orthopedics;  Laterality: Left;   KNEE ARTHROSCOPY WITH MEDIAL MENISECTOMY Left 06/07/2013   Procedure: LEFT KNEE ARTHROSCOPY WITH SYNOVECTOMY LIMITED, ARTHROSCOPY KNEE WITH DEBRIDEMENT/SHAVING (CHONDROPLASTY), ARTHROSCOPY KNEE  ChondroplastyWITH MEDIAL AND LATERAL  MENISECTOMY;  Surgeon: Sheral Apley, MD;  Location: [facility];  Service: Orthopedics;  Laterality: Left;   KNEE ARTHROSCOPY WITH MEDIAL PATELLAR FEMORAL LIGAMENT RECONSTRUCTION Left 12/20/2022   Procedure: KNEE ARTHROSCOPY WITH MEDIAL PATELLAR FEMORAL LIGAMENT RECONSTRUCTION;  Surgeon: [name], MD;  Location:  [facility];  Service: Orthopedics;  Laterality: Left;   left knee arthroscopy     MITRAL VALVE REPAIR Right 11/28/2018   Procedure: MINIMALLY INVASIVE MITRAL VALVE REPAIR (MVR) - Using Memo 4D Ring Size 32;  Surgeon: Purcell Nails, MD;  [facility] OR;  Service: Open Heart Surgery;  Laterality: Right;   patrial hysterectomy     POLYPECTOMY     SALPINGOOPHORECTOMY  2022   SYNOVECTOMY Left 06/07/2013   Procedure: SYNOVECTOMY;  Surgeon: Sheral Apley, MD;  Location: [facility];  Service: Orthopedics;  Laterality: Left;   TEE WITHOUT CARDIOVERSION  10/11/2011   Procedure: TRANSESOPHAGEAL ECHOCARDIOGRAM (TEE);  Surgeon: Wendall Stade, MD;  Location: 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   Blurred vision, bilateral 10/27/2022   Patellar instability of left knee 10/04/2022   Primary osteoarthritis of left knee 09/07/2022   Primary hypertriglyceridemia 03/06/2020   Hyperglycemia 03/05/2020   Narrowing of intervertebral disc space 06/18/2019   Cardiomyopathy (HCC) 01/17/2019   Chronic diastolic CHF (congestive heart failure) (HCC) 12/05/2018   Essential hypertension 07/10/2018   Other specified hypothyroidism 09/22/2014   Visit for screening mammogram 09/22/2014  Insomnia, persistent 03/19/2014   Lynch syndrome 11/28/2012   MR (mitral regurgitation) 10/28/2011   MVP (mitral valve prolapse) 10/28/2011   ALLERGIC RHINITIS 11/06/2007   GERD 11/06/2007     THERAPY DIAG:  Acute pain of left knee  Goal of screen:  This patient was referred to Physical Therapy specialty screen by [name], MD for gait  training with device (RW).   [facility] HEP code:  [email or web address]  Clinical Impression & Plan:  Good contraction of quad noted. Worked on ambulation pattern with TDWB and brace locked in ext. Will try sleeping in bed with brace locked.    Army Fossa PT, DPT 01/04/2023, 10:06 AM  [address] [place], [place] [address] [phone]   Note: charges not applied for screen.

## 2023-01-06 ENCOUNTER — Other Ambulatory Visit (HOSPITAL_BASED_OUTPATIENT_CLINIC_OR_DEPARTMENT_OTHER): Payer: Self-pay

## 2023-01-06 ENCOUNTER — Encounter (HOSPITAL_BASED_OUTPATIENT_CLINIC_OR_DEPARTMENT_OTHER): Payer: Self-pay | Admitting: Physical Therapy

## 2023-01-06 ENCOUNTER — Ambulatory Visit (HOSPITAL_BASED_OUTPATIENT_CLINIC_OR_DEPARTMENT_OTHER): Payer: 59 | Attending: Orthopaedic Surgery | Admitting: Physical Therapy

## 2023-01-06 DIAGNOSIS — M25562 Pain in left knee: Secondary | ICD-10-CM | POA: Insufficient documentation

## 2023-01-06 DIAGNOSIS — M25662 Stiffness of left knee, not elsewhere classified: Secondary | ICD-10-CM | POA: Insufficient documentation

## 2023-01-06 DIAGNOSIS — R2689 Other abnormalities of gait and mobility: Secondary | ICD-10-CM | POA: Insufficient documentation

## 2023-01-06 DIAGNOSIS — R600 Localized edema: Secondary | ICD-10-CM | POA: Insufficient documentation

## 2023-01-06 MED ORDER — MELOXICAM 15 MG PO TABS
15.0000 mg | ORAL_TABLET | Freq: Every day | ORAL | 0 refills | Status: DC
  Filled 2023-01-11: qty 7, 7d supply, fill #0

## 2023-01-06 MED ORDER — ESTRADIOL 0.1 MG/24HR TD PTTW
1.0000 | MEDICATED_PATCH | TRANSDERMAL | 3 refills | Status: DC
  Filled 2023-03-03: qty 8, 28d supply, fill #0
  Filled 2023-04-03: qty 8, 28d supply, fill #1
  Filled 2023-05-01 – 2023-07-25 (×2): qty 8, 28d supply, fill #2

## 2023-01-06 MED ORDER — ACETAMINOPHEN 500 MG PO TABS
500.0000 mg | ORAL_TABLET | Freq: Three times a day (TID) | ORAL | 0 refills | Status: DC
  Filled 2023-01-11 – 2023-01-12 (×2): qty 30, 10d supply, fill #0

## 2023-01-06 MED ORDER — ESTRADIOL 0.1 MG/24HR TD PTTW
1.0000 | MEDICATED_PATCH | TRANSDERMAL | 6 refills | Status: DC
  Filled 2023-01-06 – 2023-01-13 (×2): qty 8, 28d supply, fill #0
  Filled 2023-02-03: qty 8, 28d supply, fill #1

## 2023-01-06 NOTE — Therapy (Signed)
OUTPATIENT PHYSICAL THERAPY LOWER EXTREMITY EVALUATION   Patient Name: [name] MRN: [id number] DOB:1975-12-05, 47 y.o., female Today's Date: 01/06/2023  END OF SESSION:  PT End of Session - 01/06/23 1058     Visit Number 3    Number of Visits 16    Date for PT Re-Evaluation 02/17/23    PT Start Time 1055    PT Stop Time 1137    PT Time Calculation (min) 42 min    Activity Tolerance Patient tolerated treatment well    Behavior During Therapy [facility] for tasks assessed/performed               Past Medical History:  Diagnosis Date   Adenomatous colon polyp 12/2012   Allergic rhinitis    Arthritis    Cellulitis    Common migraine    Diastolic dysfunction    Dysrhythmia    External hemorrhoid    Genital herpes    GERD (gastroesophageal reflux disease)    Hx of migraines    Hyperlipidemia    Hypertension    Lynch syndrome    Mitral regurgitation    MVP (mitral valve prolapse)    Plica syndrome of left knee    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   32 mm Sorin Memo 4D ring annuloplasty via right mini thoracotomy approach   Past Surgical History:  Procedure Laterality Date   ABDOMINAL HYSTERECTOMY     CHOLECYSTECTOMY N/A 01/16/2020   Procedure: LAPAROSCOPIC CHOLECYSTECTOMY WITH INTRAOPERATIVE CHOLANGIOGRAM;  Surgeon: Darnell Level, MD;  Location: [facility];  Service: General;  Laterality: N/A;   COLONOSCOPY     KNEE ARTHROSCOPY WITH MACI CARTILAGE HARVEST Left 10/04/2022   Procedure: LEFT KNEE ARTHROSCOPY WITH MACI CARTILAGE HARVEST;  Surgeon: [name], MD;  Location: [facility];  Service: Orthopedics;  Laterality: Left;   KNEE ARTHROSCOPY WITH MEDIAL MENISECTOMY Left 06/07/2013   Procedure: LEFT KNEE ARTHROSCOPY WITH SYNOVECTOMY LIMITED, ARTHROSCOPY KNEE WITH DEBRIDEMENT/SHAVING (CHONDROPLASTY), ARTHROSCOPY KNEE  ChondroplastyWITH MEDIAL AND LATERAL  MENISECTOMY;  Surgeon: Sheral Apley, MD;  Location: [facility];  Service: Orthopedics;  Laterality: Left;   KNEE ARTHROSCOPY WITH MEDIAL PATELLAR FEMORAL LIGAMENT RECONSTRUCTION Left 12/20/2022   Procedure: KNEE ARTHROSCOPY WITH MEDIAL PATELLAR FEMORAL LIGAMENT RECONSTRUCTION;  Surgeon: [name], MD;  Location: [facility];  Service: Orthopedics;  Laterality: Left;   left knee arthroscopy     MITRAL VALVE REPAIR Right 11/28/2018   Procedure: MINIMALLY INVASIVE MITRAL VALVE REPAIR (MVR) - Using Memo 4D Ring Size 32;  Surgeon: Purcell Nails, MD;  Location: [facility];  Service: Open Heart Surgery;  Laterality: Right;   patrial hysterectomy     POLYPECTOMY     SALPINGOOPHORECTOMY  2022   SYNOVECTOMY Left 06/07/2013   Procedure: SYNOVECTOMY;  Surgeon: Sheral Apley, MD;  Location: [facility];  Service: Orthopedics;  Laterality: Left;   TEE WITHOUT CARDIOVERSION  10/11/2011   Procedure: TRANSESOPHAGEAL ECHOCARDIOGRAM (TEE);  Surgeon: Wendall Stade, MD;  Location: [facility];  Service: Cardiovascular;  Laterality: N/A;   TEE WITHOUT CARDIOVERSION N/A 11/28/2018   Procedure: TRANSESOPHAGEAL ECHOCARDIOGRAM (TEE);  Surgeon: Purcell Nails, MD;  Location: [facility] OR;  Service: Open Heart Surgery;  Laterality: N/A;   TUBAL LIGATION     WISDOM TOOTH EXTRACTION     Patient Active Problem List   Diagnosis Date Noted   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   Blurred vision, bilateral 10/27/2022   Patellar instability of left knee 10/04/2022  Primary osteoarthritis of left knee 09/07/2022   Primary hypertriglyceridemia 03/06/2020   Hyperglycemia 03/05/2020   Narrowing of intervertebral disc space 06/18/2019   Cardiomyopathy (HCC) 01/17/2019   Chronic diastolic CHF (congestive heart failure) (HCC) 12/05/2018   Essential hypertension 07/10/2018   Other specified hypothyroidism 09/22/2014   Visit for screening mammogram 09/22/2014   Insomnia, persistent 03/19/2014   Lynch syndrome 11/28/2012   MR (mitral regurgitation)  10/28/2011   MVP (mitral valve prolapse) 10/28/2011   ALLERGIC RHINITIS 11/06/2007   GERD 11/06/2007    PCP: Dr [name]   REFERRING PROVIDER: Dr [name]   REFERRING DIAG:  Diagnosis  M25.362 (ICD-10-CM) - Patellar instability of left knee   1.  Left knee tibial tubercle osteotomy 2.  Left knee matrix associated autologous chondrocyte implantation medial patella facet    THERAPY DIAG:  Acute pain of left knee  Stiffness of left knee, not elsewhere classified  Other abnormalities of gait and mobility  Localized edema  Rationale for Evaluation and Treatment: Rehabilitation  ONSET DATE: 6/18 surgical procedure. Patient having pain since 2008   SUBJECTIVE:   SUBJECTIVE STATEMENT: Just still painful in the AM.   PERTINENT HISTORY: ACL repair, migraines, athritis of the knee, mitral valve prolapse ,  PAIN:  Are you having pain? Yes: NPRS scale: 3/10 Pain location: medial left knee  Pain description: sharp at times  Aggravating factors: being up  Relieving factors: rest/ pain medication   PRECAUTIONS: Kneefollow MACI protocol   WEIGHT BEARING RESTRICTIONS: Yes TDWB   FALLS:  Has patient fallen in last 6 months? No  LIVING ENVIRONMENT: 1 step into the house. OCCUPATION:  Child psychotherapist   Hobbies:    PLOF: Independent  PATIENT GOALS:   NEXT MD VISIT: 2 weeks    OBJECTIVE:   PATIENT SURVEYS:  FOTO eval: 8  EDEMA:  7/5 knee circumferential 42.5 cm  LOWER EXTREMITY ROM:  Passive ROM Left eval Left 6/27  Hip flexion    Hip extension    Hip abduction    Hip adduction    Hip internal rotation    Hip external rotation    Knee flexion 36 61  Knee extension -3 0  Ankle dorsiflexion    Ankle plantarflexion    Ankle inversion    Ankle eversion     (Blank rows = not tested)  LOWER EXTREMITY MMT:  MMT Right eval Left eval  Hip flexion    Hip extension    Hip abduction    Hip adduction    Hip internal rotation    Hip external  rotation    Knee flexion    Knee extension    Ankle dorsiflexion    Ankle plantarflexion    Ankle inversion    Ankle eversion     (Blank rows = not tested) Not tested 2nd to recent surgery     GAIT: 7/5: TDWB with RW and brace locked in ext  TODAY'S TREATMENT:  0-6 weeks:  beginning at 0-  40 ; advance 5-  10 deg daily as  tolerated   Treatment                            7/5:  PROM flexion Supine quad set Sidelying clams Prone HS curl Gait without AD Assisted SLR and pt was able to lower independently   6/27:  Dressing change PROM within pain limits (61deg flexion today) Quad sets 2x10 5s hold Supine SLR with manual assist 1x10 Sidelying hip abduction 2x10 Glute sets 5s x10 Review of HEP Review of precautions/WB status/proper brace adjustment   PATIENT EDUCATION:  Education details: HEP, symptom management; importance of weight bearing  Person educated: Patient Education method: Explanation, Demonstration, Tactile cues, Verbal cues, and Handouts Education comprehension: verbalized understanding, returned demonstration, verbal cues required, tactile cues required, and needs further education  HOME EXERCISE PROGRAM: Access Code: [email or web address] URL: [email or web address]    ASSESSMENT:  CLINICAL IMPRESSION: Able to progress of of AD with brace locked in ext today. Pt denied pain in ambulation. Unable to perform independent SLR necessary for strength to unlock brace.    OBJECTIVE IMPAIRMENTS: Abnormal gait, decreased activity tolerance, decreased balance, decreased knowledge of condition, decreased knowledge of use of DME, difficulty walking, decreased ROM, decreased strength, and pain.   ACTIVITY LIMITATIONS: carrying, lifting, bending, sitting, standing, squatting, sleeping, stairs, transfers, bed mobility, bathing, toileting,  dressing, reach over head, hygiene/grooming, and locomotion level  PARTICIPATION LIMITATIONS: meal prep, cleaning, laundry, driving, shopping, community activity, occupation, and yard work  PERSONAL FACTORS: 1-2 comorbidities: Mitral valve repair, ACL repair on the right side  are also affecting patient's functional outcome.   REHAB POTENTIAL: Excellent  CLINICAL DECISION MAKING: Stable/uncomplicated  EVALUATION COMPLEXITY: Low   GOALS: Goals reviewed with patient? Yes  SHORT TERM GOALS: Target date: 01/19/2023   Patient will increase passive left knee flexion by 10 degrees a week until full flexion is met Baseline: Goal status: INITIAL  2.  Patient will demonstrate full active left knee extension Baseline:  Goal status: INITIAL  3.  Patient will progress off walker when cleared by MD for weightbearing Baseline:  Goal status: INITIAL  4.  Patient will be independent with basic HEP Baseline:  Goal status: INITIAL  LONG TERM GOALS: Target date: 02/16/2023    Patient will go up and down 8 steps with reciprocal gait pattern Baseline:  Goal status: INITIAL  2.  Patient will ambulate community distances without increased left knee pain Baseline:  Goal status: INITIAL  3.  Patient will have a complete HEP to promote further left knee strengthening Baseline:  Goal status: INITIAL   PLAN:  PT FREQUENCY: 2x/week  PT DURATION: 12 weeks  PLANNED INTERVENTIONS: Therapeutic exercises, Therapeutic activity, Neuromuscular re-education, Balance training, Gait training, Patient/Family education, Self Care, Joint mobilization, Stair training, DME instructions, Aquatic Therapy, Dry Needling, Cryotherapy, Moist heat, Taping, Ultrasound, and Manual therapy  PLAN FOR NEXT SESSION: follow  cartilagep-p/ restoration protocol    Carrie Usery C. Pedro Oldenburg PT, DPT 01/06/23 11:38 AM

## 2023-01-10 ENCOUNTER — Other Ambulatory Visit (HOSPITAL_BASED_OUTPATIENT_CLINIC_OR_DEPARTMENT_OTHER): Payer: Self-pay

## 2023-01-11 ENCOUNTER — Other Ambulatory Visit (HOSPITAL_BASED_OUTPATIENT_CLINIC_OR_DEPARTMENT_OTHER): Payer: Self-pay

## 2023-01-11 ENCOUNTER — Other Ambulatory Visit (HOSPITAL_BASED_OUTPATIENT_CLINIC_OR_DEPARTMENT_OTHER): Payer: Self-pay | Admitting: Orthopaedic Surgery

## 2023-01-12 ENCOUNTER — Other Ambulatory Visit (HOSPITAL_BASED_OUTPATIENT_CLINIC_OR_DEPARTMENT_OTHER): Payer: Self-pay

## 2023-01-12 MED ORDER — OXYCODONE HCL 5 MG PO TABS
5.0000 mg | ORAL_TABLET | ORAL | 0 refills | Status: DC | PRN
  Filled 2023-01-12: qty 20, 4d supply, fill #0

## 2023-01-13 ENCOUNTER — Encounter (HOSPITAL_BASED_OUTPATIENT_CLINIC_OR_DEPARTMENT_OTHER): Payer: Self-pay | Admitting: Physical Therapy

## 2023-01-13 ENCOUNTER — Ambulatory Visit (HOSPITAL_BASED_OUTPATIENT_CLINIC_OR_DEPARTMENT_OTHER): Payer: 59 | Admitting: Physical Therapy

## 2023-01-13 ENCOUNTER — Other Ambulatory Visit (HOSPITAL_BASED_OUTPATIENT_CLINIC_OR_DEPARTMENT_OTHER): Payer: Self-pay

## 2023-01-13 DIAGNOSIS — R600 Localized edema: Secondary | ICD-10-CM

## 2023-01-13 DIAGNOSIS — R2689 Other abnormalities of gait and mobility: Secondary | ICD-10-CM

## 2023-01-13 DIAGNOSIS — M25562 Pain in left knee: Secondary | ICD-10-CM | POA: Diagnosis not present

## 2023-01-13 DIAGNOSIS — M25662 Stiffness of left knee, not elsewhere classified: Secondary | ICD-10-CM

## 2023-01-13 NOTE — Therapy (Signed)
OUTPATIENT PHYSICAL THERAPY LOWER EXTREMITY Treatment   Patient Name: [name] MRN: [id number] DOB:[birth year], 47 y.o., female Today's Date: 01/13/2023  END OF SESSION:  PT End of Session - 01/13/23 1103     Visit Number 4    Number of Visits 16    Date for PT Re-Evaluation 02/17/23    PT Start Time 1102    PT Stop Time 1141    PT Time Calculation (min) 39 min    Activity Tolerance Patient tolerated treatment well    Behavior During Therapy [facility] for tasks assessed/performed                Past Medical History:  Diagnosis Date   Adenomatous colon polyp 12/2012   Allergic rhinitis    Arthritis    Cellulitis    Common migraine    Diastolic dysfunction    Dysrhythmia    External hemorrhoid    Genital herpes    GERD (gastroesophageal reflux disease)    Hx of migraines    Hyperlipidemia    Hypertension    Lynch syndrome    Mitral regurgitation    MVP (mitral valve prolapse)    Plica syndrome of left knee    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   32 mm Sorin Memo 4D ring annuloplasty via right mini thoracotomy approach   Past Surgical History:  Procedure Laterality Date   ABDOMINAL HYSTERECTOMY     CHOLECYSTECTOMY N/A 01/16/2020   Procedure: LAPAROSCOPIC CHOLECYSTECTOMY WITH INTRAOPERATIVE CHOLANGIOGRAM;  Surgeon: Darnell Level, MD;  Location: [facility];  Service: General;  Laterality: N/A;   COLONOSCOPY     KNEE ARTHROSCOPY WITH MACI CARTILAGE HARVEST Left 10/04/2022   Procedure: LEFT KNEE ARTHROSCOPY WITH MACI CARTILAGE HARVEST;  Surgeon: [name], MD;  Location: [facility];  Service: Orthopedics;  Laterality: Left;   KNEE ARTHROSCOPY WITH MEDIAL MENISECTOMY Left 06/07/2013   Procedure: LEFT KNEE ARTHROSCOPY WITH SYNOVECTOMY LIMITED, ARTHROSCOPY KNEE WITH DEBRIDEMENT/SHAVING (CHONDROPLASTY), ARTHROSCOPY KNEE  ChondroplastyWITH MEDIAL AND LATERAL  MENISECTOMY;  Surgeon: Sheral Apley, MD;  Location: [facility];  Service: Orthopedics;  Laterality: Left;   KNEE ARTHROSCOPY WITH MEDIAL PATELLAR FEMORAL LIGAMENT RECONSTRUCTION Left 12/20/2022   Procedure: KNEE ARTHROSCOPY WITH MEDIAL PATELLAR FEMORAL LIGAMENT RECONSTRUCTION;  Surgeon: [name], MD;  Location: [facility];  Service: Orthopedics;  Laterality: Left;   left knee arthroscopy     MITRAL VALVE REPAIR Right 11/28/2018   Procedure: MINIMALLY INVASIVE MITRAL VALVE REPAIR (MVR) - Using Memo 4D Ring Size 32;  Surgeon: Purcell Nails, MD;  Location: [facility] OR;  Service: Open Heart Surgery;  Laterality: Right;   patrial hysterectomy     POLYPECTOMY     SALPINGOOPHORECTOMY  2022   SYNOVECTOMY Left 06/07/2013   Procedure: SYNOVECTOMY;  Surgeon: Sheral Apley, MD;  Location: [facility];  Service: Orthopedics;  Laterality: Left;   TEE WITHOUT CARDIOVERSION  10/11/2011   Procedure: TRANSESOPHAGEAL ECHOCARDIOGRAM (TEE);  Surgeon: Wendall Stade, MD;  Location: [facility];  Service: Cardiovascular;  Laterality: N/A;   TEE WITHOUT CARDIOVERSION N/A 11/28/2018   Procedure: TRANSESOPHAGEAL ECHOCARDIOGRAM (TEE);  Surgeon: Purcell Nails, MD;  Location: [facility] OR;  Service: Open Heart Surgery;  Laterality: N/A;   TUBAL LIGATION     WISDOM TOOTH EXTRACTION     Patient Active Problem List   Diagnosis Date Noted   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   Blurred vision, bilateral 10/27/2022   Patellar instability of left knee  10/04/2022   Primary osteoarthritis of left knee 09/07/2022   Primary hypertriglyceridemia 03/06/2020   Hyperglycemia 03/05/2020   Narrowing of intervertebral disc space 06/18/2019   Cardiomyopathy (HCC) 01/17/2019   Chronic diastolic CHF (congestive heart failure) (HCC) 12/05/2018   Essential hypertension 07/10/2018   Other specified hypothyroidism 09/22/2014   Visit for screening mammogram 09/22/2014   Insomnia, persistent 03/19/2014   Lynch syndrome 11/28/2012   MR (mitral regurgitation)  10/28/2011   MVP (mitral valve prolapse) 10/28/2011   ALLERGIC RHINITIS 11/06/2007   GERD 11/06/2007    PCP: Dr [name]   REFERRING PROVIDER: Dr [name]   REFERRING DIAG:  Diagnosis  M25.362 (ICD-10-CM) - Patellar instability of left knee   1.  Left knee tibial tubercle osteotomy 2.  Left knee matrix associated autologous chondrocyte implantation medial patella facet    THERAPY DIAG:  Acute pain of left knee  Stiffness of left knee, not elsewhere classified  Other abnormalities of gait and mobility  Localized edema  Rationale for Evaluation and Treatment: Rehabilitation  ONSET DATE: 6/18 surgical procedure. Patient having pain since 2008   SUBJECTIVE:   SUBJECTIVE STATEMENT: Medial knee pain limiting sleep. Sometimes I can help lift it but sometimes it just doesn't seem like it works.   PERTINENT HISTORY: ACL repair, migraines, athritis of the knee, mitral valve prolapse ,  PAIN:  Are you having pain? Yes: NPRS scale: 0/10 Pain location: medial left knee  Pain description: sharp at times  Aggravating factors: being up  Relieving factors: rest/ pain medication   PRECAUTIONS: Kneefollow MACI protocol   WEIGHT BEARING RESTRICTIONS: Yes TDWB   FALLS:  Has patient fallen in last 6 months? No  LIVING ENVIRONMENT: 1 step into the house. OCCUPATION:  Child psychotherapist   PLOF: Independent  PATIENT GOALS:   NEXT MD VISIT: 2 weeks    OBJECTIVE:   PATIENT SURVEYS:  FOTO eval: 8  EDEMA:  7/5 knee circumferential 42.5 cm  LOWER EXTREMITY ROM:  Passive ROM Left eval Left 6/27 Left  7/12  Hip flexion     Hip extension     Hip abduction     Hip adduction     Hip internal rotation     Hip external rotation     Knee flexion 36 61 95  Knee extension -3 0 0  Ankle dorsiflexion     Ankle plantarflexion     Ankle inversion     Ankle eversion      (Blank rows = not tested)  LOWER EXTREMITY MMT:  MMT Right eval Left eval  Hip flexion     Hip extension    Hip abduction    Hip adduction    Hip internal rotation    Hip external rotation    Knee flexion    Knee extension    Ankle dorsiflexion    Ankle plantarflexion    Ankle inversion    Ankle eversion     (Blank rows = not tested) Not tested 2nd to recent surgery     GAIT: 7/5: TDWB with RW and brace locked in ext  TODAY'S TREATMENT:  0-6 weeks:  beginning at 0-  40 ; advance 5-  10 deg daily as  tolerated   Treatment                            7/12:  Heel slides with strap in supine Supine HS & ITB stretch with strap SLR neutral & turnout with core engagement.  SL hip abd, circles, arcs SL hip add Gait: heel strike with circumduction to reduce adducted stance phase   Treatment                            7/5:  PROM flexion Supine quad set Sidelying clams Prone HS curl Gait without AD Assisted SLR and pt was able to lower independently   6/27:  Dressing change PROM within pain limits (61deg flexion today) Quad sets 2x10 5s hold Supine SLR with manual assist 1x10 Sidelying hip abduction 2x10 Glute sets 5s x10 Review of HEP Review of precautions/WB status/proper brace adjustment   PATIENT EDUCATION:  Education details: HEP, symptom management; importance of weight bearing  Person educated: Patient Education method: Explanation, Demonstration, Tactile cues, Verbal cues, and Handouts Education comprehension: verbalized understanding, returned demonstration, verbal cues required, tactile cues required, and needs further education  HOME EXERCISE PROGRAM: Access Code: [email or web address] URL: [email or web address]    ASSESSMENT:  CLINICAL IMPRESSION: Excellent strength and ROM progression, will continue as appropriate.    OBJECTIVE IMPAIRMENTS: Abnormal gait, decreased activity tolerance, decreased balance,  decreased knowledge of condition, decreased knowledge of use of DME, difficulty walking, decreased ROM, decreased strength, and pain.   ACTIVITY LIMITATIONS: carrying, lifting, bending, sitting, standing, squatting, sleeping, stairs, transfers, bed mobility, bathing, toileting, dressing, reach over head, hygiene/grooming, and locomotion level  PARTICIPATION LIMITATIONS: meal prep, cleaning, laundry, driving, shopping, community activity, occupation, and yard work  PERSONAL FACTORS: 1-2 comorbidities: Mitral valve repair, ACL repair on the right side  are also affecting patient's functional outcome.   REHAB POTENTIAL: Excellent  CLINICAL DECISION MAKING: Stable/uncomplicated  EVALUATION COMPLEXITY: Low   GOALS: Goals reviewed with patient? Yes  SHORT TERM GOALS: Target date: 01/19/2023   Patient will increase passive left knee flexion by 10 degrees a week until full flexion is met Baseline: Goal status: INITIAL  2.  Patient will demonstrate full active left knee extension Baseline:  Goal status: achieved  3.  Patient will progress off walker when cleared by MD for weightbearing Baseline:  Goal status:achieved  4.  Patient will be independent with basic HEP Baseline:  Goal status: INITIAL  LONG TERM GOALS: Target date: 02/16/2023    Patient will go up and down 8 steps with reciprocal gait pattern Baseline:  Goal status: INITIAL  2.  Patient will ambulate community distances without increased left knee pain Baseline:  Goal status: INITIAL  3.  Patient will have a complete HEP to promote further left knee strengthening Baseline:  Goal status: INITIAL   PLAN:  PT FREQUENCY: 2x/week  PT DURATION: 12 weeks  PLANNED INTERVENTIONS: Therapeutic exercises, Therapeutic activity, Neuromuscular re-education, Balance training, Gait training, Patient/Family education, Self Care, Joint mobilization, Stair training, DME instructions, Aquatic Therapy, Dry Needling, Cryotherapy,  Moist heat, Taping, Ultrasound, and Manual therapy  PLAN FOR NEXT SESSION: follow  cartilagep-p/ restoration protocol    Shoshannah Faubert C. Jayveion Stalling PT, DPT 01/13/23 11:42 AM

## 2023-01-14 ENCOUNTER — Other Ambulatory Visit (HOSPITAL_BASED_OUTPATIENT_CLINIC_OR_DEPARTMENT_OTHER): Payer: Self-pay

## 2023-01-16 ENCOUNTER — Other Ambulatory Visit (HOSPITAL_BASED_OUTPATIENT_CLINIC_OR_DEPARTMENT_OTHER): Payer: Self-pay

## 2023-01-17 ENCOUNTER — Ambulatory Visit (HOSPITAL_BASED_OUTPATIENT_CLINIC_OR_DEPARTMENT_OTHER): Payer: 59

## 2023-01-17 ENCOUNTER — Encounter (HOSPITAL_BASED_OUTPATIENT_CLINIC_OR_DEPARTMENT_OTHER): Payer: Self-pay

## 2023-01-17 DIAGNOSIS — M25662 Stiffness of left knee, not elsewhere classified: Secondary | ICD-10-CM

## 2023-01-17 DIAGNOSIS — R2689 Other abnormalities of gait and mobility: Secondary | ICD-10-CM

## 2023-01-17 DIAGNOSIS — M25562 Pain in left knee: Secondary | ICD-10-CM | POA: Diagnosis not present

## 2023-01-17 DIAGNOSIS — R600 Localized edema: Secondary | ICD-10-CM

## 2023-01-17 NOTE — Therapy (Signed)
OUTPATIENT PHYSICAL THERAPY LOWER EXTREMITY Treatment   Patient Name: [name] MRN: [id number] DOB:07-Jul-1975, 47 y.o., female Today's Date: 01/17/2023  END OF SESSION:  PT End of Session - 01/17/23 1307     Visit Number 5    Number of Visits 16    Date for PT Re-Evaluation 02/17/23    PT Start Time 1105    PT Stop Time 1145    PT Time Calculation (min) 40 min    Activity Tolerance Patient tolerated treatment well    Behavior During Therapy [facility] for tasks assessed/performed                 Past Medical History:  Diagnosis Date   Adenomatous colon polyp 12/2012   Allergic rhinitis    Arthritis    Cellulitis    Common migraine    Diastolic dysfunction    Dysrhythmia    External hemorrhoid    Genital herpes    GERD (gastroesophageal reflux disease)    Hx of migraines    Hyperlipidemia    Hypertension    Lynch syndrome    Mitral regurgitation    MVP (mitral valve prolapse)    Plica syndrome of left knee    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   32 mm Sorin Memo 4D ring annuloplasty via right mini thoracotomy approach   Past Surgical History:  Procedure Laterality Date   ABDOMINAL HYSTERECTOMY     CHOLECYSTECTOMY N/A 01/16/2020   Procedure: LAPAROSCOPIC CHOLECYSTECTOMY WITH INTRAOPERATIVE CHOLANGIOGRAM;  Surgeon: Darnell Level, MD;  Location: [facility];  Service: General;  Laterality: N/A;   COLONOSCOPY     KNEE ARTHROSCOPY WITH MACI CARTILAGE HARVEST Left 10/04/2022   Procedure: LEFT KNEE ARTHROSCOPY WITH MACI CARTILAGE HARVEST;  Surgeon: [name], MD;  Location: [facility];  Service: Orthopedics;  Laterality: Left;   KNEE ARTHROSCOPY WITH MEDIAL MENISECTOMY Left 06/07/2013   Procedure: LEFT KNEE ARTHROSCOPY WITH SYNOVECTOMY LIMITED, ARTHROSCOPY KNEE WITH DEBRIDEMENT/SHAVING (CHONDROPLASTY), ARTHROSCOPY KNEE  ChondroplastyWITH MEDIAL AND LATERAL  MENISECTOMY;  Surgeon: Sheral Apley, MD;  Location: [facility];  Service: Orthopedics;  Laterality: Left;   KNEE ARTHROSCOPY WITH MEDIAL PATELLAR FEMORAL LIGAMENT RECONSTRUCTION Left 12/20/2022   Procedure: KNEE ARTHROSCOPY WITH MEDIAL PATELLAR FEMORAL LIGAMENT RECONSTRUCTION;  Surgeon: [name], MD;  Location: [facility];  Service: Orthopedics;  Laterality: Left;   left knee arthroscopy     MITRAL VALVE REPAIR Right 11/28/2018   Procedure: MINIMALLY INVASIVE MITRAL VALVE REPAIR (MVR) - Using Memo 4D Ring Size 32;  Surgeon: Purcell Nails, MD;  Location: [facility] OR;  Service: Open Heart Surgery;  Laterality: Right;   patrial hysterectomy     POLYPECTOMY     SALPINGOOPHORECTOMY  2022   SYNOVECTOMY Left 06/07/2013   Procedure: SYNOVECTOMY;  Surgeon: Sheral Apley, MD;  Location: [facility];  Service: Orthopedics;  Laterality: Left;   TEE WITHOUT CARDIOVERSION  10/11/2011   Procedure: TRANSESOPHAGEAL ECHOCARDIOGRAM (TEE);  Surgeon: Wendall Stade, MD;  Location: [facility];  Service: Cardiovascular;  Laterality: N/A;   TEE WITHOUT CARDIOVERSION N/A 11/28/2018   Procedure: TRANSESOPHAGEAL ECHOCARDIOGRAM (TEE);  Surgeon: Purcell Nails, MD;  Location: [facility] OR;  Service: Open Heart Surgery;  Laterality: N/A;   TUBAL LIGATION     WISDOM TOOTH EXTRACTION     Patient Active Problem List   Diagnosis Date Noted   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   Blurred vision, bilateral 10/27/2022   Patellar instability of left  knee 10/04/2022   Primary osteoarthritis of left knee 09/07/2022   Primary hypertriglyceridemia 03/06/2020   Hyperglycemia 03/05/2020   Narrowing of intervertebral disc space 06/18/2019   Cardiomyopathy (HCC) 01/17/2019   Chronic diastolic CHF (congestive heart failure) (HCC) 12/05/2018   Essential hypertension 07/10/2018   Other specified hypothyroidism 09/22/2014   Visit for screening mammogram 09/22/2014   Insomnia, persistent 03/19/2014   Lynch syndrome 11/28/2012   MR (mitral regurgitation)  10/28/2011   MVP (mitral valve prolapse) 10/28/2011   ALLERGIC RHINITIS 11/06/2007   GERD 11/06/2007    PCP: Dr [name]   REFERRING PROVIDER: Dr [name]   REFERRING DIAG:  Diagnosis  M25.362 (ICD-10-CM) - Patellar instability of left knee   1.  Left knee tibial tubercle osteotomy 2.  Left knee matrix associated autologous chondrocyte implantation medial patella facet    THERAPY DIAG:  Acute pain of left knee  Other abnormalities of gait and mobility  Stiffness of left knee, not elsewhere classified  Localized edema  Rationale for Evaluation and Treatment: Rehabilitation  ONSET DATE: 6/18 surgical procedure. Patient having pain since 2008   SUBJECTIVE:   SUBJECTIVE STATEMENT: Pt reports continued swelling and discomfort.  Has calf pain, but only in the mornings when first getting out of bed.   PERTINENT HISTORY: ACL repair, migraines, athritis of the knee, mitral valve prolapse ,  PAIN:  Are you having pain? Yes: NPRS scale: 0/10 Pain location: medial left knee  Pain description: sharp at times  Aggravating factors: being up  Relieving factors: rest/ pain medication   PRECAUTIONS: Kneefollow MACI protocol   WEIGHT BEARING RESTRICTIONS: Yes TDWB   FALLS:  Has patient fallen in last 6 months? No  LIVING ENVIRONMENT: 1 step into the house. OCCUPATION:  Child psychotherapist   PLOF: Independent  PATIENT GOALS:   NEXT MD VISIT: 2 weeks    OBJECTIVE:   PATIENT SURVEYS:  FOTO eval: 8  EDEMA:  7/5 knee circumferential 42.5 cm  LOWER EXTREMITY ROM:  Passive ROM Left eval Left 6/27 Left  7/12  Hip flexion     Hip extension     Hip abduction     Hip adduction     Hip internal rotation     Hip external rotation     Knee flexion 36 61 95  Knee extension -3 0 0  Ankle dorsiflexion     Ankle plantarflexion     Ankle inversion     Ankle eversion      (Blank rows = not tested)  LOWER EXTREMITY MMT:  MMT Right eval Left eval  Hip  flexion    Hip extension    Hip abduction    Hip adduction    Hip internal rotation    Hip external rotation    Knee flexion    Knee extension    Ankle dorsiflexion    Ankle plantarflexion    Ankle inversion    Ankle eversion     (Blank rows = not tested) Not tested 2nd to recent surgery     GAIT: 7/5: TDWB with RW and brace locked in ext  TODAY'S TREATMENT:  0-6 weeks:  beginning at 0-  40 ; advance 5-  10 deg daily as  tolerated   Treatment                            7/16   Assessed calf pain- negative Homans STM posterior knee IASTM to quad (roller) PROM L knee Supine HS stretch with strap Standing gastroc and soleus stretch 20sec  SLR neutral x8 (began to have 7/10 pain level) SL hip circles 2x10ea Gait: cues for L toe off- 334ft  Treatment                            7/12:  Heel slides with strap in supine Supine HS & ITB stretch with strap SLR neutral & turnout with core engagement.  SL hip abd, circles, arcs SL hip add Gait: heel strike with circumduction to reduce adducted stance phase   Treatment                            7/5:  PROM flexion Supine quad set Sidelying clams Prone HS curl Gait without AD Assisted SLR and pt was able to lower independently   6/27:  Dressing change PROM within pain limits (61deg flexion today) Quad sets 2x10 5s hold Supine SLR with manual assist 1x10 Sidelying hip abduction 2x10 Glute sets 5s x10 Review of HEP Review of precautions/WB status/proper brace adjustment   PATIENT EDUCATION:  Education details: HEP, symptom management; importance of weight bearing  Person educated: Patient Education method: Explanation, Demonstration, Tactile cues, Verbal cues, and Handouts Education comprehension: verbalized understanding, returned demonstration, verbal cues required, tactile cues  required, and needs further education  HOME EXERCISE PROGRAM: Access Code: [email or web address] URL: [email or web address]    ASSESSMENT:  CLINICAL IMPRESSION: Negative Homan sign. Pt likely tight into calf mm in the morning. Instructed in stretches to perform to aid with this. If pain worsens she was instructed to call MD office. Pt has ~90 deg flexion available passively. Able to walk without brace with improving gait pattern, though toe off still lacking compared to R side. She ambulated out of clinic with brace doffed. Discussed modifications to activity level and suggested occasional elevation after periods of being on her feet. Will continue to progress as tolerated.     OBJECTIVE IMPAIRMENTS: Abnormal gait, decreased activity tolerance, decreased balance, decreased knowledge of condition, decreased knowledge of use of DME, difficulty walking, decreased ROM, decreased strength, and pain.   ACTIVITY LIMITATIONS: carrying, lifting, bending, sitting, standing, squatting, sleeping, stairs, transfers, bed mobility, bathing, toileting, dressing, reach over head, hygiene/grooming, and locomotion level  PARTICIPATION LIMITATIONS: meal prep, cleaning, laundry, driving, shopping, community activity, occupation, and yard work  PERSONAL FACTORS: 1-2 comorbidities: Mitral valve repair, ACL repair on the right side  are also affecting patient's functional outcome.   REHAB POTENTIAL: Excellent  CLINICAL DECISION MAKING: Stable/uncomplicated  EVALUATION COMPLEXITY: Low   GOALS: Goals reviewed with patient? Yes  SHORT TERM GOALS: Target date: 01/19/2023   Patient will increase passive left knee flexion by 10 degrees a week until full flexion is met Baseline: Goal status: INITIAL  2.  Patient will demonstrate full active left knee extension Baseline:  Goal status: achieved  3.  Patient will progress off walker when cleared by MD for weightbearing Baseline:  Goal  status:achieved  4.  Patient will be independent with basic HEP Baseline:  Goal status: INITIAL  LONG TERM GOALS: Target date: 02/16/2023    Patient will go up and down 8 steps with reciprocal gait pattern Baseline:  Goal status: INITIAL  2.  Patient will ambulate community distances without increased left knee pain Baseline:  Goal status: INITIAL  3.  Patient will have a complete HEP to promote further left knee strengthening Baseline:  Goal status: INITIAL   PLAN:  PT FREQUENCY: 2x/week  PT DURATION: 12 weeks  PLANNED INTERVENTIONS: Therapeutic exercises, Therapeutic activity, Neuromuscular re-education, Balance training, Gait training, Patient/Family education, Self Care, Joint mobilization, Stair training, DME instructions, Aquatic Therapy, Dry Needling, Cryotherapy, Moist heat, Taping, Ultrasound, and Manual therapy  PLAN FOR NEXT SESSION: follow  cartilagep-p/ restoration protocol    Riki Altes, PTA  01/17/23 1:31 PM

## 2023-01-19 ENCOUNTER — Ambulatory Visit: Payer: 59 | Admitting: Internal Medicine

## 2023-01-19 ENCOUNTER — Ambulatory Visit (HOSPITAL_BASED_OUTPATIENT_CLINIC_OR_DEPARTMENT_OTHER): Payer: 59

## 2023-01-23 ENCOUNTER — Other Ambulatory Visit (HOSPITAL_BASED_OUTPATIENT_CLINIC_OR_DEPARTMENT_OTHER): Payer: Self-pay

## 2023-01-23 ENCOUNTER — Encounter (HOSPITAL_BASED_OUTPATIENT_CLINIC_OR_DEPARTMENT_OTHER): Payer: Self-pay

## 2023-01-23 ENCOUNTER — Ambulatory Visit (INDEPENDENT_AMBULATORY_CARE_PROVIDER_SITE_OTHER): Payer: 59 | Admitting: Internal Medicine

## 2023-01-23 ENCOUNTER — Other Ambulatory Visit (HOSPITAL_BASED_OUTPATIENT_CLINIC_OR_DEPARTMENT_OTHER): Payer: Self-pay | Admitting: Student

## 2023-01-23 ENCOUNTER — Other Ambulatory Visit (HOSPITAL_BASED_OUTPATIENT_CLINIC_OR_DEPARTMENT_OTHER): Payer: Self-pay | Admitting: Orthopaedic Surgery

## 2023-01-23 VITALS — BP 126/78 | HR 83 | Temp 98.0°F | Resp 16 | Ht 63.0 in | Wt 193.0 lb

## 2023-01-23 DIAGNOSIS — I5032 Chronic diastolic (congestive) heart failure: Secondary | ICD-10-CM

## 2023-01-23 DIAGNOSIS — R739 Hyperglycemia, unspecified: Secondary | ICD-10-CM | POA: Diagnosis not present

## 2023-01-23 DIAGNOSIS — T502X5A Adverse effect of carbonic-anhydrase inhibitors, benzothiadiazides and other diuretics, initial encounter: Secondary | ICD-10-CM | POA: Insufficient documentation

## 2023-01-23 DIAGNOSIS — I10 Essential (primary) hypertension: Secondary | ICD-10-CM | POA: Diagnosis not present

## 2023-01-23 DIAGNOSIS — E876 Hypokalemia: Secondary | ICD-10-CM | POA: Diagnosis not present

## 2023-01-23 DIAGNOSIS — E6609 Other obesity due to excess calories: Secondary | ICD-10-CM

## 2023-01-23 DIAGNOSIS — Z6834 Body mass index (BMI) 34.0-34.9, adult: Secondary | ICD-10-CM

## 2023-01-23 DIAGNOSIS — E66811 Obesity, class 1: Secondary | ICD-10-CM | POA: Insufficient documentation

## 2023-01-23 MED ORDER — SEMAGLUTIDE-WEIGHT MANAGEMENT 2.4 MG/0.75ML ~~LOC~~ SOAJ
2.4000 mg | SUBCUTANEOUS | 0 refills | Status: AC
Start: 2023-05-19 — End: 2023-06-24
  Filled 2023-01-23 – 2023-05-24 (×3): qty 3, 28d supply, fill #0

## 2023-01-23 MED ORDER — SEMAGLUTIDE-WEIGHT MANAGEMENT 1 MG/0.5ML ~~LOC~~ SOAJ
1.0000 mg | SUBCUTANEOUS | 0 refills | Status: AC
Start: 2023-03-22 — End: 2023-04-19
  Filled 2023-01-23 – 2023-03-18 (×2): qty 2, 28d supply, fill #0

## 2023-01-23 MED ORDER — OXYCODONE HCL 5 MG PO TABS
5.0000 mg | ORAL_TABLET | ORAL | 0 refills | Status: DC | PRN
  Filled 2023-01-23: qty 20, 4d supply, fill #0

## 2023-01-23 MED ORDER — SEMAGLUTIDE-WEIGHT MANAGEMENT 1.7 MG/0.75ML ~~LOC~~ SOAJ
1.7000 mg | SUBCUTANEOUS | 0 refills | Status: AC
Start: 2023-04-20 — End: 2023-05-18
  Filled 2023-01-23 – 2023-05-03 (×3): qty 3, 28d supply, fill #0

## 2023-01-23 MED ORDER — SEMAGLUTIDE-WEIGHT MANAGEMENT 0.25 MG/0.5ML ~~LOC~~ SOAJ
0.2500 mg | SUBCUTANEOUS | 0 refills | Status: AC
Start: 2023-01-23 — End: 2023-02-22
  Filled 2023-01-23: qty 2, 28d supply, fill #0

## 2023-01-23 MED ORDER — SEMAGLUTIDE-WEIGHT MANAGEMENT 0.5 MG/0.5ML ~~LOC~~ SOAJ
0.5000 mg | SUBCUTANEOUS | 0 refills | Status: AC
Start: 2023-02-21 — End: 2023-03-21
  Filled 2023-01-23 – 2023-02-14 (×2): qty 2, 28d supply, fill #0

## 2023-01-23 NOTE — Progress Notes (Signed)
Subjective:  Patient ID: [name], female    DOB: Aug 28, 1975  Age: 47 y.o. MRN: [id number]  CC: Hypertension and Congestive Heart Failure   HPI [name] presents for f/up ----  Discussed the use of AI scribe software for clinical note transcription with the patient, who gave verbal consent to proceed.  History of Present Illness   The patient, with a history of Lynch syndrome, open heart surgery, gallbladder removal, and hysterectomy, presented with a primary interest in starting [facility]. She was unsure if her insurance would cover the medication.  The patient had undergone knee surgery a month prior and was experiencing ongoing pain. She had been managing the pain with Tylenol and oxycodone, but had run out of the latter a week ago. The pain had been manageable until the day of the consultation, when she woke up in the early hours of the morning with significant discomfort.  The patient also reported a history of smoking, but not drinking, and was unsure if recent weight gain was due to fluid retention despite taking a diuretic. She did not observe any visible fluid retention.  The patient had been experiencing headaches, but these were not chronic and seemed to be related to not eating. She was taking several medications, including torsemide and Ubrelvy as needed, and a potassium supplement due to previously low levels. She had not yet started taking meloxicam, an anti-inflammatory, and had run out of oxycodone.  The patient's potassium levels were still low on the day of her knee surgery, despite taking the supplement and eating a banana daily as advised.       Outpatient Medications Prior to Visit  Medication Sig Dispense Refill   aspirin EC 325 MG tablet Take 1 tablet (325 mg total) by mouth daily. 30 tablet 0   estradiol (VIVELLE-DOT) 0.1 MG/24HR patch Place 1 patch onto the skin 2 (two) times a week.     estradiol (VIVELLE-DOT) 0.1 MG/24HR patch Place 1  patch (0.1 mg total) onto the skin 2 (two) times a week. 24 patch 3   estradiol (VIVELLE-DOT) 0.1 MG/24HR patch Place 1 patch (0.1 mg total) onto the skin 2 (two) times a week. 8 patch 6   meloxicam (MOBIC) 15 MG tablet Take 1 tablet (15 mg total) by mouth daily. 7 tablet 0   oxyCODONE (ROXICODONE) 5 MG immediate release tablet Take 1 tablet (5 mg total) by mouth every 4 (four) hours as needed for severe pain or breakthrough pain. 30 tablet 0   torsemide (DEMADEX) 10 MG tablet Take 3 tablets (30 mg total) by mouth daily. 270 tablet 3   Ubrogepant (UBRELVY) 100 MG TABS Take 1 tablet (100 mg total) by mouth daily as needed. 30 tablet 1   valACYclovir (VALTREX) 500 MG tablet Take 500 mg by mouth as needed.     acetaminophen (TYLENOL) 500 MG tablet Take 1 tablet (500 mg total) by mouth every 8 (eight) hours. 30 tablet 0   oxyCODONE (ROXICODONE) 5 MG immediate release tablet Take 1 tablet (5 mg total) by mouth every 4 (four) hours as needed for severe pain or breakthrough pain. 20 tablet 0   Potassium Chloride ER 20 MEQ TBCR Take 20 mEq by mouth 2 (two) times daily. 180 tablet 0   traMADol (ULTRAM) 50 MG tablet Take 1 tablet (50 mg total) by mouth every 12 (twelve) hours as needed. 30 tablet 0   No facility-administered medications prior to visit.    ROS Review of Systems  Constitutional:  Negative for appetite change, diaphoresis, fatigue and unexpected weight change.  HENT: Negative.    Eyes: Negative.   Respiratory:  Negative for cough, chest tightness, shortness of breath and wheezing.   Cardiovascular:  Negative for chest pain, palpitations and leg swelling.  Gastrointestinal:  Negative for abdominal pain, diarrhea, nausea and vomiting.  Endocrine: Negative.   Genitourinary: Negative.  Negative for difficulty urinating.  Musculoskeletal:  Positive for arthralgias. Negative for joint swelling and myalgias.  Skin: Negative.  Negative for color change and pallor.  Neurological: Negative.   Negative for dizziness, weakness, light-headedness and headaches.  Hematological:  Negative for adenopathy. Does not bruise/bleed easily.  Psychiatric/Behavioral: Negative.      Objective:  BP 126/78 (BP Location: Left Arm, Patient Position: Sitting, Cuff Size: Large)   Pulse 83   Temp 98 F (36.7 C) (Oral)   Resp 16   Ht 5\' 3"  (1.6 m)   Wt 193 lb (87.5 kg)   LMP 01/12/2012   SpO2 97%   BMI 34.19 kg/m   BP Readings from Last 3 Encounters:  01/23/23 126/78  12/20/22 138/70  10/27/22 122/80    Wt Readings from Last 3 Encounters:  01/23/23 193 lb (87.5 kg)  12/20/22 195 lb 8.8 oz (88.7 kg)  10/27/22 200 lb (90.7 kg)    Physical Exam Vitals reviewed.  Constitutional:      Appearance: Normal appearance.  HENT:     Mouth/Throat:     Mouth: Mucous membranes are moist.  Eyes:     General: No scleral icterus.    Conjunctiva/sclera: Conjunctivae normal.  Cardiovascular:     Rate and Rhythm: Normal rate and regular rhythm.     Heart sounds: No murmur heard. Pulmonary:     Effort: Pulmonary effort is normal.     Breath sounds: No stridor. No wheezing, rhonchi or rales.  Abdominal:     General: Abdomen is flat.     Palpations: There is no mass.     Tenderness: There is no abdominal tenderness. There is no guarding.     Hernia: No hernia is present.  Musculoskeletal:     Cervical back: Neck supple.     Right lower leg: No edema.     Left lower leg: No edema.  Lymphadenopathy:     Cervical: No cervical adenopathy.  Skin:    General: Skin is warm and dry.  Neurological:     General: No focal deficit present.     Mental Status: She is alert. Mental status is at baseline.  Psychiatric:        Mood and Affect: Mood normal.        Behavior: Behavior normal.     Lab Results  Component Value Date   WBC 6.3 10/27/2022   HGB 14.2 10/27/2022   HCT 42.7 10/27/2022   PLT 316.0 10/27/2022   GLUCOSE 134 (H) 12/20/2022   CHOL 220 (H) 10/27/2022   TRIG 156.0 (H)  10/27/2022   HDL 49.00 10/27/2022   LDLCALC 140 (H) 10/27/2022   ALT 16 10/27/2022   AST 17 10/27/2022   NA 140 12/20/2022   K 2.8 (L) 12/20/2022   CL 100 12/20/2022   CREATININE 1.15 (H) 12/20/2022   BUN 10 12/20/2022   CO2 29 12/20/2022   TSH 1.51 10/27/2022   INR 2.3 02/05/2019   HGBA1C 5.7 10/27/2022    DG Knee 1-2 Views Left  Result Date: 12/24/2022 CLINICAL DATA:  Elective surgery. EXAM: LEFT KNEE - 1-2 VIEW COMPARISON:  Preoperative MRI FINDINGS: Two fluoroscopic spot views of the left knee obtained in the operating room in frontal and lateral projections. Three screws traverse the anterior tibial tubercle. Fluoroscopy time 29 seconds. Dose 1.99 mGy. IMPRESSION: Intraoperative fluoroscopy during left knee surgery. Electronically Signed   By: Narda Rutherford M.D.   On: 12/24/2022 17:02   DG C-Arm 1-60 Min-No Report  Result Date: 12/20/2022 Fluoroscopy was utilized by the requesting physician.  No radiographic interpretation.   DG C-Arm 1-60 Min-No Report  Result Date: 12/20/2022 Fluoroscopy was utilized by the requesting physician.  No radiographic interpretation.    Assessment & Plan:   Essential hypertension- Her BP is well controlled. -     Magnesium; Future -     Basic metabolic panel; Future  Diuretic-induced hypokalemia -     Magnesium; Future -     Basic metabolic panel; Future  Hyperglycemia -     Basic metabolic panel; Future -     Hemoglobin A1c; Future  Chronic diastolic CHF (congestive heart failure) (HCC)- She has normal volume status.  Class 1 obesity due to excess calories with serious comorbidity and body mass index (BMI) of 34.0 to 34.9 in adult -     Semaglutide-Weight Management; Inject 0.25 mg into the skin once a week for 28 days.  Dispense: 2 mL; Refill: 0 -     Semaglutide-Weight Management; Inject 0.5 mg into the skin once a week for 28 days.  Dispense: 2 mL; Refill: 0 -     Semaglutide-Weight Management; Inject 1 mg into the skin once a  week for 28 days.  Dispense: 2 mL; Refill: 0 -     Semaglutide-Weight Management; Inject 1.7 mg into the skin once a week for 28 days.  Dispense: 3 mL; Refill: 0 -     Semaglutide-Weight Management; Inject 2.4 mg into the skin once a week for 28 days.  Dispense: 3 mL; Refill: 0     Follow-up: Return in about 4 months (around 05/26/2023).  [name], MD

## 2023-01-23 NOTE — Patient Instructions (Signed)
Hypokalemia Hypokalemia means that the amount of potassium in the blood is lower than normal. Potassium is a mineral (electrolyte) that helps regulate the amount of fluid in the body. It also stimulates muscle tightening (contraction) and helps nerves work properly. Normally, most of the body's potassium is inside cells, and only a very small amount is in the blood. Because the amount in the blood is so small, minor changes to potassium levels in the blood can be life-threatening. What are the causes? This condition may be caused by: Antibiotic medicine. Diarrhea or vomiting. Taking too much of a medicine that helps you have a bowel movement (laxative) can cause diarrhea and lead to hypokalemia. Chronic kidney disease (CKD). Medicines that help the body get rid of excess fluid (diuretics). Eating disorders, such as anorexia or bulimia. Low magnesium levels in the body. Sweating a lot. What are the signs or symptoms? Symptoms of this condition include: Weakness. Constipation. Fatigue. Muscle cramps. Mental confusion. Skipped heartbeats or irregular heartbeat (palpitations). Tingling or numbness. How is this diagnosed? This condition is diagnosed with a blood test. How is this treated? This condition may be treated by: Taking potassium supplements. Adjusting the medicines that you take. Eating more foods that contain a lot of potassium. If your potassium level is very low, you may need to get potassium through an IV and be monitored in the hospital. Follow these instructions at home: Eating and drinking  Eat a healthy diet. A healthy diet includes fresh fruits and vegetables, whole grains, healthy fats, and lean proteins. If told, eat more foods that contain a lot of potassium. These include: Nuts, such as peanuts and pistachios. Seeds, such as sunflower seeds and pumpkin seeds. Peas, lentils, and lima beans. Whole grain and bran cereals and breads. Fresh fruits and vegetables,  such as apricots, avocado, bananas, cantaloupe, kiwi, oranges, tomatoes, asparagus, and potatoes. Juices, such as orange, tomato, and prune. Lean meats, including fish. Milk and milk products, such as yogurt. General instructions Take over-the-counter and prescription medicines only as told by your health care provider. This includes vitamins, natural food products, and supplements. Keep all follow-up visits. This is important. Contact a health care provider if: You have weakness that gets worse. You feel your heart pounding or racing. You vomit. You have diarrhea. You have diabetes and you have trouble keeping your blood sugar in your target range. Get help right away if: You have chest pain. You have shortness of breath. You have vomiting or diarrhea that lasts for more than 2 days. You faint. These symptoms may be an emergency. Get help right away. Call 911. Do not wait to see if the symptoms will go away. Do not drive yourself to the hospital. Summary Hypokalemia means that the amount of potassium in the blood is lower than normal. This condition is diagnosed with a blood test. Hypokalemia may be treated by taking potassium supplements, adjusting the medicines that you take, or eating more foods that are high in potassium. If your potassium level is very low, you may need to get potassium through an IV and be monitored in the hospital. This information is not intended to replace advice given to you by your health care provider. Make sure you discuss any questions you have with your health care provider. Document Revised: 03/04/2021 Document Reviewed: 03/04/2021 Elsevier Patient Education  2024 [employer]  

## 2023-01-24 ENCOUNTER — Other Ambulatory Visit (HOSPITAL_BASED_OUTPATIENT_CLINIC_OR_DEPARTMENT_OTHER): Payer: Self-pay

## 2023-01-24 ENCOUNTER — Encounter (HOSPITAL_BASED_OUTPATIENT_CLINIC_OR_DEPARTMENT_OTHER): Payer: Self-pay

## 2023-01-24 ENCOUNTER — Ambulatory Visit (HOSPITAL_BASED_OUTPATIENT_CLINIC_OR_DEPARTMENT_OTHER): Payer: 59

## 2023-01-24 DIAGNOSIS — R600 Localized edema: Secondary | ICD-10-CM

## 2023-01-24 DIAGNOSIS — M25562 Pain in left knee: Secondary | ICD-10-CM

## 2023-01-24 DIAGNOSIS — M25662 Stiffness of left knee, not elsewhere classified: Secondary | ICD-10-CM

## 2023-01-24 DIAGNOSIS — R2689 Other abnormalities of gait and mobility: Secondary | ICD-10-CM

## 2023-01-24 NOTE — Therapy (Signed)
OUTPATIENT PHYSICAL THERAPY LOWER EXTREMITY Treatment   Patient Name: [name] MRN: [id number] DOB:07/24/1975, 47 y.o., female Today's Date: 01/24/2023  END OF SESSION:  PT End of Session - 01/24/23 1354     Visit Number 6    Number of Visits 16    Date for PT Re-Evaluation 02/17/23    PT Start Time 1350    PT Stop Time 1425    PT Time Calculation (min) 35 min    Activity Tolerance Patient tolerated treatment well    Behavior During Therapy [facility] for tasks assessed/performed                  Past Medical History:  Diagnosis Date   Adenomatous colon polyp 12/2012   Allergic rhinitis    Arthritis    Cellulitis    Common migraine    Diastolic dysfunction    Dysrhythmia    External hemorrhoid    Genital herpes    GERD (gastroesophageal reflux disease)    Hx of migraines    Hyperlipidemia    Hypertension    Lynch syndrome    Mitral regurgitation    MVP (mitral valve prolapse)    Plica syndrome of left knee    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   32 mm Sorin Memo 4D ring annuloplasty via right mini thoracotomy approach   Past Surgical History:  Procedure Laterality Date   ABDOMINAL HYSTERECTOMY     CHOLECYSTECTOMY N/A 01/16/2020   Procedure: LAPAROSCOPIC CHOLECYSTECTOMY WITH INTRAOPERATIVE CHOLANGIOGRAM;  Surgeon: Darnell Level, MD;  Location: [facility];  Service: General;  Laterality: N/A;   COLONOSCOPY     KNEE ARTHROSCOPY WITH MACI CARTILAGE HARVEST Left 10/04/2022   Procedure: LEFT KNEE ARTHROSCOPY WITH MACI CARTILAGE HARVEST;  Surgeon: [name], MD;  Location: [facility];  Service: Orthopedics;  Laterality: Left;   KNEE ARTHROSCOPY WITH MEDIAL MENISECTOMY Left 06/07/2013   Procedure: LEFT KNEE ARTHROSCOPY WITH SYNOVECTOMY LIMITED, ARTHROSCOPY KNEE WITH DEBRIDEMENT/SHAVING (CHONDROPLASTY), ARTHROSCOPY KNEE  ChondroplastyWITH MEDIAL AND LATERAL  MENISECTOMY;  Surgeon: Sheral Apley, MD;  Location: [facility];  Service: Orthopedics;  Laterality: Left;   KNEE ARTHROSCOPY WITH MEDIAL PATELLAR FEMORAL LIGAMENT RECONSTRUCTION Left 12/20/2022   Procedure: KNEE ARTHROSCOPY WITH MEDIAL PATELLAR FEMORAL LIGAMENT RECONSTRUCTION;  Surgeon: [name], MD;  Location: [facility];  Service: Orthopedics;  Laterality: Left;   left knee arthroscopy     MITRAL VALVE REPAIR Right 11/28/2018   Procedure: MINIMALLY INVASIVE MITRAL VALVE REPAIR (MVR) - Using Memo 4D Ring Size 32;  Surgeon: Purcell Nails, MD;  Location: [facility];  Service: Open Heart Surgery;  Laterality: Right;   patrial hysterectomy     POLYPECTOMY     SALPINGOOPHORECTOMY  2022   SYNOVECTOMY Left 06/07/2013   Procedure: SYNOVECTOMY;  Surgeon: Sheral Apley, MD;  Location: [facility];  Service: Orthopedics;  Laterality: Left;   TEE WITHOUT CARDIOVERSION  10/11/2011   Procedure: TRANSESOPHAGEAL ECHOCARDIOGRAM (TEE);  Surgeon: Wendall Stade, MD;  Location: [facility];  Service: Cardiovascular;  Laterality: N/A;   TEE WITHOUT CARDIOVERSION N/A 11/28/2018   Procedure: TRANSESOPHAGEAL ECHOCARDIOGRAM (TEE);  Surgeon: Purcell Nails, MD;  Location: [facility] OR;  Service: Open Heart Surgery;  Laterality: N/A;   TUBAL LIGATION     WISDOM TOOTH EXTRACTION     Patient Active Problem List   Diagnosis Date Noted   Diuretic-induced hypokalemia 01/23/2023   Class 1 obesity due to excess calories with serious  comorbidity and body mass index (BMI) of 34.0 to 34.9 in adult 01/23/2023   Patellar instability of left knee 10/04/2022   Primary osteoarthritis of left knee 09/07/2022   Primary hypertriglyceridemia 03/06/2020   Hyperglycemia 03/05/2020   Narrowing of intervertebral disc space 06/18/2019   Cardiomyopathy (HCC) 01/17/2019   Chronic diastolic CHF (congestive heart failure) (HCC) 12/05/2018   Essential hypertension 07/10/2018   Other specified hypothyroidism 09/22/2014   Visit for screening mammogram  09/22/2014   Insomnia, persistent 03/19/2014   Lynch syndrome 11/28/2012   MR (mitral regurgitation) 10/28/2011   MVP (mitral valve prolapse) 10/28/2011   ALLERGIC RHINITIS 11/06/2007   GERD 11/06/2007    PCP: Dr [name]   REFERRING PROVIDER: Dr [name]   REFERRING DIAG:  Diagnosis  M25.362 (ICD-10-CM) - Patellar instability of left knee   1.  Left knee tibial tubercle osteotomy 2.  Left knee matrix associated autologous chondrocyte implantation medial patella facet    THERAPY DIAG:  Acute pain of left knee  Other abnormalities of gait and mobility  Stiffness of left knee, not elsewhere classified  Localized edema  Rationale for Evaluation and Treatment: Rehabilitation  ONSET DATE: 6/18 surgical procedure. Patient having pain since 2008   SUBJECTIVE:   SUBJECTIVE STATEMENT: Pt reports continued swelling and discomfort.  Has calf pain, but only in the mornings when first getting out of bed.   PERTINENT HISTORY: ACL repair, migraines, athritis of the knee, mitral valve prolapse ,  PAIN:  Are you having pain? Yes: NPRS scale: 0/10 Pain location: medial left knee  Pain description: sharp at times  Aggravating factors: being up  Relieving factors: rest/ pain medication   PRECAUTIONS: Kneefollow MACI protocol   WEIGHT BEARING RESTRICTIONS: Yes TDWB   FALLS:  Has patient fallen in last 6 months? No  LIVING ENVIRONMENT: 1 step into the house. OCCUPATION:  Child psychotherapist   PLOF: Independent  PATIENT GOALS:   NEXT MD VISIT: 2 weeks    OBJECTIVE:   PATIENT SURVEYS:  FOTO eval: 8  7/23: 50% EDEMA:  7/5 knee circumferential 42.5 cm  LOWER EXTREMITY ROM:  Passive ROM Left eval Left 6/27 Left  7/12 Left 7/23  Hip flexion      Hip extension      Hip abduction      Hip adduction      Hip internal rotation      Hip external rotation      Knee flexion 36 61 95 128  Knee extension -3 0 0 0  Ankle dorsiflexion      Ankle plantarflexion       Ankle inversion      Ankle eversion       (Blank rows = not tested)  LOWER EXTREMITY MMT:  MMT Right eval Left eval  Hip flexion    Hip extension    Hip abduction    Hip adduction    Hip internal rotation    Hip external rotation    Knee flexion    Knee extension    Ankle dorsiflexion    Ankle plantarflexion    Ankle inversion    Ankle eversion     (Blank rows = not tested) Not tested 2nd to recent surgery     GAIT: 7/5: TDWB with RW and brace locked in ext  TODAY'S TREATMENT:  0-6 weeks:  beginning at 0-  40 ; advance 5-  10 deg daily as  tolerated    Treatment                            7/23  PROM L knee Measured knee flexion at 128deg actively Long sit HSS 30s x3 Standing gastroc and soleus stretch 2x30sec  SLR 2x10 SL hip circles 2x10ea Sidelying abd 2x10 Prone hip extension 2x10 LAQ 2x10   Treatment                            7/16   Assessed calf pain- negative Homans STM posterior knee IASTM to quad (roller) PROM L knee Supine HS stretch with strap Standing gastroc and soleus stretch 20sec  SLR neutral x8 (began to have 7/10 pain level) SL hip circles 2x10ea Gait: cues for L toe off- 378ft  Treatment                            7/12:  Heel slides with strap in supine Supine HS & ITB stretch with strap SLR neutral & turnout with core engagement.  SL hip abd, circles, arcs SL hip add Gait: heel strike with circumduction to reduce adducted stance phase   Treatment                            7/5:  PROM flexion Supine quad set Sidelying clams Prone HS curl Gait without AD Assisted SLR and pt was able to lower independently   6/27:  Dressing change PROM within pain limits (61deg flexion today) Quad sets 2x10 5s hold Supine SLR with manual assist 1x10 Sidelying hip abduction 2x10 Glute sets 5s x10 Review  of HEP Review of precautions/WB status/proper brace adjustment   PATIENT EDUCATION:  Education details: HEP, symptom management; importance of weight bearing  Person educated: Patient Education method: Explanation, Demonstration, Tactile cues, Verbal cues, and Handouts Education comprehension: verbalized understanding, returned demonstration, verbal cues required, tactile cues required, and needs further education  HOME EXERCISE PROGRAM: Access Code: [email or web address] URL: [email or web address]    ASSESSMENT:  CLINICAL IMPRESSION: 5 weeks s/p today. Progressing well with ROM and strengthening. Less pain today with SLR compared to last visits. Significant improvement in L knee flexion compared to last measurement. Reviewed precautions and restrictions with good understanding. Will continue to progress with protocol.    OBJECTIVE IMPAIRMENTS: Abnormal gait, decreased activity tolerance, decreased balance, decreased knowledge of condition, decreased knowledge of use of DME, difficulty walking, decreased ROM, decreased strength, and pain.   ACTIVITY LIMITATIONS: carrying, lifting, bending, sitting, standing, squatting, sleeping, stairs, transfers, bed mobility, bathing, toileting, dressing, reach over head, hygiene/grooming, and locomotion level  PARTICIPATION LIMITATIONS: meal prep, cleaning, laundry, driving, shopping, community activity, occupation, and yard work  PERSONAL FACTORS: 1-2 comorbidities: Mitral valve repair, ACL repair on the right side  are also affecting patient's functional outcome.   REHAB POTENTIAL: Excellent  CLINICAL DECISION MAKING: Stable/uncomplicated  EVALUATION COMPLEXITY: Low   GOALS: Goals reviewed with patient? Yes  SHORT TERM GOALS: Target date: 01/19/2023   Patient will increase passive left knee flexion by 10 degrees a week until full flexion is met Baseline: Goal status: INITIAL  2.  Patient will demonstrate full active left knee  extension Baseline:  Goal status: achieved  3.  Patient will  progress off walker when cleared by MD for weightbearing Baseline:  Goal status:achieved  4.  Patient will be independent with basic HEP Baseline:  Goal status: INITIAL  LONG TERM GOALS: Target date: 02/16/2023    Patient will go up and down 8 steps with reciprocal gait pattern Baseline:  Goal status: INITIAL  2.  Patient will ambulate community distances without increased left knee pain Baseline:  Goal status: INITIAL  3.  Patient will have a complete HEP to promote further left knee strengthening Baseline:  Goal status: INITIAL   PLAN:  PT FREQUENCY: 2x/week  PT DURATION: 12 weeks  PLANNED INTERVENTIONS: Therapeutic exercises, Therapeutic activity, Neuromuscular re-education, Balance training, Gait training, Patient/Family education, Self Care, Joint mobilization, Stair training, DME instructions, Aquatic Therapy, Dry Needling, Cryotherapy, Moist heat, Taping, Ultrasound, and Manual therapy  PLAN FOR NEXT SESSION: follow  cartilagep-p/ restoration protocol    Riki Altes, PTA  01/24/23 2:30 PM

## 2023-01-25 ENCOUNTER — Other Ambulatory Visit (HOSPITAL_BASED_OUTPATIENT_CLINIC_OR_DEPARTMENT_OTHER): Payer: Self-pay

## 2023-01-25 ENCOUNTER — Telehealth: Payer: Self-pay | Admitting: Pharmacy Technician

## 2023-01-25 MED ORDER — ACETAMINOPHEN 500 MG PO TABS
500.0000 mg | ORAL_TABLET | Freq: Three times a day (TID) | ORAL | 0 refills | Status: DC
  Filled 2023-01-25: qty 30, 10d supply, fill #0

## 2023-01-25 NOTE — Telephone Encounter (Signed)
Pharmacy Patient Advocate Encounter  Received notification from [facility] that Prior Authorization for [facility] 0.25mg  has been  Approved through 08/23/23  PA #/Case ID/Reference #: (Key: [id number]) Rx #: [id number]

## 2023-01-26 ENCOUNTER — Encounter (HOSPITAL_BASED_OUTPATIENT_CLINIC_OR_DEPARTMENT_OTHER): Payer: Self-pay

## 2023-01-26 ENCOUNTER — Ambulatory Visit (HOSPITAL_BASED_OUTPATIENT_CLINIC_OR_DEPARTMENT_OTHER): Payer: 59 | Admitting: Physical Therapy

## 2023-02-01 ENCOUNTER — Other Ambulatory Visit (HOSPITAL_BASED_OUTPATIENT_CLINIC_OR_DEPARTMENT_OTHER): Payer: Self-pay

## 2023-02-01 ENCOUNTER — Other Ambulatory Visit: Payer: Self-pay | Admitting: Orthopaedic Surgery

## 2023-02-01 ENCOUNTER — Other Ambulatory Visit (HOSPITAL_BASED_OUTPATIENT_CLINIC_OR_DEPARTMENT_OTHER): Payer: Self-pay | Admitting: Orthopaedic Surgery

## 2023-02-01 ENCOUNTER — Ambulatory Visit (INDEPENDENT_AMBULATORY_CARE_PROVIDER_SITE_OTHER): Payer: 59 | Admitting: Orthopaedic Surgery

## 2023-02-01 DIAGNOSIS — M25362 Other instability, left knee: Secondary | ICD-10-CM

## 2023-02-01 MED ORDER — ACETAMINOPHEN 500 MG PO TABS
500.0000 mg | ORAL_TABLET | Freq: Three times a day (TID) | ORAL | 0 refills | Status: DC
  Filled 2023-02-01: qty 30, 10d supply, fill #0

## 2023-02-01 NOTE — Progress Notes (Signed)
Post Operative Evaluation    Procedure/Date of Surgery: Left knee MACI and tibial tubercle osteotomy 6/18  Interval History:    Presents today 6 weeks status post the above procedure.  Overall she is doing extremely well.  She is now walking without any type of brace or assistive devices.  She does have knee pain and soreness predominantly with physical therapy although is continuing to improve.  PMH/PSH/Family History/Social History/Meds/Allergies:    Past Medical History:  Diagnosis Date   Adenomatous colon polyp 12/2012   Allergic rhinitis    Arthritis    Cellulitis    Common migraine    Diastolic dysfunction    Dysrhythmia    External hemorrhoid    Genital herpes    GERD (gastroesophageal reflux disease)    Hx of migraines    Hyperlipidemia    Hypertension    Lynch syndrome    Mitral regurgitation    MVP (mitral valve prolapse)    Plica syndrome of left knee    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   32 mm Sorin Memo 4D ring annuloplasty via right mini thoracotomy approach   Past Surgical History:  Procedure Laterality Date   ABDOMINAL HYSTERECTOMY     CHOLECYSTECTOMY N/A 01/16/2020   Procedure: LAPAROSCOPIC CHOLECYSTECTOMY WITH INTRAOPERATIVE CHOLANGIOGRAM;  Surgeon: Darnell Level, MD;  Location: [facility];  Service: General;  Laterality: N/A;   COLONOSCOPY     KNEE ARTHROSCOPY WITH MACI CARTILAGE HARVEST Left 10/04/2022   Procedure: LEFT KNEE ARTHROSCOPY WITH MACI CARTILAGE HARVEST;  Surgeon: [name], MD;  Location: [facility];  Service: Orthopedics;  Laterality: Left;   KNEE ARTHROSCOPY WITH MEDIAL MENISECTOMY Left 06/07/2013   Procedure: LEFT KNEE ARTHROSCOPY WITH SYNOVECTOMY LIMITED, ARTHROSCOPY KNEE WITH DEBRIDEMENT/SHAVING (CHONDROPLASTY), ARTHROSCOPY KNEE  ChondroplastyWITH MEDIAL AND LATERAL  MENISECTOMY;  Surgeon: Sheral Apley, MD;  Location: [facility];  Service:  Orthopedics;  Laterality: Left;   KNEE ARTHROSCOPY WITH MEDIAL PATELLAR FEMORAL LIGAMENT RECONSTRUCTION Left 12/20/2022   Procedure: KNEE ARTHROSCOPY WITH MEDIAL PATELLAR FEMORAL LIGAMENT RECONSTRUCTION;  Surgeon: [name], MD;  Location: [facility];  Service: Orthopedics;  Laterality: Left;   left knee arthroscopy     MITRAL VALVE REPAIR Right 11/28/2018   Procedure: MINIMALLY INVASIVE MITRAL VALVE REPAIR (MVR) - Using Memo 4D Ring Size 32;  Surgeon: Purcell Nails, MD;  Location: [facility];  Service: Open Heart Surgery;  Laterality: Right;   patrial hysterectomy     POLYPECTOMY     SALPINGOOPHORECTOMY  2022   SYNOVECTOMY Left 06/07/2013   Procedure: SYNOVECTOMY;  Surgeon: Sheral Apley, MD;  Location: [facility];  Service: Orthopedics;  Laterality: Left;   TEE WITHOUT CARDIOVERSION  10/11/2011   Procedure: TRANSESOPHAGEAL ECHOCARDIOGRAM (TEE);  Surgeon: Wendall Stade, MD;  Location[facility];  Service: Cardiovascular;  Laterality: N/A;   TEE WITHOUT CARDIOVERSION N/A 11/28/2018   Procedure: TRANSESOPHAGEAL ECHOCARDIOGRAM (TEE);  Surgeon: Purcell Nails, MD;  Location: [facility] OR;  Service: Open Heart Surgery;  Laterality: N/A;   TUBAL LIGATION     WISDOM TOOTH EXTRACTION     Social History   Socioeconomic History   Marital status: Married    Spouse name: Barbara Cower   Number of children: 2   Years of education: Not on file  Highest education level: Master's degree (e.g., MA, MS, MEng, MEd, MSW, MBA)  Occupational History   Occupation: patient account rep    Employer: [employer]  Tobacco Use   Smoking status: Former    Current packs/day: 0.00    Average packs/day: 0.5 packs/day for 12.0 years (6.0 ttl pk-yrs)    Types: Cigars, Cigarettes    Start date: 11/29/1998    Quit date: 11/29/2010    Years since quitting: 12.1   Smokeless tobacco: Never  Vaping Use   Vaping status: Never Used  Substance and Sexual Activity   Alcohol use: Yes     Alcohol/week: 2.0 standard drinks of alcohol    Types: 2 Glasses of wine per week    Comment: social   Drug use: No   Sexual activity: Yes    Birth control/protection: Surgical  Other Topics Concern   Not on file  Social History Narrative   Not on file   Social Determinants of Health   Financial Resource Strain: Low Risk  (01/23/2023)   Overall Financial Resource Strain (CARDIA)    Difficulty of Paying Living Expenses: Not very hard  Food Insecurity: No Food Insecurity (01/23/2023)   Hunger Vital Sign    Worried About Running Out of Food in the Last Year: Never true    Ran Out of Food in the Last Year: Never true  Transportation Needs: No Transportation Needs (01/23/2023)   PRAPARE - Administrator, Civil Service (Medical): No    Lack of Transportation (Non-Medical): No  Physical Activity: Unknown (01/23/2023)   Exercise Vital Sign    Days of Exercise per Week: 0 days    Minutes of Exercise per Session: Not on file  Stress: No Stress Concern Present (01/23/2023)   [employer] of Occupational Health - Occupational Stress Questionnaire    Feeling of Stress : Not at all  Social Connections: Socially Integrated (01/23/2023)   Social Connection and Isolation Panel [NHANES]    Frequency of Communication with Friends and Family: More than three times a week    Frequency of Social Gatherings with Friends and Family: More than three times a week    Attends Religious Services: More than 4 times per year    Active Member of Clubs or Organizations: Yes    Attends Engineer, structural: More than 4 times per year    Marital Status: Married   Family History  Problem Relation Age of Onset   Other Mother        lynch syndrome   Colon cancer Father 15   Colon polyps Maternal Uncle    Prostate cancer Maternal Uncle 64   Colon cancer Paternal Aunt 30   Colon cancer Paternal Uncle 20   Colon cancer Paternal Uncle 53   Multiple myeloma Maternal Grandmother 97    Parkinsonism Paternal Grandfather    Other Daughter        Lynch syndrome   Breast cancer Cousin        lynch syndrome    Breast cancer Other        Paternal Great Grandmother   Esophageal cancer Neg Hx    Rectal cancer Neg Hx    Allergies  Allergen Reactions   Bee Venom    Current Outpatient Medications  Medication Sig Dispense Refill   acetaminophen (PAIN RELIEF EXTRA STRENGTH) 500 MG tablet Take 1 tablet (500 mg total) by mouth every 8 (eight) hours. 30 tablet 0   aspirin EC 325 MG tablet  Take 1 tablet (325 mg total) by mouth daily. 30 tablet 0   estradiol (VIVELLE-DOT) 0.1 MG/24HR patch Place 1 patch onto the skin 2 (two) times a week.     estradiol (VIVELLE-DOT) 0.1 MG/24HR patch Place 1 patch (0.1 mg total) onto the skin 2 (two) times a week. 24 patch 3   estradiol (VIVELLE-DOT) 0.1 MG/24HR patch Place 1 patch (0.1 mg total) onto the skin 2 (two) times a week. 8 patch 6   meloxicam (MOBIC) 15 MG tablet Take 1 tablet (15 mg total) by mouth daily. 7 tablet 0   oxyCODONE (ROXICODONE) 5 MG immediate release tablet Take 1 tablet (5 mg total) by mouth every 4 (four) hours as needed for severe pain or breakthrough pain. 30 tablet 0   oxyCODONE (ROXICODONE) 5 MG immediate release tablet Take 1 tablet (5 mg total) by mouth every 4 (four) hours as needed for severe pain or breakthrough pain. 20 tablet 0   Potassium Chloride ER 20 MEQ TBCR Take 20 mEq by mouth 2 (two) times daily. 180 tablet 0   Semaglutide-Weight Management 0.25 MG/0.5ML SOAJ Inject 0.25 mg into the skin once a week for 28 days. 2 mL 0   [START ON 02/21/2023] Semaglutide-Weight Management 0.5 MG/0.5ML SOAJ Inject 0.5 mg into the skin once a week for 28 days. 2 mL 0   [START ON 03/22/2023] Semaglutide-Weight Management 1 MG/0.5ML SOAJ Inject 1 mg into the skin once a week for 28 days. 2 mL 0   [START ON 04/20/2023] Semaglutide-Weight Management 1.7 MG/0.75ML SOAJ Inject 1.7 mg into the skin once a week for 28 days. 3 mL 0    [START ON 05/19/2023] Semaglutide-Weight Management 2.4 MG/0.75ML SOAJ Inject 2.4 mg into the skin once a week for 28 days. 3 mL 0   torsemide (DEMADEX) 10 MG tablet Take 3 tablets (30 mg total) by mouth daily. 270 tablet 3   Ubrogepant (UBRELVY) 100 MG TABS Take 1 tablet (100 mg total) by mouth daily as needed. 30 tablet 1   valACYclovir (VALTREX) 500 MG tablet Take 500 mg by mouth as needed.     No current facility-administered medications for this visit.   No results found.  Review of Systems:   A ROS was performed including pertinent positives and negatives as documented in the HPI.   Musculoskeletal Exam:    Left leg is well-appearing.  Incisions are well-healed.  She is able to actively extend at the left knee with good quadricep strength able to fire tibialis anterior as well as gastrocsoleus.  2+ dorsalis pedis pulse.  Swelling is minimal.  Range of motion about the left knee is deferred.   Imaging:     I personally reviewed and interpreted the radiographs.   Assessment:   6 weeks status post left tibial tubercle osteotomy as well as patella MACI overall doing extremely well.  At this time she will continue along the tibial tubercle protocol.  I will plan to see her back in 6 weeks for reassessment Plan :    -Return to clinic 6 weeks for reassessment      I personally saw and evaluated the patient, and participated in the management and treatment plan.  [name], MD Attending Physician, Orthopedic Surgery  This document was dictated using Dragon voice recognition software. A reasonable attempt at proof reading has been made to minimize errors.

## 2023-02-02 ENCOUNTER — Encounter (HOSPITAL_BASED_OUTPATIENT_CLINIC_OR_DEPARTMENT_OTHER): Payer: Self-pay

## 2023-02-02 ENCOUNTER — Ambulatory Visit (HOSPITAL_BASED_OUTPATIENT_CLINIC_OR_DEPARTMENT_OTHER): Payer: 59 | Attending: Orthopaedic Surgery | Admitting: Physical Therapy

## 2023-02-02 ENCOUNTER — Other Ambulatory Visit (HOSPITAL_BASED_OUTPATIENT_CLINIC_OR_DEPARTMENT_OTHER): Payer: Self-pay

## 2023-02-02 DIAGNOSIS — M25662 Stiffness of left knee, not elsewhere classified: Secondary | ICD-10-CM | POA: Insufficient documentation

## 2023-02-02 DIAGNOSIS — M25562 Pain in left knee: Secondary | ICD-10-CM | POA: Insufficient documentation

## 2023-02-02 DIAGNOSIS — R2689 Other abnormalities of gait and mobility: Secondary | ICD-10-CM | POA: Insufficient documentation

## 2023-02-03 ENCOUNTER — Other Ambulatory Visit (HOSPITAL_BASED_OUTPATIENT_CLINIC_OR_DEPARTMENT_OTHER): Payer: Self-pay

## 2023-02-03 ENCOUNTER — Encounter (HOSPITAL_BASED_OUTPATIENT_CLINIC_OR_DEPARTMENT_OTHER): Payer: Self-pay

## 2023-02-03 ENCOUNTER — Other Ambulatory Visit (HOSPITAL_BASED_OUTPATIENT_CLINIC_OR_DEPARTMENT_OTHER): Payer: Self-pay | Admitting: Student

## 2023-02-05 ENCOUNTER — Other Ambulatory Visit (HOSPITAL_BASED_OUTPATIENT_CLINIC_OR_DEPARTMENT_OTHER): Payer: Self-pay

## 2023-02-06 ENCOUNTER — Ambulatory Visit (HOSPITAL_BASED_OUTPATIENT_CLINIC_OR_DEPARTMENT_OTHER): Payer: 59

## 2023-02-06 NOTE — Therapy (Signed)
OUTPATIENT PHYSICAL THERAPY LOWER EXTREMITY Treatment   Patient Name: [name] MRN: [id number] DOB:[birth year], 47 y.o., female Today's Date: 02/07/2023  END OF SESSION:  PT End of Session - 02/07/23 1018     Visit Number 7    Number of Visits 16    Date for PT Re-Evaluation 02/17/23    PT Start Time 1015    PT Stop Time 1055    PT Time Calculation (min) 40 min    Activity Tolerance Patient tolerated treatment well    Behavior During Therapy [facility] for tasks assessed/performed                   Past Medical History:  Diagnosis Date   Adenomatous colon polyp 12/2012   Allergic rhinitis    Arthritis    Cellulitis    Common migraine    Diastolic dysfunction    Dysrhythmia    External hemorrhoid    Genital herpes    GERD (gastroesophageal reflux disease)    Hx of migraines    Hyperlipidemia    Hypertension    Lynch syndrome    Mitral regurgitation    MVP (mitral valve prolapse)    Plica syndrome of left knee    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   32 mm Sorin Memo 4D ring annuloplasty via right mini thoracotomy approach   Past Surgical History:  Procedure Laterality Date   ABDOMINAL HYSTERECTOMY     CHOLECYSTECTOMY N/A 01/16/2020   Procedure: LAPAROSCOPIC CHOLECYSTECTOMY WITH INTRAOPERATIVE CHOLANGIOGRAM;  Surgeon: Darnell Level, MD;  Location: [facility];  Service: General;  Laterality: N/A;   COLONOSCOPY     KNEE ARTHROSCOPY WITH MACI CARTILAGE HARVEST Left 10/04/2022   Procedure: LEFT KNEE ARTHROSCOPY WITH MACI CARTILAGE HARVEST;  Surgeon: [name], MD;  Location: [facility];  Service: Orthopedics;  Laterality: Left;   KNEE ARTHROSCOPY WITH MEDIAL MENISECTOMY Left 06/07/2013   Procedure: LEFT KNEE ARTHROSCOPY WITH SYNOVECTOMY LIMITED, ARTHROSCOPY KNEE WITH DEBRIDEMENT/SHAVING (CHONDROPLASTY), ARTHROSCOPY KNEE  ChondroplastyWITH MEDIAL AND LATERAL  MENISECTOMY;  Surgeon: Sheral Apley, MD;  Location: [facility];  Service: Orthopedics;  Laterality: Left;   KNEE ARTHROSCOPY WITH MEDIAL PATELLAR FEMORAL LIGAMENT RECONSTRUCTION Left 12/20/2022   Procedure: KNEE ARTHROSCOPY WITH MEDIAL PATELLAR FEMORAL LIGAMENT RECONSTRUCTION;  Surgeon: [name], MD;  Location: [facility];  Service: Orthopedics;  Laterality: Left;   left knee arthroscopy     MITRAL VALVE REPAIR Right 11/28/2018   Procedure: MINIMALLY INVASIVE MITRAL VALVE REPAIR (MVR) - Using Memo 4D Ring Size 32;  Surgeon: Purcell Nails, MD;  Location: [facility] OR;  Service: Open Heart Surgery;  Laterality: Right;   patrial hysterectomy     POLYPECTOMY     SALPINGOOPHORECTOMY  2022   SYNOVECTOMY Left 06/07/2013   Procedure: SYNOVECTOMY;  Surgeon: Sheral Apley, MD;  Location:  SURGERY CENTER;  Service: Orthopedics;  Laterality: Left;   TEE WITHOUT CARDIOVERSION  10/11/2011   Procedure: TRANSESOPHAGEAL ECHOCARDIOGRAM (TEE);  Surgeon: Wendall Stade, MD;  Location: [facility];  Service: Cardiovascular;  Laterality: N/A;   TEE WITHOUT CARDIOVERSION N/A 11/28/2018   Procedure: TRANSESOPHAGEAL ECHOCARDIOGRAM (TEE);  Surgeon: Purcell Nails, MD;  Location: [facility] OR;  Service: Open Heart Surgery;  Laterality: N/A;   TUBAL LIGATION     WISDOM TOOTH EXTRACTION     Patient Active Problem List   Diagnosis Date Noted   Diuretic-induced hypokalemia 01/23/2023   Class 1 obesity due to excess calories with  serious comorbidity and body mass index (BMI) of 34.0 to 34.9 in adult 01/23/2023   Patellar instability of left knee 10/04/2022   Primary osteoarthritis of left knee 09/07/2022   Primary hypertriglyceridemia 03/06/2020   Hyperglycemia 03/05/2020   Narrowing of intervertebral disc space 06/18/2019   Cardiomyopathy (HCC) 01/17/2019   Chronic diastolic CHF (congestive heart failure) (HCC) 12/05/2018   Essential hypertension 07/10/2018   Other specified hypothyroidism 09/22/2014   Visit for screening mammogram  09/22/2014   Insomnia, persistent 03/19/2014   Lynch syndrome 11/28/2012   MR (mitral regurgitation) 10/28/2011   MVP (mitral valve prolapse) 10/28/2011   ALLERGIC RHINITIS 11/06/2007   GERD 11/06/2007    PCP: Dr [name]   REFERRING PROVIDER: Dr [name]   REFERRING DIAG:  Diagnosis  M25.362 (ICD-10-CM) - Patellar instability of left knee   1.  Left knee tibial tubercle osteotomy 2.  Left knee matrix associated autologous chondrocyte implantation medial patella facet    THERAPY DIAG:  Acute pain of left knee  Other abnormalities of gait and mobility  Stiffness of left knee, not elsewhere classified  Rationale for Evaluation and Treatment: Rehabilitation  ONSET DATE: 6/18 surgical procedure. Patient having pain since 2008   SUBJECTIVE:   SUBJECTIVE STATEMENT: Medial pain at rest. Lateral pain in stance phase.   PERTINENT HISTORY: ACL repair, migraines, athritis of the knee, mitral valve prolapse ,  PAIN:  Are you having pain? Yes: NPRS scale: 4/10 Pain location: medial left knee  Pain description: sharp at times  Aggravating factors: being up  Relieving factors: rest/ pain medication   PRECAUTIONS: Kneefollow MACI protocol   WEIGHT BEARING RESTRICTIONS: Yes TDWB   FALLS:  Has patient fallen in last 6 months? No  LIVING ENVIRONMENT: 1 step into the house. OCCUPATION:  Child psychotherapist   PLOF: Independent  PATIENT GOALS:  Decr pain, back to normal   OBJECTIVE:   PATIENT SURVEYS:  FOTO eval: 8  7/23: 50% EDEMA:  7/5 knee circumferential 42.5 cm  LOWER EXTREMITY ROM:  Passive ROM Left eval Left 6/27 Left  7/12 Left 7/23  Hip flexion      Hip extension      Hip abduction      Hip adduction      Hip internal rotation      Hip external rotation      Knee flexion 36 61 95 128  Knee extension -3 0 0 0  Ankle dorsiflexion      Ankle plantarflexion      Ankle inversion      Ankle eversion       (Blank rows = not tested)  LOWER  EXTREMITY MMT:  MMT Right eval Left eval  Hip flexion    Hip extension    Hip abduction    Hip adduction    Hip internal rotation    Hip external rotation    Knee flexion    Knee extension    Ankle dorsiflexion    Ankle plantarflexion    Ankle inversion    Ankle eversion     (Blank rows = not tested) Not tested 2nd to recent surgery     GAIT: 7/5: TDWB with RW and brace locked in ext  TODAY'S TREATMENT:  Treatment                            8/6:  Medal to lateral patellar mobs Ktape for patellar tracking & MCL support Sidelying hip: abd, circles, arcs SLS with hip hike/hip abd activation Gait training: central heel strike, glut med activation Prone HS curls 4lb   Treatment                            7/23  PROM L knee Measured knee flexion at 128deg actively Long sit HSS 30s x3 Standing gastroc and soleus stretch 2x30sec  SLR 2x10 SL hip circles 2x10ea Sidelying abd 2x10 Prone hip extension 2x10 LAQ 2x10   Treatment                            7/16   Assessed calf pain- negative Homans STM posterior knee IASTM to quad (roller) PROM L knee Supine HS stretch with strap Standing gastroc and soleus stretch 20sec  SLR neutral x8 (began to have 7/10 pain level) SL hip circles 2x10ea Gait: cues for L toe off- 325ft     PATIENT EDUCATION:  Education details: HEP, symptom management; importance of weight bearing  Person educated: Patient Education method: Explanation, Demonstration, Tactile cues, Verbal cues, and Handouts Education comprehension: verbalized understanding, returned demonstration, verbal cues required, tactile cues required, and needs further education  HOME EXERCISE PROGRAM: Access Code: [email or web address] URL: [email or web address]    ASSESSMENT:  CLINICAL IMPRESSION: Significant weakness in Rt hip abd  (measured 4-/5 today) resulting in genu valgus posture in WB with associated pain complaints. Significant laxity in lateral ankle also contributing to CKC instability.    OBJECTIVE IMPAIRMENTS: Abnormal gait, decreased activity tolerance, decreased balance, decreased knowledge of condition, decreased knowledge of use of DME, difficulty walking, decreased ROM, decreased strength, and pain.   ACTIVITY LIMITATIONS: carrying, lifting, bending, sitting, standing, squatting, sleeping, stairs, transfers, bed mobility, bathing, toileting, dressing, reach over head, hygiene/grooming, and locomotion level  PARTICIPATION LIMITATIONS: meal prep, cleaning, laundry, driving, shopping, community activity, occupation, and yard work  PERSONAL FACTORS: 1-2 comorbidities: Mitral valve repair, ACL repair on the right side  are also affecting patient's functional outcome.   REHAB POTENTIAL: Excellent  CLINICAL DECISION MAKING: Stable/uncomplicated  EVALUATION COMPLEXITY: Low   GOALS: Goals reviewed with patient? Yes  SHORT TERM GOALS: Target date: 01/19/2023   Patient will increase passive left knee flexion by 10 degrees a week until full flexion is met Baseline: Goal status:achieved  2.  Patient will demonstrate full active left knee extension Baseline:  Goal status: achieved  3.  Patient will progress off walker when cleared by MD for weightbearing Baseline:  Goal status:achieved  4.  Patient will be independent with basic HEP Baseline:  Goal status: achieved  LONG TERM GOALS: Target date: 02/16/2023    Patient will go up and down 8 steps with reciprocal gait pattern Baseline:  Goal status: INITIAL  2.  Patient will ambulate community distances without increased left knee pain Baseline:  Goal status: INITIAL  3.  Patient will have a complete HEP to promote further left knee strengthening Baseline:  Goal status: INITIAL   PLAN:  PT FREQUENCY: 2x/week  PT DURATION: 12  weeks  PLANNED INTERVENTIONS: Therapeutic exercises, Therapeutic activity, Neuromuscular re-education, Balance training, Gait training, Patient/Family education, Self Care, Joint mobilization, Stair training, DME instructions, Aquatic Therapy,  Dry Needling, Cryotherapy, Moist heat, Taping, Ultrasound, and Manual therapy  PLAN FOR NEXT SESSION: ankle stability, review hip abd activation in CKC.     C.  PT, DPT 02/07/23 10:55 AM

## 2023-02-07 ENCOUNTER — Ambulatory Visit (HOSPITAL_BASED_OUTPATIENT_CLINIC_OR_DEPARTMENT_OTHER): Payer: 59 | Admitting: Physical Therapy

## 2023-02-07 ENCOUNTER — Encounter (HOSPITAL_BASED_OUTPATIENT_CLINIC_OR_DEPARTMENT_OTHER): Payer: Self-pay | Admitting: Physical Therapy

## 2023-02-07 DIAGNOSIS — R2689 Other abnormalities of gait and mobility: Secondary | ICD-10-CM

## 2023-02-07 DIAGNOSIS — M25562 Pain in left knee: Secondary | ICD-10-CM

## 2023-02-07 DIAGNOSIS — M25662 Stiffness of left knee, not elsewhere classified: Secondary | ICD-10-CM

## 2023-02-08 ENCOUNTER — Encounter (HOSPITAL_BASED_OUTPATIENT_CLINIC_OR_DEPARTMENT_OTHER): Payer: Self-pay | Admitting: Orthopaedic Surgery

## 2023-02-08 ENCOUNTER — Ambulatory Visit: Payer: 59 | Admitting: Internal Medicine

## 2023-02-09 ENCOUNTER — Encounter (HOSPITAL_BASED_OUTPATIENT_CLINIC_OR_DEPARTMENT_OTHER): Payer: 59

## 2023-02-09 ENCOUNTER — Other Ambulatory Visit (HOSPITAL_BASED_OUTPATIENT_CLINIC_OR_DEPARTMENT_OTHER): Payer: Self-pay

## 2023-02-09 ENCOUNTER — Other Ambulatory Visit (HOSPITAL_BASED_OUTPATIENT_CLINIC_OR_DEPARTMENT_OTHER): Payer: Self-pay | Admitting: Orthopaedic Surgery

## 2023-02-09 MED ORDER — TRAMADOL HCL 50 MG PO TABS
50.0000 mg | ORAL_TABLET | Freq: Four times a day (QID) | ORAL | 1 refills | Status: DC | PRN
  Filled 2023-02-09: qty 30, 8d supply, fill #0
  Filled 2023-03-21: qty 30, 8d supply, fill #1

## 2023-02-13 ENCOUNTER — Encounter (HOSPITAL_BASED_OUTPATIENT_CLINIC_OR_DEPARTMENT_OTHER): Payer: Self-pay

## 2023-02-13 ENCOUNTER — Ambulatory Visit (HOSPITAL_BASED_OUTPATIENT_CLINIC_OR_DEPARTMENT_OTHER): Payer: 59

## 2023-02-14 ENCOUNTER — Encounter (HOSPITAL_BASED_OUTPATIENT_CLINIC_OR_DEPARTMENT_OTHER): Payer: Self-pay | Admitting: Orthopaedic Surgery

## 2023-02-14 ENCOUNTER — Other Ambulatory Visit (HOSPITAL_BASED_OUTPATIENT_CLINIC_OR_DEPARTMENT_OTHER): Payer: Self-pay

## 2023-02-16 ENCOUNTER — Encounter (HOSPITAL_BASED_OUTPATIENT_CLINIC_OR_DEPARTMENT_OTHER): Payer: Self-pay

## 2023-02-16 ENCOUNTER — Ambulatory Visit (HOSPITAL_BASED_OUTPATIENT_CLINIC_OR_DEPARTMENT_OTHER): Payer: 59

## 2023-02-18 ENCOUNTER — Other Ambulatory Visit (HOSPITAL_BASED_OUTPATIENT_CLINIC_OR_DEPARTMENT_OTHER): Payer: Self-pay

## 2023-03-03 ENCOUNTER — Other Ambulatory Visit (HOSPITAL_BASED_OUTPATIENT_CLINIC_OR_DEPARTMENT_OTHER): Payer: Self-pay

## 2023-03-11 ENCOUNTER — Other Ambulatory Visit (HOSPITAL_BASED_OUTPATIENT_CLINIC_OR_DEPARTMENT_OTHER): Payer: Self-pay

## 2023-03-11 ENCOUNTER — Other Ambulatory Visit (HOSPITAL_BASED_OUTPATIENT_CLINIC_OR_DEPARTMENT_OTHER): Payer: Self-pay | Admitting: Orthopaedic Surgery

## 2023-03-13 ENCOUNTER — Other Ambulatory Visit (HOSPITAL_BASED_OUTPATIENT_CLINIC_OR_DEPARTMENT_OTHER): Payer: Self-pay

## 2023-03-13 ENCOUNTER — Other Ambulatory Visit: Payer: Self-pay

## 2023-03-14 ENCOUNTER — Other Ambulatory Visit (HOSPITAL_BASED_OUTPATIENT_CLINIC_OR_DEPARTMENT_OTHER): Payer: Self-pay

## 2023-03-15 ENCOUNTER — Ambulatory Visit (INDEPENDENT_AMBULATORY_CARE_PROVIDER_SITE_OTHER): Payer: 59 | Admitting: Orthopaedic Surgery

## 2023-03-15 DIAGNOSIS — M25362 Other instability, left knee: Secondary | ICD-10-CM

## 2023-03-15 NOTE — Progress Notes (Signed)
Post Operative Evaluation    Procedure/Date of Surgery: Left knee MACI and tibial tubercle osteotomy 6/18  Interval History:    Presents today status post the above procedure.  She is overall doing extremely well.  She has had a brief hiatus from physical therapy and does note that there is some weakness in the quadriceps compared to the contralateral side although if anything the left side is feeling slightly better than the right.  She is somewhat concerned about overusing the right side as well.  PMH/PSH/Family History/Social History/Meds/Allergies:    Past Medical History:  Diagnosis Date   Adenomatous colon polyp 12/2012   Allergic rhinitis    Arthritis    Cellulitis    Common migraine    Diastolic dysfunction    Dysrhythmia    External hemorrhoid    Genital herpes    GERD (gastroesophageal reflux disease)    Hx of migraines    Hyperlipidemia    Hypertension    Lynch syndrome    Mitral regurgitation    MVP (mitral valve prolapse)    Plica syndrome of left knee    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   32 mm Sorin Memo 4D ring annuloplasty via right mini thoracotomy approach   Past Surgical History:  Procedure Laterality Date   ABDOMINAL HYSTERECTOMY     CHOLECYSTECTOMY N/A 01/16/2020   Procedure: LAPAROSCOPIC CHOLECYSTECTOMY WITH INTRAOPERATIVE CHOLANGIOGRAM;  Surgeon: Darnell Level, MD;  Location: [facility];  Service: General;  Laterality: N/A;   COLONOSCOPY     KNEE ARTHROSCOPY WITH MACI CARTILAGE HARVEST Left 10/04/2022   Procedure: LEFT KNEE ARTHROSCOPY WITH MACI CARTILAGE HARVEST;  Surgeon: [name], MD;  Location: [facility];  Service: Orthopedics;  Laterality: Left;   KNEE ARTHROSCOPY WITH MEDIAL MENISECTOMY Left 06/07/2013   Procedure: LEFT KNEE ARTHROSCOPY WITH SYNOVECTOMY LIMITED, ARTHROSCOPY KNEE WITH DEBRIDEMENT/SHAVING (CHONDROPLASTY), ARTHROSCOPY KNEE  ChondroplastyWITH MEDIAL AND  LATERAL  MENISECTOMY;  Surgeon: Sheral Apley, MD;  Location: [facility];  Service: Orthopedics;  Laterality: Left;   KNEE ARTHROSCOPY WITH MEDIAL PATELLAR FEMORAL LIGAMENT RECONSTRUCTION Left 12/20/2022   Procedure: KNEE ARTHROSCOPY WITH MEDIAL PATELLAR FEMORAL LIGAMENT RECONSTRUCTION;  Surgeon: [name], MD;  Location: [facility];  Service: Orthopedics;  Laterality: Left;   left knee arthroscopy     MITRAL VALVE REPAIR Right 11/28/2018   Procedure: MINIMALLY INVASIVE MITRAL VALVE REPAIR (MVR) - Using Memo 4D Ring Size 32;  Surgeon: Purcell Nails, MD;  Location: [facility];  Service: Open Heart Surgery;  Laterality: Right;   patrial hysterectomy     POLYPECTOMY     SALPINGOOPHORECTOMY  2022   SYNOVECTOMY Left 06/07/2013   Procedure: SYNOVECTOMY;  Surgeon: Sheral Apley, MD;  Location: [facility];  Service: Orthopedics;  Laterality: Left;   TEE WITHOUT CARDIOVERSION  10/11/2011   Procedure: TRANSESOPHAGEAL ECHOCARDIOGRAM (TEE);  Surgeon: Wendall Stade, MD;  Location: [facility]COPY;  Service: Cardiovascular;  Laterality: N/A;   TEE WITHOUT CARDIOVERSION N/A 11/28/2018   Procedure: TRANSESOPHAGEAL ECHOCARDIOGRAM (TEE);  Surgeon: Purcell Nails, MD;  Location: [facility] OR;  Service: Open Heart Surgery;  Laterality: N/A;   TUBAL LIGATION     WISDOM TOOTH EXTRACTION     Social History   Socioeconomic History   Marital status: Married  Spouse name: Barbara Cower   Number of children: 2   Years of education: Not on file   Highest education level: Master's degree (e.g., MA, MS, MEng, MEd, MSW, MBA)  Occupational History   Occupation: patient account rep    Employer: [employer]  Tobacco Use   Smoking status: Former    Current packs/day: 0.00    Average packs/day: 0.5 packs/day for 12.0 years (6.0 ttl pk-yrs)    Types: Cigars, Cigarettes    Start date: 11/29/1998    Quit date: 11/29/2010    Years since quitting: 12.2   Smokeless tobacco:  Never  Vaping Use   Vaping status: Never Used  Substance and Sexual Activity   Alcohol use: Yes    Alcohol/week: 2.0 standard drinks of alcohol    Types: 2 Glasses of wine per week    Comment: social   Drug use: No   Sexual activity: Yes    Birth control/protection: Surgical  Other Topics Concern   Not on file  Social History Narrative   Not on file   Social Determinants of Health   Financial Resource Strain: Low Risk  (01/23/2023)   Overall Financial Resource Strain (CARDIA)    Difficulty of Paying Living Expenses: Not very hard  Food Insecurity: No Food Insecurity (01/23/2023)   Hunger Vital Sign    Worried About Running Out of Food in the Last Year: Never true    Ran Out of Food in the Last Year: Never true  Transportation Needs: No Transportation Needs (01/23/2023)   PRAPARE - Administrator, Civil Service (Medical): No    Lack of Transportation (Non-Medical): No  Physical Activity: Unknown (01/23/2023)   Exercise Vital Sign    Days of Exercise per Week: 0 days    Minutes of Exercise per Session: Not on file  Stress: No Stress Concern Present (01/23/2023)   [employer] of Occupational Health - Occupational Stress Questionnaire    Feeling of Stress : Not at all  Social Connections: Socially Integrated (01/23/2023)   Social Connection and Isolation Panel [NHANES]    Frequency of Communication with Friends and Family: More than three times a week    Frequency of Social Gatherings with Friends and Family: More than three times a week    Attends Religious Services: More than 4 times per year    Active Member of Clubs or Organizations: Yes    Attends Engineer, structural: More than 4 times per year    Marital Status: Married   Family History  Problem Relation Age of Onset   Other Mother        lynch syndrome   Colon cancer Father 9   Colon polyps Maternal Uncle    Prostate cancer Maternal Uncle 64   Colon cancer Paternal Aunt 30   Colon cancer  Paternal Uncle 44   Colon cancer Paternal Uncle 10   Multiple myeloma Maternal Grandmother 37   Parkinsonism Paternal Grandfather    Other Daughter        Lynch syndrome   Breast cancer Cousin        lynch syndrome    Breast cancer Other        Paternal Great Grandmother   Esophageal cancer Neg Hx    Rectal cancer Neg Hx    Allergies  Allergen Reactions   Bee Venom    Current Outpatient Medications  Medication Sig Dispense Refill   acetaminophen (PAIN RELIEF EXTRA STRENGTH) 500 MG tablet Take 1 tablet (  500 mg total) by mouth every 8 (eight) hours. 30 tablet 0   aspirin EC 325 MG tablet Take 1 tablet (325 mg total) by mouth daily. 30 tablet 0   estradiol (VIVELLE-DOT) 0.1 MG/24HR patch Place 1 patch onto the skin 2 (two) times a week.     estradiol (VIVELLE-DOT) 0.1 MG/24HR patch Place 1 patch (0.1 mg total) onto the skin 2 (two) times a week. 24 patch 3   estradiol (VIVELLE-DOT) 0.1 MG/24HR patch Place 1 patch (0.1 mg total) onto the skin 2 (two) times a week. 8 patch 6   meloxicam (MOBIC) 15 MG tablet Take 1 tablet (15 mg total) by mouth daily. 7 tablet 0   oxyCODONE (ROXICODONE) 5 MG immediate release tablet Take 1 tablet (5 mg total) by mouth every 4 (four) hours as needed for severe pain or breakthrough pain. 30 tablet 0   oxyCODONE (ROXICODONE) 5 MG immediate release tablet Take 1 tablet (5 mg total) by mouth every 4 (four) hours as needed for severe pain or breakthrough pain. 20 tablet 0   Potassium Chloride ER 20 MEQ TBCR Take 20 mEq by mouth 2 (two) times daily. 180 tablet 0   Semaglutide-Weight Management 0.5 MG/0.5ML SOAJ Inject 0.5 mg into the skin once a week for 28 days. 2 mL 0   [START ON 03/22/2023] Semaglutide-Weight Management 1 MG/0.5ML SOAJ Inject 1 mg into the skin once a week for 28 days. 2 mL 0   [START ON 04/20/2023] Semaglutide-Weight Management 1.7 MG/0.75ML SOAJ Inject 1.7 mg into the skin once a week for 28 days. 3 mL 0   [START ON 05/19/2023]  Semaglutide-Weight Management 2.4 MG/0.75ML SOAJ Inject 2.4 mg into the skin once a week for 28 days. 3 mL 0   torsemide (DEMADEX) 10 MG tablet Take 3 tablets (30 mg total) by mouth daily. 270 tablet 3   traMADol (ULTRAM) 50 MG tablet Take 1 tablet (50 mg total) by mouth every 6 (six) hours as needed. 30 tablet 1   Ubrogepant (UBRELVY) 100 MG TABS Take 1 tablet (100 mg total) by mouth daily as needed. 30 tablet 1   valACYclovir (VALTREX) 500 MG tablet Take 500 mg by mouth as needed.     No current facility-administered medications for this visit.   No results found.  Review of Systems:   A ROS was performed including pertinent positives and negatives as documented in the HPI.   Musculoskeletal Exam:    Left leg is well-appearing.  Incisions are well-healed.  She is able to actively extend at the left knee with good quadricep strength able to fire tibialis anterior as well as gastrocsoleus.  2+ dorsalis pedis pulse.  Swelling is minimal.  Range of motion about the left knee is deferred.   Imaging:     I personally reviewed and interpreted the radiographs.   Assessment:   12 weeks status post left tibial tubercle osteotomy as well as patella MACI overall doing extremely well.  At this time I would like her to reengage with physical therapy to work on both the left side and the right side.  She will continue to work the strengthening portion of the protocol.  I will plan to see her back in 3 months for reassessment.  She will remain out of work until this time.  Will plan to obtain x-rays in 3 months when she returns. Plan :    -Return to clinic 12 weeks for reassessment      I personally saw and  evaluated the patient, and participated in the management and treatment plan.  [name], MD Attending Physician, Orthopedic Surgery  This document was dictated using Dragon voice recognition software. A reasonable attempt at proof reading has been made to minimize errors.

## 2023-03-16 ENCOUNTER — Encounter (HOSPITAL_BASED_OUTPATIENT_CLINIC_OR_DEPARTMENT_OTHER): Payer: Self-pay | Admitting: Orthopaedic Surgery

## 2023-03-18 ENCOUNTER — Other Ambulatory Visit (HOSPITAL_BASED_OUTPATIENT_CLINIC_OR_DEPARTMENT_OTHER): Payer: Self-pay

## 2023-03-18 ENCOUNTER — Encounter (HOSPITAL_BASED_OUTPATIENT_CLINIC_OR_DEPARTMENT_OTHER): Payer: Self-pay

## 2023-03-20 ENCOUNTER — Other Ambulatory Visit (HOSPITAL_BASED_OUTPATIENT_CLINIC_OR_DEPARTMENT_OTHER): Payer: Self-pay

## 2023-03-21 ENCOUNTER — Other Ambulatory Visit (HOSPITAL_BASED_OUTPATIENT_CLINIC_OR_DEPARTMENT_OTHER): Payer: Self-pay

## 2023-03-21 ENCOUNTER — Other Ambulatory Visit (HOSPITAL_COMMUNITY): Payer: Self-pay

## 2023-03-27 ENCOUNTER — Encounter (HOSPITAL_BASED_OUTPATIENT_CLINIC_OR_DEPARTMENT_OTHER): Payer: Self-pay | Admitting: Orthopaedic Surgery

## 2023-04-07 ENCOUNTER — Ambulatory Visit (HOSPITAL_BASED_OUTPATIENT_CLINIC_OR_DEPARTMENT_OTHER): Payer: Managed Care, Other (non HMO) | Attending: Orthopaedic Surgery | Admitting: Physical Therapy

## 2023-04-07 DIAGNOSIS — R2689 Other abnormalities of gait and mobility: Secondary | ICD-10-CM | POA: Insufficient documentation

## 2023-04-07 DIAGNOSIS — M25662 Stiffness of left knee, not elsewhere classified: Secondary | ICD-10-CM | POA: Insufficient documentation

## 2023-04-07 DIAGNOSIS — M25562 Pain in left knee: Secondary | ICD-10-CM | POA: Insufficient documentation

## 2023-04-10 ENCOUNTER — Other Ambulatory Visit (HOSPITAL_BASED_OUTPATIENT_CLINIC_OR_DEPARTMENT_OTHER): Payer: Self-pay

## 2023-04-10 ENCOUNTER — Encounter: Payer: Self-pay | Admitting: Family Medicine

## 2023-04-10 ENCOUNTER — Ambulatory Visit (INDEPENDENT_AMBULATORY_CARE_PROVIDER_SITE_OTHER): Payer: 59 | Admitting: Family Medicine

## 2023-04-10 VITALS — BP 112/78 | HR 111 | Temp 99.6°F | Ht 63.0 in | Wt 187.8 lb

## 2023-04-10 DIAGNOSIS — Z1152 Encounter for screening for COVID-19: Secondary | ICD-10-CM

## 2023-04-10 DIAGNOSIS — U071 COVID-19: Secondary | ICD-10-CM | POA: Diagnosis not present

## 2023-04-10 DIAGNOSIS — R6889 Other general symptoms and signs: Secondary | ICD-10-CM | POA: Diagnosis not present

## 2023-04-10 LAB — POCT INFLUENZA A/B
Influenza A, POC: POSITIVE — AB
Influenza B, POC: POSITIVE — AB

## 2023-04-10 LAB — POC COVID19 BINAXNOW: SARS Coronavirus 2 Ag: POSITIVE — AB

## 2023-04-10 MED ORDER — NIRMATRELVIR/RITONAVIR (PAXLOVID) TABLET (RENAL DOSING)
2.0000 | ORAL_TABLET | Freq: Two times a day (BID) | ORAL | 0 refills | Status: AC
Start: 2023-04-10 — End: 2023-04-15
  Filled 2023-04-10: qty 20, 5d supply, fill #0

## 2023-04-10 MED ORDER — HYDROCODONE BIT-HOMATROP MBR 5-1.5 MG/5ML PO SOLN
5.0000 mL | Freq: Four times a day (QID) | ORAL | 0 refills | Status: DC | PRN
Start: 2023-04-10 — End: 2023-04-14
  Filled 2023-04-10: qty 120, 6d supply, fill #0

## 2023-04-10 NOTE — Progress Notes (Signed)
Established Patient Office Visit   Subjective:  Patient ID: [name], female    DOB: 1976/02/07  Age: 47 y.o. MRN: [id number]  Chief Complaint  Patient presents with   Covid Positive    Headache, fever, fatigue, cough x 1 day.     HPI Encounter Diagnoses  Name Primary?   Flu-like symptoms Yes   Encounter for screening for COVID-19    COVID    1 to 2-day history of headache, postnasal drip, cough, body aches, elevated temperature.  Denies wheezing or difficulty breathing.   Review of Systems  Constitutional:  Positive for malaise/fatigue.  HENT:  Positive for congestion.   Eyes:  Negative for blurred vision, discharge and redness.  Respiratory:  Positive for cough. Negative for shortness of breath and wheezing.   Cardiovascular: Negative.   Gastrointestinal:  Negative for abdominal pain, nausea and vomiting.  Genitourinary: Negative.   Musculoskeletal:  Positive for myalgias.  Skin:  Negative for rash.  Neurological:  Positive for headaches. Negative for tingling, loss of consciousness and weakness.  Endo/Heme/Allergies:  Negative for polydipsia.     Current Outpatient Medications:    aspirin EC 325 MG tablet, Take 1 tablet (325 mg total) by mouth daily., Disp: 30 tablet, Rfl: 0   estradiol (VIVELLE-DOT) 0.1 MG/24HR patch, Place 1 patch onto the skin 2 (two) times a week., Disp: , Rfl:    HYDROcodone bit-homatropine (HYDROMET) 5-1.5 MG/5ML syrup, Take 5 mLs by mouth every 6 (six) hours as needed for cough., Disp: 120 mL, Rfl: 0   nirmatrelvir/ritonavir, renal dosing, (PAXLOVID) 10 x 150 MG & 10 x 100MG  TABS, Take 2 tablets by mouth 2 (two) times daily for 5 days. (Take nirmatrelvir 150 mg one tablet twice daily for 5 days and ritonavir 100 mg one tablet twice daily for 5 days) Patient GFR is 59  Hold Ubrelvy while taking., Disp: 20 tablet, Rfl: 0   [START ON 05/19/2023] Semaglutide-Weight Management 2.4 MG/0.75ML SOAJ, Inject 2.4 mg into the skin once a week for  28 days., Disp: 3 mL, Rfl: 0   torsemide (DEMADEX) 10 MG tablet, Take 3 tablets (30 mg total) by mouth daily., Disp: 270 tablet, Rfl: 3   Ubrogepant (UBRELVY) 100 MG TABS, Take 1 tablet (100 mg total) by mouth daily as needed., Disp: 30 tablet, Rfl: 1   valACYclovir (VALTREX) 500 MG tablet, Take 500 mg by mouth as needed., Disp: , Rfl:    acetaminophen (PAIN RELIEF EXTRA STRENGTH) 500 MG tablet, Take 1 tablet (500 mg total) by mouth every 8 (eight) hours. (Patient not taking: Reported on 04/10/2023), Disp: 30 tablet, Rfl: 0   estradiol (VIVELLE-DOT) 0.1 MG/24HR patch, Place 1 patch (0.1 mg total) onto the skin 2 (two) times a week., Disp: 24 patch, Rfl: 3   estradiol (VIVELLE-DOT) 0.1 MG/24HR patch, Place 1 patch (0.1 mg total) onto the skin 2 (two) times a week., Disp: 8 patch, Rfl: 6   meloxicam (MOBIC) 15 MG tablet, Take 1 tablet (15 mg total) by mouth daily. (Patient not taking: Reported on 04/10/2023), Disp: 7 tablet, Rfl: 0   Potassium Chloride ER 20 MEQ TBCR, Take 20 mEq by mouth 2 (two) times daily., Disp: 180 tablet, Rfl: 0   Semaglutide-Weight Management 1 MG/0.5ML SOAJ, Inject 1 mg into the skin once a week for 28 days. (Patient not taking: Reported on 04/10/2023), Disp: 2 mL, Rfl: 0   [START ON 04/20/2023] Semaglutide-Weight Management 1.7 MG/0.75ML SOAJ, Inject 1.7 mg into the skin once a week  for 28 days. (Patient not taking: Reported on 04/10/2023), Disp: 3 mL, Rfl: 0   Objective:     BP 112/78   Pulse (!) 111   Temp 99.6 F (37.6 C)   Ht 5\' 3"  (1.6 m)   Wt 187 lb 12.8 oz (85.2 kg)   LMP 01/12/2012   SpO2 98%   BMI 33.27 kg/m    Physical Exam Constitutional:      General: She is not in acute distress.    Appearance: Normal appearance. She is not ill-appearing, toxic-appearing or diaphoretic.  HENT:     Head: Normocephalic and atraumatic.     Right Ear: Tympanic membrane, ear canal and external ear normal.     Left Ear: Tympanic membrane, ear canal and external ear normal.   Eyes:     General: No scleral icterus.       Right eye: No discharge.        Left eye: No discharge.     Extraocular Movements: Extraocular movements intact.     Conjunctiva/sclera: Conjunctivae normal.     Pupils: Pupils are equal, round, and reactive to light.  Cardiovascular:     Rate and Rhythm: Normal rate and regular rhythm.  Pulmonary:     Effort: Pulmonary effort is normal. No respiratory distress.     Breath sounds: Normal breath sounds. No wheezing or rales.  Abdominal:     General: Bowel sounds are normal.  Musculoskeletal:     Cervical back: No rigidity or tenderness.  Lymphadenopathy:     Cervical: No cervical adenopathy.  Skin:    General: Skin is warm and dry.  Neurological:     Mental Status: She is alert and oriented to person, place, and time.  Psychiatric:        Mood and Affect: Mood normal.        Behavior: Behavior normal.      Results for orders placed or performed in visit on 04/10/23  POC COVID-19 BinaxNow  Result Value Ref Range   SARS Coronavirus 2 Ag Positive (A) Negative  POCT Influenza A/B  Result Value Ref Range   Influenza A, POC Positive (A) Negative   Influenza B, POC Positive (A) Negative      The 10-year ASCVD risk score (Arnett DK, et al., 2019) is: 4.3%    Assessment & Plan:   Flu-like symptoms -     POCT Influenza A/B  Encounter for screening for COVID-19 -     POC COVID-19 BinaxNow  COVID -     nirmatrelvir/ritonavir (renal dosing); Take 2 tablets by mouth 2 (two) times daily for 5 days. (Take nirmatrelvir 150 mg one tablet twice daily for 5 days and ritonavir 100 mg one tablet twice daily for 5 days) Patient GFR is 59  Hold Ubrelvy while taking.  Dispense: 20 tablet; Refill: 0 -     HYDROcodone Bit-Homatrop MBr; Take 5 mLs by mouth every 6 (six) hours as needed for cough.  Dispense: 120 mL; Refill: 0    Return in about 1 week (around 04/17/2023), or if symptoms worsen or fail to improve, for [facility].     Mliss Sax, MD

## 2023-04-13 ENCOUNTER — Encounter: Payer: Self-pay | Admitting: Internal Medicine

## 2023-04-14 ENCOUNTER — Telehealth (INDEPENDENT_AMBULATORY_CARE_PROVIDER_SITE_OTHER): Payer: 59 | Admitting: Internal Medicine

## 2023-04-14 ENCOUNTER — Other Ambulatory Visit (HOSPITAL_BASED_OUTPATIENT_CLINIC_OR_DEPARTMENT_OTHER): Payer: Self-pay

## 2023-04-14 DIAGNOSIS — R051 Acute cough: Secondary | ICD-10-CM

## 2023-04-14 DIAGNOSIS — B349 Viral infection, unspecified: Secondary | ICD-10-CM | POA: Diagnosis not present

## 2023-04-14 DIAGNOSIS — K5900 Constipation, unspecified: Secondary | ICD-10-CM | POA: Diagnosis not present

## 2023-04-14 DIAGNOSIS — R739 Hyperglycemia, unspecified: Secondary | ICD-10-CM

## 2023-04-14 MED ORDER — HYDROCODONE BIT-HOMATROP MBR 5-1.5 MG/5ML PO SOLN
5.0000 mL | Freq: Four times a day (QID) | ORAL | 0 refills | Status: AC | PRN
  Filled 2023-04-14: qty 180, 9d supply, fill #0
  Filled 2023-04-18: qty 180, 10d supply, fill #0

## 2023-04-14 NOTE — Progress Notes (Signed)
Patient ID: [name], female   DOB: 1976-02-14, 47 y.o.   MRN: [id number]  Virtual Visit via Video Note  I connected with [name] on 04/16/23 at  3:20 PM EDT by a video enabled telemedicine application and verified that I am speaking with the correct person using two identifiers.  Location of all participants today Patient: at home Provider: at office   I discussed the limitations of evaluation and management by telemedicine and the availability of in person appointments. The patient expressed understanding and agreed to proceed.  History of Present Illness: Here to f/u after covid + testing and tx 4 days ago, unfortuantely now with persistent hard coughing , can't sleep, with HA and side and back pains.  Also not drinking as much and noticed decreased urine volume.  Also with worsening constipation and nausea mild intermittent.  Pt denies chest pain, increased sob or doe, wheezing, orthopnea, PND, increased LE swelling, palpitations, dizziness or syncope.  Pt denies polydipsia, polyuria, or new focal neuro s/s.    Past Medical History:  Diagnosis Date   Adenomatous colon polyp 12/2012   Allergic rhinitis    Arthritis    Cellulitis    Common migraine    Diastolic dysfunction    Dysrhythmia    External hemorrhoid    Genital herpes    GERD (gastroesophageal reflux disease)    Hx of migraines    Hyperlipidemia    Hypertension    Lynch syndrome    Mitral regurgitation    MVP (mitral valve prolapse)    Plica syndrome of left knee    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   32 mm Sorin Memo 4D ring annuloplasty via right mini thoracotomy approach   Past Surgical History:  Procedure Laterality Date   ABDOMINAL HYSTERECTOMY     CHOLECYSTECTOMY N/A 01/16/2020   Procedure: LAPAROSCOPIC CHOLECYSTECTOMY WITH INTRAOPERATIVE CHOLANGIOGRAM;  Surgeon: Darnell Level, MD;  Location: [facility];  Service: General;  Laterality: N/A;   COLONOSCOPY     KNEE  ARTHROSCOPY WITH MACI CARTILAGE HARVEST Left 10/04/2022   Procedure: LEFT KNEE ARTHROSCOPY WITH MACI CARTILAGE HARVEST;  Surgeon: [name], MD;  Location: [facility];  Service: Orthopedics;  Laterality: Left;   KNEE ARTHROSCOPY WITH MEDIAL MENISECTOMY Left 06/07/2013   Procedure: LEFT KNEE ARTHROSCOPY WITH SYNOVECTOMY LIMITED, ARTHROSCOPY KNEE WITH DEBRIDEMENT/SHAVING (CHONDROPLASTY), ARTHROSCOPY KNEE  ChondroplastyWITH MEDIAL AND LATERAL  MENISECTOMY;  Surgeon: Sheral Apley, MD;  Location: [facility];  Service: Orthopedics;  Laterality: Left;   KNEE ARTHROSCOPY WITH MEDIAL PATELLAR FEMORAL LIGAMENT RECONSTRUCTION Left 12/20/2022   Procedure: KNEE ARTHROSCOPY WITH MEDIAL PATELLAR FEMORAL LIGAMENT RECONSTRUCTION;  Surgeon: [name], MD;  Location: [facility];  Service: Orthopedics;  Laterality: Left;   left knee arthroscopy     MITRAL VALVE REPAIR Right 11/28/2018   Procedure: MINIMALLY INVASIVE MITRAL VALVE REPAIR (MVR) - Using Memo 4D Ring Size 32;  Surgeon: Purcell Nails, MD;  Location: [facility] OR;  Service: Open Heart Surgery;  Laterality: Right;   patrial hysterectomy     POLYPECTOMY     SALPINGOOPHORECTOMY  2022   SYNOVECTOMY Left 06/07/2013   Procedure: SYNOVECTOMY;  Surgeon: Sheral Apley, MD;  Location: [facility];  Service: Orthopedics;  Laterality: Left;   TEE WITHOUT CARDIOVERSION  10/11/2011   Procedure: TRANSESOPHAGEAL ECHOCARDIOGRAM (TEE);  Surgeon: Wendall Stade, MD;  Location: [facility];  Service: Cardiovascular;  Laterality: N/A;   TEE WITHOUT CARDIOVERSION N/A 11/28/2018  Procedure: TRANSESOPHAGEAL ECHOCARDIOGRAM (TEE);  Surgeon: Purcell Nails, MD;  Location[facility];  Service: Open Heart Surgery;  Laterality: N/A;   TUBAL LIGATION     WISDOM TOOTH EXTRACTION      reports that she quit smoking about 12 years ago. Her smoking use included cigars and cigarettes. She started smoking about 24 years  ago. She has a 6 pack-year smoking history. She has never used smokeless tobacco. She reports current alcohol use of about 2.0 standard drinks of alcohol per week. She reports that she does not use drugs. family history includes Breast cancer in her cousin and another family member; Colon cancer (age of onset: 77) in her paternal aunt; Colon cancer (age of onset: 52) in her paternal uncle; Colon cancer (age of onset: 41) in her paternal uncle; Colon cancer (age of onset: 49) in her father; Colon polyps in her maternal uncle; Multiple myeloma (age of onset: 8) in her maternal grandmother; Other in her daughter and mother; Parkinsonism in her paternal grandfather; Prostate cancer (age of onset: 28) in her maternal uncle. Allergies  Allergen Reactions   Bee Venom    Current Outpatient Medications on File Prior to Visit  Medication Sig Dispense Refill   acetaminophen (PAIN RELIEF EXTRA STRENGTH) 500 MG tablet Take 1 tablet (500 mg total) by mouth every 8 (eight) hours. (Patient not taking: Reported on 04/10/2023) 30 tablet 0   aspirin EC 325 MG tablet Take 1 tablet (325 mg total) by mouth daily. 30 tablet 0   estradiol (VIVELLE-DOT) 0.1 MG/24HR patch Place 1 patch onto the skin 2 (two) times a week.     estradiol (VIVELLE-DOT) 0.1 MG/24HR patch Place 1 patch (0.1 mg total) onto the skin 2 (two) times a week. 24 patch 3   estradiol (VIVELLE-DOT) 0.1 MG/24HR patch Place 1 patch (0.1 mg total) onto the skin 2 (two) times a week. 8 patch 6   meloxicam (MOBIC) 15 MG tablet Take 1 tablet (15 mg total) by mouth daily. (Patient not taking: Reported on 04/10/2023) 7 tablet 0   Potassium Chloride ER 20 MEQ TBCR Take 20 mEq by mouth 2 (two) times daily. 180 tablet 0   Semaglutide-Weight Management 1 MG/0.5ML SOAJ Inject 1 mg into the skin once a week for 28 days. (Patient not taking: Reported on 04/10/2023) 2 mL 0   [START ON 04/20/2023] Semaglutide-Weight Management 1.7 MG/0.75ML SOAJ Inject 1.7 mg into the skin  once a week for 28 days. (Patient not taking: Reported on 04/10/2023) 3 mL 0   [START ON 05/19/2023] Semaglutide-Weight Management 2.4 MG/0.75ML SOAJ Inject 2.4 mg into the skin once a week for 28 days. 3 mL 0   torsemide (DEMADEX) 10 MG tablet Take 3 tablets (30 mg total) by mouth daily. 270 tablet 3   Ubrogepant (UBRELVY) 100 MG TABS Take 1 tablet (100 mg total) by mouth daily as needed. 30 tablet 1   valACYclovir (VALTREX) 500 MG tablet Take 500 mg by mouth as needed.     No current facility-administered medications on file prior to visit.    Observations/Objective: Alert, NAD, appropriate mood and affect, resps normal, cn 2-12 intact, moves all 4s, no visible rash or swelling Lab Results  Component Value Date   WBC 6.3 10/27/2022   HGB 14.2 10/27/2022   HCT 42.7 10/27/2022   PLT 316.0 10/27/2022   GLUCOSE 134 (H) 12/20/2022   CHOL 220 (H) 10/27/2022   TRIG 156.0 (H) 10/27/2022   HDL 49.00 10/27/2022   LDLCALC 140 (  H) 10/27/2022   ALT 16 10/27/2022   AST 17 10/27/2022   NA 140 12/20/2022   K 2.8 (L) 12/20/2022   CL 100 12/20/2022   CREATININE 1.15 (H) 12/20/2022   BUN 10 12/20/2022   CO2 29 12/20/2022   TSH 1.51 10/27/2022   INR 2.3 02/05/2019   HGBA1C 5.7 10/27/2022   Assessment and Plan: See notes  Follow Up Instructions: See notes   I discussed the assessment and treatment plan with the patient. The patient was provided an opportunity to ask questions and all were answered. The patient agreed with the plan and demonstrated an understanding of the instructions.   The patient was advised to call back or seek an in-person evaluation if the symptoms worsen or if the condition fails to improve as anticipated.   Oliver Barre, MD

## 2023-04-16 ENCOUNTER — Encounter: Payer: Self-pay | Admitting: Internal Medicine

## 2023-04-16 DIAGNOSIS — R059 Cough, unspecified: Secondary | ICD-10-CM | POA: Insufficient documentation

## 2023-04-16 DIAGNOSIS — K5900 Constipation, unspecified: Secondary | ICD-10-CM | POA: Insufficient documentation

## 2023-04-16 NOTE — Assessment & Plan Note (Addendum)
With mild worsening with less po fluids and less active, now for increased activity, fluids, and miralax 17 gm every day prn, also for lab includin ua, cbc, bmp

## 2023-04-16 NOTE — Assessment & Plan Note (Signed)
Lab Results  Component Value Date   HGBA1C 5.7 10/27/2022   Stable, pt to continue current medical treatment  - diet, wt control

## 2023-04-16 NOTE — Assessment & Plan Note (Signed)
Unfortunately persistent still mod to severe, unable to sleep with multiple somatic areas of pain - for hycodan prn

## 2023-04-16 NOTE — Patient Instructions (Signed)
Please take all new medication as prescribed 

## 2023-04-17 ENCOUNTER — Encounter (HOSPITAL_BASED_OUTPATIENT_CLINIC_OR_DEPARTMENT_OTHER): Payer: Self-pay | Admitting: Orthopaedic Surgery

## 2023-04-18 ENCOUNTER — Other Ambulatory Visit (HOSPITAL_BASED_OUTPATIENT_CLINIC_OR_DEPARTMENT_OTHER): Payer: Self-pay

## 2023-04-18 ENCOUNTER — Other Ambulatory Visit: Payer: Self-pay

## 2023-04-19 ENCOUNTER — Ambulatory Visit (INDEPENDENT_AMBULATORY_CARE_PROVIDER_SITE_OTHER): Payer: 59 | Admitting: Orthopaedic Surgery

## 2023-04-19 ENCOUNTER — Ambulatory Visit (HOSPITAL_BASED_OUTPATIENT_CLINIC_OR_DEPARTMENT_OTHER): Payer: 59

## 2023-04-19 DIAGNOSIS — G8929 Other chronic pain: Secondary | ICD-10-CM

## 2023-04-19 DIAGNOSIS — M7631 Iliotibial band syndrome, right leg: Secondary | ICD-10-CM | POA: Diagnosis not present

## 2023-04-19 DIAGNOSIS — M25561 Pain in right knee: Secondary | ICD-10-CM

## 2023-04-19 MED ORDER — LIDOCAINE HCL 1 % IJ SOLN
4.0000 mL | INTRAMUSCULAR | Status: AC | PRN
Start: 2023-04-19 — End: 2023-04-19
  Administered 2023-04-19: 4 mL

## 2023-04-19 MED ORDER — TRIAMCINOLONE ACETONIDE 40 MG/ML IJ SUSP
80.0000 mg | INTRAMUSCULAR | Status: AC | PRN
Start: 2023-04-19 — End: 2023-04-19
  Administered 2023-04-19: 80 mg via INTRA_ARTICULAR

## 2023-04-19 NOTE — Progress Notes (Signed)
Post Operative Evaluation    Procedure/Date of Surgery: Left knee MACI and tibial tubercle osteotomy 6/18  Interval History:    Presents today status post the above procedure.  Presents today overall continuing to improve with regard to the left knee.  With regard to the right knee she is experiencing pain about the lateral aspect of the knee.  This is over the IT band.  She states that she is having weakness on the left which is somewhat causing overuse on the right.  PMH/PSH/Family History/Social History/Meds/Allergies:    Past Medical History:  Diagnosis Date  . Adenomatous colon polyp 12/2012  . Allergic rhinitis   . Arthritis   . Cellulitis   . Common migraine   . Diastolic dysfunction   . Dysrhythmia   . External hemorrhoid   . Genital herpes   . GERD (gastroesophageal reflux disease)   . Hx of migraines   . Hyperlipidemia   . Hypertension   . Lynch syndrome   . Mitral regurgitation   . MVP (mitral valve prolapse)   . Plica syndrome of left knee   . 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 ABDOMINAL HYSTERECTOMY    . CHOLECYSTECTOMY N/A 01/16/2020   Procedure: LAPAROSCOPIC CHOLECYSTECTOMY WITH INTRAOPERATIVE CHOLANGIOGRAM;  Surgeon: Darnell Level, MD;  Location: [facility];  Service: General;  Laterality: N/A;  . COLONOSCOPY    . KNEE ARTHROSCOPY WITH MACI CARTILAGE HARVEST Left 10/04/2022   Procedure: LEFT KNEE ARTHROSCOPY WITH MACI CARTILAGE HARVEST;  Surgeon: [name], MD;  Location: [facility];  Service: Orthopedics;  Laterality: Left;  . KNEE ARTHROSCOPY WITH MEDIAL MENISECTOMY Left 06/07/2013   Procedure: LEFT KNEE ARTHROSCOPY WITH SYNOVECTOMY LIMITED, ARTHROSCOPY KNEE WITH DEBRIDEMENT/SHAVING (CHONDROPLASTY), ARTHROSCOPY KNEE  ChondroplastyWITH MEDIAL AND LATERAL   MENISECTOMY;  Surgeon: Sheral Apley, MD;  Location: [facility];  Service: Orthopedics;  Laterality: Left;  . KNEE ARTHROSCOPY WITH MEDIAL PATELLAR FEMORAL LIGAMENT RECONSTRUCTION Left 12/20/2022   Procedure: KNEE ARTHROSCOPY WITH MEDIAL PATELLAR FEMORAL LIGAMENT RECONSTRUCTION;  Surgeon: [name], MD;  Location: [facility];  Service: Orthopedics;  Laterality: Left;  . left knee arthroscopy    . MITRAL VALVE REPAIR Right 11/28/2018   Procedure: MINIMALLY INVASIVE MITRAL VALVE REPAIR (MVR) - Using Memo 4D Ring Size 32;  Surgeon: Purcell Nails, MD;  Location: [facility] OR;  Service: Open Heart Surgery;  Laterality: Right;  . patrial hysterectomy    . POLYPECTOMY    . SALPINGOOPHORECTOMY  2022  . SYNOVECTOMY Left 06/07/2013   Procedure: SYNOVECTOMY;  Surgeon: Sheral Apley, MD;  Location:  SURGERY CENTER;  Service: Orthopedics;  Laterality: Left;  . TEE WITHOUT CARDIOVERSION  10/11/2011   Procedure: TRANSESOPHAGEAL ECHOCARDIOGRAM (TEE);  Surgeon: Wendall Stade, MD;  Location: [facility];  Service: Cardiovascular;  Laterality: N/A;  . TEE WITHOUT CARDIOVERSION N/A 11/28/2018   Procedure: TRANSESOPHAGEAL ECHOCARDIOGRAM (TEE);  Surgeon: Purcell Nails, MD;  Location: [facility] OR;  Service: Open Heart Surgery;  Laterality: N/A;  . TUBAL LIGATION    . WISDOM TOOTH EXTRACTION     Social History   Socioeconomic History  . Marital status: Married    Spouse  name: Barbara Cower  . Number of children: 2  . Years of education: Not on file  . Highest education level: Master's degree (e.g., MA, MS, MEng, MEd, MSW, MBA)  Occupational History  . Occupation: patient account rep    Employer: [employer]  Tobacco Use  . Smoking status: Former    Current packs/day: 0.00    Average packs/day: 0.5 packs/day for 12.0 years (6.0 ttl pk-yrs)    Types: Cigars, Cigarettes    Start date: 11/29/1998    Quit date: 11/29/2010    Years since quitting: 12.3  . Smokeless  tobacco: Never  Vaping Use  . Vaping status: Never Used  Substance and Sexual Activity  . Alcohol use: Yes    Alcohol/week: 2.0 standard drinks of alcohol    Types: 2 Glasses of wine per week    Comment: social  . Drug use: No  . Sexual activity: Yes    Birth control/protection: Surgical  Other Topics Concern  . Not on file  Social History Narrative  . Not on file   Social Determinants of Health   Financial Resource Strain: Low Risk  (01/23/2023)   Overall Financial Resource Strain (CARDIA)   . Difficulty of Paying Living Expenses: Not very hard  Food Insecurity: No Food Insecurity (01/23/2023)   Hunger Vital Sign   . Worried About Programme researcher, broadcasting/film/video in the Last Year: Never true   . Ran Out of Food in the Last Year: Never true  Transportation Needs: No Transportation Needs (01/23/2023)   PRAPARE - Transportation   . Lack of Transportation (Medical): No   . Lack of Transportation (Non-Medical): No  Physical Activity: Unknown (01/23/2023)   Exercise Vital Sign   . Days of Exercise per Week: 0 days   . Minutes of Exercise per Session: Not on file  Stress: No Stress Concern Present (01/23/2023)   [employer] of Occupational Health - Occupational Stress Questionnaire   . Feeling of Stress : Not at all  Social Connections: Socially Integrated (01/23/2023)   Social Connection and Isolation Panel [NHANES]   . Frequency of Communication with Friends and Family: More than three times a week   . Frequency of Social Gatherings with Friends and Family: More than three times a week   . Attends Religious Services: More than 4 times per year   . Active Member of Clubs or Organizations: Yes   . Attends Banker Meetings: More than 4 times per year   . Marital Status: Married   Family History  Problem Relation Age of Onset  . Other Mother        lynch syndrome  . Colon cancer Father 80  . Colon polyps Maternal Uncle   . Prostate cancer Maternal Uncle 64  . Colon cancer  Paternal Aunt 30  . Colon cancer Paternal Uncle 81  . Colon cancer Paternal Uncle 5  . Multiple myeloma Maternal Grandmother 71  . Parkinsonism Paternal Grandfather   . Other Daughter        Lynch syndrome  . Breast cancer Cousin        lynch syndrome   . Breast cancer Other        Paternal Programme researcher, broadcasting/film/video  . Esophageal cancer Neg Hx   . Rectal cancer Neg Hx    Allergies  Allergen Reactions  . Bee Venom    Current Outpatient Medications  Medication Sig Dispense Refill  . acetaminophen (PAIN RELIEF EXTRA STRENGTH) 500 MG tablet Take 1 tablet (500  mg total) by mouth every 8 (eight) hours. (Patient not taking: Reported on 04/10/2023) 30 tablet 0  . aspirin EC 325 MG tablet Take 1 tablet (325 mg total) by mouth daily. 30 tablet 0  . estradiol (VIVELLE-DOT) 0.1 MG/24HR patch Place 1 patch onto the skin 2 (two) times a week.    . estradiol (VIVELLE-DOT) 0.1 MG/24HR patch Place 1 patch (0.1 mg total) onto the skin 2 (two) times a week. 24 patch 3  . estradiol (VIVELLE-DOT) 0.1 MG/24HR patch Place 1 patch (0.1 mg total) onto the skin 2 (two) times a week. 8 patch 6  . HYDROcodone bit-homatropine (HYCODAN) 5-1.5 MG/5ML syrup Take 5 mLs by mouth every 6 (six) hours as needed for up to 10 days. 180 mL 0  . meloxicam (MOBIC) 15 MG tablet Take 1 tablet (15 mg total) by mouth daily. (Patient not taking: Reported on 04/10/2023) 7 tablet 0  . Potassium Chloride ER 20 MEQ TBCR Take 20 mEq by mouth 2 (two) times daily. 180 tablet 0  . Semaglutide-Weight Management 1 MG/0.5ML SOAJ Inject 1 mg into the skin once a week for 28 days. (Patient not taking: Reported on 04/10/2023) 2 mL 0  . [START ON 04/20/2023] Semaglutide-Weight Management 1.7 MG/0.75ML SOAJ Inject 1.7 mg into the skin once a week for 28 days. (Patient not taking: Reported on 04/10/2023) 3 mL 0  . [START ON 05/19/2023] Semaglutide-Weight Management 2.4 MG/0.75ML SOAJ Inject 2.4 mg into the skin once a week for 28 days. 3 mL 0  . torsemide  (DEMADEX) 10 MG tablet Take 3 tablets (30 mg total) by mouth daily. 270 tablet 3  . Ubrogepant (UBRELVY) 100 MG TABS Take 1 tablet (100 mg total) by mouth daily as needed. 30 tablet 1  . valACYclovir (VALTREX) 500 MG tablet Take 500 mg by mouth as needed.     No current facility-administered medications for this visit.   No results found.  Review of Systems:   A ROS was performed including pertinent positives and negatives as documented in the HPI.   Musculoskeletal Exam:    Left leg is well-appearing.  Incisions are well-healed.  She is able to actively extend at the left knee with good quadricep strength able to fire tibialis anterior as well as gastrocsoleus.  2+ dorsalis pedis pulse.  No swelling.  Range of motion is from 0 to 120 degrees with strong extension on the left with improved quadriceps tone   Right knee with tenderness about the lateral IT band.  Range of motion is from -5 to 1 to 35 degrees.  No joint line tenderness.   Imaging:     I personally reviewed and interpreted the radiographs.   Assessment:   5 months status post left tibial tubercle osteotomy as well as patella MACI overall doing extremely well.  Regard to the right knee she does appear to have evidence of IT band tendinitis today.  Given this I have recommended ultrasound-guided injection into the IT band for both therapeutic and diagnostic purposes.  I will plan to see her back in 2 months for reassessment Plan :    -Return to clinic 2 months for reassessment, will plan to keep her out of work until that time    Procedure Note  Patient: NAELI GEIS             Date of Birth: 1975-08-25           MRN: [id number]  Visit Date: 04/19/2023  Procedures: Visit Diagnoses:  1. Chronic pain of right knee   2. It band syndrome, right     Large Joint Inj: R knee on 04/19/2023 3:45 PM Indications: pain Details: 22 G 1.5 in needle, ultrasound-guided anterior approach  Arthrogram:  No  Medications: 4 mL lidocaine 1 %; 80 mg triamcinolone acetonide 40 MG/ML Outcome: tolerated well, no immediate complications Procedure, treatment alternatives, risks and benefits explained, specific risks discussed. Consent was given by the patient. Immediately prior to procedure a time out was called to verify the correct patient, procedure, equipment, support staff and site/side marked as required. Patient was prepped and draped in the usual sterile fashion.         I personally saw and evaluated the patient, and participated in the management and treatment plan.  [name], MD Attending Physician, Orthopedic Surgery  This document was dictated using Dragon voice recognition software. A reasonable attempt at proof reading has been made to minimize errors.

## 2023-04-20 ENCOUNTER — Other Ambulatory Visit (HOSPITAL_BASED_OUTPATIENT_CLINIC_OR_DEPARTMENT_OTHER): Payer: Self-pay

## 2023-05-02 ENCOUNTER — Other Ambulatory Visit (HOSPITAL_BASED_OUTPATIENT_CLINIC_OR_DEPARTMENT_OTHER): Payer: Self-pay

## 2023-05-03 ENCOUNTER — Other Ambulatory Visit (HOSPITAL_BASED_OUTPATIENT_CLINIC_OR_DEPARTMENT_OTHER): Payer: Self-pay

## 2023-05-04 ENCOUNTER — Emergency Department (HOSPITAL_BASED_OUTPATIENT_CLINIC_OR_DEPARTMENT_OTHER)
Admission: EM | Admit: 2023-05-04 | Discharge: 2023-05-04 | Disposition: A | Discharge status: Home / Self Care | Payer: 59 | Attending: Emergency Medicine | Admitting: Emergency Medicine

## 2023-05-04 ENCOUNTER — Encounter (HOSPITAL_BASED_OUTPATIENT_CLINIC_OR_DEPARTMENT_OTHER): Payer: Self-pay

## 2023-05-04 DIAGNOSIS — R519 Headache, unspecified: Secondary | ICD-10-CM | POA: Diagnosis present

## 2023-05-04 DIAGNOSIS — Z7982 Long term (current) use of aspirin: Secondary | ICD-10-CM | POA: Insufficient documentation

## 2023-05-04 LAB — CBC
HCT: 41.4 % (ref 36.0–46.0)
Hemoglobin: 14 g/dL (ref 12.0–15.0)
MCH: 30.4 pg (ref 26.0–34.0)
MCHC: 33.8 g/dL (ref 30.0–36.0)
MCV: 89.8 fL (ref 80.0–100.0)
Platelets: 289 10*3/uL (ref 150–400)
RBC: 4.61 MIL/uL (ref 3.87–5.11)
RDW: 16.1 % — ABNORMAL HIGH (ref 11.5–15.5)
WBC: 11.6 10*3/uL — ABNORMAL HIGH (ref 4.0–10.5)
nRBC: 0 % (ref 0.0–0.2)

## 2023-05-04 LAB — BASIC METABOLIC PANEL
Anion gap: 8 (ref 5–15)
BUN: 17 mg/dL (ref 6–20)
CO2: 30 mmol/L (ref 22–32)
Calcium: 9.3 mg/dL (ref 8.9–10.3)
Chloride: 103 mmol/L (ref 98–111)
Creatinine, Ser: 1.05 mg/dL — ABNORMAL HIGH (ref 0.44–1.00)
GFR, Estimated: >60 mL/min (ref >60)
Glucose, Bld: 93 mg/dL (ref 70–99)
Potassium: 3.6 mmol/L (ref 3.5–5.1)
Sodium: 141 mmol/L (ref 135–145)

## 2023-05-04 MED ORDER — METOCLOPRAMIDE HCL 5 MG/ML IJ SOLN
10.0000 mg | Freq: Once | INTRAMUSCULAR | Status: AC
  Administered 2023-05-04: 10 mg via INTRAVENOUS
  Filled 2023-05-04: qty 2

## 2023-05-04 MED ORDER — DIPHENHYDRAMINE HCL 50 MG/ML IJ SOLN
12.5000 mg | Freq: Once | INTRAMUSCULAR | Status: AC
  Administered 2023-05-04: 12.5 mg via INTRAVENOUS
  Filled 2023-05-04: qty 1

## 2023-05-04 MED ORDER — SODIUM CHLORIDE 0.9 % IV BOLUS: 1000.0000 mL | Freq: Once | INTRAVENOUS | Status: DC

## 2023-05-04 MED ORDER — KETOROLAC TROMETHAMINE 15 MG/ML IJ SOLN
15.0000 mg | Freq: Once | INTRAMUSCULAR | Status: AC
  Administered 2023-05-04: 15 mg via INTRAVENOUS
  Filled 2023-05-04: qty 1

## 2023-05-04 NOTE — ED Triage Notes (Signed)
She c/o right-sided h/a which she recognizes as a typical migraine. She denies fever, nor any other sign of current illness.

## 2023-05-04 NOTE — ED Provider Notes (Signed)
[place] EMERGENCY DEPARTMENT AT [facility] Provider Note   CSN: [id number] Arrival date & time: 05/04/23  1743     History  Chief Complaint  Patient presents with   Migraine    [name] is a 47 y.o. female with past medical history of mitral regurg, Lynch syndrome, hyperlipidemia, history of migraines presenting to emergency room with migraine has been ongoing since this morning patient reports it feels like her typical migraine however has not gone away.  Patient reports right sided headache, quite painful and associated with sensitivity to noise and slightly sensitive to light.  Patient has no focal neurological symptoms, no other associated symptoms.  No recent illness or trauma.   Migraine Associated symptoms include headaches.       Home Medications Prior to Admission medications   Medication Sig Start Date End Date Taking? Authorizing Provider  acetaminophen (PAIN RELIEF EXTRA STRENGTH) 500 MG tablet Take 1 tablet (500 mg total) by mouth every 8 (eight) hours. Patient not taking: Reported on 04/10/2023 02/01/23   [name], MD  aspirin EC 325 MG tablet Take 1 tablet (325 mg total) by mouth daily. 12/20/22   [name], MD  estradiol (VIVELLE-DOT) 0.1 MG/24HR patch Place 1 patch onto the skin 2 (two) times a week. 05/29/21   [provider]  estradiol (VIVELLE-DOT) 0.1 MG/24HR patch Place 1 patch (0.1 mg total) onto the skin 2 (two) times a week. 12/26/22     estradiol (VIVELLE-DOT) 0.1 MG/24HR patch Place 1 patch (0.1 mg total) onto the skin 2 (two) times a week. 04/04/22     meloxicam (MOBIC) 15 MG tablet Take 1 tablet (15 mg total) by mouth daily. Patient not taking: Reported on 04/10/2023 10/20/22   [name], MD  Potassium Chloride ER 20 MEQ TBCR Take 20 mEq by mouth 2 (two) times daily. 05/31/21 12/20/22  Rollene Rotunda, MD  Semaglutide-Weight Management 1.7 MG/0.75ML SOAJ Inject 1.7 mg into the skin once a week for 28  days. Patient not taking: Reported on 04/10/2023 04/20/23 05/31/23  Etta Grandchild, MD  Semaglutide-Weight Management 2.4 MG/0.75ML SOAJ Inject 2.4 mg into the skin once a week for 28 days. 05/19/23 06/16/23  Etta Grandchild, MD  torsemide (DEMADEX) 10 MG tablet Take 3 tablets (30 mg total) by mouth daily. 05/31/22   Rollene Rotunda, MD  Ubrogepant (UBRELVY) 100 MG TABS Take 1 tablet (100 mg total) by mouth daily as needed. 10/27/22   Etta Grandchild, MD  valACYclovir (VALTREX) 500 MG tablet Take 500 mg by mouth as needed. 06/18/21   [provider]      Allergies    Bee venom    Review of Systems   Review of Systems  Neurological:  Positive for headaches.    Physical Exam Updated Vital Signs BP (!) 125/90   Pulse 85   Temp 97.8 F (36.6 C)   Resp 16   LMP 01/12/2012   SpO2 94%  Physical Exam Vitals and nursing note reviewed.  Constitutional:      General: She is not in acute distress.    Appearance: She is not toxic-appearing.  HENT:     Head: Normocephalic and atraumatic.  Eyes:     General: No scleral icterus.    Extraocular Movements: Extraocular movements intact.     Conjunctiva/sclera: Conjunctivae normal.     Pupils: Pupils are equal, round, and reactive to light.  Cardiovascular:     Rate and Rhythm: Normal rate and regular rhythm.  Pulses: Normal pulses.     Heart sounds: Normal heart sounds.  Pulmonary:     Effort: Pulmonary effort is normal. No respiratory distress.     Breath sounds: Normal breath sounds.  Abdominal:     General: Abdomen is flat. Bowel sounds are normal.     Palpations: Abdomen is soft.     Tenderness: There is no abdominal tenderness.  Musculoskeletal:     Right lower leg: No edema.     Left lower leg: No edema.  Skin:    General: Skin is warm and dry.     Capillary Refill: Capillary refill takes less than 2 seconds.     Findings: No lesion.  Neurological:     General: No focal deficit present.     Mental Status: She is  alert and oriented to person, place, and time. Mental status is at baseline.     Cranial Nerves: No cranial nerve deficit.     Sensory: No sensory deficit.     Motor: No weakness.     Coordination: Coordination normal.     Gait: Gait normal.     ED Results / Procedures / Treatments   Labs (all labs ordered are listed, but only abnormal results are displayed) Labs Reviewed - No data to display  EKG None  Radiology No results found.  Procedures Procedures    Medications Ordered in ED Medications - No data to display  ED Course/ Medical Decision Making/ A&P                                 Medical Decision Making Amount and/or Complexity of Data Reviewed Labs: ordered.  Risk Prescription drug management.   [name] 47 y.o. presented today for HA. Working Ddx: tension headache, migraine, intracranial mass, intracranial hemorrhage, intracranial infection including meningitis vs encephalitis, trigeminal neuralgia, AVM, sinusitis, cerebral aneurysm, muscular headache, cavernous sinus thrombosis, carotid artery dissection.  R/o DDx intracranial mass, hemorrhage,or infection including meningitis vs encephalitis, trigeminal neuralgia, AVM, cerebral aneurysm, muscular headache, cavernous sinus thrombosis, carotid artery dissection are less likely due to history of present illness, physical exam, labs/imaging findings  PMHX: mitral regurg, Lynch syndrome, hyperlipidemia, history of migraines  Review of prior external notes: None  Unique Tests and My Interpretation:  CBC: White blood cell count 11.6, no signs of active infection, no anemia CMP: Electrolytes with no abnormality, creatinine 1.05  CT of the head considered however patient has not had any change in typical migraine pattern and has no focal neurological symptoms on exam   Problem List / ED Course / Critical interventions / Medication management  Patient reporting to emergency room after headache.   Patient has no focal neurological deficits and normal neurological exam.  Symptoms are consistent with migraine headache.  No recent trauma injury or fall.  Given the patient has had migraines like this in the past without order further imaging.  Given migraine cocktail and will reassess. Patient reassessed after medication and feels that her symptoms have resolved.  Given improvement in symptoms will discharge with outpatient follow-up.  Had conversation about return precautions as well as discussing prophylactic migraine medication with primary care doctor.  Patient agrees and understands plan. I ordered medication including Toradol, Reglan, Benadryl for headache Reevaluation of the patient after these medicines showed that the patient resolved Patients vitals assessed. Upon arrival patient is hemodynamically stable.  I have reviewed the patients home medicines and have  made adjustments as needed     Plan:   F/u w/ PCP in 2-3d to ensure resolution of sx.  Patient was given return precautions. Patient stable for discharge at this time.  Patient educated on sx/dx and verbalized understanding of plan. Return to ED if new or worsening sx.           Final Clinical Impression(s) / ED Diagnoses Final diagnoses:  None    Rx / DC Orders ED Discharge Orders     None         Smitty Knudsen, PA-C 05/04/23 2110    Elayne Snare K, DO 05/04/23 2315

## 2023-05-04 NOTE — Discharge Instructions (Addendum)
You were seen in the emergency room today for headache.  I would recommend following up with primary care to ensure resolution of symptoms including discussing prophylactic migraine treatment if needed.  Your labs were unremarkable with no sign of anemia or electrolyte abnormality.  Please return to the emergency room with any new or worsening symptoms.

## 2023-05-11 ENCOUNTER — Other Ambulatory Visit (HOSPITAL_BASED_OUTPATIENT_CLINIC_OR_DEPARTMENT_OTHER): Payer: Self-pay

## 2023-05-17 ENCOUNTER — Ambulatory Visit (HOSPITAL_BASED_OUTPATIENT_CLINIC_OR_DEPARTMENT_OTHER): Payer: 59 | Attending: Orthopaedic Surgery | Admitting: Physical Therapy

## 2023-05-17 DIAGNOSIS — M25662 Stiffness of left knee, not elsewhere classified: Secondary | ICD-10-CM | POA: Insufficient documentation

## 2023-05-17 DIAGNOSIS — M25562 Pain in left knee: Secondary | ICD-10-CM | POA: Insufficient documentation

## 2023-05-17 DIAGNOSIS — R2689 Other abnormalities of gait and mobility: Secondary | ICD-10-CM | POA: Insufficient documentation

## 2023-05-19 ENCOUNTER — Other Ambulatory Visit (HOSPITAL_BASED_OUTPATIENT_CLINIC_OR_DEPARTMENT_OTHER): Payer: Self-pay

## 2023-05-23 ENCOUNTER — Other Ambulatory Visit (HOSPITAL_BASED_OUTPATIENT_CLINIC_OR_DEPARTMENT_OTHER): Payer: Self-pay

## 2023-05-24 ENCOUNTER — Other Ambulatory Visit (HOSPITAL_BASED_OUTPATIENT_CLINIC_OR_DEPARTMENT_OTHER): Payer: Self-pay

## 2023-05-24 ENCOUNTER — Other Ambulatory Visit (HOSPITAL_BASED_OUTPATIENT_CLINIC_OR_DEPARTMENT_OTHER): Payer: Self-pay | Admitting: Orthopaedic Surgery

## 2023-05-24 ENCOUNTER — Encounter (HOSPITAL_BASED_OUTPATIENT_CLINIC_OR_DEPARTMENT_OTHER): Payer: Self-pay | Admitting: Orthopaedic Surgery

## 2023-05-24 MED ORDER — TRAMADOL HCL 50 MG PO TABS
50.0000 mg | ORAL_TABLET | Freq: Four times a day (QID) | ORAL | 1 refills | Status: DC | PRN
  Filled 2023-05-24: qty 30, 7d supply, fill #0

## 2023-05-24 NOTE — Progress Notes (Signed)
Cardiology Office Note:   Date:  05/26/2023  ID:  [name], DOB 24-Mar-1976, MRN [id number] PCP: Etta Grandchild, MD  [facility] Providers Cardiologist:  Rollene Rotunda, MD {  History of Present Illness:   [name] is a 47 y.o. female who presents for follow up of mitral valve prolapse and mitral regurgitation noted in 2013 with bileaflet prolapse.  The patient had a minimally invasive mitral valve repair using a Memo 4D ring size 32 (right chest) by Dr. Horald Chestnut on 11/28/2018.     Since I last saw her she had an echo in December 2023 with normal MV function.  She continues to work as a Comptroller.  When I saw her at that she did have some shortness of breath.  However, the echo was unremarkable and follow-up as mentioned and her EF was OK.    She had knee surgery.  She is working 2 jobs.  She had to get a supplemental job at [employer] and she is on her feet a lot.  She says she does get some chest discomfort.  This is somewhat intense and happens sporadically.  It might last for about a minute.  It does not happen when she is exerting herself.  It comes on at rest.  It is somewhat sharp.  There is no radiation.  It goes away spontaneously.  She does not have associated nausea vomiting or diaphoresis.  Other times she feels well and has no new shortness of breath, PND or orthopnea.  She does take some diuretic which we started last time because she was having a little shortness of breath.  She had a normal BNP at that time.  She takes diuretic sporadically and on days when she is not can be doing a lot of work.  Works in mental health.a  ROS: As stated in the HPI and negative for all other systems.  Studies Reviewed:    EKG:         Risk Assessment/Calculations:              Physical Exam:   VS:  BP 118/80 (BP Location: Left Arm, Patient Position: Sitting, Cuff Size: Large)   Pulse 76   Ht 5\' 3"  (1.6 m)   Wt 189 lb (85.7 kg)    LMP 01/12/2012   SpO2 98%   BMI 33.48 kg/m    Wt Readings from Last 3 Encounters:  05/26/23 189 lb (85.7 kg)  04/10/23 187 lb 12.8 oz (85.2 kg)  01/23/23 193 lb (87.5 kg)     GEN: Well nourished, well developed in no acute distress NECK: No JVD; No carotid bruits CARDIAC: RRR, no murmurs, rubs, gallops RESPIRATORY:  Clear to auscultation without rales, wheezing or rhonchi  ABDOMEN: Soft, non-tender, non-distended EXTREMITIES:  No edema; No deformity   ASSESSMENT AND PLAN:   Mitral valve disease:    This was stable valve function in December 2023.  The mean mitral valve gradient was 5.  There is a 32 mm Sorin Mem 4D ring annuloplasty.  She had normal LV function.  At this at this point I do not think we need further imaging.  Her exam is unremarkable.  She understands endocarditis prophylaxis.   Hypertension:     Her blood pressure is at target.  No change in therapy.    Cardiomyopathy:   Her ejection fraction was well-preserved in December 2023.  Her ejection fraction was well-preserved.  She will continue meds  as listed.  Chest pain: This has nonanginal features.  She had normal coronaries on CT in 2020.  I do not think obstructive coronary disease is the etiology and have suggested she discusses with her primary provider as possible musculoskeletal or GI.    Prolonged QT: This has been prolonged previously but not on today's EKG.  No change in therapy.     Follow up with me in 1 year  Signed, Rollene Rotunda, MD

## 2023-05-26 ENCOUNTER — Ambulatory Visit: Payer: 59 | Attending: Cardiology | Admitting: Cardiology

## 2023-05-26 ENCOUNTER — Encounter: Payer: Self-pay | Admitting: Cardiology

## 2023-05-26 ENCOUNTER — Other Ambulatory Visit (HOSPITAL_BASED_OUTPATIENT_CLINIC_OR_DEPARTMENT_OTHER): Payer: Self-pay

## 2023-05-26 VITALS — BP 118/80 | HR 76 | Ht 63.0 in | Wt 189.0 lb

## 2023-05-26 DIAGNOSIS — I10 Essential (primary) hypertension: Secondary | ICD-10-CM | POA: Diagnosis not present

## 2023-05-26 DIAGNOSIS — I341 Nonrheumatic mitral (valve) prolapse: Secondary | ICD-10-CM | POA: Diagnosis not present

## 2023-05-26 DIAGNOSIS — I420 Dilated cardiomyopathy: Secondary | ICD-10-CM | POA: Diagnosis not present

## 2023-05-26 MED ORDER — AMOXICILLIN 500 MG PO CAPS
2000.0000 mg | ORAL_CAPSULE | Freq: Once | ORAL | 1 refills | Status: DC | PRN
  Filled 2023-05-26: qty 4, 1d supply, fill #0

## 2023-05-26 NOTE — Patient Instructions (Signed)
Medication Instructions:  Your physician recommends that you continue on your current medications as directed. Please refer to the Current Medication list given to you today.  *If you need a refill on your cardiac medications before your next appointment, please call your pharmacy*  Follow-Up: At [facility], you and your health needs are our priority.  As part of our continuing mission to provide you with exceptional heart care, we have created designated Provider Care Teams.  These Care Teams include your primary Cardiologist (physician) and Advanced Practice Providers (APPs -  Physician Assistants and Nurse Practitioners) who all work together to provide you with the care you need, when you need it.  Your next appointment:   12 month(s)  Provider:   Rollene Rotunda, MD    Other Instructions We have sent in your RX for the amoxicillin for your dental cleanings.

## 2023-05-29 ENCOUNTER — Ambulatory Visit: Payer: 59 | Admitting: Internal Medicine

## 2023-06-01 ENCOUNTER — Other Ambulatory Visit: Payer: Self-pay | Admitting: Cardiology

## 2023-06-05 ENCOUNTER — Other Ambulatory Visit: Payer: Self-pay | Admitting: Medical Genetics

## 2023-06-05 ENCOUNTER — Other Ambulatory Visit (HOSPITAL_BASED_OUTPATIENT_CLINIC_OR_DEPARTMENT_OTHER): Payer: Self-pay

## 2023-06-05 MED ORDER — TORSEMIDE 10 MG PO TABS
30.0000 mg | ORAL_TABLET | Freq: Every day | ORAL | 3 refills | Status: DC
  Filled 2023-07-24 – 2023-10-23 (×4): qty 90, 30d supply, fill #0
  Filled 2023-12-26: qty 90, 30d supply, fill #1
  Filled 2024-05-20: qty 90, 30d supply, fill #2

## 2023-06-06 ENCOUNTER — Other Ambulatory Visit (HOSPITAL_BASED_OUTPATIENT_CLINIC_OR_DEPARTMENT_OTHER): Payer: Self-pay

## 2023-06-06 ENCOUNTER — Ambulatory Visit (AMBULATORY_SURGERY_CENTER): Payer: 59

## 2023-06-06 VITALS — Ht 63.0 in | Wt 187.0 lb

## 2023-06-06 DIAGNOSIS — Z1509 Genetic susceptibility to other malignant neoplasm: Secondary | ICD-10-CM

## 2023-06-06 DIAGNOSIS — Z860100 Personal history of colon polyps, unspecified: Secondary | ICD-10-CM

## 2023-06-06 DIAGNOSIS — Z800 Family history of malignant neoplasm of digestive organs: Secondary | ICD-10-CM

## 2023-06-06 MED ORDER — ONDANSETRON HCL 4 MG PO TABS
4.0000 mg | ORAL_TABLET | ORAL | 0 refills | Status: DC
  Filled 2023-06-06 – 2023-06-17 (×2): qty 2, 1d supply, fill #0

## 2023-06-06 MED ORDER — NA SULFATE-K SULFATE-MG SULF 17.5-3.13-1.6 GM/177ML PO SOLN
1.0000 | Freq: Once | ORAL | 0 refills | Status: AC
  Filled 2023-06-06 – 2023-06-17 (×2): qty 354, 1d supply, fill #0

## 2023-06-06 NOTE — Progress Notes (Signed)
No egg or soy allergy known to patient  No issues known to pt with past sedation with any surgeries or procedures Patient denies ever being told they had issues or difficulty with intubation  No FH of Malignant Hyperthermia Pt is not on diet pills Pt is not on  home 02  Pt is not on blood thinners  Pt denies issues with constipation  No A fib or A flutter Have any cardiac testing pending--no  LOA: independent  Prep: suprep   PV competed with patient. Prep instructions sent via mychart and home address.

## 2023-06-16 ENCOUNTER — Ambulatory Visit (HOSPITAL_BASED_OUTPATIENT_CLINIC_OR_DEPARTMENT_OTHER): Payer: 59 | Admitting: Orthopaedic Surgery

## 2023-06-16 ENCOUNTER — Other Ambulatory Visit (HOSPITAL_BASED_OUTPATIENT_CLINIC_OR_DEPARTMENT_OTHER): Payer: Self-pay

## 2023-06-17 ENCOUNTER — Other Ambulatory Visit (HOSPITAL_BASED_OUTPATIENT_CLINIC_OR_DEPARTMENT_OTHER): Payer: Self-pay

## 2023-06-23 ENCOUNTER — Other Ambulatory Visit (HOSPITAL_BASED_OUTPATIENT_CLINIC_OR_DEPARTMENT_OTHER): Payer: Self-pay

## 2023-06-27 ENCOUNTER — Encounter: Payer: Self-pay | Admitting: Gastroenterology

## 2023-06-27 ENCOUNTER — Ambulatory Visit: Payer: 59 | Admitting: Gastroenterology

## 2023-06-27 VITALS — BP 124/88 | HR 68 | Temp 97.5°F | Resp 18 | Ht 63.0 in | Wt 186.0 lb

## 2023-06-27 DIAGNOSIS — K5730 Diverticulosis of large intestine without perforation or abscess without bleeding: Secondary | ICD-10-CM

## 2023-06-27 DIAGNOSIS — K640 First degree hemorrhoids: Secondary | ICD-10-CM

## 2023-06-27 DIAGNOSIS — K635 Polyp of colon: Secondary | ICD-10-CM

## 2023-06-27 DIAGNOSIS — Z860100 Personal history of colon polyps, unspecified: Secondary | ICD-10-CM

## 2023-06-27 DIAGNOSIS — Z1509 Genetic susceptibility to other malignant neoplasm: Secondary | ICD-10-CM

## 2023-06-27 DIAGNOSIS — D124 Benign neoplasm of descending colon: Secondary | ICD-10-CM

## 2023-06-27 MED ORDER — SODIUM CHLORIDE 0.9 % IV SOLN: 500.0000 mL | INTRAVENOUS | Status: DC

## 2023-06-27 NOTE — Progress Notes (Signed)
History & Physical  Primary Care Physician:  Etta Grandchild, MD Primary Gastroenterologist: Claudette Head, MD  Impression / Plan:  Lynch syndrome, personal history of adenomatous and sessile serrated colon polyps for colonoscopy.  CHIEF COMPLAINT: Lynch syndrome, Personal history of colon polyps   HPI: [name] is a 47 y.o. female with lynch syndrome, personal history of adenomatous and sessile serrated colon polyps for colonoscopy.    Past Medical History:  Diagnosis Date   Adenomatous colon polyp 12/2012   Allergic rhinitis    Arthritis    Cellulitis    Common migraine    Diastolic dysfunction    Dysrhythmia    External hemorrhoid    Genital herpes    GERD (gastroesophageal reflux disease)    Hx of migraines    Hyperlipidemia    Hypertension    Lynch syndrome    Mitral regurgitation    MVP (mitral valve prolapse)    Plica syndrome of left knee    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   32 mm Sorin Memo 4D ring annuloplasty via right mini thoracotomy approach    Past Surgical History:  Procedure Laterality Date   ABDOMINAL HYSTERECTOMY     CHOLECYSTECTOMY N/A 01/16/2020   Procedure: LAPAROSCOPIC CHOLECYSTECTOMY WITH INTRAOPERATIVE CHOLANGIOGRAM;  Surgeon: Darnell Level, MD;  Location: [facility];  Service: General;  Laterality: N/A;   COLONOSCOPY     KNEE ARTHROSCOPY WITH MACI CARTILAGE HARVEST Left 10/04/2022   Procedure: LEFT KNEE ARTHROSCOPY WITH MACI CARTILAGE HARVEST;  Surgeon: [name], MD;  Location: [facility];  Service: Orthopedics;  Laterality: Left;   KNEE ARTHROSCOPY WITH MEDIAL MENISECTOMY Left 06/07/2013   Procedure: LEFT KNEE ARTHROSCOPY WITH SYNOVECTOMY LIMITED, ARTHROSCOPY KNEE WITH DEBRIDEMENT/SHAVING (CHONDROPLASTY), ARTHROSCOPY KNEE  ChondroplastyWITH MEDIAL AND LATERAL  MENISECTOMY;  Surgeon: Sheral Apley, MD;  Location: [facility];  Service: Orthopedics;  Laterality: Left;   KNEE  ARTHROSCOPY WITH MEDIAL PATELLAR FEMORAL LIGAMENT RECONSTRUCTION Left 12/20/2022   Procedure: KNEE ARTHROSCOPY WITH MEDIAL PATELLAR FEMORAL LIGAMENT RECONSTRUCTION;  Surgeon: [name], MD;  Location: [facility];  Service: Orthopedics;  Laterality: Left;   left knee arthroscopy     MITRAL VALVE REPAIR Right 11/28/2018   Procedure: MINIMALLY INVASIVE MITRAL VALVE REPAIR (MVR) - Using Memo 4D Ring Size 32;  Surgeon: Purcell Nails, MD;  Location: [facility] OR;  Service: Open Heart Surgery;  Laterality: Right;   patrial hysterectomy     POLYPECTOMY     SALPINGOOPHORECTOMY  2022   SYNOVECTOMY Left 06/07/2013   Procedure: SYNOVECTOMY;  Surgeon: Sheral Apley, MD;  Location: [facility];  Service: Orthopedics;  Laterality: Left;   TEE WITHOUT CARDIOVERSION  10/11/2011   Procedure: TRANSESOPHAGEAL ECHOCARDIOGRAM (TEE);  Surgeon: Wendall Stade, MD;  Location: [facility];  Service: Cardiovascular;  Laterality: N/A;   TEE WITHOUT CARDIOVERSION N/A 11/28/2018   Procedure: TRANSESOPHAGEAL ECHOCARDIOGRAM (TEE);  Surgeon: Purcell Nails, MD;  Location: [facility] OR;  Service: Open Heart Surgery;  Laterality: N/A;   TUBAL LIGATION     WISDOM TOOTH EXTRACTION      Prior to Admission medications   Medication Sig Start Date End Date Taking? Authorizing Provider  estradiol (VIVELLE-DOT) 0.1 MG/24HR patch Place 1 patch (0.1 mg total) onto the skin 2 (two) times a week. 04/04/22  Yes   torsemide (DEMADEX) 10 MG tablet Take 3 tablets (30 mg total) by mouth daily. 06/05/23  Yes Rollene Rotunda, MD  traMADol Janean Sark) 50  MG tablet Take 1 tablet (50 mg total) by mouth every 6 (six) hours as needed. 05/24/23  Yes [name], MD  aspirin EC 325 MG tablet Take 1 tablet (325 mg total) by mouth daily. Patient not taking: Reported on 06/27/2023 12/20/22   [name], MD  estradiol (VIVELLE-DOT) 0.1 MG/24HR patch Place 1 patch (0.1 mg total) onto the skin 2 (two) times a week. 12/26/22      meloxicam (MOBIC) 15 MG tablet Take 1 tablet (15 mg total) by mouth daily. Patient not taking: Reported on 06/27/2023 10/20/22   [name], MD  ondansetron (ZOFRAN) 4 MG tablet Take 1 tablet (4 mg total) by mouth as directed. Take 1 tablet 45 min - 1 hour prior to drinking each dose of your prep Patient not taking: Reported on 06/27/2023 06/06/23   Meryl Dare, MD  Potassium Chloride ER 20 MEQ TBCR Take 20 mEq by mouth 2 (two) times daily. 05/31/21 12/20/22  Rollene Rotunda, MD  Ubrogepant (UBRELVY) 100 MG TABS Take 1 tablet (100 mg total) by mouth daily as needed. Patient not taking: Reported on 06/27/2023 10/27/22   Etta Grandchild, MD  valACYclovir (VALTREX) 500 MG tablet Take 500 mg by mouth as needed. 06/18/21   [provider]    Current Outpatient Medications  Medication Sig Dispense Refill   estradiol (VIVELLE-DOT) 0.1 MG/24HR patch Place 1 patch (0.1 mg total) onto the skin 2 (two) times a week. 8 patch 6   torsemide (DEMADEX) 10 MG tablet Take 3 tablets (30 mg total) by mouth daily. 270 tablet 3   traMADol (ULTRAM) 50 MG tablet Take 1 tablet (50 mg total) by mouth every 6 (six) hours as needed. 30 tablet 1   aspirin EC 325 MG tablet Take 1 tablet (325 mg total) by mouth daily. (Patient not taking: Reported on 06/27/2023) 30 tablet 0   estradiol (VIVELLE-DOT) 0.1 MG/24HR patch Place 1 patch (0.1 mg total) onto the skin 2 (two) times a week. 24 patch 3   meloxicam (MOBIC) 15 MG tablet Take 1 tablet (15 mg total) by mouth daily. (Patient not taking: Reported on 06/27/2023) 7 tablet 0   ondansetron (ZOFRAN) 4 MG tablet Take 1 tablet (4 mg total) by mouth as directed. Take 1 tablet 45 min - 1 hour prior to drinking each dose of your prep (Patient not taking: Reported on 06/27/2023) 2 tablet 0   Potassium Chloride ER 20 MEQ TBCR Take 20 mEq by mouth 2 (two) times daily. 180 tablet 0   Ubrogepant (UBRELVY) 100 MG TABS Take 1 tablet (100 mg total) by mouth daily as needed.  (Patient not taking: Reported on 06/27/2023) 30 tablet 1   valACYclovir (VALTREX) 500 MG tablet Take 500 mg by mouth as needed.     Current Facility-Administered Medications  Medication Dose Route Frequency Provider Last Rate Last Admin   0.9 %  sodium chloride infusion  500 mL Intravenous Continuous Meryl Dare, MD        Allergies as of 06/27/2023 - Review Complete 06/27/2023  Allergen Reaction Noted   Bee venom Swelling and Rash 06/01/2020    Family History  Problem Relation Age of Onset   Other Mother        lynch syndrome   Colon cancer Father 21   Colon polyps Maternal Uncle    Prostate cancer Maternal Uncle 64   Colon cancer Paternal Aunt 30   Colon cancer Paternal Uncle 26   Colon cancer Paternal Uncle 21  Multiple myeloma Maternal Grandmother 32   Parkinsonism Paternal Grandfather    Other Daughter        Lynch syndrome   Breast cancer Cousin        lynch syndrome    Breast cancer Other        Paternal Great Grandmother   Esophageal cancer Neg Hx    Rectal cancer Neg Hx    Stomach cancer Neg Hx     Social History   Socioeconomic History   Marital status: Married    Spouse name: Barbara Cower   Number of children: 2   Years of education: Not on file   Highest education level: Master's degree (e.g., MA, MS, MEng, MEd, MSW, MBA)  Occupational History   Occupation: patient account rep    Employer: [employer]  Tobacco Use   Smoking status: Some Days    Current packs/day: 0.00    Average packs/day: 0.5 packs/day for 12.0 years (6.0 ttl pk-yrs)    Types: Cigars, Cigarettes    Start date: 11/29/1998    Last attempt to quit: 11/29/2010    Years since quitting: 12.5   Smokeless tobacco: Never  Vaping Use   Vaping status: Never Used  Substance and Sexual Activity   Alcohol use: Yes    Alcohol/week: 2.0 standard drinks of alcohol    Types: 2 Glasses of wine per week    Comment: social   Drug use: No   Sexual activity: Yes    Birth control/protection:  Surgical  Other Topics Concern   Not on file  Social History Narrative   Not on file   Social Drivers of Health   Financial Resource Strain: Low Risk  (01/23/2023)   Overall Financial Resource Strain (CARDIA)    Difficulty of Paying Living Expenses: Not very hard  Food Insecurity: No Food Insecurity (01/23/2023)   Hunger Vital Sign    Worried About Running Out of Food in the Last Year: Never true    Ran Out of Food in the Last Year: Never true  Transportation Needs: No Transportation Needs (01/23/2023)   PRAPARE - Administrator, Civil Service (Medical): No    Lack of Transportation (Non-Medical): No  Physical Activity: Unknown (01/23/2023)   Exercise Vital Sign    Days of Exercise per Week: 0 days    Minutes of Exercise per Session: Not on file  Stress: No Stress Concern Present (01/23/2023)   [employer] of Occupational Health - Occupational Stress Questionnaire    Feeling of Stress : Not at all  Social Connections: Socially Integrated (01/23/2023)   Social Connection and Isolation Panel [NHANES]    Frequency of Communication with Friends and Family: More than three times a week    Frequency of Social Gatherings with Friends and Family: More than three times a week    Attends Religious Services: More than 4 times per year    Active Member of [employer] or Organizations: Yes    Attends Engineer, structural: More than 4 times per year    Marital Status: Married  Catering manager Violence: Not on file    Review of Systems:  All systems reviewed were negative except where noted in HPI.   Physical Exam:  General:  Alert, well-developed, in NAD Head:  Normocephalic and atraumatic. Eyes:  Sclera clear, no icterus.   Conjunctiva pink. Ears:  Normal auditory acuity. Mouth:  No deformity or lesions.  Neck:  Supple; no masses. Lungs:  Clear throughout to auscultation.  No wheezes, crackles, or rhonchi.  Heart:  Regular rate and rhythm; no murmurs. Abdomen:   Soft, nondistended, nontender. No masses, hepatomegaly. No palpable masses.  Normal bowel sounds.    Rectal:  Deferred   Msk:  Symmetrical without gross deformities. Extremities:  Without edema. Neurologic:  Alert and  oriented x 4; grossly normal neurologically. Skin:  Intact without significant lesions or rashes. Psych:  Alert and cooperative. Normal mood and affect.   Venita Lick. Russella Dar  06/27/2023, 8:04 AM See Loretha Stapler, [facility], to contact our on call provider

## 2023-06-27 NOTE — Progress Notes (Signed)
Patient states there have been no changes to medical or surgical history since time of pre-visit. 

## 2023-06-27 NOTE — Progress Notes (Signed)
Sedate, gd SR, tolerated procedure well, VSS, report to RN 

## 2023-06-27 NOTE — Patient Instructions (Signed)
 Educational handout provided to patient related to Hemorrhoids, Polyps, and Diverticulosis  Resume previous diet  Continue present medications  Awaiting pathology results   YOU HAD AN ENDOSCOPIC PROCEDURE TODAY AT THE [facility]:   Refer to the procedure report that was given to you for any specific questions about what was found during the examination.  If the procedure report does not answer your questions, please call your gastroenterologist to clarify.  If you requested that your care partner not be given the details of your procedure findings, then the procedure report has been included in a sealed envelope for you to review at your convenience later.  YOU SHOULD EXPECT: Some feelings of bloating in the abdomen. Passage of more gas than usual.  Walking can help get rid of the air that was put into your GI tract during the procedure and reduce the bloating. If you had a lower endoscopy (such as a colonoscopy or flexible sigmoidoscopy) you may notice spotting of blood in your stool or on the toilet paper. If you underwent a bowel prep for your procedure, you may not have a normal bowel movement for a few days.  Please Note:  You might notice some irritation and congestion in your nose or some drainage.  This is from the oxygen used during your procedure.  There is no need for concern and it should clear up in a day or so.  SYMPTOMS TO REPORT IMMEDIATELY:  Following lower endoscopy (colonoscopy or flexible sigmoidoscopy):  Excessive amounts of blood in the stool  Significant tenderness or worsening of abdominal pains  Swelling of the abdomen that is new, acute  Fever of 100F or higher   For urgent or emergent issues, a gastroenterologist can be reached at any hour by calling [phone]. Do not use MyChart messaging for urgent concerns.    DIET:  We do recommend a small meal at first, but then you may proceed to your regular diet.  Drink plenty of fluids but you should  avoid alcoholic beverages for 24 hours.  ACTIVITY:  You should plan to take it easy for the rest of today and you should NOT DRIVE or use heavy machinery until tomorrow (because of the sedation medicines used during the test).    FOLLOW UP: Our staff will call the number listed on your records the next business day following your procedure.  We will call around 7:15- 8:00 am to check on you and address any questions or concerns that you may have regarding the information given to you following your procedure. If we do not reach you, we will leave a message.     If any biopsies were taken you will be contacted by phone or by letter within the next 1-3 weeks.  Please call us at [phone] if you have not heard about the biopsies in 3 weeks.    SIGNATURES/CONFIDENTIALITY: You and/or your care partner have signed paperwork which will be entered into your electronic medical record.  These signatures attest to the fact that that the information above on your After Visit Summary has been reviewed and is understood.  Full responsibility of the confidentiality of this discharge information lies with you and/or your care-partner.

## 2023-06-27 NOTE — Progress Notes (Signed)
Called to room to assist during endoscopic procedure.  Patient ID and intended procedure confirmed with present staff. Received instructions for my participation in the procedure from the performing physician.  

## 2023-06-27 NOTE — Op Note (Signed)
[facility] Patient Name: [name] Procedure Date: 06/27/2023 7:53 AM MRN: [id number] Endoscopist: Meryl Dare , MD, [phone] Age: 47 Referring MD:  Date of Birth: 05/06/1976 Gender: Female Account #: [id number] Procedure:                Colonoscopy Indications:              Lynch Syndrome, Personal history of adenomatous and                            sessile serrated colon polyps Medicines:                Monitored Anesthesia Care Procedure:                Pre-Anesthesia Assessment:                           - Prior to the procedure, a History and Physical                            was performed, and patient medications and                            allergies were reviewed. The patient's tolerance of                            previous anesthesia was also reviewed. The risks                            and benefits of the procedure and the sedation                            options and risks were discussed with the patient.                            All questions were answered, and informed consent                            was obtained. Prior Anticoagulants: The patient has                            taken no anticoagulant or antiplatelet agents. ASA                            Grade Assessment: II - A patient with mild systemic                            disease. After reviewing the risks and benefits,                            the patient was deemed in satisfactory condition to                            undergo the procedure.  After obtaining informed consent, the colonoscope                            was passed under direct vision. Throughout the                            procedure, the patient's blood pressure, pulse, and                            oxygen saturations were monitored continuously. The                            Olympus CF-HQ190L ([id number]) Colonoscope was                            introduced through the  anus and advanced to the the                            cecum, identified by appendiceal orifice and                            ileocecal valve. The ileocecal valve, appendiceal                            orifice, and rectum were photographed. The quality                            of the bowel preparation was good. The colonoscopy                            was performed without difficulty. The patient                            tolerated the procedure well. Scope In: 8:12:10 AM Scope Out: 8:25:50 AM Scope Withdrawal Time: 0 hours 10 minutes 12 seconds  Total Procedure Duration: 0 hours 13 minutes 40 seconds  Findings:                 The perianal and digital rectal examinations were                            normal.                           A 7 mm polyp was found in the descending colon. The                            polyp was sessile. The polyp was removed with a                            cold snare. Resection and retrieval were complete.                           Scattered medium-mouthed and small-mouthed  diverticula were found in the sigmoid colon,                            descending colon, transverse colon and ascending                            colon. There was no evidence of diverticular                            bleeding.                           Internal hemorrhoids were found during                            retroflexion. The hemorrhoids were small and Grade                            I (internal hemorrhoids that do not prolapse).                           The exam was otherwise without abnormality on                            direct and retroflexion views. Complications:            No immediate complications. Estimated blood loss:                            None. Estimated Blood Loss:     Estimated blood loss: none. Impression:               - One 7 mm polyp in the descending colon, removed                            with a cold snare.  Resected and retrieved.                           - Mild diverticulosis in the sigmoid colon, in the                            descending colon, in the transverse colon and in                            the ascending colon.                           - Internal hemorrhoids. Recommendation:           - Repeat colonoscopy in 1 year for surveillance                            with Dr. Doy Hutching.                           - Patient has a contact number available for  emergencies. The signs and symptoms of potential                            delayed complications were discussed with the                            patient. Return to normal activities tomorrow.                            Written discharge instructions were provided to the                            patient.                           - Resume previous diet.                           - Continue present medications.                           - Await pathology results. Meryl Dare, MD 06/27/2023 8:29:39 AM This report has been signed electronically.

## 2023-06-29 ENCOUNTER — Other Ambulatory Visit (HOSPITAL_COMMUNITY)
Admission: RE | Admit: 2023-06-29 | Discharge: 2023-06-29 | Disposition: A | Discharge status: Home / Self Care | Payer: Self-pay | Source: Ambulatory Visit | Attending: Medical Genetics | Admitting: Medical Genetics

## 2023-06-30 ENCOUNTER — Telehealth: Payer: Self-pay

## 2023-06-30 NOTE — Telephone Encounter (Signed)
Follow up call to pt, lm for pt to call if having any difficulty with normal activities or eating and drinking.  Also to call if any other questions or concerns.  

## 2023-07-03 LAB — SURGICAL PATHOLOGY

## 2023-07-04 ENCOUNTER — Encounter: Payer: Self-pay | Admitting: Gastroenterology

## 2023-07-20 ENCOUNTER — Telehealth: Payer: Self-pay | Admitting: *Deleted

## 2023-07-20 ENCOUNTER — Encounter: Payer: Self-pay | Admitting: Cardiology

## 2023-07-20 ENCOUNTER — Encounter: Payer: Self-pay | Admitting: Internal Medicine

## 2023-07-20 NOTE — Telephone Encounter (Signed)
Spoke to pt about her MyChart message c/o chest pains that are becoming more frequent, and now accompanied by back pain.  Pt states this is the same CP that she has had in the past, and the reason for her OV on 05/26/2023 with Dr. Antoine Poche.  Per Dr. Jenene Slicker note, Normal coronaries on CT in 2020; she was to f/u with her PCP as this may be musculoskeletal or GI related.  Pt states, "Oh my Del[facility] Rend!  I forgot that he had said that!  I did not f/u with them.  I will send them a message right now."    ER precautions reviewed with pt who verbalized understanding.  Encouraged her to reach out to [place] again if needed and to keep [place] updated after seeing her PCP.  Will route to Dr. Yetta Barre (PCP).

## 2023-07-24 ENCOUNTER — Other Ambulatory Visit (HOSPITAL_BASED_OUTPATIENT_CLINIC_OR_DEPARTMENT_OTHER): Payer: Self-pay

## 2023-07-25 ENCOUNTER — Encounter: Payer: Self-pay | Admitting: Cardiology

## 2023-07-25 DIAGNOSIS — I10 Essential (primary) hypertension: Secondary | ICD-10-CM

## 2023-07-25 DIAGNOSIS — R7989 Other specified abnormal findings of blood chemistry: Secondary | ICD-10-CM

## 2023-07-25 NOTE — Telephone Encounter (Signed)
Patient id requesting a referral to nephrology. Patient report being on Lasix and last kidney function was slightly off. Please advise.

## 2023-07-26 LAB — HM MAMMOGRAPHY: HM Mammogram: NORMAL (ref 0–4)

## 2023-07-27 ENCOUNTER — Encounter: Payer: Self-pay | Admitting: Internal Medicine

## 2023-07-28 ENCOUNTER — Encounter: Payer: Self-pay | Admitting: Cardiology

## 2023-07-28 NOTE — Telephone Encounter (Signed)
Spoke with patient. Pt verified name and DOB. Pt stated her blood pressure was high this morning 149/93, she says it usually ranges in the 110/80s. Pt does not keep a log of BP readings. Pt stated her headaches were faint but constant and she believes it's related to dryness in her nose and sinus pressure. Pt stated her chest pain is similar to the pain she felt in the past that Dr. Antoine Poche did not believe to be cardiac related. Pt also mentioned her nose bleed, which I mentioned could happen for many reasons, including dryness. Pt is aware a referral was made to nephrology. Pt stated she has been taking her torsemide as prescribed. Pt has an appt with PCP on 08/15/2023. I advised pt to keep a log of daily blood pressure readings twice daily and send back to [place]. I recommended pt to go to ER for evaluation if chest pain becomes constant and symptoms of HA, dizziness, and shortness of breath arise. I notified pt I would fwd message to Dr. Antoine Poche to advise. Pt verbalized understanding.   Josie LPN

## 2023-07-31 ENCOUNTER — Other Ambulatory Visit: Payer: Self-pay | Admitting: Family

## 2023-07-31 ENCOUNTER — Other Ambulatory Visit: Payer: Self-pay

## 2023-07-31 DIAGNOSIS — Z79899 Other long term (current) drug therapy: Secondary | ICD-10-CM

## 2023-07-31 DIAGNOSIS — I10 Essential (primary) hypertension: Secondary | ICD-10-CM

## 2023-07-31 MED ORDER — WEGOVY 0.25 MG/0.5ML ~~LOC~~ SOAJ
0.2500 mg | SUBCUTANEOUS | 1 refills | Status: DC
  Filled 2023-07-31 – 2023-08-17 (×3): qty 2, 28d supply, fill #0
  Filled 2023-09-14 – 2024-01-23 (×3): qty 2, 28d supply, fill #1

## 2023-08-01 ENCOUNTER — Other Ambulatory Visit (HOSPITAL_BASED_OUTPATIENT_CLINIC_OR_DEPARTMENT_OTHER): Payer: Self-pay

## 2023-08-01 ENCOUNTER — Other Ambulatory Visit: Payer: Self-pay

## 2023-08-01 DIAGNOSIS — I10 Essential (primary) hypertension: Secondary | ICD-10-CM

## 2023-08-03 ENCOUNTER — Other Ambulatory Visit (HOSPITAL_BASED_OUTPATIENT_CLINIC_OR_DEPARTMENT_OTHER): Payer: Self-pay

## 2023-08-04 ENCOUNTER — Other Ambulatory Visit (HOSPITAL_BASED_OUTPATIENT_CLINIC_OR_DEPARTMENT_OTHER): Payer: Self-pay

## 2023-08-07 ENCOUNTER — Other Ambulatory Visit (HOSPITAL_BASED_OUTPATIENT_CLINIC_OR_DEPARTMENT_OTHER): Payer: Self-pay

## 2023-08-15 ENCOUNTER — Ambulatory Visit: Payer: 59 | Admitting: Internal Medicine

## 2023-08-16 ENCOUNTER — Other Ambulatory Visit (HOSPITAL_BASED_OUTPATIENT_CLINIC_OR_DEPARTMENT_OTHER): Payer: Self-pay

## 2023-08-17 ENCOUNTER — Other Ambulatory Visit (HOSPITAL_BASED_OUTPATIENT_CLINIC_OR_DEPARTMENT_OTHER): Payer: Self-pay

## 2023-08-24 ENCOUNTER — Other Ambulatory Visit (HOSPITAL_BASED_OUTPATIENT_CLINIC_OR_DEPARTMENT_OTHER): Payer: Self-pay

## 2023-09-12 ENCOUNTER — Other Ambulatory Visit (HOSPITAL_COMMUNITY): Payer: Self-pay

## 2023-09-12 ENCOUNTER — Other Ambulatory Visit (HOSPITAL_BASED_OUTPATIENT_CLINIC_OR_DEPARTMENT_OTHER): Payer: Self-pay

## 2023-09-12 ENCOUNTER — Ambulatory Visit: Payer: 59 | Attending: Cardiovascular Disease | Admitting: Pharmacist

## 2023-09-12 ENCOUNTER — Encounter: Payer: Self-pay | Admitting: Pharmacist

## 2023-09-12 VITALS — BP 116/78 | HR 103

## 2023-09-12 DIAGNOSIS — I5032 Chronic diastolic (congestive) heart failure: Secondary | ICD-10-CM | POA: Diagnosis not present

## 2023-09-12 DIAGNOSIS — I10 Essential (primary) hypertension: Secondary | ICD-10-CM | POA: Diagnosis not present

## 2023-09-12 MED ORDER — ENTRESTO 24-26 MG PO TABS
1.0000 | ORAL_TABLET | Freq: Two times a day (BID) | ORAL | 1 refills | Status: DC
  Filled 2023-09-12: qty 60, 30d supply, fill #0
  Filled 2023-10-06 – 2023-10-17 (×2): qty 60, 30d supply, fill #1

## 2023-09-12 NOTE — Progress Notes (Signed)
 Patient ID: [name]                 DOB: [birth year]                      MRN: [id number]     HPI: [name] is a 48 y.o. female referred by Dr. Antoine Poche to HTN clinic. PMH is significant for MR, mitral valve prolapse, HTN, and CHF.   Last seen by Dr Antoine Poche on 05/26/23 and BP was controlled at 118/80. Remained controlled until recently.   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  Checks blood pressure at work but forgot her log today. Thinks her last reading was 143/90.  Not currently on any antihypertensives other than torsemide. Previously on metoprolol and losartan but both were discontinued due to hypotension. Patient does not remember being symptomatic.  Torsemide dose is 30mg  daily. She reports she averages taking it about every other day. Does not like to take it before work since she needs to urinate frequently. Does not like to take when she gets home from work because she will be up many times during the night.  Echo in 2020 showed EF of 40-45%. Improved to 60-65% in 2023 but has not had one performed since. Continues to have swelling.  Waiting on referral from nephrology.  Current HTN meds:  Torsemide 30mg  daily  Previously tried:  Metoprolol 50mg   Losartan 50mg   BP goal: <130/80  Wt Readings from Last 3 Encounters:  06/27/23 186 lb (84.4 kg)  06/06/23 187 lb (84.8 kg)  05/26/23 189 lb (85.7 kg)   BP Readings from Last 3 Encounters:  06/27/23 124/88  05/26/23 118/80  05/04/23 122/85   Pulse Readings from Last 3 Encounters:  06/27/23 68  05/26/23 76  05/04/23 73    Renal function: CrCl cannot be calculated (Patient's most recent lab result is older than the maximum 21 days allowed.).  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 (Patient not taking: Reported on 06/27/2023) 30 tablet 0   estradiol (VIVELLE-DOT) 0.1 MG/24HR patch Place 1 patch (0.1 mg total) onto the skin 2 (two) times a week. 24 patch 3   estradiol (VIVELLE-DOT) 0.1 MG/24HR patch Place 1 patch (0.1 mg total) onto the skin 2 (two) times a week. 8 patch 6   meloxicam (MOBIC) 15 MG tablet Take 1 tablet (15 mg total) by mouth daily. (Patient not taking: Reported on 06/27/2023) 7 tablet 0   ondansetron (ZOFRAN) 4 MG tablet Take 1 tablet (4 mg total) by mouth as directed. Take 1 tablet 45 min -  1 hour prior to drinking each dose of your prep (Patient not taking: Reported on 06/27/2023) 2 tablet 0   Potassium Chloride ER 20 MEQ TBCR Take 20 mEq by mouth 2 (two) times daily. 180 tablet 0   Semaglutide-Weight Management (WEGOVY) 0.25 MG/0.5ML SOAJ Inject 0.25 mg into the skin once a week. 2 mL 1   torsemide (DEMADEX) 10 MG tablet Take 3 tablets (30 mg total) by mouth daily. 270 tablet 3   traMADol (ULTRAM) 50 MG tablet Take 1 tablet (50 mg total) by mouth every 6 (six) hours as needed. 30 tablet 1   Ubrogepant (UBRELVY) 100 MG TABS Take 1 tablet (100 mg total) by mouth daily as needed. (Patient not taking: Reported on 06/27/2023) 30 tablet 1   valACYclovir (VALTREX) 500 MG tablet Take 500 mg by mouth as needed.     No current facility-administered medications on file prior to visit.    Allergies   Allergen Reactions   Bee Venom Swelling and Rash     Assessment/Plan:  1. Hypertension -  Patient BP in room 116/78 which is at goal of <130/80. Patient does not like taking torsemide due to frequent urination and worry regarding her renal health.  Discussed medications that could be tried that reduce blood pressure as well as provide diuresis such as Entresto, SGLT2i, hydrochlorothiazide, or spiro. Since she tolerated losartan well in the past will start low dose entresto and recheck BMP in 1-2 weeks. Advised patient to switch torsemide to PRN. Recheck in clinic in 4 weeks  Start Entresto 24-26mg  BID Recheck BMP in ~2 weeks  Laural Golden, PharmD, BCACP, CDCES, CPP [address] [place], [place], [address] Phone: [phone], Fax: [phone]

## 2023-09-12 NOTE — Patient Instructions (Addendum)
 It was nice meeting you today  We would like to keep your blood pressure less than 130/80  Change your torsemide to as needed with as few pills as needed  We will start Entresto 24-26mg  twice a day which also may help decrease your fluid  Continue to check your blood pressures and weight at work  I will see you back in a few weeks  Laural Golden, PharmD, BCACP, CDCES, CPP [address] [place], [place], [address] Phone: [phone], Fax: [phone]

## 2023-09-14 ENCOUNTER — Ambulatory Visit: Payer: 59 | Admitting: Internal Medicine

## 2023-09-14 ENCOUNTER — Other Ambulatory Visit: Payer: Self-pay

## 2023-09-15 ENCOUNTER — Other Ambulatory Visit (HOSPITAL_BASED_OUTPATIENT_CLINIC_OR_DEPARTMENT_OTHER): Payer: Self-pay

## 2023-09-20 ENCOUNTER — Other Ambulatory Visit: Payer: Self-pay

## 2023-09-20 LAB — GENECONNECT MOLECULAR SCREEN: Genetic Analysis Overall Interpretation: POSITIVE — AB

## 2023-09-21 ENCOUNTER — Telehealth: Payer: Self-pay

## 2023-09-21 ENCOUNTER — Other Ambulatory Visit (HOSPITAL_COMMUNITY): Payer: Self-pay

## 2023-09-21 NOTE — Telephone Encounter (Signed)
 Patient has been made aware.

## 2023-09-21 NOTE — Telephone Encounter (Signed)
 Pharmacy Patient Advocate Encounter   Received notification from Onbase that prior authorization for [facility] 0.25MG /0.5ML auto-injectors is required/requested.   Insurance verification completed.   The patient is insured through [facility] .   Per test claim: PA required and submitted KEY/EOC/Request #: [id number] APPROVED from 09/21/2023 to 04/18/2024

## 2023-09-22 ENCOUNTER — Telehealth: Payer: Self-pay | Admitting: Medical Genetics

## 2023-09-22 DIAGNOSIS — Z1589 Genetic susceptibility to other disease: Secondary | ICD-10-CM | POA: Insufficient documentation

## 2023-09-22 NOTE — Telephone Encounter (Signed)
 [facility] Positive Result Note 09/22/2023 11:47 AM  FIRST ATTEMPT: Left a voicemail message for participant to call me back.   09/26/2023 1:52 PM  SECOND ATTEMPT: Left a voicemail message for participant to call me back.  [facility] Positive Result Note 09/27/2023 4:43 PM  THIRD ATTEMPT: Confirmed I was speaking with [name] [phone] by using name and DOB. Informed participant the reason for this call is to provide results for the above study. Results revealed Lynch Syndrome. Genetic counseling was offered and participant declined as she had known about this result previously and had genetic counseling at the time that diagnosis was made. All questions were answered, and participant was thanked for their time and support of the above study. Participant was encouraged to contact [facility] if they have any further questions or concerns.

## 2023-10-04 ENCOUNTER — Other Ambulatory Visit (HOSPITAL_BASED_OUTPATIENT_CLINIC_OR_DEPARTMENT_OTHER): Payer: Self-pay

## 2023-10-04 ENCOUNTER — Ambulatory Visit: Admitting: Internal Medicine

## 2023-10-04 LAB — LAB REPORT - SCANNED
Albumin, Urine POC: 5.4
Albumin/Creatinine Ratio, Urine, POC: 4
Creatinine, POC: 149.8 mg/dL
EGFR: 67

## 2023-10-05 ENCOUNTER — Other Ambulatory Visit: Payer: Self-pay | Admitting: Nephrology

## 2023-10-05 DIAGNOSIS — R7989 Other specified abnormal findings of blood chemistry: Secondary | ICD-10-CM

## 2023-10-09 ENCOUNTER — Ambulatory Visit
Admission: RE | Admit: 2023-10-09 | Discharge: 2023-10-09 | Disposition: A | Discharge status: Home / Self Care | Source: Ambulatory Visit | Attending: Nephrology

## 2023-10-09 DIAGNOSIS — R7989 Other specified abnormal findings of blood chemistry: Secondary | ICD-10-CM

## 2023-10-16 ENCOUNTER — Other Ambulatory Visit (HOSPITAL_BASED_OUTPATIENT_CLINIC_OR_DEPARTMENT_OTHER): Payer: Self-pay

## 2023-10-20 ENCOUNTER — Other Ambulatory Visit (HOSPITAL_BASED_OUTPATIENT_CLINIC_OR_DEPARTMENT_OTHER): Payer: Self-pay

## 2023-10-20 MED ORDER — ERGOCALCIFEROL 1.25 MG (50000 UT) PO CAPS
50000.0000 [IU] | ORAL_CAPSULE | ORAL | 3 refills | Status: DC
  Filled 2023-10-20 – 2023-11-17 (×4): qty 4, 28d supply, fill #0

## 2023-10-21 ENCOUNTER — Other Ambulatory Visit (HOSPITAL_BASED_OUTPATIENT_CLINIC_OR_DEPARTMENT_OTHER): Payer: Self-pay

## 2023-10-22 ENCOUNTER — Other Ambulatory Visit: Payer: Self-pay

## 2023-10-23 ENCOUNTER — Other Ambulatory Visit (HOSPITAL_BASED_OUTPATIENT_CLINIC_OR_DEPARTMENT_OTHER): Payer: Self-pay

## 2023-10-23 ENCOUNTER — Other Ambulatory Visit: Payer: Self-pay

## 2023-10-23 MED ORDER — ESTRADIOL 0.1 MG/24HR TD PTTW
1.0000 | MEDICATED_PATCH | TRANSDERMAL | 0 refills | Status: AC
  Filled 2023-10-23 – 2024-08-05 (×4): qty 8, 28d supply, fill #0

## 2023-10-24 ENCOUNTER — Other Ambulatory Visit (HOSPITAL_BASED_OUTPATIENT_CLINIC_OR_DEPARTMENT_OTHER): Payer: Self-pay

## 2023-10-25 ENCOUNTER — Other Ambulatory Visit (HOSPITAL_BASED_OUTPATIENT_CLINIC_OR_DEPARTMENT_OTHER): Payer: Self-pay

## 2023-11-04 ENCOUNTER — Other Ambulatory Visit (HOSPITAL_BASED_OUTPATIENT_CLINIC_OR_DEPARTMENT_OTHER): Payer: Self-pay

## 2023-11-06 ENCOUNTER — Other Ambulatory Visit (HOSPITAL_BASED_OUTPATIENT_CLINIC_OR_DEPARTMENT_OTHER): Payer: Self-pay

## 2023-11-07 ENCOUNTER — Ambulatory Visit: Attending: Pharmacist | Admitting: Pharmacist

## 2023-11-07 NOTE — Progress Notes (Deleted)
 Patient ID: [name]                 DOB: [birth year]                      MRN: [id number]     HPI: [name] is a 48 y.o. female referred by Dr. Antoine Poche to HTN clinic. PMH is significant for MR, mitral valve prolapse, HTN, and CHF.   Last seen by Dr Antoine Poche on 05/26/23 and BP was controlled at 118/80. Remained controlled until recently.   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  Checks blood pressure at work but forgot her log today. Thinks her last reading was 143/90.  Not currently on any antihypertensives other than torsemide. Previously on metoprolol and losartan but both were discontinued due to hypotension. Patient does not remember being symptomatic.  Torsemide dose is 30mg  daily. She reports she averages taking it about every other day. Does not like to take it before work since she needs to urinate frequently. Does not like to take when she gets home from work because she will be up many times during the night.  Echo in 2020 showed EF of 40-45%. Improved to 60-65% in 2023 but has not had one performed since. Continues to have swelling.  Waiting on referral from nephrology.  Current HTN meds:  Torsemide 30mg  daily  Previously tried:  Metoprolol 50mg   Losartan 50mg   BP goal: <130/80  Wt Readings from Last 3 Encounters:  06/27/23 186 lb (84.4 kg)  06/06/23 187 lb (84.8 kg)  05/26/23 189 lb (85.7 kg)   BP Readings from Last 3 Encounters:  06/27/23 124/88  05/26/23 118/80  05/04/23 122/85   Pulse Readings from Last 3 Encounters:  06/27/23 68  05/26/23 76  05/04/23 73    Renal function: CrCl cannot be calculated (Patient's most recent lab result is older than the maximum 21 days allowed.).  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 (Patient not taking: Reported on 06/27/2023) 30 tablet 0   estradiol (VIVELLE-DOT) 0.1 MG/24HR patch Place 1 patch (0.1 mg total) onto the skin 2 (two) times a week. 24 patch 3   estradiol (VIVELLE-DOT) 0.1 MG/24HR patch Place 1 patch (0.1 mg total) onto the skin 2 (two) times a week. 8 patch 6   meloxicam (MOBIC) 15 MG tablet Take 1 tablet (15 mg total) by mouth daily. (Patient not taking: Reported on 06/27/2023) 7 tablet 0   ondansetron (ZOFRAN) 4 MG tablet Take 1 tablet (4 mg total) by mouth as directed. Take 1 tablet 45 min -  1 hour prior to drinking each dose of your prep (Patient not taking: Reported on 06/27/2023) 2 tablet 0   Potassium Chloride ER 20 MEQ TBCR Take 20 mEq by mouth 2 (two) times daily. 180 tablet 0   Semaglutide-Weight Management (WEGOVY) 0.25 MG/0.5ML SOAJ Inject 0.25 mg into the skin once a week. 2 mL 1   torsemide (DEMADEX) 10 MG tablet Take 3 tablets (30 mg total) by mouth daily. 270 tablet 3   traMADol (ULTRAM) 50 MG tablet Take 1 tablet (50 mg total) by mouth every 6 (six) hours as needed. 30 tablet 1   Ubrogepant (UBRELVY) 100 MG TABS Take 1 tablet (100 mg total) by mouth daily as needed. (Patient not taking: Reported on 06/27/2023) 30 tablet 1   valACYclovir (VALTREX) 500 MG tablet Take 500 mg by mouth as needed.     No current facility-administered medications on file prior to visit.    Allergies   Allergen Reactions   Bee Venom Swelling and Rash     Assessment/Plan:  1. Hypertension -  Patient BP in room 116/78 which is at goal of <130/80. Patient does not like taking torsemide due to frequent urination and worry regarding her renal health.  Discussed medications that could be tried that reduce blood pressure as well as provide diuresis such as Entresto, SGLT2i, hydrochlorothiazide, or spiro. Since she tolerated losartan well in the past will start low dose entresto and recheck BMP in 1-2 weeks. Advised patient to switch torsemide to PRN. Recheck in clinic in 4 weeks  Start Entresto 24-26mg  BID Recheck BMP in ~2 weeks  Laural Golden, PharmD, BCACP, CDCES, CPP [address] [place], [place], [address] Phone: [phone], Fax: [phone]

## 2023-11-16 ENCOUNTER — Other Ambulatory Visit (HOSPITAL_BASED_OUTPATIENT_CLINIC_OR_DEPARTMENT_OTHER): Payer: Self-pay

## 2023-11-17 ENCOUNTER — Other Ambulatory Visit (HOSPITAL_BASED_OUTPATIENT_CLINIC_OR_DEPARTMENT_OTHER): Payer: Self-pay

## 2023-11-17 ENCOUNTER — Ambulatory Visit (INDEPENDENT_AMBULATORY_CARE_PROVIDER_SITE_OTHER): Admitting: Orthopaedic Surgery

## 2023-11-17 DIAGNOSIS — G8929 Other chronic pain: Secondary | ICD-10-CM | POA: Diagnosis not present

## 2023-11-17 DIAGNOSIS — M25561 Pain in right knee: Secondary | ICD-10-CM

## 2023-11-17 NOTE — Progress Notes (Signed)
 Post Operative Evaluation    Procedure/Date of Surgery: Left knee MACI  and tibial tubercle osteotomy 6/18  Interval History:    Presents today status post the above procedure.  Today she is having more issues with the right side very similar with deep pain behind the knee.  She has had an injection in the past which did give her good relief.  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   32 mm Sorin Memo 4D ring annuloplasty via right mini thoracotomy approach   Past Surgical History:  Procedure Laterality Date   ABDOMINAL HYSTERECTOMY     CHOLECYSTECTOMY N/A 01/16/2020   Procedure: LAPAROSCOPIC CHOLECYSTECTOMY WITH INTRAOPERATIVE CHOLANGIOGRAM;  Surgeon: Oralee Billow, MD;  Location: [facility];  Service: General;  Laterality: N/A;   COLONOSCOPY     KNEE ARTHROSCOPY WITH MACI  CARTILAGE HARVEST Left 10/04/2022   Procedure: LEFT KNEE ARTHROSCOPY WITH MACI  CARTILAGE HARVEST;  Surgeon: Wilhelmenia Harada, MD;  Location: [facility];  Service: Orthopedics;  Laterality: Left;   KNEE ARTHROSCOPY WITH MEDIAL MENISECTOMY Left 06/07/2013   Procedure: LEFT KNEE ARTHROSCOPY WITH SYNOVECTOMY LIMITED, ARTHROSCOPY KNEE WITH DEBRIDEMENT/SHAVING (CHONDROPLASTY), ARTHROSCOPY KNEE  ChondroplastyWITH MEDIAL AND LATERAL  MENISECTOMY;  Surgeon: Saundra Curl, MD;  Location: [facility];   Service: Orthopedics;  Laterality: Left;   KNEE ARTHROSCOPY WITH MEDIAL PATELLAR FEMORAL LIGAMENT RECONSTRUCTION Left 12/20/2022   Procedure: KNEE ARTHROSCOPY WITH MEDIAL PATELLAR FEMORAL LIGAMENT RECONSTRUCTION;  Surgeon: Wilhelmenia Harada, MD;  Location: [facility];  Service: Orthopedics;  Laterality: Left;   left knee arthroscopy     MITRAL VALVE REPAIR Right 11/28/2018   Procedure: MINIMALLY INVASIVE MITRAL VALVE REPAIR (MVR) - Using Memo 4D Ring Size 32;  Surgeon: Gardenia Jump, MD;  Location: [facility] OR;  Service: Open Heart Surgery;  Laterality: Right;   patrial hysterectomy     POLYPECTOMY     SALPINGOOPHORECTOMY  2022   SYNOVECTOMY Left 06/07/2013   Procedure: SYNOVECTOMY;  Surgeon: Saundra Curl, MD;  Location: [facility];  Service: Orthopedics;  Laterality: Left;   TEE WITHOUT CARDIOVERSION  10/11/2011   Procedure: TRANSESOPHAGEAL ECHOCARDIOGRAM (TEE);  Surgeon: Loyde Rule, MD;  Location: [facility];  Service: Cardiovascular;  Laterality: N/A;   TEE WITHOUT CARDIOVERSION N/A 11/28/2018   Procedure: TRANSESOPHAGEAL ECHOCARDIOGRAM (TEE);  Surgeon: Gardenia Jump, MD;  Location: [facility] OR;  Service: Open Heart Surgery;  Laterality: N/A;   TUBAL LIGATION     WISDOM TOOTH EXTRACTION     Social History   Socioeconomic History   Marital status: Married    Spouse name: Reymundo Caulk   Number of children: 2  Years of education: Not on file   Highest education level: Master's degree (e.g., MA, MS, MEng, MEd, MSW, MBA)  Occupational History   Occupation: patient account rep    Employer: [employer]  Tobacco Use   Smoking status: Some Days    Current packs/day: 0.00    Average packs/day: 0.5 packs/day for 12.0 years (6.0 ttl pk-yrs)    Types: Cigars, Cigarettes    Start date: 11/29/1998    Last attempt to quit: 11/29/2010    Years since quitting: 12.9   Smokeless tobacco: Never  Vaping Use   Vaping status: Never Used  Substance and Sexual Activity    Alcohol use: Yes    Alcohol/week: 2.0 standard drinks of alcohol    Types: 2 Glasses of wine per week    Comment: social   Drug use: No   Sexual activity: Yes    Birth control/protection: Surgical  Other Topics Concern   Not on file  Social History Narrative   Not on file   Social Drivers of Health   Financial Resource Strain: Low Risk  (01/23/2023)   Overall Financial Resource Strain (CARDIA)    Difficulty of Paying Living Expenses: Not very hard  Food Insecurity: No Food Insecurity (01/23/2023)   Hunger Vital Sign    Worried About Running Out of Food in the Last Year: Never true    Ran Out of Food in the Last Year: Never true  Transportation Needs: No Transportation Needs (01/23/2023)   PRAPARE - Administrator, Civil Service (Medical): No    Lack of Transportation (Non-Medical): No  Physical Activity: Unknown (01/23/2023)   Exercise Vital Sign    Days of Exercise per Week: 0 days    Minutes of Exercise per Session: Not on file  Stress: No Stress Concern Present (01/23/2023)   [employer] of Occupational Health - Occupational Stress Questionnaire    Feeling of Stress : Not at all  Social Connections: Socially Integrated (01/23/2023)   Social Connection and Isolation Panel [NHANES]    Frequency of Communication with Friends and Family: More than three times a week    Frequency of Social Gatherings with Friends and Family: More than three times a week    Attends Religious Services: More than 4 times per year    Active Member of Clubs or Organizations: Yes    Attends Engineer, structural: More than 4 times per year    Marital Status: Married   Family History  Problem Relation Age of Onset   Other Mother        lynch syndrome   Colon cancer Father 58   Colon polyps Maternal Uncle    Prostate cancer Maternal Uncle 64   Colon cancer Paternal Aunt 30   Colon cancer Paternal Uncle 53   Colon cancer Paternal Uncle 102   Multiple myeloma Maternal  Grandmother 32   Parkinsonism Paternal Grandfather    Other Daughter        Lynch syndrome   Breast cancer Cousin        lynch syndrome    Breast cancer Other        Paternal Great Grandmother   Esophageal cancer Neg Hx    Rectal cancer Neg Hx    Stomach cancer Neg Hx    Allergies  Allergen Reactions   Bee Venom Swelling and Rash   Current Outpatient Medications  Medication Sig Dispense Refill   aspirin  EC 325 MG tablet Take 1 tablet (325 mg  total) by mouth daily. (Patient not taking: Reported on 06/27/2023) 30 tablet 0   ergocalciferol  (VITAMIN D2) 1.25 MG (50000 UT) capsule Take 1 capsule (50,000 Units total) by mouth once a week. 12 capsule 3   estradiol  (VIVELLE -DOT) 0.1 MG/24HR patch Place 1 patch (0.1 mg total) onto the skin 2 (two) times a week. 24 patch 0   Potassium Chloride  ER 20 MEQ TBCR Take 20 mEq by mouth 2 (two) times daily. 180 tablet 0   sacubitril -valsartan  (ENTRESTO ) 24-26 MG Take 1 tablet by mouth 2 (two) times daily. 60 tablet 1   Semaglutide -Weight Management (WEGOVY ) 0.25 MG/0.5ML SOAJ Inject 0.25 mg into the skin once a week. 2 mL 1   torsemide  (DEMADEX ) 10 MG tablet Take 3 tablets (30 mg total) by mouth daily. 270 tablet 3   traMADol  (ULTRAM ) 50 MG tablet Take 1 tablet (50 mg total) by mouth every 6 (six) hours as needed. 30 tablet 1   Ubrogepant  (UBRELVY ) 100 MG TABS Take 1 tablet (100 mg total) by mouth daily as needed. (Patient not taking: Reported on 06/27/2023) 30 tablet 1   valACYclovir (VALTREX) 500 MG tablet Take 500 mg by mouth as needed.     No current facility-administered medications for this visit.   No results found.  Review of Systems:   A ROS was performed including pertinent positives and negatives as documented in the HPI.   Musculoskeletal Exam:    Left leg is well-appearing.  Incisions are well-healed.  She is able to actively extend at the left knee with good quadricep strength able to fire tibialis anterior as well as  gastrocsoleus.  2+ dorsalis pedis pulse.  No swelling.  Range of motion is from 0 to 120 degrees with strong extension on the left with improved quadriceps tone   Right knee with tenderness about the lateral IT band.  Range of motion is from -5 to 1 to 35 degrees.  No joint line tenderness.  Positive patellar grind   Imaging:     I personally reviewed and interpreted the radiographs.   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  At this time she has trialed strengthening as well as an injection of the right knee without any relief.  At this time we will plan for an MRI right knee and follow-up discuss results Plan :    - Plan for MRI right knee and follow-up to discuss results     I personally saw and evaluated the patient, and participated in the management and treatment plan.  Wilhelmenia Harada, MD Attending Physician, Orthopedic Surgery  This document was dictated using Dragon voice recognition software. A reasonable attempt at proof reading has been made to minimize errors.

## 2023-11-20 ENCOUNTER — Encounter (HOSPITAL_BASED_OUTPATIENT_CLINIC_OR_DEPARTMENT_OTHER): Payer: Self-pay | Admitting: Orthopaedic Surgery

## 2023-11-28 ENCOUNTER — Ambulatory Visit
Admission: RE | Admit: 2023-11-28 | Discharge: 2023-11-28 | Disposition: A | Discharge status: Home / Self Care | Source: Ambulatory Visit | Attending: Orthopaedic Surgery

## 2023-11-28 DIAGNOSIS — G8929 Other chronic pain: Secondary | ICD-10-CM

## 2023-11-29 ENCOUNTER — Ambulatory Visit (HOSPITAL_BASED_OUTPATIENT_CLINIC_OR_DEPARTMENT_OTHER): Admitting: Orthopaedic Surgery

## 2023-11-29 ENCOUNTER — Other Ambulatory Visit (HOSPITAL_BASED_OUTPATIENT_CLINIC_OR_DEPARTMENT_OTHER): Payer: Self-pay

## 2023-12-11 ENCOUNTER — Other Ambulatory Visit (HOSPITAL_BASED_OUTPATIENT_CLINIC_OR_DEPARTMENT_OTHER): Payer: Self-pay

## 2023-12-13 ENCOUNTER — Ambulatory Visit (HOSPITAL_BASED_OUTPATIENT_CLINIC_OR_DEPARTMENT_OTHER): Admitting: Orthopaedic Surgery

## 2023-12-18 ENCOUNTER — Ambulatory Visit: Admitting: Gastroenterology

## 2023-12-18 NOTE — Progress Notes (Deleted)
  GI Progress Note  Chief Complaint: ***  Subjective  Prior history  48 y.o. female  with a past medical history of Lynch syndrome with personal history of adenomatous polyps, status post cholecystectomy 2021, status post abdominal hysterectomy, mitral valve repair 2020  Annual colonoscopies with both adenomatous and serrated polyps on some exams 2 upper endoscopies (September 2020-scattered gastric erosions with H. pylori negative, then April 2023 for epigastric pain and GERD abnormal study) Last colonoscopy December 2024-subcentimeter left colon hyperplastic polyp.  Scattered diverticulosis, internal hemorrhoids   Discussed the use of AI scribe software for clinical note transcription with the patient, who gave verbal consent to proceed.  History of Present Illness     ROS: Cardiovascular:  no chest pain Respiratory: no dyspnea  The patient's Past Medical, Family and Social History were reviewed and are on file in the EMR. Past Medical History:  Diagnosis Date   Adenomatous colon polyp 12/2012   Allergic rhinitis    Arthritis    Cellulitis    Common migraine    Diastolic dysfunction    Dysrhythmia    External hemorrhoid    Genital herpes    GERD (gastroesophageal reflux disease)    Hx of migraines    Hyperlipidemia    Hypertension    Lynch syndrome    Mitral regurgitation    MVP (mitral valve prolapse)    Plica syndrome of left knee    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   32 mm Sorin Memo 4D ring annuloplasty via right mini thoracotomy approach    Past Surgical History:  Procedure Laterality Date   ABDOMINAL HYSTERECTOMY     CHOLECYSTECTOMY N/A 01/16/2020   Procedure: LAPAROSCOPIC CHOLECYSTECTOMY WITH INTRAOPERATIVE CHOLANGIOGRAM;  Surgeon: Oralee Billow, MD;  Location: [facility];  Service: General;  Laterality: N/A;   COLONOSCOPY     KNEE ARTHROSCOPY WITH MACI  CARTILAGE HARVEST Left 10/04/2022   Procedure: LEFT KNEE ARTHROSCOPY  WITH MACI  CARTILAGE HARVEST;  Surgeon: Wilhelmenia Harada, MD;  Location: [facility];  Service: Orthopedics;  Laterality: Left;   KNEE ARTHROSCOPY WITH MEDIAL MENISECTOMY Left 06/07/2013   Procedure: LEFT KNEE ARTHROSCOPY WITH SYNOVECTOMY LIMITED, ARTHROSCOPY KNEE WITH DEBRIDEMENT/SHAVING (CHONDROPLASTY), ARTHROSCOPY KNEE  ChondroplastyWITH MEDIAL AND LATERAL  MENISECTOMY;  Surgeon: Saundra Curl, MD;  Location: [facility];  Service: Orthopedics;  Laterality: Left;   KNEE ARTHROSCOPY WITH MEDIAL PATELLAR FEMORAL LIGAMENT RECONSTRUCTION Left 12/20/2022   Procedure: KNEE ARTHROSCOPY WITH MEDIAL PATELLAR FEMORAL LIGAMENT RECONSTRUCTION;  Surgeon: Wilhelmenia Harada, MD;  Location: [facility];  Service: Orthopedics;  Laterality: Left;   left knee arthroscopy     MITRAL VALVE REPAIR Right 11/28/2018   Procedure: MINIMALLY INVASIVE MITRAL VALVE REPAIR (MVR) - Using Memo 4D Ring Size 32;  Surgeon: Gardenia Jump, MD;  Location: [facility] OR;  Service: Open Heart Surgery;  Laterality: Right;   patrial hysterectomy     POLYPECTOMY     SALPINGOOPHORECTOMY  2022   SYNOVECTOMY Left 06/07/2013   Procedure: SYNOVECTOMY;  Surgeon: Saundra Curl, MD;  Location:  SURGERY CENTER;  Service: Orthopedics;  Laterality: Left;   TEE WITHOUT CARDIOVERSION  10/11/2011   Procedure: TRANSESOPHAGEAL ECHOCARDIOGRAM (TEE);  Surgeon: Loyde Rule, MD;  Location: [facility];  Service: Cardiovascular;  Laterality: N/A;   TEE WITHOUT CARDIOVERSION N/A 11/28/2018   Procedure: TRANSESOPHAGEAL ECHOCARDIOGRAM (TEE);  Surgeon: Gardenia Jump, MD;  Location: [facility] OR;  Service: Open Heart Surgery;  Laterality: N/A;   TUBAL  LIGATION     WISDOM TOOTH EXTRACTION       Objective:  Med list reviewed  Current Outpatient Medications:    aspirin  EC 325 MG tablet, Take 1 tablet (325 mg total) by mouth daily. (Patient not taking: Reported on 06/27/2023), Disp: 30 tablet, Rfl: 0    ergocalciferol  (VITAMIN D2) 1.25 MG (50000 UT) capsule, Take 1 capsule (50,000 Units total) by mouth once a week., Disp: 12 capsule, Rfl: 3   estradiol  (VIVELLE -DOT) 0.1 MG/24HR patch, Place 1 patch (0.1 mg total) onto the skin 2 (two) times a week., Disp: 24 patch, Rfl: 0   Potassium Chloride  ER 20 MEQ TBCR, Take 20 mEq by mouth 2 (two) times daily., Disp: 180 tablet, Rfl: 0   sacubitril -valsartan  (ENTRESTO ) 24-26 MG, Take 1 tablet by mouth 2 (two) times daily., Disp: 60 tablet, Rfl: 1   Semaglutide -Weight Management (WEGOVY ) 0.25 MG/0.5ML SOAJ, Inject 0.25 mg into the skin once a week., Disp: 2 mL, Rfl: 1   torsemide  (DEMADEX ) 10 MG tablet, Take 3 tablets (30 mg total) by mouth daily., Disp: 270 tablet, Rfl: 3   traMADol  (ULTRAM ) 50 MG tablet, Take 1 tablet (50 mg total) by mouth every 6 (six) hours as needed., Disp: 30 tablet, Rfl: 1   Ubrogepant  (UBRELVY ) 100 MG TABS, Take 1 tablet (100 mg total) by mouth daily as needed. (Patient not taking: Reported on 06/27/2023), Disp: 30 tablet, Rfl: 1   valACYclovir (VALTREX) 500 MG tablet, Take 500 mg by mouth as needed., Disp: , Rfl:    Vital signs in last 24 hrs: There were no vitals filed for this visit. Wt Readings from Last 3 Encounters:  06/27/23 186 lb (84.4 kg)  06/06/23 187 lb (84.8 kg)  05/26/23 189 lb (85.7 kg)    Physical Exam  *** HEENT: sclera anicteric, oral mucosa moist without lesions Neck: supple, no thyromegaly, JVD or lymphadenopathy Cardiac: ***,  no peripheral edema Pulm: clear to auscultation bilaterally, normal RR and effort noted Abdomen: soft, *** tenderness, with active bowel sounds. No guarding or palpable hepatosplenomegaly. Skin; warm and dry, no jaundice or rash   Labs:   ___________________________________________ Radiologic studies:   ____________________________________________ Other:   _____________________________________________   No diagnosis found.  Assessment and Plan Assessment &  Plan      Plan:   *** minutes were spent on this encounter (including chart review, history/exam, counseling/coordination of care, and documentation) > 50% of that time was spent on counseling and coordination of care.   Kerby Pearson III

## 2023-12-19 ENCOUNTER — Other Ambulatory Visit (HOSPITAL_BASED_OUTPATIENT_CLINIC_OR_DEPARTMENT_OTHER): Payer: Self-pay

## 2023-12-19 MED ORDER — DOXYCYCLINE HYCLATE 100 MG PO CAPS
100.0000 mg | ORAL_CAPSULE | Freq: Two times a day (BID) | ORAL | 0 refills | Status: DC
  Filled 2023-12-19 – 2023-12-28 (×2): qty 60, 30d supply, fill #0

## 2023-12-25 ENCOUNTER — Ambulatory Visit (INDEPENDENT_AMBULATORY_CARE_PROVIDER_SITE_OTHER): Admitting: Internal Medicine

## 2023-12-25 ENCOUNTER — Encounter: Payer: Self-pay | Admitting: Internal Medicine

## 2023-12-25 ENCOUNTER — Other Ambulatory Visit (HOSPITAL_BASED_OUTPATIENT_CLINIC_OR_DEPARTMENT_OTHER): Payer: Self-pay

## 2023-12-25 ENCOUNTER — Ambulatory Visit: Payer: Self-pay

## 2023-12-25 VITALS — BP 124/72 | HR 108 | Temp 99.7°F | Ht 63.0 in | Wt 188.0 lb

## 2023-12-25 DIAGNOSIS — I5032 Chronic diastolic (congestive) heart failure: Secondary | ICD-10-CM | POA: Diagnosis not present

## 2023-12-25 DIAGNOSIS — L509 Urticaria, unspecified: Secondary | ICD-10-CM | POA: Diagnosis not present

## 2023-12-25 DIAGNOSIS — L03115 Cellulitis of right lower limb: Secondary | ICD-10-CM | POA: Insufficient documentation

## 2023-12-25 MED ORDER — CEPHALEXIN 500 MG PO CAPS
500.0000 mg | ORAL_CAPSULE | Freq: Three times a day (TID) | ORAL | 0 refills | Status: DC
  Filled 2023-12-25: qty 21, 7d supply, fill #0

## 2023-12-25 MED ORDER — PREDNISONE 10 MG PO TABS
ORAL_TABLET | ORAL | 0 refills | Status: AC
  Filled 2023-12-25: qty 18, 9d supply, fill #0

## 2023-12-25 MED ORDER — METHYLPREDNISOLONE ACETATE 40 MG/ML IJ SUSP
40.0000 mg | Freq: Once | INTRAMUSCULAR | Status: AC
  Administered 2023-12-25: 40 mg via INTRAMUSCULAR

## 2023-12-25 NOTE — Telephone Encounter (Signed)
 Pt called back but hung up prtor to transfer.

## 2023-12-25 NOTE — Assessment & Plan Note (Addendum)
 Mild to mod, small area right mid lateral thigh, for antibx course, doxycyline 100 bid,  to f/u any worsening symptoms or concerns

## 2023-12-25 NOTE — Patient Instructions (Signed)
 You had the steroid shot today  Please take all new medication as prescribed- the prednisone, and antibiotic  Please continue all other medications as before, and refills have been done if requested.  Please have the pharmacy call with any other refills you may need.  Please keep your appointments with your specialists as you may have planned

## 2023-12-25 NOTE — Assessment & Plan Note (Signed)
Stable volume, cont current med tx 

## 2023-12-25 NOTE — Telephone Encounter (Signed)
 FYI Only or Action Required?: FYI only for provider.  Patient was last seen in primary care on 04/14/2023 by Norleen Lynwood ORN, MD. Called Nurse Triage reporting Allergic Reaction. Symptoms began several days ago. Interventions attempted: OTC medications: Benadryl  and Allegra. Symptoms are: stable.  Triage Disposition: See Physician Within 24 Hours  Patient/caregiver understands and will follow disposition?: Yes                             Copied from CRM [id number]. Topic: Clinical - Red Word Triage >> Dec 25, 2023  9:43 AM Berwyn MATSU wrote: Patient called in advising allergic reaction shut eyes and hives. Reason for Disposition  Eyelids are very swollen (shut or almost)  Answer Assessment - Initial Assessment Questions 1. SEVERITY: How bad is the itching?  (e.g., Scale 1-10; mild, moderate or severe)     Itching a 3 at this time, states itching has been at a 10  2. ONSET: When did the eye symptoms start? (e.g., hours or days ago)     Thursday 3. EYELIDS: Are the eyelids swollen? If Yes, ask: How much?     States right eye is more swollen than left, states eyelids are very swollen, but she is still able to see 4. EYE DISCHARGE: Is there any discharge from the eye, or eyelid crusting? If Yes, ask: How much?     Denies 5. TRIGGER: What do you think triggered the allergic reaction? (e.g., dust, smoke, pollen, new eye make-up)     Unsure 7. CONTACTS: Do you wear contacts?     Denies, wears glasses 8. OTHER SYMPTOMS: Do you have any other symptoms? (e.g., runny nose)     Redness, hives on Friday night, states hives have gone down, congestion Denies difficulty breathing, denies tongue and lip swelling, denies difficulty swallowing  Benadryl  and Allegra have not helped Spider bite from last year appears to be infected, per dermatologist at appointment last week  Protocols used: Eye - Allergy-A-AH

## 2023-12-25 NOTE — Progress Notes (Signed)
 Patient ID: [name], female   DOB: [birth year], 48 y.o.   MRN: [id number]        Chief Complaint: follow up 1 day onset hive like rash to face and eyelids for unclear reason, no toxic or known allergic exposure; right leg cellulitis, chf       HPI:  [name] is a 48 y.o. female here with above, but no tongue or throat swelling, and Pt denies chest pain, increased sob or doe, wheezing, orthopnea, PND, increased LE swelling, palpitations, dizziness or syncope.   Pt denies polydipsia, polyuria, or new focal neuro s/s.   Also has right mid lateral thigh redness tender swelling but no drainage.          Wt Readings from Last 3 Encounters:  12/25/23 188 lb (85.3 kg)  06/27/23 186 lb (84.4 kg)  06/06/23 187 lb (84.8 kg)   BP Readings from Last 3 Encounters:  12/25/23 124/72  09/12/23 116/78  06/27/23 124/88         Past Medical History:  Diagnosis Date   Adenomatous colon polyp 12/2012   Allergic rhinitis    Arthritis    Cellulitis    Common migraine    Diastolic dysfunction    Dysrhythmia    External hemorrhoid    Genital herpes    GERD (gastroesophageal reflux disease)    Hx of migraines    Hyperlipidemia    Hypertension    Lynch syndrome    Mitral regurgitation    MVP (mitral valve prolapse)    Plica syndrome of left knee    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   32 mm Sorin Memo 4D ring annuloplasty via right mini thoracotomy approach   Past Surgical History:  Procedure Laterality Date   ABDOMINAL HYSTERECTOMY     CHOLECYSTECTOMY N/A 01/16/2020   Procedure: LAPAROSCOPIC CHOLECYSTECTOMY WITH INTRAOPERATIVE CHOLANGIOGRAM;  Surgeon: Eletha Boas, MD;  Location: [facility];  Service: General;  Laterality: N/A;   COLONOSCOPY     KNEE ARTHROSCOPY WITH MACI  CARTILAGE HARVEST Left 10/04/2022   Procedure: LEFT KNEE ARTHROSCOPY WITH MACI  CARTILAGE HARVEST;  Surgeon: Genelle Standing, MD;  Location: [facility];  Service: Orthopedics;   Laterality: Left;   KNEE ARTHROSCOPY WITH MEDIAL MENISECTOMY Left 06/07/2013   Procedure: LEFT KNEE ARTHROSCOPY WITH SYNOVECTOMY LIMITED, ARTHROSCOPY KNEE WITH DEBRIDEMENT/SHAVING (CHONDROPLASTY), ARTHROSCOPY KNEE  ChondroplastyWITH MEDIAL AND LATERAL  MENISECTOMY;  Surgeon: Evalene JONETTA Chancy, MD;  Location: [facility];  Service: Orthopedics;  Laterality: Left;   KNEE ARTHROSCOPY WITH MEDIAL PATELLAR FEMORAL LIGAMENT RECONSTRUCTION Left 12/20/2022   Procedure: KNEE ARTHROSCOPY WITH MEDIAL PATELLAR FEMORAL LIGAMENT RECONSTRUCTION;  Surgeon: Genelle Standing, MD;  Location: [facility];  Service: Orthopedics;  Laterality: Left;   left knee arthroscopy     MITRAL VALVE REPAIR Right 11/28/2018   Procedure: MINIMALLY INVASIVE MITRAL VALVE REPAIR (MVR) - Using Memo 4D Ring Size 32;  Surgeon: Dusty Sudie DEL, MD;  Location: [facility] OR;  Service: Open Heart Surgery;  Laterality: Right;   patrial hysterectomy     POLYPECTOMY     SALPINGOOPHORECTOMY  2022   SYNOVECTOMY Left 06/07/2013   Procedure: SYNOVECTOMY;  Surgeon: Evalene JONETTA Chancy, MD;  Location: [facility];  Service: Orthopedics;  Laterality: Left;   TEE WITHOUT CARDIOVERSION  10/11/2011   Procedure: TRANSESOPHAGEAL ECHOCARDIOGRAM (TEE);  Surgeon: Maude JAYSON Emmer, MD;  Location: [facility];  Service: Cardiovascular;  Laterality: N/A;   TEE WITHOUT CARDIOVERSION N/A 11/28/2018  Procedure: TRANSESOPHAGEAL ECHOCARDIOGRAM (TEE);  Surgeon: Dusty Sudie DEL, MD;  Location: [facility] OR;  Service: Open Heart Surgery;  Laterality: N/A;   TUBAL LIGATION     WISDOM TOOTH EXTRACTION      reports that she has been smoking cigars and cigarettes. She started smoking about 25 years ago. She has a 6 pack-year smoking history. She has never used smokeless tobacco. She reports current alcohol use of about 2.0 standard drinks of alcohol per week. She reports that she does not use drugs. family history includes Breast cancer in her  cousin and another family member; Colon cancer (age of onset: 45) in her paternal aunt; Colon cancer (age of onset: 66) in her paternal uncle; Colon cancer (age of onset: 75) in her paternal uncle; Colon cancer (age of onset: 52) in her father; Colon polyps in her maternal uncle; Multiple myeloma (age of onset: 55) in her maternal grandmother; Other in her daughter and mother; Parkinsonism in her paternal grandfather; Prostate cancer (age of onset: 69) in her maternal uncle. Allergies  Allergen Reactions   Bee Venom Swelling and Rash   Current Outpatient Medications on File Prior to Visit  Medication Sig Dispense Refill   estradiol  (VIVELLE -DOT) 0.1 MG/24HR patch Place 1 patch (0.1 mg total) onto the skin 2 (two) times a week. 24 patch 0   torsemide  (DEMADEX ) 10 MG tablet Take 3 tablets (30 mg total) by mouth daily. 270 tablet 3   aspirin  EC 325 MG tablet Take 1 tablet (325 mg total) by mouth daily. (Patient not taking: Reported on 06/27/2023) 30 tablet 0   doxycycline  (VIBRAMYCIN ) 100 MG capsule Take 1 capsule (100 mg total) by mouth 2 (two) times daily with food and water to avoid stomach upset. 60 capsule 0   ergocalciferol  (VITAMIN D2) 1.25 MG (50000 UT) capsule Take 1 capsule (50,000 Units total) by mouth once a week. 12 capsule 3   Potassium Chloride  ER 20 MEQ TBCR Take 20 mEq by mouth 2 (two) times daily. 180 tablet 0   sacubitril -valsartan  (ENTRESTO ) 24-26 MG Take 1 tablet by mouth 2 (two) times daily. 60 tablet 1   Semaglutide -Weight Management (WEGOVY ) 0.25 MG/0.5ML SOAJ Inject 0.25 mg into the skin once a week. 2 mL 1   traMADol  (ULTRAM ) 50 MG tablet Take 1 tablet (50 mg total) by mouth every 6 (six) hours as needed. 30 tablet 1   Ubrogepant  (UBRELVY ) 100 MG TABS Take 1 tablet (100 mg total) by mouth daily as needed. (Patient not taking: Reported on 06/27/2023) 30 tablet 1   valACYclovir (VALTREX) 500 MG tablet Take 500 mg by mouth as needed.     No current facility-administered  medications on file prior to visit.        ROS:  All others reviewed and negative.  Objective        PE:  BP 124/72 (BP Location: Right Arm, Patient Position: Sitting, Cuff Size: Normal)   Pulse (!) 108   Temp 99.7 F (37.6 C) (Oral)   Ht 5' 3 (1.6 m)   Wt 188 lb (85.3 kg)   LMP 01/12/2012   SpO2 98%   BMI 33.30 kg/m                 Constitutional: Pt appears in NAD               HENT: Head: NCAT.                Right Ear: External ear normal.  Left Ear: External ear normal.                Eyes: . Pupils are equal, round, and reactive to light. Conjunctivae and EOM are normal               Nose: without d/c or deformity               Neck: Neck supple. Gross normal ROM               Cardiovascular: Normal rate and regular rhythm.                 Pulmonary/Chest: Effort normal and breath sounds without rales or wheezing.                Abd:  Soft, NT, ND, + BS, no organomegaly               Neurological: Pt is alert. At baseline orientation, motor grossly intact               Skin: Skin is warm, LE edema - none, face with diffuse erythema rash bilateral hive like with diffuse bilateral eyelids swelling as well with itching               Psychiatric: Pt behavior is normal without agitation   Micro: none  Cardiac tracings I have personally interpreted today:  none  Pertinent Radiological findings (summarize): none   Lab Results  Component Value Date   WBC 11.6 (H) 05/04/2023   HGB 14.0 05/04/2023   HCT 41.4 05/04/2023   PLT 289 05/04/2023   GLUCOSE 93 05/04/2023   CHOL 220 (H) 10/27/2022   TRIG 156.0 (H) 10/27/2022   HDL 49.00 10/27/2022   LDLCALC 140 (H) 10/27/2022   ALT 16 10/27/2022   AST 17 10/27/2022   NA 141 05/04/2023   K 3.6 05/04/2023   CL 103 05/04/2023   CREATININE 1.05 (H) 05/04/2023   BUN 17 05/04/2023   CO2 30 05/04/2023   TSH 1.51 10/27/2022   INR 2.3 02/05/2019   HGBA1C 5.7 10/27/2022   Assessment/Plan:  [name]  is a 48 y.o. Black or African American [2] female with  has a past medical history of Adenomatous colon polyp (12/2012), Allergic rhinitis, Arthritis, Cellulitis, Common migraine, Diastolic dysfunction, Dysrhythmia, External hemorrhoid, Genital herpes, GERD (gastroesophageal reflux disease), migraines, Hyperlipidemia, Hypertension, Lynch syndrome, Mitral regurgitation, MVP (mitral valve prolapse), Plica syndrome of left knee, and S/P minimally invasive mitral valve repair (11/28/2018).  Hives Mild to mod, for depomedrol 80 mg IM, prednisone taper, ,  to f/u any worsening symptoms or concerns  Cellulitis of right leg Mild to mod, small area right mid lateral thigh, for antibx course, doxycyline 100 bid,  to f/u any worsening symptoms or concerns  Chronic diastolic CHF (congestive heart failure) (HCC) Stable volume, cont current med tx  Followup: Return if symptoms worsen or fail to improve.  Lynwood Rush, MD 12/25/2023 9:02 PM  Medical Group  Primary Care - [facility]

## 2023-12-25 NOTE — Telephone Encounter (Signed)
 Ok for work note - I sent this by mychart

## 2023-12-25 NOTE — Assessment & Plan Note (Signed)
 Mild to mod, for depomedrol 80 mg IM, prednisone taper,,  to f/u any worsening symptoms or concerns

## 2023-12-27 ENCOUNTER — Other Ambulatory Visit (HOSPITAL_BASED_OUTPATIENT_CLINIC_OR_DEPARTMENT_OTHER): Payer: Self-pay

## 2023-12-27 ENCOUNTER — Encounter: Payer: Self-pay | Admitting: Pharmacist

## 2023-12-27 ENCOUNTER — Other Ambulatory Visit: Payer: Self-pay

## 2023-12-28 ENCOUNTER — Other Ambulatory Visit (HOSPITAL_BASED_OUTPATIENT_CLINIC_OR_DEPARTMENT_OTHER): Payer: Self-pay

## 2023-12-28 ENCOUNTER — Other Ambulatory Visit (HOSPITAL_COMMUNITY): Payer: Self-pay

## 2023-12-28 MED ORDER — FLUCONAZOLE 150 MG PO TABS
ORAL_TABLET | ORAL | 1 refills | Status: DC
  Filled 2023-12-28: qty 2, 3d supply, fill #0
  Filled 2023-12-29: qty 2, 3d supply, fill #1

## 2023-12-29 ENCOUNTER — Other Ambulatory Visit (HOSPITAL_BASED_OUTPATIENT_CLINIC_OR_DEPARTMENT_OTHER): Payer: Self-pay

## 2024-01-09 ENCOUNTER — Other Ambulatory Visit (HOSPITAL_BASED_OUTPATIENT_CLINIC_OR_DEPARTMENT_OTHER): Payer: Self-pay

## 2024-01-09 MED ORDER — ESTRADIOL 0.1 MG/24HR TD PTTW
1.0000 | MEDICATED_PATCH | TRANSDERMAL | 3 refills | Status: DC
  Filled 2024-01-09: qty 8, 28d supply, fill #0

## 2024-01-11 ENCOUNTER — Other Ambulatory Visit (HOSPITAL_BASED_OUTPATIENT_CLINIC_OR_DEPARTMENT_OTHER): Payer: Self-pay

## 2024-01-11 ENCOUNTER — Ambulatory Visit: Attending: Internal Medicine | Admitting: Pharmacist

## 2024-01-11 ENCOUNTER — Encounter: Payer: Self-pay | Admitting: Pharmacist

## 2024-01-11 VITALS — BP 122/87 | HR 104 | Wt 187.6 lb

## 2024-01-11 DIAGNOSIS — I5032 Chronic diastolic (congestive) heart failure: Secondary | ICD-10-CM | POA: Diagnosis not present

## 2024-01-11 DIAGNOSIS — I10 Essential (primary) hypertension: Secondary | ICD-10-CM | POA: Diagnosis not present

## 2024-01-11 MED ORDER — SACUBITRIL-VALSARTAN 24-26 MG PO TABS
1.0000 | ORAL_TABLET | Freq: Two times a day (BID) | ORAL | 1 refills | Status: DC
  Filled 2024-01-11 – 2024-01-23 (×2): qty 60, 30d supply, fill #0
  Filled 2024-03-29: qty 60, 30d supply, fill #1

## 2024-01-11 MED ORDER — EMPAGLIFLOZIN 10 MG PO TABS
10.0000 mg | ORAL_TABLET | Freq: Every day | ORAL | 5 refills | Status: AC
  Filled 2024-01-11 – 2024-01-20 (×2): qty 30, 30d supply, fill #0
  Filled 2024-02-16 – 2024-02-26 (×2): qty 30, 30d supply, fill #1
  Filled 2024-04-18: qty 30, 30d supply, fill #2
  Filled 2024-05-20: qty 30, 30d supply, fill #3
  Filled 2024-08-05: qty 30, 30d supply, fill #4

## 2024-01-11 NOTE — Patient Instructions (Addendum)
 It was good seeing you again  We would like your blood pressure to be less than 130/80  Please restart your Entresto  24-26mg  twice a day  We will add on a new medication called Jardiance  10mg  once a day in the morning  Try to reduce your torsemide  to as needed rather than every day  I will see you back on 8/21 to adjust meds if needed  Chris Dyonna Jaspers, PharmD, BCACP, CDCES, CPP [facility] [address], [place], [place] [address] Phone: [phone]; Fax: [phone] 01/11/2024 1:08 PM

## 2024-01-11 NOTE — Progress Notes (Signed)
 Patient ID: LING FLESCH                 DOB: 06/24/1976                      MRN: [id number]     HPI: [name] is a 48 y.o. female referred by Dr. Lavona to HTN clinic. PMH is significant for MR, mitral valve prolapse, HTN, and CHF.   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  Last seen on 09/12/23. Patient was only taking torsemide  30mg  daily. Recommended she start a different antihypertensive such as Entresto  and change torsemide  to PRN. Recommended check labs and recheck in a month. Patient canceled appt.  Sent myChart message this week asking to be seen.   Presents today to discuss medications and fluid retention. Did not pick up refill of Entresto  when initially finished bottle. Had lab work updated at [facility] on 10/03/23. Scr 1.03, eGFR 67.  Does not appear fluid overloaded today. She took torsemide  this morning but had not needed it recently.    Current HTN meds:  Torsemide  30mg  daily  Previously tried:  Metoprolol  50mg   Losartan  50mg   BP goal: <130/80  Wt Readings from Last 3 Encounters:  06/27/23 186 lb (84.4 kg)  06/06/23 187 lb (84.8 kg)  05/26/23 189 lb (85.7 kg)   BP Readings from Last 3 Encounters:  06/27/23 124/88  05/26/23 118/80  05/04/23 122/85   Pulse Readings from Last 3 Encounters:  06/27/23 68  05/26/23 76  05/04/23 73    Renal function: CrCl cannot be calculated (Patient's most recent lab result is older than the maximum 21 days allowed.).  Past Medical History:  Diagnosis Date   Adenomatous colon polyp 12/2012   Allergic rhinitis    Arthritis    Cellulitis    Common migraine    Diastolic dysfunction    Dysrhythmia    External hemorrhoid    Genital herpes    GERD (gastroesophageal reflux disease)    Hx of migraines    Hyperlipidemia    Hypertension    Lynch syndrome    Mitral regurgitation    MVP (mitral valve  prolapse)    Plica syndrome of left knee    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   32 mm Sorin Memo 4D ring annuloplasty via right mini thoracotomy approach    Current Outpatient Medications on File Prior to Visit  Medication Sig Dispense Refill   aspirin  EC 325 MG tablet Take 1 tablet (325 mg total) by mouth daily. (Patient not taking: Reported on 06/27/2023) 30 tablet 0   estradiol  (VIVELLE -DOT) 0.1 MG/24HR patch Place 1 patch (0.1 mg total) onto the skin 2 (two) times a week. 24 patch 3   estradiol  (VIVELLE -DOT) 0.1 MG/24HR patch Place 1 patch (0.1 mg total) onto the skin 2 (two) times a week. 8 patch 6   meloxicam  (MOBIC ) 15 MG tablet Take 1 tablet (15 mg total) by mouth daily. (Patient not taking: Reported on 06/27/2023) 7 tablet 0   ondansetron  (ZOFRAN ) 4 MG tablet Take 1 tablet (4 mg total) by mouth as directed. Take 1 tablet 45 min - 1 hour prior to drinking each dose of your prep (Patient not taking: Reported on 06/27/2023) 2 tablet 0   Potassium Chloride  ER 20 MEQ TBCR Take 20 mEq by mouth 2 (two) times daily. 180 tablet 0   Semaglutide -Weight  Management (WEGOVY ) 0.25 MG/0.5ML SOAJ Inject 0.25 mg into the skin once a week. 2 mL 1   torsemide  (DEMADEX ) 10 MG tablet Take 3 tablets (30 mg total) by mouth daily. 270 tablet 3   traMADol  (ULTRAM ) 50 MG tablet Take 1 tablet (50 mg total) by mouth every 6 (six) hours as needed. 30 tablet 1   Ubrogepant  (UBRELVY ) 100 MG TABS Take 1 tablet (100 mg total) by mouth daily as needed. (Patient not taking: Reported on 06/27/2023) 30 tablet 1   valACYclovir (VALTREX) 500 MG tablet Take 500 mg by mouth as needed.     No current facility-administered medications on file prior to visit.    Allergies  Allergen Reactions   Bee Venom Swelling and Rash     Assessment/Plan:  1. Hypertension -  Patient BP in room 122/87 today.  Recommended she restart Entresto  and continue twice daily as it will help her cardiac function and put less  stress on her kidneys than daily torsemide . Patient voiced understanding. She is still concerned about fluid overload. Will also start Jardiance  10mg  once daily. Free trial card provided.  Switch torsemide  to PRN.  Will see patient back in 4 weeks for further medication titration.  Restart Entresto  24-26mg  BID Start Jardiance  10mg  daily Change torsemide  to PRN Recheck in 4 weeks  Medford Bolk, PharmD, BCACP, CDCES, CPP [facility] [address], [place], [place] [address] Phone: [phone]; Fax: [phone] 01/11/2024 2:18 PM

## 2024-01-16 ENCOUNTER — Other Ambulatory Visit (HOSPITAL_BASED_OUTPATIENT_CLINIC_OR_DEPARTMENT_OTHER): Payer: Self-pay

## 2024-01-18 ENCOUNTER — Other Ambulatory Visit (HOSPITAL_BASED_OUTPATIENT_CLINIC_OR_DEPARTMENT_OTHER): Payer: Self-pay

## 2024-01-18 MED ORDER — DOXYCYCLINE HYCLATE 100 MG PO CAPS
100.0000 mg | ORAL_CAPSULE | Freq: Two times a day (BID) | ORAL | 0 refills | Status: DC
  Filled 2024-01-18 – 2024-02-12 (×4): qty 60, 30d supply, fill #0

## 2024-01-19 ENCOUNTER — Other Ambulatory Visit (HOSPITAL_BASED_OUTPATIENT_CLINIC_OR_DEPARTMENT_OTHER): Payer: Self-pay

## 2024-01-20 ENCOUNTER — Other Ambulatory Visit (HOSPITAL_BASED_OUTPATIENT_CLINIC_OR_DEPARTMENT_OTHER): Payer: Self-pay

## 2024-01-22 ENCOUNTER — Other Ambulatory Visit (HOSPITAL_BASED_OUTPATIENT_CLINIC_OR_DEPARTMENT_OTHER): Payer: Self-pay

## 2024-01-22 MED ORDER — GABAPENTIN 100 MG PO CAPS
100.0000 mg | ORAL_CAPSULE | Freq: Every evening | ORAL | 3 refills | Status: AC
  Filled 2024-01-22: qty 30, 30d supply, fill #0
  Filled 2024-02-16 – 2024-02-26 (×2): qty 30, 30d supply, fill #1
  Filled 2024-05-20: qty 30, 30d supply, fill #2
  Filled 2024-07-01 – 2024-07-11 (×2): qty 30, 30d supply, fill #3

## 2024-01-23 ENCOUNTER — Other Ambulatory Visit (HOSPITAL_BASED_OUTPATIENT_CLINIC_OR_DEPARTMENT_OTHER): Payer: Self-pay

## 2024-01-24 ENCOUNTER — Other Ambulatory Visit (HOSPITAL_BASED_OUTPATIENT_CLINIC_OR_DEPARTMENT_OTHER): Payer: Self-pay

## 2024-01-30 ENCOUNTER — Other Ambulatory Visit (HOSPITAL_BASED_OUTPATIENT_CLINIC_OR_DEPARTMENT_OTHER): Payer: Self-pay

## 2024-01-31 ENCOUNTER — Other Ambulatory Visit (HOSPITAL_BASED_OUTPATIENT_CLINIC_OR_DEPARTMENT_OTHER): Payer: Self-pay

## 2024-02-01 ENCOUNTER — Ambulatory Visit (HOSPITAL_BASED_OUTPATIENT_CLINIC_OR_DEPARTMENT_OTHER): Admitting: Orthopaedic Surgery

## 2024-02-08 ENCOUNTER — Ambulatory Visit (HOSPITAL_BASED_OUTPATIENT_CLINIC_OR_DEPARTMENT_OTHER): Admitting: Orthopaedic Surgery

## 2024-02-10 ENCOUNTER — Other Ambulatory Visit (HOSPITAL_BASED_OUTPATIENT_CLINIC_OR_DEPARTMENT_OTHER): Payer: Self-pay

## 2024-02-12 ENCOUNTER — Encounter: Payer: Self-pay | Admitting: Internal Medicine

## 2024-02-12 ENCOUNTER — Other Ambulatory Visit (HOSPITAL_BASED_OUTPATIENT_CLINIC_OR_DEPARTMENT_OTHER): Payer: Self-pay

## 2024-02-15 ENCOUNTER — Other Ambulatory Visit: Payer: Self-pay

## 2024-02-15 ENCOUNTER — Emergency Department (HOSPITAL_BASED_OUTPATIENT_CLINIC_OR_DEPARTMENT_OTHER)

## 2024-02-15 ENCOUNTER — Other Ambulatory Visit (HOSPITAL_BASED_OUTPATIENT_CLINIC_OR_DEPARTMENT_OTHER): Payer: Self-pay

## 2024-02-15 ENCOUNTER — Ambulatory Visit (INDEPENDENT_AMBULATORY_CARE_PROVIDER_SITE_OTHER)

## 2024-02-15 ENCOUNTER — Ambulatory Visit (INDEPENDENT_AMBULATORY_CARE_PROVIDER_SITE_OTHER): Admitting: Internal Medicine

## 2024-02-15 ENCOUNTER — Ambulatory Visit: Payer: Self-pay | Admitting: Internal Medicine

## 2024-02-15 ENCOUNTER — Encounter (HOSPITAL_BASED_OUTPATIENT_CLINIC_OR_DEPARTMENT_OTHER): Payer: Self-pay

## 2024-02-15 ENCOUNTER — Emergency Department (HOSPITAL_BASED_OUTPATIENT_CLINIC_OR_DEPARTMENT_OTHER)
Admission: EM | Admit: 2024-02-15 | Discharge: 2024-02-15 | Disposition: A | Discharge status: Home / Self Care | Source: Ambulatory Visit | Attending: Emergency Medicine | Admitting: Emergency Medicine

## 2024-02-15 ENCOUNTER — Encounter: Payer: Self-pay | Admitting: Internal Medicine

## 2024-02-15 ENCOUNTER — Telehealth: Payer: Self-pay

## 2024-02-15 VITALS — BP 114/86 | HR 97 | Temp 98.7°F | Ht 63.0 in | Wt 186.4 lb

## 2024-02-15 DIAGNOSIS — R911 Solitary pulmonary nodule: Secondary | ICD-10-CM | POA: Diagnosis not present

## 2024-02-15 DIAGNOSIS — R0781 Pleurodynia: Secondary | ICD-10-CM

## 2024-02-15 DIAGNOSIS — E038 Other specified hypothyroidism: Secondary | ICD-10-CM | POA: Diagnosis not present

## 2024-02-15 DIAGNOSIS — I110 Hypertensive heart disease with heart failure: Secondary | ICD-10-CM | POA: Diagnosis not present

## 2024-02-15 DIAGNOSIS — I509 Heart failure, unspecified: Secondary | ICD-10-CM | POA: Insufficient documentation

## 2024-02-15 DIAGNOSIS — J22 Unspecified acute lower respiratory infection: Secondary | ICD-10-CM

## 2024-02-15 DIAGNOSIS — B349 Viral infection, unspecified: Secondary | ICD-10-CM | POA: Diagnosis not present

## 2024-02-15 DIAGNOSIS — E876 Hypokalemia: Secondary | ICD-10-CM | POA: Insufficient documentation

## 2024-02-15 DIAGNOSIS — F17210 Nicotine dependence, cigarettes, uncomplicated: Secondary | ICD-10-CM | POA: Insufficient documentation

## 2024-02-15 DIAGNOSIS — Z23 Encounter for immunization: Secondary | ICD-10-CM | POA: Diagnosis not present

## 2024-02-15 DIAGNOSIS — Z0001 Encounter for general adult medical examination with abnormal findings: Secondary | ICD-10-CM | POA: Diagnosis not present

## 2024-02-15 DIAGNOSIS — R079 Chest pain, unspecified: Secondary | ICD-10-CM | POA: Diagnosis present

## 2024-02-15 DIAGNOSIS — R000 Tachycardia, unspecified: Secondary | ICD-10-CM | POA: Diagnosis not present

## 2024-02-15 DIAGNOSIS — R052 Subacute cough: Secondary | ICD-10-CM | POA: Diagnosis not present

## 2024-02-15 DIAGNOSIS — R9431 Abnormal electrocardiogram [ECG] [EKG]: Secondary | ICD-10-CM

## 2024-02-15 DIAGNOSIS — C44722 Squamous cell carcinoma of skin of right lower limb, including hip: Secondary | ICD-10-CM | POA: Insufficient documentation

## 2024-02-15 DIAGNOSIS — R5382 Chronic fatigue, unspecified: Secondary | ICD-10-CM | POA: Diagnosis not present

## 2024-02-15 LAB — LIPID PANEL
Cholesterol: 264 mg/dL — ABNORMAL HIGH (ref 0–200)
HDL: 52.2 mg/dL (ref >39.00)
LDL Cholesterol: 173 mg/dL — ABNORMAL HIGH (ref 0–99)
NonHDL: 211.63
Total CHOL/HDL Ratio: 5
Triglycerides: 194 mg/dL — ABNORMAL HIGH (ref 0.0–149.0)
VLDL: 38.8 mg/dL (ref 0.0–40.0)

## 2024-02-15 LAB — URINALYSIS, ROUTINE W REFLEX MICROSCOPIC
Hgb urine dipstick: NEGATIVE
Ketones, ur: NEGATIVE
Leukocytes,Ua: NEGATIVE
Nitrite: NEGATIVE
Specific Gravity, Urine: 1.025 (ref 1.000–1.030)
Urine Glucose: >=1000 — AB
Urobilinogen, UA: 0.2 (ref 0.0–1.0)
pH: 6 (ref 5.0–8.0)

## 2024-02-15 LAB — CBC WITH DIFFERENTIAL/PLATELET
Basophils Absolute: 0.1 K/uL (ref 0.0–0.1)
Basophils Relative: 0.7 % (ref 0.0–3.0)
Eosinophils Absolute: 0.1 K/uL (ref 0.0–0.7)
Eosinophils Relative: 1.2 % (ref 0.0–5.0)
HCT: 48 % — ABNORMAL HIGH (ref 36.0–46.0)
Hemoglobin: 15.9 g/dL — ABNORMAL HIGH (ref 12.0–15.0)
Lymphocytes Relative: 16.5 % (ref 12.0–46.0)
Lymphs Abs: 1.5 K/uL (ref 0.7–4.0)
MCHC: 33.2 g/dL (ref 30.0–36.0)
MCV: 91 fl (ref 78.0–100.0)
Monocytes Absolute: 0.5 K/uL (ref 0.1–1.0)
Monocytes Relative: 5.9 % (ref 3.0–12.0)
Neutro Abs: 7 K/uL (ref 1.4–7.7)
Neutrophils Relative %: 75.7 % (ref 43.0–77.0)
Platelets: 282 K/uL (ref 150.0–400.0)
RBC: 5.28 Mil/uL — ABNORMAL HIGH (ref 3.87–5.11)
RDW: 14.8 % (ref 11.5–15.5)
WBC: 9.2 K/uL (ref 4.0–10.5)

## 2024-02-15 LAB — D-DIMER, QUANTITATIVE: D-Dimer, Quant: 0.65 ug{FEU}/mL — ABNORMAL HIGH (ref <0.50)

## 2024-02-15 LAB — BASIC METABOLIC PANEL WITH GFR
Anion gap: 18 — ABNORMAL HIGH (ref 5–15)
BUN: 13 mg/dL (ref 6–23)
BUN: 12 mg/dL (ref 6–20)
CO2: 29 meq/L (ref 19–32)
CO2: 23 mmol/L (ref 22–32)
Calcium: 9.6 mg/dL (ref 8.4–10.5)
Calcium: 10.7 mg/dL — ABNORMAL HIGH (ref 8.9–10.3)
Chloride: 100 meq/L (ref 96–112)
Chloride: 100 mmol/L (ref 98–111)
Creatinine, Ser: 1.08 mg/dL (ref 0.40–1.20)
Creatinine, Ser: 1.1 mg/dL — ABNORMAL HIGH (ref 0.44–1.00)
GFR, Estimated: >60 mL/min (ref >60)
GFR: 60.29 mL/min (ref >60.00)
Glucose, Bld: 103 mg/dL — ABNORMAL HIGH (ref 70–99)
Glucose, Bld: 117 mg/dL — ABNORMAL HIGH (ref 70–99)
Potassium: 3 meq/L — ABNORMAL LOW (ref 3.5–5.1)
Potassium: 3.1 mmol/L — ABNORMAL LOW (ref 3.5–5.1)
Sodium: 140 meq/L (ref 135–145)
Sodium: 141 mmol/L (ref 135–145)

## 2024-02-15 LAB — CBC
HCT: 46.9 % — ABNORMAL HIGH (ref 36.0–46.0)
Hemoglobin: 16.1 g/dL — ABNORMAL HIGH (ref 12.0–15.0)
MCH: 30.2 pg (ref 26.0–34.0)
MCHC: 34.3 g/dL (ref 30.0–36.0)
MCV: 88 fL (ref 80.0–100.0)
Platelets: 179 K/uL (ref 150–400)
RBC: 5.33 MIL/uL — ABNORMAL HIGH (ref 3.87–5.11)
RDW: 14.9 % (ref 11.5–15.5)
WBC: 10 K/uL (ref 4.0–10.5)
nRBC: 0 % (ref 0.0–0.2)

## 2024-02-15 LAB — HEPATIC FUNCTION PANEL
ALT: 23 U/L (ref 0–35)
AST: 19 U/L (ref 0–37)
Albumin: 4.5 g/dL (ref 3.5–5.2)
Alkaline Phosphatase: 87 U/L (ref 39–117)
Bilirubin, Direct: 0.1 mg/dL (ref 0.0–0.3)
Total Bilirubin: 0.8 mg/dL (ref 0.2–1.2)
Total Protein: 7.7 g/dL (ref 6.0–8.3)

## 2024-02-15 LAB — TROPONIN T, HIGH SENSITIVITY: Troponin T High Sensitivity: <15 ng/L (ref 0–19)

## 2024-02-15 LAB — MAGNESIUM: Magnesium: 2.2 mg/dL (ref 1.7–2.4)

## 2024-02-15 LAB — TSH: TSH: 3.45 u[IU]/mL (ref 0.35–5.50)

## 2024-02-15 LAB — BRAIN NATRIURETIC PEPTIDE: Pro B Natriuretic peptide (BNP): 6 pg/mL (ref 0.0–100.0)

## 2024-02-15 LAB — PREGNANCY, URINE: Preg Test, Ur: NEGATIVE

## 2024-02-15 LAB — TROPONIN I (HIGH SENSITIVITY): High Sens Troponin I: 48 ng/L (ref 2–17)

## 2024-02-15 MED ORDER — IOHEXOL 350 MG/ML SOLN
100.0000 mL | Freq: Once | INTRAVENOUS | Status: AC | PRN
  Administered 2024-02-15: 80 mL via INTRAVENOUS

## 2024-02-15 MED ORDER — POTASSIUM CHLORIDE CRYS ER 20 MEQ PO TBCR
40.0000 meq | EXTENDED_RELEASE_TABLET | Freq: Once | ORAL | Status: AC
  Administered 2024-02-15: 40 meq via ORAL
  Filled 2024-02-15: qty 2

## 2024-02-15 MED ORDER — SODIUM CHLORIDE 0.9 % IV BOLUS
500.0000 mL | Freq: Once | INTRAVENOUS | Status: AC
  Administered 2024-02-15: 500 mL via INTRAVENOUS

## 2024-02-15 MED ORDER — CEFPODOXIME PROXETIL 200 MG PO TABS
200.0000 mg | ORAL_TABLET | Freq: Two times a day (BID) | ORAL | 0 refills | Status: AC
  Filled 2024-02-15 – 2024-02-27 (×2): qty 14, 7d supply, fill #0

## 2024-02-15 NOTE — Progress Notes (Signed)
 Subjective:  Patient ID: [name], female    DOB: [birth year]  Age: 48 y.o. MRN: [id number]  CC: Annual Exam   HPI [name] presents for a CPX and f/up ---  Discussed the use of AI scribe software for clinical note transcription with the patient, who gave verbal consent to proceed.  History of Present Illness [name] is a 48 year old female with Lynch syndrome who presents with concerns about a skin lesion and new onset chest pain.  She has a skin lesion on her right leg, initially thought to be an insect bite, which she let go for a year. The lesion began scaling and growing, prompting her to seek evaluation due to her history of Lynch syndrome. Initially, the lesion was treated as an infection with doxycycline  for a month without improvement. The lesion was excised under the suspicion of a cyst, but it recurred the next day. She is concerned about further surgical interventions.  She reports new onset left-sided chest pain for the past two weeks, described as sharp and exacerbated by deep breathing. The pain sometimes resolves after taking a deep breath. No breast tenderness or swelling, and her recent mammogram was normal. She also experiences fatigue, often falling asleep immediately after returning from work, which her husband has noticed.  She has been experiencing a cough with brown phlegm for about two months, without any associated fever, chills, or night sweats. She recalls having a fever in June when she saw another doctor.  She has a history of irritable bowel syndrome (IBS) with diarrhea as the predominant symptom. No blood in her stool.     Outpatient Medications Prior to Visit  Medication Sig Dispense Refill   empagliflozin  (JARDIANCE ) 10 MG TABS tablet Take 1 tablet (10 mg total) by mouth daily. 30 tablet 5   estradiol  (VIVELLE -DOT) 0.1 MG/24HR patch Place 1 patch (0.1 mg total) onto the skin 2 (two) times a week. 24 patch 0    gabapentin  (NEURONTIN ) 100 MG capsule Take 1 capsule (100 mg total) by mouth at bedtime. 30 capsule 3   Potassium Chloride  ER 20 MEQ TBCR Take 20 mEq by mouth 2 (two) times daily. 180 tablet 0   sacubitril -valsartan  (ENTRESTO ) 24-26 MG Take 1 tablet by mouth 2 (two) times daily. 60 tablet 1   Semaglutide -Weight Management (WEGOVY ) 0.25 MG/0.5ML SOAJ Inject 0.25 mg into the skin once a week. 2 mL 1   torsemide  (DEMADEX ) 10 MG tablet Take 3 tablets (30 mg total) by mouth daily. 270 tablet 3   Ubrogepant  (UBRELVY ) 100 MG TABS Take 1 tablet (100 mg total) by mouth daily as needed. 30 tablet 1   valACYclovir (VALTREX) 500 MG tablet Take 500 mg by mouth as needed.     aspirin  EC 325 MG tablet Take 1 tablet (325 mg total) by mouth daily. (Patient not taking: Reported on 06/27/2023) 30 tablet 0   cephALEXin  (KEFLEX ) 500 MG capsule Take 1 capsule (500 mg total) by mouth 3 (three) times daily. 21 capsule 0   doxycycline  (VIBRAMYCIN ) 100 MG capsule Take 1 capsule (100 mg total) by mouth 2 (two) times daily with food and water to avoid stomach upset 60 capsule 0   ergocalciferol  (VITAMIN D2) 1.25 MG (50000 UT) capsule Take 1 capsule (50,000 Units total) by mouth once a week. 12 capsule 3   estradiol  (VIVELLE -DOT) 0.1 MG/24HR patch Place 1 patch (0.1 mg total) onto the skin 2 (two) times a week. 24 patch 3  fluconazole  (DIFLUCAN ) 150 MG tablet Take 1 tablet by mouth every 3 days as needed 2 tablet 1   No facility-administered medications prior to visit.    ROS Review of Systems  Constitutional:  Positive for fatigue. Negative for appetite change, chills, diaphoresis, fever and unexpected weight change.  HENT: Negative.  Negative for sore throat and trouble swallowing.   Eyes: Negative.   Respiratory:  Positive for cough. Negative for chest tightness, shortness of breath and wheezing.   Cardiovascular:  Positive for chest pain. Negative for palpitations and leg swelling.  Gastrointestinal:  Positive for  diarrhea. Negative for abdominal pain, blood in stool, constipation, nausea and rectal pain.  Endocrine: Negative.  Negative for cold intolerance and heat intolerance.  Genitourinary: Negative.  Negative for difficulty urinating.  Musculoskeletal:  Positive for arthralgias. Negative for gait problem and myalgias.  Skin: Negative.  Negative for color change and pallor.  Neurological: Negative.  Negative for dizziness, weakness, light-headedness and numbness.  Hematological:  Negative for adenopathy. Does not bruise/bleed easily.  Psychiatric/Behavioral: Negative.      Objective:  BP 114/86 (BP Location: Left Arm, Patient Position: Sitting, Cuff Size: Normal)   Pulse 97   Temp 98.7 F (37.1 C) (Oral)   Ht 5' 3 (1.6 m)   Wt 186 lb 6.4 oz (84.6 kg)   LMP 01/12/2012   SpO2 97%   BMI 33.02 kg/m   BP Readings from Last 3 Encounters:  02/15/24 119/79  02/15/24 114/86  01/11/24 122/87    Wt Readings from Last 3 Encounters:  02/15/24 186 lb 6.4 oz (84.6 kg)  01/11/24 187 lb 9.6 oz (85.1 kg)  12/25/23 188 lb (85.3 kg)    Physical Exam Vitals reviewed.  Constitutional:      General: She is not in acute distress.    Appearance: She is not ill-appearing, toxic-appearing or diaphoretic.  HENT:     Mouth/Throat:     Mouth: Mucous membranes are moist.  Eyes:     General: No scleral icterus.    Conjunctiva/sclera: Conjunctivae normal.  Cardiovascular:     Rate and Rhythm: Normal rate and regular rhythm.     Pulses: Normal pulses.     Heart sounds: Normal heart sounds, S1 normal and S2 normal. No murmur heard.    No friction rub. No gallop.     Comments: EKG-- NSR, 89 bpm Minimal LVH NS ST segment changes - new No Q waves Pulmonary:     Effort: Pulmonary effort is normal.     Breath sounds: No stridor. No wheezing, rhonchi or rales.  Abdominal:     General: Abdomen is flat.     Palpations: There is no mass.     Tenderness: There is no abdominal tenderness. There is no  guarding.     Hernia: No hernia is present.  Musculoskeletal:        General: No swelling or tenderness.     Cervical back: Neck supple.     Right lower leg: No edema.     Left lower leg: No edema.  Skin:    General: Skin is warm and dry.     Findings: Lesion present. No rash.  Neurological:     General: No focal deficit present.     Mental Status: She is alert. Mental status is at baseline.  Psychiatric:        Mood and Affect: Mood normal.        Behavior: Behavior normal.     Lab Results  Component  Value Date   WBC 10.0 02/15/2024   HGB 16.1 (H) 02/15/2024   HCT 46.9 (H) 02/15/2024   PLT 179 02/15/2024   GLUCOSE 117 (H) 02/15/2024   CHOL 264 (H) 02/15/2024   TRIG 194.0 (H) 02/15/2024   HDL 52.20 02/15/2024   LDLCALC 173 (H) 02/15/2024   ALT 23 02/15/2024   AST 19 02/15/2024   NA 141 02/15/2024   K 3.1 (L) 02/15/2024   CL 100 02/15/2024   CREATININE 1.10 (H) 02/15/2024   BUN 12 02/15/2024   CO2 23 02/15/2024   TSH 3.45 02/15/2024   INR 2.3 02/05/2019   HGBA1C 5.7 10/27/2022    MR Knee Right Wo Contrast Result Date: 11/28/2023 MR KNEE WITHOUT IV CONTRAST RIGHT COMPARISON: None. CLINICAL HISTORY: Meniscal injury. Chronic knee pain. PULSE SEQUENCES: Ax PD FS, Sag T2 ACL, Sag PD FS, Cor PD FS & COR T1 FINDINGS: Bones: There is no fracture or contusion pattern. No accelerated degenerative arthrosis is present. The extensor mechanism is intact. No significant joint effusion is present. Trace Baker's cyst. Ligaments: The ACL, PCL, MCL and fibular collateral ligament are intact. Menisci: Medial and lateral menisci are intact. IMPRESSION: Trace joint effusion and Baker's cyst. Otherwise, unremarkable MRI of the right knee. Electronically signed by: Norleen Satchel MD 11/28/2023 10:30 AM EDT RP Workstation: [id number]        A.  SKIN, RIGHT LEG, EXCISION: SQUAMOUS CELL CARCINOMA, WITH FEATURES OF KERATOACANTHOMA, FOCALLY INVOLVING LATERAL MARGIN.   Electronically signed by  Jon Ronnald Nephew, MD on 02/09/2024 at 1537 EDT    CT Angio Chest PE W/Cm &/Or Wo Cm Result Date: 02/15/2024 CLINICAL DATA:  Shortness of breath.  Positive the D-dimer. EXAM: CT ANGIOGRAPHY CHEST WITH CONTRAST TECHNIQUE: Multidetector CT imaging of the chest was performed using the standard protocol during bolus administration of intravenous contrast. Multiplanar CT image reconstructions and MIPs were obtained to evaluate the vascular anatomy. RADIATION DOSE REDUCTION: This exam was performed according to the departmental dose-optimization program which includes automated exposure control, adjustment of the mA and/or kV according to patient size and/or use of iterative reconstruction technique. CONTRAST:  80mL OMNIPAQUE  IOHEXOL  350 MG/ML SOLN COMPARISON:  CT of the chest 11/02/2018. CT abdomen and pelvis 09/10/2021. FINDINGS: Cardiovascular: Satisfactory opacification of the pulmonary arteries to the segmental level. No evidence of pulmonary embolism. Normal heart size. No pericardial effusion. Mediastinum/Nodes: No enlarged mediastinal, hilar, or axillary lymph nodes. Thyroid  gland, trachea, and esophagus demonstrate no significant findings. Lungs/Pleura: There are bands of atelectasis in the right lower lobe and right middle lobe. There is an 11 mm nodular density in the right lung base (previously measuring 9 mm in 2023). The lungs are otherwise clear. There is no pleural effusion or pneumothorax. Upper Abdomen: No acute abnormality. Hepatic and renal cysts are present. Musculoskeletal: No chest wall abnormality. No acute or significant osseous findings. Review of the MIP images confirms the above findings. IMPRESSION: 1. No evidence for pulmonary embolism. 2. 11 mm right solid pulmonary nodule, mildly increased compared to 2023. Findings are indeterminate. Consider one of the following in 3 months for both low-risk and high-risk individuals: (a) repeat chest CT, (b) follow-up PET-CT, or (c) tissue sampling.  This recommendation follows the consensus statement: Guidelines for Management of Incidental Pulmonary Nodules Detected on CT Images: From the Fleischner Society 2017; Radiology 2017; 284:228-243. Electronically Signed   By: Greig Pique M.D.   On: 02/15/2024 17:21   DG Chest 2 View Result Date: 02/15/2024 CLINICAL DATA:  Cough. EXAM:  CHEST - 2 VIEW COMPARISON:  12/24/2021. FINDINGS: The heart size and mediastinal contours are unchanged. Mitral valve replacement. No focal consolidation, pleural effusion, or pneumothorax. No acute osseous abnormality. IMPRESSION: No acute cardiopulmonary findings. Electronically Signed   By: Harrietta Sherry M.D.   On: 02/15/2024 10:35     Assessment & Plan:  Pleuritic chest pain -     DG Chest 2 View; Future -     EKG 12-Lead -     Brain natriuretic peptide; Future -     Troponin I (High Sensitivity); Future -     D-dimer, quantitative; Future  Subacute cough -     DG Chest 2 View; Future  Abnormal electrocardiogram (ECG) (EKG) -     Brain natriuretic peptide; Future -     Troponin I (High Sensitivity); Future -     D-dimer, quantitative; Future  Chronic fatigue -     Basic metabolic panel with GFR; Future -     TSH; Future -     Hepatic function panel; Future  Other specified hypothyroidism- She is euthyroid. -     Lipid panel; Future -     TSH; Future  Viral illness -     Urinalysis, Routine w reflex microscopic -     CBC with Differential/Platelet  LRTI (lower respiratory tract infection) -     Cefpodoxime  Proxetil; Take 1 tablet (200 mg total) by mouth 2 (two) times daily for 7 days.  Dispense: 14 tablet; Refill: 0  Immunization due -     Pneumococcal conjugate vaccine 20-valent  Encounter for general adult medical examination with abnormal findings- Exam completed, labs reviewed, vaccines reviewed and updated, cancer screenings are UTD, pt ed material was given.   SCC (squamous cell carcinoma), leg, right -     Ambulatory referral to  Dermatology     Follow-up: Return in about 6 months (around 08/17/2024).  Debby Molt, MD

## 2024-02-15 NOTE — ED Notes (Signed)
 PT requested to use restroom before EKG could be obtained

## 2024-02-15 NOTE — Patient Instructions (Signed)

## 2024-02-15 NOTE — ED Provider Notes (Signed)
 [place] EMERGENCY DEPARTMENT AT [facility] Provider Note   CSN: [id number] Arrival date & time: 02/15/24  1329     Patient presents with: Abnormal Lab (Elevated trop)   [name] is a 48 y.o. female.    Abnormal Lab   48 year old female presents emergency department with complaints of abnormal lab/EKG.  States that her primary care took labs earlier this morning and had no abnormal troponin prompting visit to the ED.  Patient states over the past week and 1/2 to 2 weeks, has had sharp left-sided chest pain.  States that it typically last for a few minutes at a time worsened with taking a deep breath and she does feel short of breath when this happens.  Has happened twice over the past 2 weeks and resolved spontaneously.  Currently asymptomatic at this time.  States that she is also cough for the past 2 months.  States that she has noticed brown phlegm.  States it is not necessarily getting worse or better but is concerned about this as well.  Denies any fevers, chills, abdominal pain, nausea, vomiting.  Past medical history significant for migraine, allergic rhinitis, GERD, hyperlipidemia, hypertension, Lynch syndrome, MVP, mitral regurg, CHF  Prior to Admission medications   Medication Sig Start Date End Date Taking? Authorizing Provider  cefpodoxime  (VANTIN ) 200 MG tablet Take 1 tablet (200 mg total) by mouth 2 (two) times daily for 7 days. 02/15/24 02/22/24  Joshua Debby CROME, MD  empagliflozin  (JARDIANCE ) 10 MG TABS tablet Take 1 tablet (10 mg total) by mouth daily. 01/11/24   Lavona Agent, MD  estradiol  (VIVELLE -DOT) 0.1 MG/24HR patch Place 1 patch (0.1 mg total) onto the skin 2 (two) times a week. 10/23/23     gabapentin  (NEURONTIN ) 100 MG capsule Take 1 capsule (100 mg total) by mouth at bedtime. 01/22/24   Curlene Agent, MD  Potassium Chloride  ER 20 MEQ TBCR Take 20 mEq by mouth 2 (two) times daily. 05/31/21 02/15/24  Lavona Agent, MD  sacubitril -valsartan   (ENTRESTO ) 24-26 MG Take 1 tablet by mouth 2 (two) times daily. 01/11/24   Lavona Agent, MD  Semaglutide -Weight Management (WEGOVY ) 0.25 MG/0.5ML SOAJ Inject 0.25 mg into the skin once a week. 07/31/23   Webb, Padonda B, FNP  torsemide  (DEMADEX ) 10 MG tablet Take 3 tablets (30 mg total) by mouth daily. 06/05/23   Lavona Agent, MD  Ubrogepant  (UBRELVY ) 100 MG TABS Take 1 tablet (100 mg total) by mouth daily as needed. 10/27/22   Joshua Debby CROME, MD  valACYclovir (VALTREX) 500 MG tablet Take 500 mg by mouth as needed. 06/18/21   [provider]    Allergies: Bee venom    Review of Systems  All other systems reviewed and are negative.   Updated Vital Signs BP 116/86 (BP Location: Right Arm)   Pulse (!) 115   Temp 98.6 F (37 C) (Oral)   Resp 18   LMP 01/12/2012   SpO2 99%   Physical Exam Vitals and nursing note reviewed.  Constitutional:      General: She is not in acute distress.    Appearance: She is well-developed.  HENT:     Head: Normocephalic and atraumatic.  Eyes:     Conjunctiva/sclera: Conjunctivae normal.  Cardiovascular:     Rate and Rhythm: Regular rhythm. Tachycardia present.  Pulmonary:     Effort: Pulmonary effort is normal. No respiratory distress.     Breath sounds: Normal breath sounds. No wheezing, rhonchi or rales.  Chest:  Chest wall: No tenderness.  Abdominal:     Palpations: Abdomen is soft.     Tenderness: There is no abdominal tenderness.  Musculoskeletal:        General: No swelling.     Cervical back: Neck supple.     Right lower leg: No edema.     Left lower leg: No edema.  Skin:    General: Skin is warm and dry.     Capillary Refill: Capillary refill takes less than 2 seconds.  Neurological:     Mental Status: She is alert.  Psychiatric:        Mood and Affect: Mood normal.     (all labs ordered are listed, but only abnormal results are displayed) Labs Reviewed  BASIC METABOLIC PANEL WITH GFR - Abnormal; Notable for the  following components:      Result Value   Potassium 3.1 (*)    Glucose, Bld 117 (*)    Creatinine, Ser 1.10 (*)    Calcium 10.7 (*)    Anion gap 18 (*)    All other components within normal limits  CBC - Abnormal; Notable for the following components:   RBC 5.33 (*)    Hemoglobin 16.1 (*)    HCT 46.9 (*)    All other components within normal limits  PREGNANCY, URINE  MAGNESIUM   TROPONIN T, HIGH SENSITIVITY    EKG: EKG Interpretation Date/Time:  Thursday February 15 2024 13:46:52 EDT Ventricular Rate:  116 PR Interval:  138 QRS Duration:  88 QT Interval:  330 QTC Calculation: 458 R Axis:   58  Text Interpretation: Sinus tachycardia Right atrial enlargement Left ventricular hypertrophy with repolarization abnormality ( Sokolow-Lyon ) Abnormal ECG When compared with ECG of 26-May-2023 08:22, Vent. rate has increased BY  48 BPM diffuse ST depression, may be rate related Confirmed by Armenta Canning [phone]) on 02/15/2024 2:26:25 PM  Radiology: DG Chest 2 View Result Date: 02/15/2024 CLINICAL DATA:  Cough. EXAM: CHEST - 2 VIEW COMPARISON:  12/24/2021. FINDINGS: The heart size and mediastinal contours are unchanged. Mitral valve replacement. No focal consolidation, pleural effusion, or pneumothorax. No acute osseous abnormality. IMPRESSION: No acute cardiopulmonary findings. Electronically Signed   By: Harrietta Sherry M.D.   On: 02/15/2024 10:35     Procedures   Medications Ordered in the ED  sodium chloride  0.9 % bolus 500 mL (has no administration in time range)  potassium chloride  SA (KLOR-CON  M) CR tablet 40 mEq (has no administration in time range)                                    Medical Decision Making Amount and/or Complexity of Data Reviewed Labs: ordered. Radiology: ordered.  Risk Prescription drug management.   This patient presents to the ED for concern of chest pain, this involves an extensive number of treatment options, and is a complaint that carries  with it a high risk of complications and morbidity.  The differential diagnosis includes ACS, PE, pneumothorax, MSK, GERD, pericarditis/myocarditis/tamponade, aortic dissection, other   Co morbidities that complicate the patient evaluation  See HPI   Additional history obtained:  Additional history obtained from EMR External records from outside source obtained and reviewed including hospital records   Lab Tests:  I Ordered, and personally interpreted labs.  The pertinent results include: Outpatient D-dimer of 0.65.  No leukocytosis.  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  Platelets within range.  Hypokalemia  3.1, hypercalcemia of 10.7 otherwise, electrolytes within normal limits.  No renal dysfunction.  Anion gap of 18.  Urine pregnancy negative.  Magnesium  2.2.   Imaging Studies ordered:  I ordered imaging studies including CT PE study  I independently visualized and interpreted imaging which showed no PE or other acute cardiopulmonary abnormality.  11 mm right upper lobe pulmonary nodule.  Increased in size from 2023 I agree with the radiologist interpretation   Cardiac Monitoring: / EKG:  The patient was maintained on a cardiac monitor.  I personally viewed and interpreted the cardiac monitored which showed an underlying rhythm of: Sinus tachycardia, LVH, right atrial enlargement, diffuse ST depression with sinus tachycardia rate of 116.  Consultations Obtained:  N/a   Problem List / ED Course / Critical interventions / Medication management  Left chest pain, abnormal lab, hypokalemia I ordered medication including potassium chloride , normal saline   Reevaluation of the patient after these medicines showed that the patient improved I have reviewed the patients home medicines and have made adjustments as needed   Social Determinants of Health:  Cigarette use.  Denies illicit drug use.   Test / Admission - Considered:  Left chest pain, abnormal lab, hypokalemia,  pulmonary nodule Vitals signs significant for initial tachycardia heart rate of 116's of which returned within normal range with, labs and medicines administered while in emergency department.. Otherwise within normal range and stable throughout visit. Laboratory/imaging studies significant for: See above  48 year old female presents emergency department with complaints of abnormal lab/EKG.  States that her primary care took labs earlier this morning and had no abnormal troponin prompting visit to the ED.  Patient states over the past week and 1/2 to 2 weeks, has had sharp left-sided chest pain.  States that it typically last for a few minutes at a time worsened with taking a deep breath and she does feel short of breath when this happens.  Has happened twice over the past 2 weeks and resolved spontaneously.  Currently asymptomatic at this time.  States that she is also cough for the past 2 months.  States that she has noticed brown phlegm.  States it is not necessarily getting worse or better but is concerned about this as well.  Denies any fevers, chills, abdominal pain, nausea, vomiting. On exam, lungs clear to auscultation bilaterally.  No chest wall tenderness.  No abdominal tenderness.  Patient asymptomatic at this time.  Given positive troponin as well as positive D-dimer in outpatient setting this morning, repeat troponin was obtained which was negative; low suspicion for ACS..  CT PE study was negative for PE or other acute cardiopulmonary abnormality.  Did show evidence of slightly increased right upper pulmonary nodule which will recommend follow-up outpatient with primary care provider for further assessment/evaluation.  Patient without chest pain rating to back, pulse deficits, neurodeficits, hypertension; low suspicion for aortic dissection; no evidence of dissection on CT PE study.  Patient's chest pain somewhat fleeting lasting for few minutes at a time twice over the past week and 1/2 to 2  weeks; could be MSK etiology.  Regarding changes in EKG, will recommend follow-up with cardiology.  Treatment plan discussed with patient and she acknowledged understanding was agreeable to said plan.  Patient over well-appearing, afebrile in no acute distress. Worrisome signs and symptoms were discussed with the patient, and the patient acknowledged understanding to return to the ED if noticed. Patient was stable upon discharge.       Final diagnoses:  None  ED Discharge Orders     None          Silver Wonda LABOR, [place] 02/15/24 1742    Armenta Canning, MD 02/21/24 [phone]

## 2024-02-15 NOTE — ED Notes (Signed)
 Pt discharged home and given discharge paperwork. Opportunities given for questions. Pt verbalizes understanding. PIV removed x1. Bethena Powell SAUNDERS , RN

## 2024-02-15 NOTE — Telephone Encounter (Signed)
 CRITICAL VALUE STICKER  CRITICAL VALUE: Troponin 48  RECEIVER (on-site recipient of call): Dionisio Aragones  DATE & TIME NOTIFIED: 08/14 1155am  MESSENGER (representative from lab): Saa  MD NOTIFIED: Dr.Jones  TIME OF NOTIFICATION: 1156am  RESPONSE:  MD notified

## 2024-02-15 NOTE — ED Triage Notes (Signed)
 Pt reports:  Sent from [facility] Abnormal EKG Elevated troponin levels Pt has no other complaints No CP or SOB

## 2024-02-15 NOTE — Telephone Encounter (Signed)
 Called patient 3x, finally able to reach her. Advised her to go to the ED per PCP due to elevated troponin. Patient gave verbal understanding.

## 2024-02-15 NOTE — Discharge Instructions (Signed)
 Your workup today was reassuring.  Your heart enzymes today in the emergency department was negative.  CT scan of your chest did not show evidence of blood clot in the lung.  You do have a pulmonary nodule in your right upper lung that is slightly increased in size since 2023.  Recommend repeat chest imaging by primary care in the outpatient setting for further assessment regarding this area.  Please do not hesitate to return to the emergency department if the worrisome signs and symptoms discussed become apparent.

## 2024-02-17 ENCOUNTER — Other Ambulatory Visit: Payer: Self-pay | Admitting: Internal Medicine

## 2024-02-17 ENCOUNTER — Other Ambulatory Visit (HOSPITAL_BASED_OUTPATIENT_CLINIC_OR_DEPARTMENT_OTHER): Payer: Self-pay

## 2024-02-17 DIAGNOSIS — E876 Hypokalemia: Secondary | ICD-10-CM

## 2024-02-17 MED ORDER — POTASSIUM CHLORIDE ER 10 MEQ PO TBCR
10.0000 meq | EXTENDED_RELEASE_TABLET | Freq: Three times a day (TID) | ORAL | 0 refills | Status: AC
  Filled 2024-02-17 – 2024-02-27 (×2): qty 90, 30d supply, fill #0

## 2024-02-18 ENCOUNTER — Ambulatory Visit: Payer: Self-pay | Admitting: Internal Medicine

## 2024-02-20 ENCOUNTER — Encounter: Payer: Self-pay | Admitting: Dermatology

## 2024-02-20 ENCOUNTER — Ambulatory Visit: Admitting: Dermatology

## 2024-02-20 VITALS — BP 123/80 | HR 93

## 2024-02-20 DIAGNOSIS — D239 Other benign neoplasm of skin, unspecified: Secondary | ICD-10-CM

## 2024-02-20 DIAGNOSIS — C4492 Squamous cell carcinoma of skin, unspecified: Secondary | ICD-10-CM

## 2024-02-20 DIAGNOSIS — D235 Other benign neoplasm of skin of trunk: Secondary | ICD-10-CM | POA: Diagnosis not present

## 2024-02-20 DIAGNOSIS — C44722 Squamous cell carcinoma of skin of right lower limb, including hip: Secondary | ICD-10-CM

## 2024-02-20 NOTE — Progress Notes (Signed)
 New Patient Visit   Subjective  [name] is a 48 y.o. female who presents for the following: SCC on the right leg   Patient states that it started September 2024 and was seen by her dermatologist that determined that it was benign and likely a spider bite. In June 2025 it started to evolve and raise/ rupture. One morning she woke up with a pain shooting down her leg. She was prescribed doxycycline  for 30 days. When she returned she was given another 14 day course of antibiotics and referred to a general surgeon for the removal of what was believed to be a cyst.   Patient was seen by Geni Wanda Alexander, PA-C on 02/07/2024 at which time a SCC was excised from the right leg with margin involvement.   The patient has spots, moles and lesions to be evaluated, some may be new or changing and the patient may have concern these could be cancer.   The following portions of the chart were reviewed this encounter and updated as appropriate: medications, allergies, medical history  Review of Systems:  No other skin or systemic complaints except as noted in HPI or Assessment and Plan.  Objective  Well appearing patient in no apparent distress; mood and affect are within normal limits.   A focused examination was performed of the following areas: Right leg  Relevant exam findings are noted in the Assessment and Plan.  right leg Pink healing scar  Right Buttock Firm pink/brown papulenodule with dimple sign   Assessment & Plan   Squamous Cell Carcinoma- KA type, with positive margins s/p WLE Exam: right leg  Treatment Plan: Mohs to be scheduled  The patient was counseled thoroughly regarding the plan for Mohs micrographic surgery to treat their skin cancer. We discussed the procedure in detail, including the removal of thin layers of tissue for examination under a microscope to ensure complete excision of the cancer while preserving as much healthy tissue as possible. The  patient was informed that the surgery may take several hours, with potential additional stages if further tissue removal is necessary to achieve clear margins. We reviewed the risks, which include infection, scarring, and the possibility of requiring reconstructive techniques depending on the location and size of the defect. Reconstruction will occur after the tumor is fully cleared, and depending on the wound size and location, this may involve a flap, graft, linear closure, or sometimes healing by secondary intention. The patient was advised on preoperative and postoperative care, including avoiding certain medications, managing pain, and caring for the surgical site to prevent complications. They were given an opportunity to ask questions, and all concerns were addressed. The patient expressed understanding and agreed to proceed with the scheduled Mohs surgery.  DERMATOFIBROMA- right buttock Exam: Firm pink/brown papulenodule with dimple sign. Treatment Plan: A dermatofibroma is a benign growth possibly related to trauma, such as an insect bite, cut from shaving, or inflamed acne-type bump.  Treatment options to remove include shave or excision with resulting scar and risk of recurrence.  Since benign-appearing and not bothersome, will observe for now.   SQUAMOUS CELL CARCINOMA OF SKIN right leg Mohs to be scheduled DERMATOFIBROMA Right Buttock Monitor.   Return for Mohs right leg T4/ FBSE with Erminio.  LILLETTE Rollene Gobble, RN, am acting as scribe for [name], MD .   Documentation: I have reviewed the above documentation for accuracy and completeness, and I agree with the above.  [name], MD

## 2024-02-20 NOTE — Progress Notes (Signed)
   New Patient Visit   Subjective  [name] is a 48 y.o. female who presents for the following: growth    The following portions of the chart were reviewed this encounter and updated as appropriate: medications, allergies, medical history  Review of Systems:  No other skin or systemic complaints except as noted in HPI or Assessment and Plan.  Objective  Well appearing patient in no apparent distress; mood and affect are within normal limits.  A focused examination was performed of the following areas: Right thigh  Relevant exam findings are noted in the Assessment and Plan.    Assessment & Plan       No follow-ups on file.  I, Darice Smock, CMA, am acting as scribe for [name], MD.   Documentation: I have reviewed the above documentation for accuracy and completeness, and I agree with the above.  [name], MD

## 2024-02-20 NOTE — Patient Instructions (Signed)

## 2024-02-22 ENCOUNTER — Ambulatory Visit: Attending: Cardiology | Admitting: Pharmacist

## 2024-02-22 ENCOUNTER — Encounter: Payer: Self-pay | Admitting: Pharmacist

## 2024-02-22 ENCOUNTER — Other Ambulatory Visit (HOSPITAL_BASED_OUTPATIENT_CLINIC_OR_DEPARTMENT_OTHER): Payer: Self-pay

## 2024-02-22 VITALS — BP 108/82 | HR 110 | Wt 189.8 lb

## 2024-02-22 DIAGNOSIS — I420 Dilated cardiomyopathy: Secondary | ICD-10-CM | POA: Diagnosis not present

## 2024-02-22 DIAGNOSIS — I5032 Chronic diastolic (congestive) heart failure: Secondary | ICD-10-CM | POA: Diagnosis not present

## 2024-02-22 MED ORDER — METOPROLOL SUCCINATE ER 25 MG PO TB24
25.0000 mg | ORAL_TABLET | Freq: Every day | ORAL | 1 refills | Status: AC
  Filled 2024-02-22 – 2024-02-26 (×2): qty 30, 30d supply, fill #0
  Filled 2024-03-23 – 2024-04-17 (×4): qty 30, 30d supply, fill #1
  Filled 2024-05-15: qty 30, 30d supply, fill #2
  Filled 2024-06-14 – 2024-08-05 (×6): qty 30, 30d supply, fill #3

## 2024-02-22 NOTE — Progress Notes (Signed)
 Patient ID: [name]                 DOB: 05/20/1976                      MRN: [id number]     HPI: [name] is a 48 y.o. female referred by Dr. Lavona to HTN clinic. PMH is significant for MR, mitral valve prolapse, HTN, and CHF.   At first visit on 09/12/23 she was only taking torsemide  30mg  daily. Recommended she switch to PRN and started Entresto  24-26mg  BID.  At second visit on 7/10, Jardiance  10mg  was added.  Patient presents today for next follow up visit. Seen by Dr Joshua on 02/15/24 and described left sided chest pain. Labs were drawn and troponin was elevated. Patient was sent to ED and labs were repeated. Troponin was normal a few hours later and patient was discharged.  Having Mohs procedure next week for squamous cell carcinoma on right leg.  Went to [place] week and did not eat like she typically does. Foods at restaurants were salty and her legs began swelling again. Relieved with torsemide .   Does not appear fluid overloaded today. Now on torsemide  twice weekly.  Would like to have echocardiogram repeated.   Current HTN meds: Entresto  24-26mg  BID Jardiance  10mg  daily Torsemide  30mg  PRN    BP goal: <130/80  Wt Readings from Last 3 Encounters:  06/27/23 186 lb (84.4 kg)  06/06/23 187 lb (84.8 kg)  05/26/23 189 lb (85.7 kg)   BP Readings from Last 3 Encounters:  06/27/23 124/88  05/26/23 118/80  05/04/23 122/85   Pulse Readings from Last 3 Encounters:  06/27/23 68  05/26/23 76  05/04/23 73    Renal function: CrCl cannot be calculated (Patient's most recent lab result is older than the maximum 21 days allowed.).  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 (Patient not taking: Reported on 06/27/2023) 30 tablet 0   estradiol  (VIVELLE -DOT) 0.1 MG/24HR patch Place 1 patch (0.1 mg total) onto the skin 2 (two) times a week. 24 patch 3   estradiol  (VIVELLE -DOT) 0.1 MG/24HR patch Place 1 patch (0.1 mg total) onto the skin 2 (two) times a week. 8 patch 6   meloxicam  (MOBIC ) 15 MG tablet Take 1 tablet (15 mg total) by mouth daily. (Patient not taking: Reported on 06/27/2023) 7 tablet 0   ondansetron  (ZOFRAN ) 4 MG tablet Take 1 tablet (4 mg total) by mouth as directed. Take 1 tablet 45 min - 1 hour prior to drinking each dose of your prep (Patient not taking: Reported on 06/27/2023) 2 tablet 0  Potassium Chloride  ER 20 MEQ TBCR Take 20 mEq by mouth 2 (two) times daily. 180 tablet 0   Semaglutide -Weight Management (WEGOVY ) 0.25 MG/0.5ML SOAJ Inject 0.25 mg into the skin once a week. 2 mL 1   torsemide  (DEMADEX ) 10 MG tablet Take 3 tablets (30 mg total) by mouth daily. 270 tablet 3   traMADol  (ULTRAM ) 50 MG tablet Take 1 tablet (50 mg total) by mouth every 6 (six) hours as needed. 30 tablet 1   Ubrogepant  (UBRELVY ) 100 MG TABS Take 1 tablet (100 mg total) by mouth daily as needed. (Patient not taking: Reported on 06/27/2023) 30 tablet 1   valACYclovir (VALTREX) 500 MG tablet Take 500 mg by mouth as needed.     No current facility-administered medications on file prior to visit.    Allergies  Allergen Reactions   Bee Venom Swelling and Rash     Assessment/Plan:  1. Hypertension -  Patient BP in room 108/82 today.  Pulse rate consistently  elevated. Will have patient start Toprol  25mg  daily. Report back any symptoms of hypotension and dose can be reduced.  Sent message to scheduling to organize appointment with Dr Lavona.    Continue Entresto  24-26mg  BID Continue Jardiance  10mg  daily Continue torsemide  30mg  PRN Start metoprolol  succinate 25mg  daily  Chris Chayse Gracey, PharmD, [place], CDCES, CPP [facility] [address], [place], [place] [address] Phone: [phone]; Fax: [phone] 02/22/2024 2:45 PM

## 2024-02-22 NOTE — Patient Instructions (Addendum)
 It was good seeing you again  Your blood pressure looks good!  Please continue your Entresto  24-26mg  twice a day, Jardiance  10mg  once a day in the morning, and torsemide  as needed  We will restart metoprolol  at 25mg  once a day. If you notice symptoms of low blood pressure, please let us  know and we can reduce the dosage  I have sent a request to get you scheduled with Dr Lavona  Medford Bolk, PharmD, BCACP, CDCES, CPP [facility] [address], [place], [place] [address] Phone: [phone]; Fax: [phone] 02/22/2024 3:07 PM

## 2024-02-23 ENCOUNTER — Encounter: Payer: Self-pay | Admitting: Student

## 2024-02-23 ENCOUNTER — Ambulatory Visit (INDEPENDENT_AMBULATORY_CARE_PROVIDER_SITE_OTHER): Admitting: Student

## 2024-02-23 VITALS — BP 102/78 | HR 115 | Temp 98.5°F | Resp 18 | Ht 63.0 in | Wt 189.0 lb

## 2024-02-23 DIAGNOSIS — E876 Hypokalemia: Secondary | ICD-10-CM | POA: Diagnosis not present

## 2024-02-23 DIAGNOSIS — R1909 Other intra-abdominal and pelvic swelling, mass and lump: Secondary | ICD-10-CM | POA: Insufficient documentation

## 2024-02-23 NOTE — Progress Notes (Signed)
   Acute Office Visit  Subjective:     Patient ID: [name], female    DOB: [birth year], 48 y.o.   MRN: [id number]  Chief Complaint  Patient presents with   Groin Pain    Pt states having some pain n swelling right side.     HPI  Patient presents today for an acute visit due to a mass in the left groin, noted 2 days ago after experiencing pain while in the shower. She is scheduled for a Mohs procedure on her left lower leg next Monday.  Seen by dermatology this past Wednesday, and dermatology was concerned with proceeding with Mohs r/t new onset right groin mass.    Pt reports having had a recent bikini wax and feels this may be related. She has used a new lotion and soap within the past week. [place] beach [place] and noticed mass while in the shower after getting home from beach. Dermatology prescribed Keflex , which she has not yet started but plans to pick up today. Patient is married and denies any new sexual partners. Denies gynecologic symptoms.  No additional symptoms reported.  PMHx- lynch syndrome, Hx adenomatous colon polyps.  Patient denies fever, chills, SOB, CP, palpitations, dyspnea, edema, HA, vision changes, N/V/D, abdominal pain, urinary symptoms, rash, weight changes, and recent illness or hospitalizations.     ROS See HPI     Objective:    BP 102/78 (BP Location: Left Arm, Patient Position: Sitting, Cuff Size: Large)   Pulse (!) 115   Temp 98.5 F (36.9 C) (Oral)   Resp 18   Ht 5' 3 (1.6 m)   Wt 189 lb (85.7 kg)   LMP 01/12/2012   SpO2 97%   BMI 33.48 kg/m    Physical Exam  General: No acute distress. Awake and conversant.  Eyes: Normal conjunctiva, anicteric. Round symmetric pupils.  Respiratory: CTAB. Respirations are non-labored. No wheezing.  Skin: Warm. No rashes or ulcers.  Psych: Alert and oriented. Cooperative, Appropriate mood and affect, Normal judgment.  CV: RRR. No murmur. No lower extremity edema.   MSK: Normal ambulation. No clubbing or cyanosis. +small mass right groin, mobile,no erythema, no warmth, TWP. Neuro:  CN II-XII grossly normal.    No results found for any visits on 02/23/24.      Assessment & Plan:   Problem List Items Addressed This Visit     Left groin mass - Primary   Relevant Orders   US  PELVIS (TRANSABDOMINAL ONLY)   Other Visit Diagnoses       Hypokalemia         Start Keflex  abx US  ordered, results pending. Continue to monitor. if rapidly enlarging or fever, systemic symptoms return to seek care.  Portions of this note were dictated using DRAGON voice recognition software. Please disregard any errors in transcription.      No orders of the defined types were placed in this encounter.   No follow-ups on file.  Marycruz Boehner L Lakeisha Waldrop, NP

## 2024-02-23 NOTE — Addendum Note (Signed)
 Addended by: ELOUISE POWELL HERO on: 02/23/2024 04:39 PM   Modules accepted: Orders

## 2024-02-24 ENCOUNTER — Ambulatory Visit (HOSPITAL_BASED_OUTPATIENT_CLINIC_OR_DEPARTMENT_OTHER)
Admission: RE | Admit: 2024-02-24 | Discharge: 2024-02-24 | Disposition: A | Discharge status: Home / Self Care | Source: Ambulatory Visit | Attending: Student | Admitting: Student

## 2024-02-24 DIAGNOSIS — R1909 Other intra-abdominal and pelvic swelling, mass and lump: Secondary | ICD-10-CM | POA: Diagnosis present

## 2024-02-26 ENCOUNTER — Other Ambulatory Visit (HOSPITAL_BASED_OUTPATIENT_CLINIC_OR_DEPARTMENT_OTHER): Payer: Self-pay

## 2024-02-26 ENCOUNTER — Ambulatory Visit (INDEPENDENT_AMBULATORY_CARE_PROVIDER_SITE_OTHER): Admitting: Dermatology

## 2024-02-26 ENCOUNTER — Encounter: Payer: Self-pay | Admitting: Dermatology

## 2024-02-26 ENCOUNTER — Other Ambulatory Visit: Payer: Self-pay

## 2024-02-26 VITALS — BP 114/77 | HR 94 | Temp 98.4°F

## 2024-02-26 DIAGNOSIS — W908XXA Exposure to other nonionizing radiation, initial encounter: Secondary | ICD-10-CM

## 2024-02-26 DIAGNOSIS — L578 Other skin changes due to chronic exposure to nonionizing radiation: Secondary | ICD-10-CM | POA: Diagnosis not present

## 2024-02-26 DIAGNOSIS — L814 Other melanin hyperpigmentation: Secondary | ICD-10-CM

## 2024-02-26 DIAGNOSIS — C4492 Squamous cell carcinoma of skin, unspecified: Secondary | ICD-10-CM

## 2024-02-26 DIAGNOSIS — C44722 Squamous cell carcinoma of skin of right lower limb, including hip: Secondary | ICD-10-CM | POA: Diagnosis not present

## 2024-02-26 MED ORDER — OXYCODONE HCL 5 MG PO TABS
5.0000 mg | ORAL_TABLET | Freq: Four times a day (QID) | ORAL | 0 refills | Status: DC | PRN
  Filled 2024-02-26: qty 8, 2d supply, fill #0

## 2024-02-26 MED ORDER — MUPIROCIN 2 % EX OINT
1.0000 | TOPICAL_OINTMENT | Freq: Two times a day (BID) | CUTANEOUS | 3 refills | Status: DC
  Filled 2024-02-26: qty 22, 11d supply, fill #0
  Filled 2024-03-01: qty 22, 11d supply, fill #1

## 2024-02-26 NOTE — Patient Instructions (Signed)

## 2024-02-26 NOTE — Progress Notes (Signed)
 Follow-Up Visit   Subjective  [name] is a 48 y.o. female who presents for the following: Mohs for a recurrent SCC KA type on the right leg. The lesion was previously treated with an incomplete excision on 02/07/2024. Patient was seen in our office on 02/20/2024 at which time the treatment plan of Mohs Surgery was determined.   The following portions of the chart were reviewed this encounter and updated as appropriate: medications, allergies, medical history  Review of Systems:  No other skin or systemic complaints except as noted in HPI or Assessment and Plan.  Objective  Well appearing patient in no apparent distress; mood and affect are within normal limits.  A focused examination was performed of the following areas: Right leg Relevant physical exam findings are noted in the Assessment and Plan.   right leg Hyperkeratotic nodule   Assessment & Plan   SQUAMOUS CELL CARCINOMA OF SKIN right leg Mohs surgery  Consent obtained: written  Anticoagulation: Is the patient taking prescription anticoagulant and/or aspirin  prescribed/recommended by a physician? No   Was the anticoagulation regimen changed prior to Mohs? No    Anesthesia: Anesthesia method: local infiltration Local anesthetic: lidocaine  1% WITH epi  Procedure Details: Timeout: pre-procedure verification complete Procedure Prep: patient was prepped and draped in usual sterile fashion Prep type: chlorhexidine  Biopsy accession number: [id number] Biopsy lab: [facility] Date of biopsy: 02/07/2024 Frozen section biopsy performed: No   Specimen debulked: No   Pre-Op diagnosis: squamous cell carcinoma SCC subtype: KA type MohsAIQ Surgical site (if tumor spans multiple areas, please select predominant area): lower limb (including hip) Surgery side: right Surgical site (from skin exam): right leg Pre-operative length (cm): 1.6 Pre-operative width (cm): 1 Indications for  Mohs surgery: anatomic location where tissue conservation is critical, recurrence and positive margins after excision Previously treated? Yes   Previous treatment type: excision  Micrographic Surgery Details: Post-operative length (cm): 3.1 Post-operative width (cm): 2.5 Number of Mohs stages: 2 Cumulative additional sections past 5 per stage: 0 Post surgery depth of defect: subcutaneous fat Is this a complex case (associate members only): Yes    Stage 1    Tumor features identified on Mohs section: squamous cell carcinoma    Depth of tumor invasion after stage: subcutaneous fat    Perineural invasion: no perineural invasion  Stage 2    Tumor features identified on Mohs section: no tumor identified    Depth of tumor invasion after stage: subcutaneous fat    Perineural invasion: no perineural invasion  Patient tolerance of procedure: tolerated well, no immediate complications  Reconstruction: Was the defect reconstructed?: No    Opioids: Did the patient receive a prescription for opioid/narcotic related to Mohs surgery? Yes   Indications for opioid/narcotics: patient required additional pain relief despite trial of non-opioid analgesia  Antibiotics: Does patient meet AHA guidelines for endocarditis?: No   Does patient meet AHA guidelines for orthopedic prophylaxis?: No   Were antibiotics given on the day of surgery? Yes   When were antibiotics given? post-operative Did surgery breach mucosa, expose cartilage/bone, involve an area of lymphedema/inflamed/infected tissue? No   Indication for post-operative antibiotics: anatomic location  Related Medications oxyCODONE  (OXY IR/ROXICODONE ) 5 MG immediate release tablet Take 1 tablet (5 mg total) by mouth every 6 (six) hours as needed for up to 8 doses.   Return in about 4 weeks (around 03/25/2024) for wound check.  LILLETTE Rollene Gobble, RN, am acting as Neurosurgeon for [employer]  Kasie Leccese, MD .   02/26/2024  HISTORY OF PRESENT  ILLNESS  [name] is seen in consultation at the request of Dr. Debby Molt for a positive margin Squamous Carcinoma on the right thigh. They note that the area has been present for about 1 year increasing in size with time.  There is no history of previous treatment.  Reports no other new or changing lesions and has no other complaints today.  Medications and allergies: see patient chart.  Review of systems: Reviewed 8 systems and notable for the above skin cancer.  All other systems reviewed are unremarkable/negative, unless noted in the HPI. Past medical history, surgical history, family history, social history were also reviewed and are noted in the chart/questionnaire.    PHYSICAL EXAMINATION  General: Well-appearing, in no acute distress, alert and oriented x 4. Vitals reviewed in chart (if available).   Skin: Exam reveals a 1.6 x 1.0 cm erythematous papule and biopsy scar on the right thigh. There are rhytids, telangiectasias, and lentigines, consistent with photodamage.  Biopsy report(s) reviewed, confirming the diagnosis.   ASSESSMENT  1) Positive Margin Squamous Carcinoma KA type on the right thigh 2) photodamage 3) solar lentigines   PLAN   1. Due to location, size, histology, or recurrence and the likelihood of subclinical extension as well as the need to conserve normal surrounding tissue, the patient was deemed acceptable for Mohs micrographic surgery (MMS).  The nature and purpose of the procedure, associated benefits and risks including recurrence and scarring, possible complications such as pain, infection, and bleeding, and alternative methods of treatment if appropriate were discussed with the patient during consent. The lesion location was verified by the patient, by reviewing previous notes, pathology reports, and by photographs as well as angulation measurements if available.  Informed consent was reviewed and signed by the patient, and timeout was  performed at 8:15 AM. See op note below.  2. For the photodamage and solar lentigines, sun protection discussed/information given on OTC sunscreens, and we recommend continued regular follow-up with primary dermatologist every 6 months or sooner for any growing, bleeding, or changing lesions. 3. Prognosis and future surveillance discussed. 4. Letter with treatment outcome sent to referring provider. 5. Pain acetaminophen /oxycodone  5 mg  MOHS MICROGRAPHIC SURGERY AND RECONSTRUCTION  Initial size:   1.6 x 1.0 cm Surgical defect/wound size: 3.1 x 2.5 cm Anesthesia:    0.33% lidocaine  with 1:200,000 epinephrine  EBL:    <5 mL Complications:  None Repair type:   Second Intention   Stages: 2  STAGE I: Anesthesia achieved with 0.5% lidocaine  with 1:200,000 epinephrine . ChloraPrep applied. 1 section(s) excised using Mohs technique (this includes total peripheral and deep tissue margin excision and evaluation with frozen sections, excised and interpreted by the same physician). The tumor was first debulked and then excised with an approx. 2 mm margin.  Hemostasis was achieved with electrocautery as needed.  The specimen was then oriented, subdivided/relaxed, inked, and processed using Mohs technique.    Frozen section analysis revealed a positive margin for atypical epithelial cells with squamous differentiation in the dermis in the deep margin.    STAGE II: An additional 2 mm margin was excised.  Hemostasis was achieved with electrocautery as needed.  The specimen was then oriented, subdivided/relaxed, inked, and processed using Mohs technique. Evaluation of slides by the Mohs surgeon revealed clear tumor margins.  Reconstruction  Patient was notified of results and repair options were discussed, including second intention healing. After reviewing the advantages and disadvantages  of each, we agreed on second intention healing as appropriate.   The surgical site was then lightly scrubbed with  sterile, saline-soaked gauze.  The area was bandaged using Vaseline ointment, non-adherent gauze, gauze pads, and tape to provide an adequate pressure dressing.   The patient tolerated the procedure well, was given detailed written and verbal wound care instructions, and was discharged in good condition.  The patient will follow-up in 4 weeks and as scheduled with primary dermatologist.    Documentation: I have reviewed the above documentation for accuracy and completeness, and I agree with the above.  [name], MD

## 2024-02-27 ENCOUNTER — Other Ambulatory Visit (HOSPITAL_BASED_OUTPATIENT_CLINIC_OR_DEPARTMENT_OTHER): Payer: Self-pay

## 2024-02-27 ENCOUNTER — Ambulatory Visit: Payer: Self-pay | Admitting: Student

## 2024-02-29 ENCOUNTER — Other Ambulatory Visit (HOSPITAL_BASED_OUTPATIENT_CLINIC_OR_DEPARTMENT_OTHER): Payer: Self-pay

## 2024-03-01 ENCOUNTER — Other Ambulatory Visit (HOSPITAL_BASED_OUTPATIENT_CLINIC_OR_DEPARTMENT_OTHER): Payer: Self-pay

## 2024-03-05 ENCOUNTER — Encounter: Payer: Self-pay | Admitting: Dermatology

## 2024-03-06 ENCOUNTER — Other Ambulatory Visit: Payer: Self-pay | Admitting: Dermatology

## 2024-03-06 ENCOUNTER — Other Ambulatory Visit (HOSPITAL_BASED_OUTPATIENT_CLINIC_OR_DEPARTMENT_OTHER): Payer: Self-pay

## 2024-03-06 ENCOUNTER — Telehealth: Payer: Self-pay

## 2024-03-06 MED ORDER — MUPIROCIN 2 % EX OINT
1.0000 | TOPICAL_OINTMENT | Freq: Two times a day (BID) | CUTANEOUS | 3 refills | Status: DC
  Filled 2024-03-06: qty 22, 11d supply, fill #0

## 2024-03-06 NOTE — Progress Notes (Signed)
 Patient ran out of meds so I sent in a new rx with a larger quantity

## 2024-03-06 NOTE — Telephone Encounter (Signed)
 Patient advised that there are 3 refills on her mupirocin  rx and to contact the pharmacy for refills. She also asked if her insurance would approve her for the allograft. I advised that her insurance wound not.

## 2024-03-11 ENCOUNTER — Encounter: Payer: Self-pay | Admitting: Internal Medicine

## 2024-03-12 ENCOUNTER — Other Ambulatory Visit: Payer: Self-pay | Admitting: Internal Medicine

## 2024-03-12 DIAGNOSIS — R911 Solitary pulmonary nodule: Secondary | ICD-10-CM

## 2024-03-12 DIAGNOSIS — C44722 Squamous cell carcinoma of skin of right lower limb, including hip: Secondary | ICD-10-CM

## 2024-03-13 ENCOUNTER — Other Ambulatory Visit: Payer: Self-pay | Admitting: Internal Medicine

## 2024-03-13 DIAGNOSIS — R911 Solitary pulmonary nodule: Secondary | ICD-10-CM

## 2024-03-13 DIAGNOSIS — C44722 Squamous cell carcinoma of skin of right lower limb, including hip: Secondary | ICD-10-CM

## 2024-03-19 ENCOUNTER — Encounter (HOSPITAL_COMMUNITY)
Admission: RE | Admit: 2024-03-19 | Discharge: 2024-03-19 | Disposition: A | Discharge status: Home / Self Care | Source: Ambulatory Visit | Attending: Internal Medicine | Admitting: Internal Medicine

## 2024-03-19 DIAGNOSIS — C44722 Squamous cell carcinoma of skin of right lower limb, including hip: Secondary | ICD-10-CM | POA: Insufficient documentation

## 2024-03-19 DIAGNOSIS — R911 Solitary pulmonary nodule: Secondary | ICD-10-CM | POA: Diagnosis present

## 2024-03-19 LAB — GLUCOSE, CAPILLARY: Glucose-Capillary: 104 mg/dL — ABNORMAL HIGH (ref 70–99)

## 2024-03-19 MED ORDER — FLUDEOXYGLUCOSE F - 18 (FDG) INJECTION
9.4100 | Freq: Once | INTRAVENOUS | Status: AC
Start: 2024-03-19 — End: 2024-03-19
  Administered 2024-03-19: 9.41 via INTRAVENOUS

## 2024-03-23 ENCOUNTER — Other Ambulatory Visit (HOSPITAL_BASED_OUTPATIENT_CLINIC_OR_DEPARTMENT_OTHER): Payer: Self-pay

## 2024-03-23 ENCOUNTER — Other Ambulatory Visit: Payer: Self-pay

## 2024-03-24 ENCOUNTER — Ambulatory Visit: Payer: Self-pay | Admitting: Internal Medicine

## 2024-03-24 ENCOUNTER — Other Ambulatory Visit: Payer: Self-pay | Admitting: Internal Medicine

## 2024-03-24 DIAGNOSIS — E041 Nontoxic single thyroid nodule: Secondary | ICD-10-CM | POA: Insufficient documentation

## 2024-03-25 ENCOUNTER — Encounter: Payer: Self-pay | Admitting: Internal Medicine

## 2024-03-26 ENCOUNTER — Other Ambulatory Visit: Payer: Self-pay | Admitting: Internal Medicine

## 2024-03-26 ENCOUNTER — Encounter: Payer: Self-pay | Admitting: Dermatology

## 2024-03-26 ENCOUNTER — Inpatient Hospital Stay
Admission: RE | Admit: 2024-03-26 | Discharge: 2024-03-26 | Discharge status: Home / Self Care | Source: Ambulatory Visit | Attending: Internal Medicine | Admitting: Internal Medicine

## 2024-03-26 ENCOUNTER — Ambulatory Visit (INDEPENDENT_AMBULATORY_CARE_PROVIDER_SITE_OTHER): Admitting: Dermatology

## 2024-03-26 ENCOUNTER — Ambulatory Visit: Payer: Self-pay | Admitting: Internal Medicine

## 2024-03-26 DIAGNOSIS — E041 Nontoxic single thyroid nodule: Secondary | ICD-10-CM | POA: Insufficient documentation

## 2024-03-26 DIAGNOSIS — C4492 Squamous cell carcinoma of skin, unspecified: Secondary | ICD-10-CM

## 2024-03-26 DIAGNOSIS — Z85828 Personal history of other malignant neoplasm of skin: Secondary | ICD-10-CM

## 2024-03-26 DIAGNOSIS — Z08 Encounter for follow-up examination after completed treatment for malignant neoplasm: Secondary | ICD-10-CM | POA: Diagnosis not present

## 2024-03-26 DIAGNOSIS — T1490XD Injury, unspecified, subsequent encounter: Secondary | ICD-10-CM

## 2024-03-26 NOTE — Progress Notes (Signed)
   Follow Up Visit   Subjective  [name] is a 48 y.o. female who presents for the following: follow up from Mohs surgery   The patient presents for follow up from Mohs surgery for a SCC on the right leg, treated on 02/26/24, healing by second intention. The patient has been bandaging the wound as directed. The endorse the following concerns: No questions or concerns at this time.  The following portions of the chart were reviewed this encounter and updated as appropriate: medications, allergies, medical history  Review of Systems:  No other skin or systemic complaints except as noted in HPI or Assessment and Plan.  Objective  Well appearing patient in no apparent distress; mood and affect are within normal limits.  A focal examination was performed including scalp, head, face and right leg. All findings within normal limits unless otherwise noted below.  Healing wound with mild erythema  Relevant physical exam findings are noted in the Assessment and Plan.    Assessment & Plan   Healing Wound s/p Mohs for SCC, treated on 02/26/24, repaired with second intention - Reassured that wound is healing well - No evidence of infection - No swelling, induration, purulence, dehiscence, or tenderness out of proportion to the clinical exam, see photo above - Discussed that scars take up to 12 months to mature from the date of surgery - Recommend SPF 30+ to scar daily to prevent purple color from UV exposure during scar maturation process - Discussed that erythema and raised appearance of scar will fade over the next 4-6 months - OK to start scar massage at 4-6 weeks post-op - Can consider silicone based products for scar healing starting at 6 weeks post-op - Ok to continue ointment daily to wound under a bandage for another 2 weeks or until completely healed   HISTORY OF SQUAMOUS CELL CARCINOMA OF THE SKIN - No evidence of recurrence today - No lymphadenopathy - Recommend regular  full body skin exams - Recommend daily broad spectrum sunscreen SPF 30+ to sun-exposed areas, reapply every 2 hours as needed.  - Call if any new or changing lesions are noted between office visits   Return in about 4 weeks (around 04/23/2024) for Wound check.  I, Berwyn Lesches, Surg Tech III, am acting as scribe for [name], MD.   Documentation: I have reviewed the above documentation for accuracy and completeness, and I agree with the above.  [name], MD

## 2024-03-26 NOTE — Patient Instructions (Signed)

## 2024-03-27 ENCOUNTER — Ambulatory Visit (HOSPITAL_COMMUNITY)

## 2024-03-27 ENCOUNTER — Other Ambulatory Visit: Payer: Self-pay | Admitting: Family

## 2024-03-27 ENCOUNTER — Ambulatory Visit: Admitting: Physician Assistant

## 2024-03-28 ENCOUNTER — Other Ambulatory Visit (HOSPITAL_BASED_OUTPATIENT_CLINIC_OR_DEPARTMENT_OTHER): Payer: Self-pay

## 2024-03-28 ENCOUNTER — Other Ambulatory Visit: Payer: Self-pay | Admitting: Internal Medicine

## 2024-03-28 ENCOUNTER — Encounter: Payer: Self-pay | Admitting: Internal Medicine

## 2024-03-28 DIAGNOSIS — G43109 Migraine with aura, not intractable, without status migrainosus: Secondary | ICD-10-CM

## 2024-03-28 MED ORDER — UBRELVY 100 MG PO TABS
100.0000 mg | ORAL_TABLET | Freq: Every day | ORAL | 1 refills | Status: AC | PRN
  Filled 2024-03-28: qty 30, 30d supply, fill #0

## 2024-03-28 NOTE — Telephone Encounter (Signed)
 Copied from CRM #[id number]. Topic: General - Other >> Mar 28, 2024  9:22 AM Rosina BIRCH wrote: Reason for CRM: patient called stating she want more information on the referral for [facility] [phone]

## 2024-03-29 ENCOUNTER — Ambulatory Visit (INDEPENDENT_AMBULATORY_CARE_PROVIDER_SITE_OTHER)

## 2024-03-29 ENCOUNTER — Encounter (HOSPITAL_BASED_OUTPATIENT_CLINIC_OR_DEPARTMENT_OTHER): Payer: Self-pay | Admitting: Orthopaedic Surgery

## 2024-03-29 ENCOUNTER — Other Ambulatory Visit (HOSPITAL_BASED_OUTPATIENT_CLINIC_OR_DEPARTMENT_OTHER): Payer: Self-pay

## 2024-03-29 ENCOUNTER — Ambulatory Visit (INDEPENDENT_AMBULATORY_CARE_PROVIDER_SITE_OTHER): Admitting: Student

## 2024-03-29 DIAGNOSIS — M25562 Pain in left knee: Secondary | ICD-10-CM

## 2024-03-29 MED ORDER — MELOXICAM 15 MG PO TABS
15.0000 mg | ORAL_TABLET | Freq: Every day | ORAL | 0 refills | Status: AC
  Filled 2024-03-29: qty 14, 14d supply, fill #0

## 2024-03-29 NOTE — Progress Notes (Signed)
 Post Operative Evaluation    Procedure/Date of Surgery: Left knee MACI  and tibial tubercle osteotomy 12/20/22  Interval History:   Patient presents today for evaluation of left knee pain and swelling.  She underwent a Maci  procedure and tibial tubercle osteotomy over 1 year ago.  She states that she has been doing very well although yesterday her knee became swollen and painful without any recent cause or injury.  Denies any popping or grinding sensations.  Pain is worse when weightbearing.   PMH/PSH/Family History/Social History/Meds/Allergies:    Past Medical History:  Diagnosis Date   Adenomatous colon polyp 12/2012   Allergic rhinitis    Arthritis    Cellulitis    Common migraine    Diastolic dysfunction    Dysrhythmia    External hemorrhoid    Genital herpes    GERD (gastroesophageal reflux disease)    Hx of migraines    Hyperlipidemia    Hypertension    Lynch syndrome    Mitral regurgitation    MVP (mitral valve prolapse)    Plica syndrome of left knee    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   32 mm Sorin Memo 4D ring annuloplasty via right mini thoracotomy approach   Squamous cell carcinoma of skin    Past Surgical History:  Procedure Laterality Date   ABDOMINAL HYSTERECTOMY     CHOLECYSTECTOMY N/A 01/16/2020   Procedure: LAPAROSCOPIC CHOLECYSTECTOMY WITH INTRAOPERATIVE CHOLANGIOGRAM;  Surgeon: Eletha Boas, MD;  Location: [facility];  Service: General;  Laterality: N/A;   COLONOSCOPY     KNEE ARTHROSCOPY WITH MACI  CARTILAGE HARVEST Left 10/04/2022   Procedure: LEFT KNEE ARTHROSCOPY WITH MACI  CARTILAGE HARVEST;  Surgeon: Genelle Standing, MD;  Location: [facility];  Service: Orthopedics;  Laterality: Left;   KNEE ARTHROSCOPY WITH MEDIAL MENISECTOMY Left 06/07/2013   Procedure: LEFT KNEE ARTHROSCOPY WITH SYNOVECTOMY LIMITED, ARTHROSCOPY KNEE WITH DEBRIDEMENT/SHAVING (CHONDROPLASTY), ARTHROSCOPY KNEE   ChondroplastyWITH MEDIAL AND LATERAL  MENISECTOMY;  Surgeon: Evalene JONETTA Chancy, MD;  Location: [facility];  Service: Orthopedics;  Laterality: Left;   KNEE ARTHROSCOPY WITH MEDIAL PATELLAR FEMORAL LIGAMENT RECONSTRUCTION Left 12/20/2022   Procedure: KNEE ARTHROSCOPY WITH MEDIAL PATELLAR FEMORAL LIGAMENT RECONSTRUCTION;  Surgeon: Genelle Standing, MD;  Location: [facility];  Service: Orthopedics;  Laterality: Left;   left knee arthroscopy     MITRAL VALVE REPAIR Right 11/28/2018   Procedure: MINIMALLY INVASIVE MITRAL VALVE REPAIR (MVR) - Using Memo 4D Ring Size 32;  Surgeon: Dusty Sudie DEL, MD;  Location: [facility];  Service: Open Heart Surgery;  Laterality: Right;   patrial hysterectomy     POLYPECTOMY     SALPINGOOPHORECTOMY  2022   SYNOVECTOMY Left 06/07/2013   Procedure: SYNOVECTOMY;  Surgeon: Evalene JONETTA Chancy, MD;  Location: [facility];  Service: Orthopedics;  Laterality: Left;   TEE WITHOUT CARDIOVERSION  10/11/2011   Procedure: TRANSESOPHAGEAL ECHOCARDIOGRAM (TEE);  Surgeon: Maude JAYSON Emmer, MD;  Location: [facility];  Service: Cardiovascular;  Laterality: N/A;   TEE WITHOUT CARDIOVERSION N/A 11/28/2018   Procedure: TRANSESOPHAGEAL ECHOCARDIOGRAM (TEE);  Surgeon: Dusty Sudie DEL, MD;  Location: [facility];  Service: Open Heart Surgery;  Laterality: N/A;   TUBAL LIGATION     WISDOM TOOTH EXTRACTION     Social History   Socioeconomic History   Marital  status: Married    Spouse name: Selinda   Number of children: 2   Years of education: Not on file   Highest education level: Master's degree (e.g., MA, MS, MEng, MEd, MSW, MBA)  Occupational History   Occupation: patient account rep    Employer: [employer]  Tobacco Use   Smoking status: Some Days    Current packs/day: 0.00    Average packs/day: 0.5 packs/day for 12.0 years (6.0 ttl pk-yrs)    Types: Cigars, Cigarettes    Start date: 11/29/1998    Last attempt to quit: 11/29/2010    Years  since quitting: 13.3   Smokeless tobacco: Never  Vaping Use   Vaping status: Never Used  Substance and Sexual Activity   Alcohol use: Yes    Alcohol/week: 2.0 standard drinks of alcohol    Types: 2 Glasses of wine per week    Comment: social   Drug use: No   Sexual activity: Yes    Birth control/protection: Surgical  Other Topics Concern   Not on file  Social History Narrative   Not on file   Social Drivers of Health   Financial Resource Strain: Low Risk  (01/23/2023)   Overall Financial Resource Strain (CARDIA)    Difficulty of Paying Living Expenses: Not very hard  Food Insecurity: No Food Insecurity (01/23/2023)   Hunger Vital Sign    Worried About Running Out of Food in the Last Year: Never true    Ran Out of Food in the Last Year: Never true  Transportation Needs: No Transportation Needs (01/23/2023)   PRAPARE - Administrator, Civil Service (Medical): No    Lack of Transportation (Non-Medical): No  Physical Activity: Unknown (01/23/2023)   Exercise Vital Sign    Days of Exercise per Week: 0 days    Minutes of Exercise per Session: Not on file  Stress: No Stress Concern Present (01/23/2023)   [employer] of Occupational Health - Occupational Stress Questionnaire    Feeling of Stress : Not at all  Social Connections: Socially Integrated (01/23/2023)   Social Connection and Isolation Panel    Frequency of Communication with Friends and Family: More than three times a week    Frequency of Social Gatherings with Friends and Family: More than three times a week    Attends Religious Services: More than 4 times per year    Active Member of Clubs or Organizations: Yes    Attends Engineer, structural: More than 4 times per year    Marital Status: Married   Family History  Problem Relation Age of Onset   Other Mother        lynch syndrome   Colon cancer Father 31   Colon polyps Maternal Uncle    Prostate cancer Maternal Uncle 64   Colon cancer  Paternal Aunt 30   Colon cancer Paternal Uncle 31   Colon cancer Paternal Uncle 8   Multiple myeloma Maternal Grandmother 68   Parkinsonism Paternal Grandfather    Other Daughter        Lynch syndrome   Breast cancer Cousin        lynch syndrome    Breast cancer Other        Paternal Great Grandmother   Esophageal cancer Neg Hx    Rectal cancer Neg Hx    Stomach cancer Neg Hx    Allergies  Allergen Reactions   Bee Venom Swelling and Rash   Current Outpatient Medications  Medication  Sig Dispense Refill   meloxicam  (MOBIC ) 15 MG tablet Take 1 tablet (15 mg total) by mouth daily for 14 days. 14 tablet 0   empagliflozin  (JARDIANCE ) 10 MG TABS tablet Take 1 tablet (10 mg total) by mouth daily. 30 tablet 5   estradiol  (VIVELLE -DOT) 0.1 MG/24HR patch Place 1 patch (0.1 mg total) onto the skin 2 (two) times a week. 24 patch 0   gabapentin  (NEURONTIN ) 100 MG capsule Take 1 capsule (100 mg total) by mouth at bedtime. 30 capsule 3   metoprolol  succinate (TOPROL  XL) 25 MG 24 hr tablet Take 1 tablet (25 mg total) by mouth daily. 90 tablet 1   mupirocin  ointment (BACTROBAN ) 2 % Apply 1 Application topically 2 (two) times daily. 22 g 3   oxyCODONE  (OXY IR/ROXICODONE ) 5 MG immediate release tablet Take 1 tablet (5 mg total) by mouth every 6 (six) hours as needed for up to 8 doses. 8 tablet 0   potassium chloride  (KLOR-CON  10) 10 MEQ tablet Take 1 tablet (10 mEq total) by mouth 3 (three) times daily. 270 tablet 0   sacubitril -valsartan  (ENTRESTO ) 24-26 MG Take 1 tablet by mouth 2 (two) times daily. 60 tablet 1   Semaglutide -Weight Management (WEGOVY ) 0.25 MG/0.5ML SOAJ Inject 0.25 mg into the skin once a week. 2 mL 1   torsemide  (DEMADEX ) 10 MG tablet Take 3 tablets (30 mg total) by mouth daily. 270 tablet 3   Ubrogepant  (UBRELVY ) 100 MG TABS Take 1 tablet (100 mg total) by mouth daily as needed. 30 tablet 1   valACYclovir (VALTREX) 500 MG tablet Take 500 mg by mouth as needed.     No current  facility-administered medications for this visit.   No results found.  Review of Systems:   A ROS was performed including pertinent positives and negatives as documented in the HPI.   Musculoskeletal Exam:    Exam of the left knee demonstrates prior incisions are well-healed.  Mild effusion is present with some tenderness over the lateral joint line.  Active range of motion from 0 to 120 degrees.  No laxity with varus or valgus stress.  Imaging:   Xray (left knee 4 views): Negative for acute abnormality.  Prior TTO screws in good positioning without evidence of damage or loosening.  Mild to moderate joint space loss within the patellofemoral compartment.    I personally reviewed and interpreted the radiographs.   Assessment:   Patient with a history of left knee Maci  implantation and tibial tubercle osteotomy presenting with acute left knee pain and swelling.  No recent injury or overuse.  X-rays are reassuring without evidence of acute abnormality or hardware complication.  The knee does appear a bit inflamed and patient thinks that the change in weather may have contributed.  Regardless, I reassured her that I do not see anything concerning at the moment I would like to place her on a short course of some anti-inflammatories to help with swelling and discomfort.  Will plan to have her follow-up if symptoms persist or worsen.  Plan :    -Start Meloxicam  15 mg and return to clinic as needed     I personally saw and evaluated the patient, and participated in the management and treatment plan.  Leonce Reveal, PA-C Orthopedics   This document was dictated using Conservation officer, historic buildings. A reasonable attempt at proof reading has been made to minimize errors.

## 2024-03-29 NOTE — Telephone Encounter (Signed)
 Called and spoke with the patient over the phone in regards to this. I explained her referral and the location to her also the WHY behind it. She gave a verbal understanding.

## 2024-03-29 NOTE — Telephone Encounter (Signed)
 Called and explained her referral to her. Gave a verbal understanding.

## 2024-03-29 NOTE — Telephone Encounter (Signed)
 Copied from CRM [id number]. Topic: Referral - Question >> Mar 28, 2024  3:26 PM Alfonso ORN wrote: Reason for CRM: pt called back to follow up on referral information as she was expecting a callback from the nurse. Advised end of business day for callback.

## 2024-04-02 ENCOUNTER — Other Ambulatory Visit (HOSPITAL_BASED_OUTPATIENT_CLINIC_OR_DEPARTMENT_OTHER): Payer: Self-pay

## 2024-04-03 ENCOUNTER — Ambulatory Visit (HOSPITAL_BASED_OUTPATIENT_CLINIC_OR_DEPARTMENT_OTHER): Admitting: Student

## 2024-04-08 ENCOUNTER — Encounter (HOSPITAL_BASED_OUTPATIENT_CLINIC_OR_DEPARTMENT_OTHER): Payer: Self-pay

## 2024-04-08 ENCOUNTER — Other Ambulatory Visit (HOSPITAL_BASED_OUTPATIENT_CLINIC_OR_DEPARTMENT_OTHER): Payer: Self-pay

## 2024-04-09 ENCOUNTER — Other Ambulatory Visit (HOSPITAL_BASED_OUTPATIENT_CLINIC_OR_DEPARTMENT_OTHER): Payer: Self-pay

## 2024-04-09 ENCOUNTER — Other Ambulatory Visit: Payer: Self-pay | Admitting: Internal Medicine

## 2024-04-10 ENCOUNTER — Other Ambulatory Visit (HOSPITAL_BASED_OUTPATIENT_CLINIC_OR_DEPARTMENT_OTHER): Payer: Self-pay

## 2024-04-10 MED ORDER — WEGOVY 0.25 MG/0.5ML ~~LOC~~ SOAJ
0.2500 mg | SUBCUTANEOUS | 1 refills | Status: DC
  Filled 2024-04-10: qty 2, 28d supply, fill #0

## 2024-04-11 ENCOUNTER — Other Ambulatory Visit: Payer: Self-pay | Admitting: General Surgery

## 2024-04-11 DIAGNOSIS — E041 Nontoxic single thyroid nodule: Secondary | ICD-10-CM

## 2024-04-15 ENCOUNTER — Other Ambulatory Visit (HOSPITAL_BASED_OUTPATIENT_CLINIC_OR_DEPARTMENT_OTHER): Payer: Self-pay

## 2024-04-16 ENCOUNTER — Other Ambulatory Visit (HOSPITAL_BASED_OUTPATIENT_CLINIC_OR_DEPARTMENT_OTHER): Payer: Self-pay

## 2024-04-18 ENCOUNTER — Other Ambulatory Visit (HOSPITAL_COMMUNITY): Payer: Self-pay

## 2024-04-19 ENCOUNTER — Other Ambulatory Visit (HOSPITAL_COMMUNITY)
Admission: RE | Admit: 2024-04-19 | Discharge: 2024-04-19 | Disposition: A | Source: Ambulatory Visit | Attending: Student | Admitting: Student

## 2024-04-19 ENCOUNTER — Ambulatory Visit
Admission: RE | Admit: 2024-04-19 | Discharge: 2024-04-19 | Disposition: A | Discharge status: Home / Self Care | Source: Ambulatory Visit | Attending: General Surgery | Admitting: General Surgery

## 2024-04-19 DIAGNOSIS — E041 Nontoxic single thyroid nodule: Secondary | ICD-10-CM

## 2024-04-22 ENCOUNTER — Encounter: Payer: Self-pay | Admitting: Family Medicine

## 2024-04-22 ENCOUNTER — Ambulatory Visit (INDEPENDENT_AMBULATORY_CARE_PROVIDER_SITE_OTHER): Admitting: Family Medicine

## 2024-04-22 ENCOUNTER — Other Ambulatory Visit (HOSPITAL_BASED_OUTPATIENT_CLINIC_OR_DEPARTMENT_OTHER): Payer: Self-pay

## 2024-04-22 VITALS — BP 118/64 | HR 100 | Temp 98.9°F | Resp 18 | Ht 63.0 in | Wt 193.0 lb

## 2024-04-22 DIAGNOSIS — J069 Acute upper respiratory infection, unspecified: Secondary | ICD-10-CM

## 2024-04-22 LAB — POCT INFLUENZA A/B
Influenza A, POC: NEGATIVE
Influenza B, POC: NEGATIVE

## 2024-04-22 LAB — CYTOLOGY - NON PAP

## 2024-04-22 LAB — POC COVID19 BINAXNOW: SARS Coronavirus 2 Ag: NEGATIVE

## 2024-04-22 LAB — POCT RAPID STREP A (OFFICE): Rapid Strep A Screen: NEGATIVE

## 2024-04-22 MED ORDER — PROMETHAZINE-DM 6.25-15 MG/5ML PO SYRP
5.0000 mL | ORAL_SOLUTION | Freq: Four times a day (QID) | ORAL | 0 refills | Status: DC | PRN
  Filled 2024-04-22: qty 118, 6d supply, fill #0

## 2024-04-22 NOTE — Progress Notes (Signed)
 Assessment & Plan Viral URI Likely viral etiology with expected symptom resolution in 7-10 days. - Recommend Tylenol  Cold and Flu, daytime and nighttime formulations. - Advise throat lozenges, chloroceptic spray, warm salt water gargles. Orders:   POCT Influenza A/B   POC COVID-19 BinaxNow   POCT rapid strep A   promethazine -dextromethorphan (PROMETHAZINE -DM) 6.25-15 MG/5ML syrup; Take 5 mLs by mouth 4 (four) times daily as needed for cough.   Results for orders placed or performed in visit on 04/22/24  POCT Influenza A/B  Result Value Ref Range   Influenza A, POC Negative Negative   Influenza B, POC Negative Negative  POC COVID-19 BinaxNow  Result Value Ref Range   SARS Coronavirus 2 Ag Negative Negative  POCT rapid strep A  Result Value Ref Range   Rapid Strep A Screen Negative Negative     Follow up plan: Return if symptoms worsen or fail to improve.  Niki Rung, MSN, APRN, FNP-C  Subjective:  HPI: [name] is a 48 y.o. female presenting on 04/22/2024 for Cough (Low grade fever, ST, cough (chest feels tight), congestion- clear, runny nose - clear, no GI, some body aches/Started yesterday )  Discussed the use of AI scribe software for clinical note transcription with the patient, who gave verbal consent to proceed.  She experienced a sudden onset of symptoms yesterday, describing it as hitting her 'like a ton of bricks'. She had a low-grade fever of 100.36F last night, which persisted into the middle of the night. She took Tylenol , which did not alleviate her cough.  Her cough is severe, causing significant discomfort to the point where her husband 'about kicked me out of the room'. She describes her throat as 'super, super, super raw', with a burning sensation every time she coughs. She produces a small amount of sputum with her cough but is unsure of its color. No wheezing or shortness of breath.  She mentions a history of social smoking, specifically  cigars, but not on a daily basis.  She recently underwent a thyroid  nodule biopsy on Friday, which is pending results. She notes that her symptoms began gradually after the biopsy, with a significant worsening yesterday.       ROS: Negative unless specifically indicated above in HPI.   Relevant past medical history reviewed and updated as indicated.   Allergies and medications reviewed and updated.   Current Outpatient Medications:    empagliflozin  (JARDIANCE ) 10 MG TABS tablet, Take 1 tablet (10 mg total) by mouth daily., Disp: 30 tablet, Rfl: 5   estradiol  (VIVELLE -DOT) 0.1 MG/24HR patch, Place 1 patch (0.1 mg total) onto the skin 2 (two) times a week., Disp: 24 patch, Rfl: 0   metoprolol  succinate (TOPROL  XL) 25 MG 24 hr tablet, Take 1 tablet (25 mg total) by mouth daily., Disp: 90 tablet, Rfl: 1   mupirocin  ointment (BACTROBAN ) 2 %, Apply 1 Application topically 2 (two) times daily., Disp: 22 g, Rfl: 3   potassium chloride  (KLOR-CON  10) 10 MEQ tablet, Take 1 tablet (10 mEq total) by mouth 3 (three) times daily., Disp: 270 tablet, Rfl: 0   promethazine -dextromethorphan (PROMETHAZINE -DM) 6.25-15 MG/5ML syrup, Take 5 mLs by mouth 4 (four) times daily as needed for cough., Disp: 118 mL, Rfl: 0   sacubitril -valsartan  (ENTRESTO ) 24-26 MG, Take 1 tablet by mouth 2 (two) times daily., Disp: 60 tablet, Rfl: 1   semaglutide -weight management (WEGOVY ) 0.25 MG/0.5ML SOAJ SQ injection, Inject 0.25 mg into the skin once a week., Disp: 2 mL, Rfl:  1   torsemide  (DEMADEX ) 10 MG tablet, Take 3 tablets (30 mg total) by mouth daily., Disp: 270 tablet, Rfl: 3   Ubrogepant  (UBRELVY ) 100 MG TABS, Take 1 tablet (100 mg total) by mouth daily as needed., Disp: 30 tablet, Rfl: 1   gabapentin  (NEURONTIN ) 100 MG capsule, Take 1 capsule (100 mg total) by mouth at bedtime. (Patient not taking: Reported on 04/22/2024), Disp: 30 capsule, Rfl: 3   oxyCODONE  (OXY IR/ROXICODONE ) 5 MG immediate release tablet, Take 1 tablet  (5 mg total) by mouth every 6 (six) hours as needed for up to 8 doses., Disp: 8 tablet, Rfl: 0   valACYclovir (VALTREX) 500 MG tablet, Take 500 mg by mouth as needed. (Patient not taking: Reported on 04/22/2024), Disp: , Rfl:   Allergies  Allergen Reactions   Bee Venom Swelling and Rash    Objective:   BP 118/64   Pulse 100   Temp 98.9 F (37.2 C)   Resp 18   Ht 5' 3 (1.6 m)   Wt 193 lb (87.5 kg)   LMP 01/12/2012   SpO2 97%   BMI 34.19 kg/m    Physical Exam Vitals reviewed.  Constitutional:      General: She is not in acute distress.    Appearance: Normal appearance. She is not ill-appearing, toxic-appearing or diaphoretic.  HENT:     Head: Normocephalic and atraumatic.     Right Ear: Ear canal and external ear normal. There is no impacted cerumen. Tympanic membrane is erythematous. Tympanic membrane is not perforated, retracted or bulging.     Left Ear: Tympanic membrane, ear canal and external ear normal. There is no impacted cerumen.     Nose: Congestion present. No rhinorrhea.     Right Sinus: No maxillary sinus tenderness or frontal sinus tenderness.     Left Sinus: No maxillary sinus tenderness or frontal sinus tenderness.     Mouth/Throat:     Mouth: Mucous membranes are moist.     Pharynx: Oropharynx is clear. Posterior oropharyngeal erythema present. No oropharyngeal exudate.  Eyes:     General: No scleral icterus.       Right eye: No discharge.        Left eye: No discharge.     Conjunctiva/sclera: Conjunctivae normal.  Cardiovascular:     Rate and Rhythm: Normal rate and regular rhythm.     Heart sounds: Normal heart sounds. No murmur heard.    No friction rub. No gallop.  Pulmonary:     Effort: Pulmonary effort is normal. No respiratory distress.     Breath sounds: Normal breath sounds. No stridor. No wheezing, rhonchi or rales.  Musculoskeletal:        General: Normal range of motion.     Cervical back: Normal range of motion.  Lymphadenopathy:      Cervical: No cervical adenopathy.  Skin:    General: Skin is warm and dry.     Capillary Refill: Capillary refill takes less than 2 seconds.  Neurological:     General: No focal deficit present.     Mental Status: She is alert and oriented to person, place, and time. Mental status is at baseline.  Psychiatric:        Mood and Affect: Mood normal.        Behavior: Behavior normal.        Thought Content: Thought content normal.        Judgment: Judgment normal.

## 2024-04-23 ENCOUNTER — Other Ambulatory Visit (HOSPITAL_COMMUNITY): Payer: Self-pay

## 2024-04-23 ENCOUNTER — Ambulatory Visit: Admitting: Dermatology

## 2024-04-24 ENCOUNTER — Telehealth: Admitting: Family Medicine

## 2024-04-24 ENCOUNTER — Encounter: Payer: Self-pay | Admitting: Family Medicine

## 2024-04-24 ENCOUNTER — Other Ambulatory Visit (HOSPITAL_BASED_OUTPATIENT_CLINIC_OR_DEPARTMENT_OTHER): Payer: Self-pay

## 2024-04-24 VITALS — Temp 100.5°F

## 2024-04-24 DIAGNOSIS — R051 Acute cough: Secondary | ICD-10-CM

## 2024-04-24 DIAGNOSIS — J069 Acute upper respiratory infection, unspecified: Secondary | ICD-10-CM

## 2024-04-24 MED ORDER — HYDROCODONE BIT-HOMATROP MBR 5-1.5 MG/5ML PO SOLN
5.0000 mL | Freq: Four times a day (QID) | ORAL | 0 refills | Status: DC | PRN
  Filled 2024-04-24 (×2): qty 120, 6d supply, fill #0

## 2024-04-24 NOTE — Progress Notes (Signed)
 Patient ID: [name], female   DOB: 1975-09-04, 48 y.o.   MRN: [id number]   Virtual Visit via Video Note  I connected with [name] on 04/24/24 at  1:00 PM EDT by a video enabled telemedicine application and verified that I am speaking with the correct person using two identifiers.  Location patient: home Location provider:work or home office Persons participating in the virtual visit: patient, provider  I discussed the limitations of evaluation and management by telemedicine and the availability of in person appointments. The patient expressed understanding and agreed to proceed.   HPI: [name] was seen by virtual visit today for some persistent cough and low-grade fever.  She had onset of congestion, sore throat, cough this past Sunday.  She went to clinic on Monday and had testing for strep, COVID, influenza all negative.  She was given Promethazine  DM cough syrup which has not helped with her cough.  Cough has been fairly severe at night.  She has maintained fever on 100.4.  Increased fatigue.  Diffuse bodyaches.  She does have history of mitral valve prolapse and prior mitral valve repair and also history of diastolic heart failure.  Denies any recent chest pains.  She has had some loss of smell.  No nausea, vomiting, or diarrhea.   ROS: See pertinent positives and negatives per HPI.  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   32 mm Sorin Memo 4D ring annuloplasty via right mini thoracotomy approach    Squamous cell carcinoma of skin    Ulcer     Past Surgical History:  Procedure Laterality Date   ABDOMINAL HYSTERECTOMY     CARDIAC VALVE REPLACEMENT     Repair   CHOLECYSTECTOMY N/A 01/16/2020   Procedure: LAPAROSCOPIC CHOLECYSTECTOMY WITH INTRAOPERATIVE CHOLANGIOGRAM;  Surgeon: Eletha Boas, MD;  Location: [facility];  Service: General;  Laterality: N/A;   COLONOSCOPY     KNEE ARTHROSCOPY WITH MACI  CARTILAGE HARVEST Left 10/04/2022   Procedure: LEFT KNEE ARTHROSCOPY WITH MACI  CARTILAGE HARVEST;  Surgeon: Genelle Standing, MD;  Location: [facility];  Service: Orthopedics;  Laterality: Left;   KNEE ARTHROSCOPY WITH MEDIAL MENISECTOMY Left 06/07/2013   Procedure: LEFT KNEE ARTHROSCOPY WITH SYNOVECTOMY LIMITED, ARTHROSCOPY KNEE WITH DEBRIDEMENT/SHAVING (CHONDROPLASTY), ARTHROSCOPY KNEE  ChondroplastyWITH MEDIAL AND LATERAL  MENISECTOMY;  Surgeon: Evalene JONETTA Chancy, MD;  Location: [facility];  Service: Orthopedics;  Laterality: Left;   KNEE ARTHROSCOPY WITH MEDIAL PATELLAR FEMORAL LIGAMENT RECONSTRUCTION Left 12/20/2022   Procedure: KNEE ARTHROSCOPY WITH MEDIAL PATELLAR FEMORAL LIGAMENT RECONSTRUCTION;  Surgeon: Genelle Standing, MD;  Location: [facility];  Service: Orthopedics;  Laterality: Left;   left knee arthroscopy     MITRAL VALVE REPAIR Right 11/28/2018   Procedure: MINIMALLY INVASIVE MITRAL VALVE REPAIR (MVR) - Using Memo 4D Ring Size 32;  Surgeon: Dusty Sudie DEL, MD;  Location: [facility] OR;  Service: Open Heart Surgery;  Laterality:  Right;   patrial hysterectomy     POLYPECTOMY     SALPINGOOPHORECTOMY  2022   SYNOVECTOMY Left 06/07/2013   Procedure: SYNOVECTOMY;  Surgeon: Evalene JONETTA Chancy, MD;  Location: [facility];  Service: Orthopedics;  Laterality: Left;   TEE WITHOUT CARDIOVERSION  10/11/2011   Procedure: TRANSESOPHAGEAL ECHOCARDIOGRAM (TEE);  Surgeon: Maude JAYSON Emmer, MD;  Location: [facility];  Service: Cardiovascular;   Laterality: N/A;   TEE WITHOUT CARDIOVERSION N/A 11/28/2018   Procedure: TRANSESOPHAGEAL ECHOCARDIOGRAM (TEE);  Surgeon: Dusty Sudie DEL, MD;  Location: [facility] OR;  Service: Open Heart Surgery;  Laterality: N/A;   TUBAL LIGATION     WISDOM TOOTH EXTRACTION      Family History  Problem Relation Age of Onset   Other Mother        lynch syndrome   Colon cancer Father 37   Colon polyps Maternal Uncle    Prostate cancer Maternal Uncle 64   Colon cancer Paternal Aunt 30   Colon cancer Paternal Uncle 25   Colon cancer Paternal Uncle 6   Multiple myeloma Maternal Grandmother 75   Parkinsonism Paternal Grandfather    Other Daughter        Lynch syndrome   Breast cancer Cousin        lynch syndrome    Breast cancer Other        Paternal Great Grandmother   Esophageal cancer Neg Hx    Rectal cancer Neg Hx    Stomach cancer Neg Hx     SOCIAL HX: Non-smoker   Current Outpatient Medications:    empagliflozin  (JARDIANCE ) 10 MG TABS tablet, Take 1 tablet (10 mg total) by mouth daily., Disp: 30 tablet, Rfl: 5   estradiol  (VIVELLE -DOT) 0.1 MG/24HR patch, Place 1 patch (0.1 mg total) onto the skin 2 (two) times a week., Disp: 24 patch, Rfl: 0   HYDROcodone  bit-homatropine (HYCODAN) 5-1.5 MG/5ML syrup, Take 5 mLs by mouth every 6 (six) hours as needed., Disp: 120 mL, Rfl: 0   metoprolol  succinate (TOPROL  XL) 25 MG 24 hr tablet, Take 1 tablet (25 mg total) by mouth daily., Disp: 90 tablet, Rfl: 1   mupirocin  ointment (BACTROBAN ) 2 %, Apply 1 Application topically 2 (two) times daily., Disp: 22 g, Rfl: 3   potassium chloride  (KLOR-CON  10) 10 MEQ tablet, Take 1 tablet (10 mEq total) by mouth 3 (three) times daily., Disp: 270 tablet, Rfl: 0   sacubitril -valsartan  (ENTRESTO ) 24-26 MG, Take 1 tablet by mouth 2 (two) times daily., Disp: 60 tablet, Rfl: 1   torsemide  (DEMADEX ) 10 MG tablet, Take 3 tablets (30 mg total) by mouth daily., Disp: 270 tablet, Rfl: 3   Ubrogepant  (UBRELVY ) 100 MG TABS, Take 1  tablet (100 mg total) by mouth daily as needed., Disp: 30 tablet, Rfl: 1   gabapentin  (NEURONTIN ) 100 MG capsule, Take 1 capsule (100 mg total) by mouth at bedtime. (Patient not taking: Reported on 04/24/2024), Disp: 30 capsule, Rfl: 3   semaglutide -weight management (WEGOVY ) 0.25 MG/0.5ML SOAJ SQ injection, Inject 0.25 mg into the skin once a week. (Patient not taking: Reported on 04/24/2024), Disp: 2 mL, Rfl: 1   valACYclovir (VALTREX) 500 MG tablet, Take 500 mg by mouth as needed. (Patient not taking: Reported on 04/24/2024), Disp: , Rfl:   EXAM:  VITALS per patient if applicable:  GENERAL: alert, oriented, appears well and in no acute distress  HEENT: atraumatic, conjunttiva clear, no obvious abnormalities on inspection of external nose and ears  NECK: normal movements  of the head and neck  LUNGS: on inspection no signs of respiratory distress, breathing rate appears normal, no obvious gross SOB, gasping or wheezing  CV: no obvious cyanosis  MS: moves all visible extremities without noticeable abnormality  PSYCH/NEURO: pleasant and cooperative, no obvious depression or anxiety, speech and thought processing grossly intact  ASSESSMENT AND PLAN:  Discussed the following assessment and plan:  Acute cough - Plan: DG Chest 2 View patient is seen with some persistent URI type symptoms with cough.  Recent COVID and influenza testing negative.  Probably still viral.  She is having some persistent low-grade fever.  We placed order for chest x-ray which she will go for later today.  Plenty fluids and rest.  We did write for Hycodan cough syrup 1 teaspoon nightly as needed for severe cough since she was not getting relief with Promethazine  DM.  Touch base for any increasing fever, worsening shortness of breath, or other concerns.     I discussed the assessment and treatment plan with the patient. The patient was provided an opportunity to ask questions and all were answered. The patient  agreed with the plan and demonstrated an understanding of the instructions.   The patient was advised to call back or seek an in-person evaluation if the symptoms worsen or if the condition fails to improve as anticipated.     Wolm Scarlet, MD

## 2024-04-26 ENCOUNTER — Ambulatory Visit (HOSPITAL_BASED_OUTPATIENT_CLINIC_OR_DEPARTMENT_OTHER)
Admission: RE | Admit: 2024-04-26 | Discharge: 2024-04-26 | Disposition: A | Discharge status: Home / Self Care | Source: Ambulatory Visit | Attending: Family Medicine | Admitting: Family Medicine

## 2024-04-26 DIAGNOSIS — R051 Acute cough: Secondary | ICD-10-CM | POA: Diagnosis present

## 2024-04-27 ENCOUNTER — Encounter: Payer: Self-pay | Admitting: Family Medicine

## 2024-04-29 ENCOUNTER — Ambulatory Visit: Payer: Self-pay | Admitting: Family Medicine

## 2024-04-29 NOTE — Telephone Encounter (Signed)
 Please see result note

## 2024-05-06 ENCOUNTER — Encounter: Payer: Self-pay | Admitting: Radiology

## 2024-05-20 ENCOUNTER — Other Ambulatory Visit: Payer: Self-pay | Admitting: Cardiology

## 2024-05-20 DIAGNOSIS — I5032 Chronic diastolic (congestive) heart failure: Secondary | ICD-10-CM

## 2024-05-20 DIAGNOSIS — R072 Precordial pain: Secondary | ICD-10-CM | POA: Insufficient documentation

## 2024-05-20 DIAGNOSIS — I10 Essential (primary) hypertension: Secondary | ICD-10-CM

## 2024-05-20 NOTE — Progress Notes (Unsigned)
 Cardiology Office Note:   Date:  05/21/2024  ID:  [name], DOB [birth year], MRN [id number] PCP: Joshua Debby CROME, MD  [facility] Providers Cardiologist:  Lynwood Schilling, MD {  History of Present Illness:   [name] is a 48 y.o. female  who presents for follow up of mitral valve prolapse and mitral regurgitation noted in 2013 with bileaflet prolapse.  The patient had a minimally invasive mitral valve repair using a Memo 4D ring size 32 (right chest) by Dr. Sudie Balloon on 11/28/2018.   Her last echo was in 2023.  She had a stable HV repair.     Since I last saw her she had an echo in December 2023 with normal MV function.   The patient denies any new symptoms such as chest discomfort, neck or arm discomfort. There has been no new shortness of breath, PND or orthopnea. There have been no reported palpitations, presyncope or syncope.   She has been treated for cough and has not been getting any better.   She continues to have cough without production.  She is not having fevers.  She has had no chest pain.  No PND.  She did have some non anginal chest pain in August and she had enzymes at her primary care office and her troponin was minimally elevated.  However, when she went to the ER it was not elevated. EKG was not acute.  She had a chest CT which did not demonstrate pulmonary embolism but there was a nodule.  She had a follow up PET study.  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  She walks at work but she does not exercise routinely.   ROS: As stated in the HPI and negative for all other systems.  Studies Reviewed:    EKG:   EKG Interpretation Date/Time:  Tuesday May 21 2024 15:41:09 EST Ventricular Rate:  102 PR Interval:  134 QRS Duration:  78 QT Interval:  366 QTC Calculation: 477 R Axis:   55  Text Interpretation: Sinus tachycardia Possible Left atrial enlargement Cannot rule out  Anterior infarct , age undetermined Borderline QT prolongation When compared with ECG of 15-Feb-2024 13:46, ST no longer depressed in Inferior leads T wave inversion no longer evident in Inferior leads Nonspecific T wave abnormality no longer evident in Lateral leads Confirmed by Schilling Lynwood ([id number]) on 05/21/2024 3:50:47 PM    Risk Assessment/Calculations:              Physical Exam:   VS:  BP 108/70 (BP Location: Right Arm, Patient Position: Sitting, Cuff Size: Normal)   Pulse (!) 102   Ht 5' 3 (1.6 m)   Wt 192 lb (87.1 kg)   LMP 01/12/2012   SpO2 95%   BMI 34.01 kg/m    Wt Readings from Last 3 Encounters:  05/21/24 192 lb (87.1 kg)  04/22/24 193 lb (87.5 kg)  02/23/24 189 lb (85.7 kg)     GEN: Well nourished, well developed in no acute distress NECK: No JVD; No carotid bruits CARDIAC: RRR, no murmurs, rubs, gallops RESPIRATORY: Diffuse coarse crackles ABDOMEN: Soft, non-tender, non-distended EXTREMITIES:  No edema; No deformity   ASSESSMENT AND PLAN:   Mitral valve disease:    She had mild MR and mild MS on echo in 2023.    I [name] check an echocardiogram.  Hypertension:   She has been followed in the Pharm  D HTN clinic.   Her blood pressure is at target.  No change in therapy.   Cardiomyopathy:   Her ejection fraction was well-preserved in December 2023.  I [name] check this as above.  Chest pain: Her chest discomfort is not anginal.  She does have continued cough with decreased breath sounds.  I am going to go ahead and check a chest x-ray.  I have given her another round of cough medicine and she should follow-up with her primary care doctors.   Prolonged QT:   QT is mildly prolonged and she has had no symptoms related to this.  No change in therapy.  She should avoid further QT prolonging drugs.       Follow up with me in 1 year  Signed, Lynwood Schilling, MD

## 2024-05-21 ENCOUNTER — Ambulatory Visit (HOSPITAL_COMMUNITY)
Admission: RE | Admit: 2024-05-21 | Discharge: 2024-05-21 | Disposition: A | Discharge status: Home / Self Care | Source: Ambulatory Visit | Attending: Cardiology | Admitting: Cardiology

## 2024-05-21 ENCOUNTER — Other Ambulatory Visit (HOSPITAL_COMMUNITY): Payer: Self-pay

## 2024-05-21 ENCOUNTER — Other Ambulatory Visit (HOSPITAL_BASED_OUTPATIENT_CLINIC_OR_DEPARTMENT_OTHER): Payer: Self-pay

## 2024-05-21 ENCOUNTER — Ambulatory Visit: Attending: Cardiology | Admitting: Cardiology

## 2024-05-21 ENCOUNTER — Encounter: Payer: Self-pay | Admitting: Cardiology

## 2024-05-21 VITALS — BP 108/70 | HR 102 | Ht 63.0 in | Wt 192.0 lb

## 2024-05-21 DIAGNOSIS — I10 Essential (primary) hypertension: Secondary | ICD-10-CM

## 2024-05-21 DIAGNOSIS — R072 Precordial pain: Secondary | ICD-10-CM | POA: Insufficient documentation

## 2024-05-21 DIAGNOSIS — R9431 Abnormal electrocardiogram [ECG] [EKG]: Secondary | ICD-10-CM | POA: Diagnosis not present

## 2024-05-21 DIAGNOSIS — I340 Nonrheumatic mitral (valve) insufficiency: Secondary | ICD-10-CM | POA: Insufficient documentation

## 2024-05-21 MED ORDER — GUAIFENESIN-CODEINE 100-10 MG/5ML PO SOLN: 5.0000 mL | ORAL | 0 refills | Status: DC | PRN

## 2024-05-21 MED ORDER — GUAIFENESIN-CODEINE 100-10 MG/5ML PO SOLN
5.0000 mL | ORAL | 0 refills | Status: DC | PRN
  Filled 2024-05-21: qty 120, 4d supply, fill #0

## 2024-05-21 MED ORDER — SACUBITRIL-VALSARTAN 24-26 MG PO TABS
1.0000 | ORAL_TABLET | Freq: Two times a day (BID) | ORAL | 1 refills | Status: AC
  Filled 2024-05-21: qty 60, 30d supply, fill #0
  Filled 2024-08-05: qty 60, 30d supply, fill #1

## 2024-05-21 NOTE — Patient Instructions (Addendum)
 Medication Instructions:  Continue same medications *If you need a refill on your cardiac medications before your next appointment, please call your pharmacy*  Lab Work: None ordered  Testing/Procedures: Chest xray  schedule soon  Echo   First available   Follow-Up: At [facility], you and your health needs are our priority.  As part of our continuing mission to provide you with exceptional heart care, our providers are all part of one team.  This team includes your primary Cardiologist (physician) and Advanced Practice Providers or APPs (Physician Assistants and Nurse Practitioners) who all work together to provide you with the care you need, when you need it.  Your next appointment:  1 year  Call in August to schedule Nov appointment     Provider:  Dr.Hochrein     We recommend signing up for the patient portal called MyChart.  Sign up information is provided on this After Visit Summary.  MyChart is used to connect with patients for Virtual Visits (Telemedicine).  Patients are able to view lab/test results, encounter notes, upcoming appointments, etc.  Non-urgent messages can be sent to your provider as well.   To learn more about what you can do with MyChart, go to [email or web address].

## 2024-05-22 ENCOUNTER — Ambulatory Visit: Admitting: Emergency Medicine

## 2024-05-23 ENCOUNTER — Encounter: Payer: Self-pay | Admitting: Cardiology

## 2024-05-26 ENCOUNTER — Ambulatory Visit: Payer: Self-pay | Admitting: Cardiology

## 2024-05-26 DIAGNOSIS — Z9889 Other specified postprocedural states: Secondary | ICD-10-CM

## 2024-05-26 DIAGNOSIS — I050 Rheumatic mitral stenosis: Secondary | ICD-10-CM

## 2024-05-27 ENCOUNTER — Ambulatory Visit: Admitting: Emergency Medicine

## 2024-05-27 NOTE — Telephone Encounter (Signed)
 Pt notified of results via MyChart. Last read by Amadeo JONETTA Slade at 12:18PM on 05/24/2024.

## 2024-05-28 ENCOUNTER — Ambulatory Visit: Admitting: Physician Assistant

## 2024-06-05 ENCOUNTER — Other Ambulatory Visit: Payer: Self-pay

## 2024-06-05 ENCOUNTER — Other Ambulatory Visit (HOSPITAL_BASED_OUTPATIENT_CLINIC_OR_DEPARTMENT_OTHER): Payer: Self-pay

## 2024-06-05 ENCOUNTER — Ambulatory Visit

## 2024-06-05 VITALS — Ht 63.0 in | Wt 185.0 lb

## 2024-06-05 DIAGNOSIS — Z1509 Genetic susceptibility to other malignant neoplasm: Secondary | ICD-10-CM

## 2024-06-05 DIAGNOSIS — Z860100 Personal history of colon polyps, unspecified: Secondary | ICD-10-CM

## 2024-06-05 MED ORDER — ONDANSETRON HCL 4 MG PO TABS
4.0000 mg | ORAL_TABLET | ORAL | 0 refills | Status: AC
  Filled 2024-06-05 – 2024-07-23 (×5): qty 4, 2d supply, fill #0

## 2024-06-05 MED ORDER — PLENVU 140 G PO SOLR
1.0000 | ORAL | 0 refills | Status: DC
  Filled 2024-06-05: qty 3, 1d supply, fill #0
  Filled 2024-06-17: qty 3, 30d supply, fill #0

## 2024-06-05 NOTE — Progress Notes (Signed)

## 2024-06-11 ENCOUNTER — Encounter: Payer: Self-pay | Admitting: Internal Medicine

## 2024-06-12 ENCOUNTER — Other Ambulatory Visit (HOSPITAL_BASED_OUTPATIENT_CLINIC_OR_DEPARTMENT_OTHER): Payer: Self-pay

## 2024-06-12 ENCOUNTER — Other Ambulatory Visit: Payer: Self-pay | Admitting: Cardiology

## 2024-06-12 MED ORDER — TORSEMIDE 10 MG PO TABS
30.0000 mg | ORAL_TABLET | Freq: Every day | ORAL | 3 refills | Status: AC
  Filled 2024-08-05: qty 90, 30d supply, fill #0

## 2024-06-15 ENCOUNTER — Other Ambulatory Visit (HOSPITAL_BASED_OUTPATIENT_CLINIC_OR_DEPARTMENT_OTHER): Payer: Self-pay

## 2024-06-17 ENCOUNTER — Other Ambulatory Visit (HOSPITAL_BASED_OUTPATIENT_CLINIC_OR_DEPARTMENT_OTHER): Payer: Self-pay

## 2024-06-18 ENCOUNTER — Ambulatory Visit: Admitting: Internal Medicine

## 2024-06-18 ENCOUNTER — Encounter: Payer: Self-pay | Admitting: Internal Medicine

## 2024-06-18 VITALS — BP 103/68 | HR 62 | Temp 97.9°F | Resp 17 | Ht 63.0 in | Wt 185.0 lb

## 2024-06-18 DIAGNOSIS — Z860100 Personal history of colon polyps, unspecified: Secondary | ICD-10-CM

## 2024-06-18 DIAGNOSIS — K635 Polyp of colon: Secondary | ICD-10-CM | POA: Diagnosis not present

## 2024-06-18 DIAGNOSIS — D124 Benign neoplasm of descending colon: Secondary | ICD-10-CM

## 2024-06-18 DIAGNOSIS — Z1507 Genetic susceptibility to malignant neoplasm of urinary tract: Secondary | ICD-10-CM

## 2024-06-18 MED ORDER — SODIUM CHLORIDE 0.9 % IV SOLN: 500.0000 mL | INTRAVENOUS | Status: DC

## 2024-06-18 NOTE — Patient Instructions (Signed)
YOU HAD AN ENDOSCOPIC PROCEDURE TODAY AT THE [facility]:   Refer to the procedure report that was given to you for any specific questions about what was found during the examination.  If the procedure report does not answer your questions, please call your gastroenterologist to clarify.  If you requested that your care partner not be given the details of your procedure findings, then the procedure report has been included in a sealed envelope for you to review at your convenience later.  **Handouts given on polyps, diverticulosis and hemorrhoids**  YOU SHOULD EXPECT: Some feelings of bloating in the abdomen. Passage of more gas than usual.  Walking can help get rid of the air that was put into your GI tract during the procedure and reduce the bloating. If you had a lower endoscopy (such as a colonoscopy or flexible sigmoidoscopy) you may notice spotting of blood in your stool or on the toilet paper. If you underwent a bowel prep for your procedure, you may not have a normal bowel movement for a few days.  Please Note:  You might notice some irritation and congestion in your nose or some drainage.  This is from the oxygen used during your procedure.  There is no need for concern and it should clear up in a day or so.  SYMPTOMS TO REPORT IMMEDIATELY:  Following lower endoscopy (colonoscopy or flexible sigmoidoscopy):  Excessive amounts of blood in the stool  Significant tenderness or worsening of abdominal pains  Swelling of the abdomen that is new, acute  Fever of 100F or higher  For urgent or emergent issues, a gastroenterologist can be reached at any hour by calling [phone]. Do not use MyChart messaging for urgent concerns.    DIET:  We do recommend a small meal at first, but then you may proceed to your regular diet.  Drink plenty of fluids but you should avoid alcoholic beverages for 24 hours.  ACTIVITY:  You should plan to take it easy for the rest of today and you  should NOT DRIVE or use heavy machinery until tomorrow (because of the sedation medicines used during the test).    FOLLOW UP: Our staff will call the number listed on your records the next business day following your procedure.  We will call around 7:15- 8:00 am to check on you and address any questions or concerns that you may have regarding the information given to you following your procedure. If we do not reach you, we will leave a message.     If any biopsies were taken you will be contacted by phone or by letter within the next 1-3 weeks.  Please call us at [phone] if you have not heard about the biopsies in 3 weeks.    SIGNATURES/CONFIDENTIALITY: You and/or your care partner have signed paperwork which will be entered into your electronic medical record.  These signatures attest to the fact that that the information above on your After Visit Summary has been reviewed and is understood.  Full responsibility of the confidentiality of this discharge information lies with you and/or your care-partner. 

## 2024-06-18 NOTE — Op Note (Signed)
 [facility] Patient Name: [name] Procedure Date: 06/18/2024 10:15 AM MRN: [id number] Endoscopist: Norleen SAILOR. Abran , MD, [id number] Age: 48 Referring MD:  Date of Birth: [birth year] Gender: Female Account #: [id number] Procedure:                Colonoscopy with cold snare polypectomy x 1 Indications:              High risk colon cancer surveillance: Personal                            history of multiple (3 or more) adenomas, High risk                            colon cancer surveillance: Personal history of                            sessile serrated colon polyp (less than 10 mm in                            size) with no dysplasia. Personal history of LYNCH                            syndrome. Has been undergoing annual colonoscopy                            with Dr. Aneita. Now for surveillance Medicines:                Monitored Anesthesia Care Procedure:                Pre-Anesthesia Assessment:                           - Prior to the procedure, a History and Physical                            was performed, and patient medications and                            allergies were reviewed. The patient's tolerance of                            previous anesthesia was also reviewed. The risks                            and benefits of the procedure and the sedation                            options and risks were discussed with the patient.                            All questions were answered, and informed consent                            was obtained. Prior Anticoagulants: The patient has  taken no anticoagulant or antiplatelet agents. ASA                            Grade Assessment: II - A patient with mild systemic                            disease. After reviewing the risks and benefits,                            the patient was deemed in satisfactory condition to                            undergo the procedure.                            After obtaining informed consent, the colonoscope                            was passed under direct vision. Throughout the                            procedure, the patient's blood pressure, pulse, and                            oxygen saturations were monitored continuously. The                            CF [id number] #[id number] was introduced through the anus                            and advanced to the the cecum, identified by                            appendiceal orifice and ileocecal valve. The                            ileocecal valve, appendiceal orifice, and rectum                            were photographed. The quality of the bowel                            preparation was excellent. The colonoscopy was                            performed without difficulty. The patient tolerated                            the procedure well. The bowel preparation used was                            SUPREP via split dose instruction. Scope In: 10:40:00 AM Scope Out: 10:53:27 AM Scope Withdrawal Time: 0 hours 10 minutes 7 seconds  Total Procedure Duration:  0 hours 13 minutes 27 seconds  Findings:                 A 4 mm polyp was found in the descending colon. The                            polyp was removed with a cold snare. Resection and                            retrieval were complete.                           Multiple diverticula were found in the entire colon.                           Internal hemorrhoids were found during retroflexion.                           The exam was otherwise without abnormality on                            direct and retroflexion views. Complications:            No immediate complications. Estimated blood loss:                            None. Estimated Blood Loss:     Estimated blood loss: none. Impression:               - One 4 mm polyp in the descending colon, removed                            with a cold snare. Resected and retrieved.                            - Diverticulosis in the entire examined colon.                           - Internal hemorrhoids.                           - The examination was otherwise normal on direct                            and retroflexion views. Recommendation:           - Repeat colonoscopy in 1 year for surveillance                            [facility]).                           - Patient has a contact number available for                            emergencies. The signs and symptoms of potential  delayed complications were discussed with the                            patient. Return to normal activities tomorrow.                            Written discharge instructions were provided to the                            patient.                           - Resume previous diet.                           - Continue present medications.                           - Await pathology results. Norleen SAILOR. Abran, MD 06/18/2024 10:58:04 AM This report has been signed electronically.

## 2024-06-18 NOTE — Progress Notes (Signed)
 Called to room to assist during endoscopic procedure.  Patient ID and intended procedure confirmed with present staff. Received instructions for my participation in the procedure from the performing physician.

## 2024-06-18 NOTE — Progress Notes (Signed)
 Sedate, gd SR, tolerated procedure well, VSS, report to RN

## 2024-06-18 NOTE — Progress Notes (Signed)
 HISTORY OF PRESENT ILLNESS:  [name] is a 49 y.o. female with personal history of Lynch syndrome as well as adenomatous and sessile serrated polyps.  Has been undergoing annual colonoscopy with Dr. Aneita.  Now for surveillance  REVIEW OF SYSTEMS:  All non-GI ROS negative except for  Past Medical History:  Diagnosis Date   Adenomatous colon polyp 12/2012   Allergic rhinitis    Allergy    Arthritis    Cellulitis    Common migraine    Diastolic dysfunction    Dysrhythmia    External hemorrhoid    Genital herpes    GERD (gastroesophageal reflux disease)    Heart murmur    Hx of migraines    Hyperlipidemia    Hypertension    Lynch syndrome    Mitral regurgitation    MVP (mitral valve prolapse)    Plica syndrome of left knee    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   32 mm Sorin Memo 4D ring annuloplasty via right mini thoracotomy approach   Squamous cell carcinoma of skin    Ulcer     Past Surgical History:  Procedure Laterality Date   ABDOMINAL HYSTERECTOMY     CARDIAC VALVE REPLACEMENT     Repair   CHOLECYSTECTOMY N/A 01/16/2020   Procedure: LAPAROSCOPIC CHOLECYSTECTOMY WITH INTRAOPERATIVE CHOLANGIOGRAM;  Surgeon: Eletha Boas, MD;  Location: [facility];  Service: General;  Laterality: N/A;   COLONOSCOPY     KNEE ARTHROSCOPY WITH MACI  CARTILAGE HARVEST Left 10/04/2022   Procedure: LEFT KNEE ARTHROSCOPY WITH MACI  CARTILAGE HARVEST;  Surgeon: Genelle Standing, MD;  Location: [facility];  Service: Orthopedics;  Laterality: Left;   KNEE ARTHROSCOPY WITH MEDIAL MENISECTOMY Left 06/07/2013   Procedure: LEFT KNEE ARTHROSCOPY WITH SYNOVECTOMY LIMITED, ARTHROSCOPY KNEE WITH DEBRIDEMENT/SHAVING (CHONDROPLASTY), ARTHROSCOPY KNEE  ChondroplastyWITH MEDIAL AND LATERAL  MENISECTOMY;  Surgeon: Evalene JONETTA Chancy, MD;  Location: [facility];  Service: Orthopedics;  Laterality: Left;   KNEE ARTHROSCOPY WITH MEDIAL PATELLAR FEMORAL  LIGAMENT RECONSTRUCTION Left 12/20/2022   Procedure: KNEE ARTHROSCOPY WITH MEDIAL PATELLAR FEMORAL LIGAMENT RECONSTRUCTION;  Surgeon: Genelle Standing, MD;  Location: [facility];  Service: Orthopedics;  Laterality: Left;   left knee arthroscopy     MITRAL VALVE REPAIR Right 11/28/2018   Procedure: MINIMALLY INVASIVE MITRAL VALVE REPAIR (MVR) - Using Memo 4D Ring Size 32;  Surgeon: Dusty Sudie DEL, MD;  Location: [facility];  Service: Open Heart Surgery;  Laterality: Right;   patrial hysterectomy     POLYPECTOMY     SALPINGOOPHORECTOMY  2022   SYNOVECTOMY Left 06/07/2013   Procedure: SYNOVECTOMY;  Surgeon: Evalene JONETTA Chancy, MD;  Location: [facility];  Service: Orthopedics;  Laterality: Left;   TEE WITHOUT CARDIOVERSION  10/11/2011   Procedure: TRANSESOPHAGEAL ECHOCARDIOGRAM (TEE);  Surgeon: Maude JAYSON Emmer, MD;  Location: [facility];  Service: Cardiovascular;  Laterality: N/A;   TEE WITHOUT CARDIOVERSION N/A 11/28/2018   Procedure: TRANSESOPHAGEAL ECHOCARDIOGRAM (TEE);  Surgeon: Dusty Sudie DEL, MD;  Location: [facility] OR;  Service: Open Heart Surgery;  Laterality: N/A;   TUBAL LIGATION     WISDOM TOOTH EXTRACTION      Social History [name]  reports that she has been smoking cigars and cigarettes. She started smoking about 25 years ago. She has a 6 pack-year smoking history. She has never used smokeless tobacco. She reports current alcohol use of about 2.0 standard drinks of alcohol per week. She reports that she does not  use drugs.  family history includes Breast cancer in her cousin and another family member; Colon cancer (age of onset: 19) in her paternal aunt; Colon cancer (age of onset: 55) in her paternal uncle; Colon cancer (age of onset: 72) in her paternal uncle; Colon cancer (age of onset: 48) in her father; Colon polyps in her daughter, father, maternal uncle, paternal aunt, paternal uncle, and paternal uncle; Multiple myeloma (age of onset: 71) in  her maternal grandmother; Other in her daughter and mother; Parkinsonism in her paternal grandfather; Prostate cancer (age of onset: 28) in her maternal uncle; Rectal cancer in her father and paternal uncle; Stomach cancer in her father and paternal uncle.  Allergies[1]     PHYSICAL EXAMINATION: Vital signs: BP 125/76   Pulse 74   Temp 97.9 F (36.6 C) (Temporal)   Ht 5' 3 (1.6 m)   Wt 185 lb (83.9 kg)   LMP 01/12/2012   SpO2 100%   BMI 32.77 kg/m  General: Well-developed, well-nourished, no acute distress HEENT: Sclerae are anicteric, conjunctiva pink. Oral mucosa intact Lungs: Clear Heart: Regular Abdomen: soft, nontender, nondistended, no obvious ascites, no peritoneal signs, normal bowel sounds. No organomegaly. Extremities: No edema Psychiatric: alert and oriented x3. Cooperative     ASSESSMENT:  1.  Lynch syndrome 2.  History of adenomatous and sessile serrated polyps   PLAN:  Surveillance colonoscopy         [1]  Allergies Allergen Reactions   Bee Venom Swelling and Rash

## 2024-06-18 NOTE — Progress Notes (Signed)
 Pt's states no medical or surgical changes since previsit or office visit.

## 2024-06-19 ENCOUNTER — Telehealth: Payer: Self-pay

## 2024-06-19 NOTE — Telephone Encounter (Signed)
 Left message on follow up call.

## 2024-06-20 LAB — SURGICAL PATHOLOGY

## 2024-06-25 ENCOUNTER — Encounter

## 2024-06-26 ENCOUNTER — Ambulatory Visit: Payer: Self-pay | Admitting: Internal Medicine

## 2024-06-28 ENCOUNTER — Ambulatory Visit (HOSPITAL_COMMUNITY)
Admission: RE | Admit: 2024-06-28 | Discharge: 2024-06-28 | Disposition: A | Discharge status: Home / Self Care | Source: Ambulatory Visit | Attending: Cardiology | Admitting: Cardiology

## 2024-06-28 DIAGNOSIS — R9431 Abnormal electrocardiogram [ECG] [EKG]: Secondary | ICD-10-CM | POA: Insufficient documentation

## 2024-06-28 DIAGNOSIS — R072 Precordial pain: Secondary | ICD-10-CM | POA: Insufficient documentation

## 2024-06-28 DIAGNOSIS — I10 Essential (primary) hypertension: Secondary | ICD-10-CM | POA: Diagnosis present

## 2024-06-28 DIAGNOSIS — I340 Nonrheumatic mitral (valve) insufficiency: Secondary | ICD-10-CM | POA: Diagnosis present

## 2024-06-29 ENCOUNTER — Other Ambulatory Visit (HOSPITAL_BASED_OUTPATIENT_CLINIC_OR_DEPARTMENT_OTHER): Payer: Self-pay

## 2024-07-01 ENCOUNTER — Ambulatory Visit: Admitting: Internal Medicine

## 2024-07-01 ENCOUNTER — Encounter: Payer: Self-pay | Admitting: Cardiology

## 2024-07-01 ENCOUNTER — Ambulatory Visit: Payer: Self-pay

## 2024-07-01 ENCOUNTER — Other Ambulatory Visit (HOSPITAL_BASED_OUTPATIENT_CLINIC_OR_DEPARTMENT_OTHER): Payer: Self-pay

## 2024-07-01 NOTE — Telephone Encounter (Signed)
 FYI Only or Action Required?: FYI only for provider: appointment scheduled on 12/29.  Patient was last seen in primary care on 04/24/2024 by Micheal Wolm ORN, MD.  Called Nurse Triage reporting Abdominal Pain.  Symptoms began several days ago.  Interventions attempted: OTC medications: Tylenol  and Rest, hydration, or home remedies.  Symptoms are: rapidly worsening.  Triage Disposition: See HCP Within 4 Hours (Or PCP Triage)  Patient/caregiver understands and will follow disposition?: Yes        Copied from CRM #[id number]. Topic: Clinical - Red Word Triage >> Jul 01, 2024 12:01 PM [name] wrote: [facility] that prompted transfer to Nurse Triage: really bad stomach pain, tender in her belly, elevated fever diarrhea    ----------------------------------------------------------------------- From previous Reason for Contact - Scheduling: Patient/patient representative is calling to schedule an appointment. Refer to attachments for appointment information. Reason for Disposition  [1] MILD-MODERATE pain AND [2] constant AND [3] present > 2 hours  Answer Assessment - Initial Assessment Questions 1. LOCATION: Where does it hurt?      Middle-left sided   2. RADIATION: Does the pain shoot anywhere else? (e.g., chest, back)     No  3. ONSET: When did the pain begin? (e.g., minutes, hours or days ago)      X 3 days ago  4. SUDDEN: Gradual or sudden onset?     Gradual   5. PATTERN Does the pain come and go, or is it constant?     Intermittent   6. SEVERITY: How bad is the pain?  (e.g., Scale 1-10; mild, moderate, or severe)     3/10  7. RECURRENT SYMPTOM: Have you ever had this type of stomach pain before? If Yes, ask: When was the last time? and What happened that time?      Yes a few years ago  8. CAUSE: What do you think is causing the stomach pain? (e.g., gallstones, recent abdominal surgery)     Unsure   9. RELIEVING/AGGRAVATING FACTORS: What makes  it better or worse? (e.g., antacids, bending or twisting motion, bowel movement)     Eating makes the pain worse  10. OTHER SYMPTOMS: Do you have any other symptoms? (e.g., back pain, diarrhea, fever, urination pain, vomiting)       Fever yesterday, now resolved. Diarrhea x 1 day; three loose today    Patient called in to triage with complaints of abdominal pain. This has been ongoing for x 3 days The patient stated the pain is getting gradually worse, after eating.   Appointment scheduled for further evaluation; and agrees with the plan of care, and will reach out if symptoms worsen or persist.  Protocols used: Abdominal Pain - [facility]

## 2024-07-02 ENCOUNTER — Encounter: Payer: Self-pay | Admitting: Cardiology

## 2024-07-03 NOTE — Telephone Encounter (Signed)
 Spoke with pt regarding her results. Pt agreeable to plan. Pt scheduled to see Dr. Lavona 1/27. A message will be sent to scheduling so that the pt can be added to schedule sooner if there is a cancellation. Pt verbalized understanding. All questions if any were answered. Results sent to PCP

## 2024-07-03 NOTE — Telephone Encounter (Signed)
-----   Message from Lynwood Schilling, MD sent at 07/02/2024  8:26 PM EST ----- The mitral valve is moderately to severely stiff. I would like for her to have a TEE.  She needs to be seen in the office prior to this either with me or an APP to discuss this with her.  Call Ms.  Billy with the results and send results to Joshua Debby CROME, MD

## 2024-07-09 ENCOUNTER — Encounter: Payer: Self-pay | Admitting: Cardiology

## 2024-07-10 ENCOUNTER — Encounter: Admitting: Pediatrics

## 2024-07-10 NOTE — Progress Notes (Signed)
" °  Cardiology Office Note:   Date:  07/11/2024  ID:  [name], DOB 1975-12-04, MRN [id number] PCP: Joshua Debby CROME, MD  [facility] Providers Cardiologist:  Lynwood Schilling, MD {  History of Present Illness:   [name] is a 49 y.o. female  who presents for follow up of mitral valve prolapse and mitral regurgitation noted in 2013 with bileaflet prolapse.  The patient had a minimally invasive mitral valve repair using a Memo 4D ring size 32 (right chest) by Dr. Sudie Balloon on 11/28/2018.   She had an echo in December 2023 with normal MV function.  She did have some non anginal chest pain in August 2025 and she had enzymes at her primary care office and her troponin was minimally elevated.  However, when she went to the ER it was not elevated. EKG was not acute.  She had a chest CT which did not demonstrate pulmonary embolism but there was a nodule.   Follow up echo in Nov 2025 demonstrated moderate to severe MS.    I brought her back to discuss a TEE.  She continues to have cough.  This is mostly non productive.  She was to see ENT but canceled this appointment.  She is not having fevers or chills.  She does have to sleep on a few pillows.  She is not having any weight gain or edema.  She has not had any new shortness of breath, PND or orthopnea.  Chest x-ray at the last appointment did not demonstrate any acute findings.  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  She walks there but does not exercise routinely.   ROS: As stated in the HPI and negative for all other systems.  Studies Reviewed:    EKG:     NA   Risk Assessment/Calculations:              Physical Exam:   VS:  BP 111/70 (BP Location: Left Arm, Patient Position: Sitting)   Pulse (!) 57   Ht 5' 3 (1.6 m)   Wt 190 lb 6.4 oz (86.4 kg)   LMP 01/12/2012   SpO2 100%   BMI 33.73 kg/m    Wt Readings from Last 3 Encounters:  07/11/24 190 lb 6.4 oz (86.4 kg)  06/18/24 185 lb (83.9 kg)  06/05/24 185 lb  (83.9 kg)     GEN: Well nourished, well developed in no acute distress NECK: No JVD; No carotid bruits CARDIAC: RRR, no murmurs, rubs, gallops RESPIRATORY: Diffuse coarse crackles ABDOMEN: Soft, non-tender, non-distended EXTREMITIES:  No edema; No deformity   ASSESSMENT AND PLAN:   Mitral valve disease:   She had increased stenosis on echo in Nov.  I will schedule a TEE.   Hypertension:   The blood pressure is at target. No change in medications is indicated. We will continue with therapeutic lifestyle changes (TLC).  Cardiomyopathy:   Her EF was well preserved in Nov 2025.   No change in therapy.   Prolonged QT:      QT is mildly prolonged on previous EKG.  and she has had no symptoms related to this.  No change in therapy.  She should avoid further QT prolonging drugs.       Follow up with me in 4 months.    Signed, Lynwood Schilling, MD   "

## 2024-07-11 ENCOUNTER — Other Ambulatory Visit (HOSPITAL_BASED_OUTPATIENT_CLINIC_OR_DEPARTMENT_OTHER): Payer: Self-pay

## 2024-07-11 ENCOUNTER — Encounter: Payer: Self-pay | Admitting: Cardiology

## 2024-07-11 ENCOUNTER — Other Ambulatory Visit: Payer: Self-pay | Admitting: *Deleted

## 2024-07-11 ENCOUNTER — Ambulatory Visit: Attending: Cardiology | Admitting: Cardiology

## 2024-07-11 VITALS — BP 111/70 | HR 57 | Ht 63.0 in | Wt 190.4 lb

## 2024-07-11 DIAGNOSIS — I050 Rheumatic mitral stenosis: Secondary | ICD-10-CM

## 2024-07-11 DIAGNOSIS — R072 Precordial pain: Secondary | ICD-10-CM | POA: Diagnosis not present

## 2024-07-11 DIAGNOSIS — I340 Nonrheumatic mitral (valve) insufficiency: Secondary | ICD-10-CM

## 2024-07-11 DIAGNOSIS — I10 Essential (primary) hypertension: Secondary | ICD-10-CM

## 2024-07-11 DIAGNOSIS — I341 Nonrheumatic mitral (valve) prolapse: Secondary | ICD-10-CM | POA: Diagnosis not present

## 2024-07-11 DIAGNOSIS — Z01812 Encounter for preprocedural laboratory examination: Secondary | ICD-10-CM | POA: Diagnosis not present

## 2024-07-11 LAB — ECHOCARDIOGRAM COMPLETE
Area-P 1/2: 2.63 cm2
MV VTI: 0.9 cm2
S' Lateral: 2.5 cm

## 2024-07-11 NOTE — Patient Instructions (Addendum)
 Medication Instructions:  Your physician recommends that you continue on your current medications as directed. Please refer to the Current Medication list given to you today.  *If you need a refill on your cardiac medications before your next appointment, please call your pharmacy*  Lab Work: CBC, BMET today at [facility] - first floor  If you have labs (blood work) drawn today and your tests are completely normal, you will receive your results only by: MyChart Message (if you have MyChart) OR A paper copy in the mail If you have any lab test that is abnormal or we need to change your treatment, we will call you to review the results.  Testing/Procedures: Transesophageal Echocardiogram on Friday 07/19/24 with Dr. Loni  Follow-Up: At [facility], you and your health needs are our priority.  As part of our continuing mission to provide you with exceptional heart care, our providers are all part of one team.  This team includes your primary Cardiologist (physician) and Advanced Practice Providers or APPs (Physician Assistants and Nurse Practitioners) who all work together to provide you with the care you need, when you need it.  Your next appointment:   4 month(s)  Provider:   Lynwood Schilling, MD    We recommend signing up for the patient portal called MyChart.  Sign up information is provided on this After Visit Summary.  MyChart is used to connect with patients for Virtual Visits (Telemedicine).  Patients are able to view lab/test results, encounter notes, upcoming appointments, etc.  Non-urgent messages can be sent to your provider as well.   To learn more about what you can do with MyChart, go to [email or web address].   Other Instructions      Dear [name]  You are scheduled for a TEE (Transesophageal Echocardiogram) on Friday, January 16 with Dr. Loni.  Please arrive at the [facility] (Main Entrance A) at [facility]: [address]  [place], [place] [address] at 8:30 AM (This time is 1 hour(s) before your procedure to ensure your preparation).   Free valet parking service is available. You will check in at ADMITTING.   *Please Note: You will receive a call the day before your procedure to confirm the appointment time. That time may have changed from the original time based on the schedule for that day.*   DIET:  Nothing to eat or drink after midnight except a sip of water with medications (see medication instructions below)  MEDICATION INSTRUCTIONS: !!IF ANY NEW MEDICATIONS ARE STARTED AFTER TODAY, PLEASE NOTIFY YOUR PROVIDER AS SOON AS POSSIBLE!!  FYI: Medications such as Semaglutide  (Ozempic , Wegovy ), Tirzepatide (Mounjaro, Zepbound), Dulaglutide (Trulicity), etc (GLP1 agonists) AND Canagliflozin (Invokana), Dapagliflozin (Farxiga), Empagliflozin  (Jardiance ), Ertugliflozin (Steglatro), Bexagliflozin Arboriculturist) or any combination with one of these drugs such as Invokamet (Canagliflozin/Metformin), Synjardy (Empagliflozin /Metformin), etc (SGLT2 inhibitors) must be held around the time of a procedure. This is not a comprehensive list of all of these drugs. Please review all of your medications and talk to your provider if you take any one of these. If you are not sure, ask your provider.    HOLD: Empagliflozin  (Jardiance ) for 3 days prior to the procedure.    LABS: CBC and BMET today -- first floor  FYI:  For your safety, and to allow us  to monitor your vital signs accurately during the surgery/procedure we request: If you have artificial nails, gel coating, SNS etc, please have those removed prior to your surgery/procedure. Not having the nail coverings /polish removed  may result in cancellation or delay of your surgery/procedure.  Your support person will be asked to wait in the waiting room during your procedure.  It is OK to have someone drop you off and come back when you are ready to be discharged.  You cannot drive  after the procedure and will need someone to drive you home.  Bring your insurance cards.  *Special Note: Every effort is made to have your procedure done on time. Occasionally there are emergencies that occur at the hospital that may cause delays. Please be patient if a delay does occur.

## 2024-07-11 NOTE — Telephone Encounter (Signed)
 Pt seen in clinic by Dr. Lavona 07/12/23

## 2024-07-12 ENCOUNTER — Other Ambulatory Visit (HOSPITAL_BASED_OUTPATIENT_CLINIC_OR_DEPARTMENT_OTHER): Payer: Self-pay

## 2024-07-12 ENCOUNTER — Ambulatory Visit (INDEPENDENT_AMBULATORY_CARE_PROVIDER_SITE_OTHER): Admitting: Family Medicine

## 2024-07-12 VITALS — BP 128/84 | HR 76 | Temp 99.2°F | Ht 63.0 in | Wt 193.6 lb

## 2024-07-12 DIAGNOSIS — J069 Acute upper respiratory infection, unspecified: Secondary | ICD-10-CM

## 2024-07-12 DIAGNOSIS — R0982 Postnasal drip: Secondary | ICD-10-CM

## 2024-07-12 DIAGNOSIS — R051 Acute cough: Secondary | ICD-10-CM

## 2024-07-12 LAB — CBC
Hematocrit: 46 % (ref 34.0–46.6)
Hemoglobin: 14.5 g/dL (ref 11.1–15.9)
MCH: 29.5 pg (ref 26.6–33.0)
MCHC: 31.5 g/dL (ref 31.5–35.7)
MCV: 94 fL (ref 79–97)
Platelets: 255 x10E3/uL (ref 150–450)
RBC: 4.92 x10E6/uL (ref 3.77–5.28)
RDW: 14.3 % (ref 11.7–15.4)
WBC: 7.4 x10E3/uL (ref 3.4–10.8)

## 2024-07-12 LAB — BASIC METABOLIC PANEL WITH GFR
BUN/Creatinine Ratio: 10 (ref 9–23)
BUN: 10 mg/dL (ref 6–24)
CO2: 24 mmol/L (ref 20–29)
Calcium: 9.3 mg/dL (ref 8.7–10.2)
Chloride: 103 mmol/L (ref 96–106)
Creatinine, Ser: 1 mg/dL (ref 0.57–1.00)
Glucose: 135 mg/dL — ABNORMAL HIGH (ref 70–99)
Potassium: 3.6 mmol/L (ref 3.5–5.2)
Sodium: 142 mmol/L (ref 134–144)
eGFR: 69 mL/min/1.73

## 2024-07-12 MED ORDER — PREDNISONE 20 MG PO TABS
40.0000 mg | ORAL_TABLET | Freq: Every day | ORAL | 0 refills | Status: AC
  Filled 2024-07-12: qty 10, 5d supply, fill #0

## 2024-07-12 MED ORDER — PROMETHAZINE-DM 6.25-15 MG/5ML PO SYRP
5.0000 mL | ORAL_SOLUTION | Freq: Four times a day (QID) | ORAL | 0 refills | Status: AC | PRN
  Filled 2024-07-12: qty 180, 5d supply, fill #0

## 2024-07-12 NOTE — Progress Notes (Signed)
 "  Established Patient Office Visit   Subjective  Patient ID: [name], female    DOB: 10/17/1975  Age: 49 y.o. MRN: [id number]  Chief Complaint  Patient presents with   Acute Visit    Patient came in today for a cough started in Oct patient started feeling worse 5 days ago with headache, Ear pressure, yellow-mucus, temp, patient's sister had COVID around christmas     Patient is a 49 year old female followed by Debby Molt, MD who is seen for acute concern.  Patient endorses productive cough, throat irritation, ear congestion, rhinorrhea and occasional headache x 6 or 7 days.  Patient states she had a fever over the weekend with temperature 100.34F.  Patient denies wheezing, facial pain/pressure, chills, nausea, vomiting.  Patient took leftover rx cough syrup but has not tried anything else for symptoms.  Possible sick contacts include patient's at dialysis center.      Patient Active Problem List   Diagnosis Date Noted   Precordial chest pain 05/20/2024   Right thyroid  nodule 03/26/2024   Nodule of right lobe of thyroid  gland 03/24/2024   Abnormal electrocardiogram (ECG) (EKG) 02/15/2024   Chronic fatigue 02/15/2024   Immunization due 02/15/2024   Encounter for general adult medical examination with abnormal findings 02/15/2024   SCC (squamous cell carcinoma), leg, right 02/15/2024   Monoallelic mutation of PMS2 gene 09/22/2023   Constipation 04/16/2023   Diuretic-induced hypokalemia 01/23/2023   Class 1 obesity due to excess calories with serious comorbidity and body mass index (BMI) of 34.0 to 34.9 in adult 01/23/2023   Patellar instability of left knee 10/04/2022   Primary osteoarthritis of left knee 09/07/2022   Primary hypertriglyceridemia 03/06/2020   Hyperglycemia 03/05/2020   Narrowing of intervertebral disc space 06/18/2019   Cardiomyopathy (HCC) 01/17/2019   Chronic diastolic CHF (congestive heart failure) (HCC) 12/05/2018   Essential hypertension  07/10/2018   Other specified hypothyroidism 09/22/2014   Visit for screening mammogram 09/22/2014   Insomnia, persistent 03/19/2014   Lynch syndrome 11/28/2012   MR (mitral regurgitation) 10/28/2011   MVP (mitral valve prolapse) 10/28/2011   ALLERGIC RHINITIS 11/06/2007   GERD 11/06/2007   Past Medical History:  Diagnosis Date   Adenomatous colon polyp 12/2012   Allergic rhinitis    Allergy    Arthritis    Cellulitis    Common migraine    Diastolic dysfunction    Dysrhythmia    External hemorrhoid    Genital herpes    GERD (gastroesophageal reflux disease)    Heart murmur    Hx of migraines    Hyperlipidemia    Hypertension    Lynch syndrome    Mitral regurgitation    MVP (mitral valve prolapse)    Plica syndrome of left knee    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   32 mm Sorin Memo 4D ring annuloplasty via right mini thoracotomy approach   Squamous cell carcinoma of skin    Ulcer    Past Surgical History:  Procedure Laterality Date   ABDOMINAL HYSTERECTOMY     CARDIAC VALVE REPLACEMENT     Repair   CHOLECYSTECTOMY N/A 01/16/2020   Procedure: LAPAROSCOPIC CHOLECYSTECTOMY WITH INTRAOPERATIVE CHOLANGIOGRAM;  Surgeon: Eletha Boas, MD;  Location: [facility];  Service: General;  Laterality: N/A;   COLONOSCOPY     KNEE ARTHROSCOPY WITH MACI  CARTILAGE HARVEST Left 10/04/2022   Procedure: LEFT KNEE ARTHROSCOPY WITH MACI  CARTILAGE HARVEST;  Surgeon: Genelle Standing, MD;  Location: [facility];  Service:  Orthopedics;  Laterality: Left;   KNEE ARTHROSCOPY WITH MEDIAL MENISECTOMY Left 06/07/2013   Procedure: LEFT KNEE ARTHROSCOPY WITH SYNOVECTOMY LIMITED, ARTHROSCOPY KNEE WITH DEBRIDEMENT/SHAVING (CHONDROPLASTY), ARTHROSCOPY KNEE  ChondroplastyWITH MEDIAL AND LATERAL  MENISECTOMY;  Surgeon: Evalene JONETTA Chancy, MD;  Location: [facility];  Service: Orthopedics;  Laterality: Left;   KNEE ARTHROSCOPY WITH MEDIAL PATELLAR FEMORAL LIGAMENT  RECONSTRUCTION Left 12/20/2022   Procedure: KNEE ARTHROSCOPY WITH MEDIAL PATELLAR FEMORAL LIGAMENT RECONSTRUCTION;  Surgeon: Genelle Standing, MD;  Location: [facility];  Service: Orthopedics;  Laterality: Left;   left knee arthroscopy     MITRAL VALVE REPAIR Right 11/28/2018   Procedure: MINIMALLY INVASIVE MITRAL VALVE REPAIR (MVR) - Using Memo 4D Ring Size 32;  Surgeon: Dusty Sudie DEL, MD;  Location: [facility] OR;  Service: Open Heart Surgery;  Laterality: Right;   patrial hysterectomy     POLYPECTOMY     SALPINGOOPHORECTOMY  2022   SYNOVECTOMY Left 06/07/2013   Procedure: SYNOVECTOMY;  Surgeon: Evalene JONETTA Chancy, MD;  Location: [facility];  Service: Orthopedics;  Laterality: Left;   TEE WITHOUT CARDIOVERSION  10/11/2011   Procedure: TRANSESOPHAGEAL ECHOCARDIOGRAM (TEE);  Surgeon: Maude JAYSON Emmer, MD;  Location: [facility];  Service: Cardiovascular;  Laterality: N/A;   TEE WITHOUT CARDIOVERSION N/A 11/28/2018   Procedure: TRANSESOPHAGEAL ECHOCARDIOGRAM (TEE);  Surgeon: Dusty Sudie DEL, MD;  Location: [facility] OR;  Service: Open Heart Surgery;  Laterality: N/A;   TUBAL LIGATION     WISDOM TOOTH EXTRACTION     Social History[1] Family History  Problem Relation Age of Onset   Other Mother        lynch syndrome   Stomach cancer Father    Rectal cancer Father    Colon polyps Father    Colon cancer Father 42   Colon polyps Maternal Uncle    Prostate cancer Maternal Uncle 64   Colon polyps Paternal Aunt    Colon cancer Paternal Aunt 30   Stomach cancer Paternal Uncle    Rectal cancer Paternal Uncle    Colon polyps Paternal Uncle    Colon cancer Paternal Uncle 48   Colon polyps Paternal Uncle    Colon cancer Paternal Uncle 67   Multiple myeloma Maternal Grandmother 33   Parkinsonism Paternal Grandfather    Colon polyps Daughter    Other Daughter        Lynch syndrome   Breast cancer Cousin        lynch syndrome    Breast cancer Other        Paternal Great  Grandmother   Esophageal cancer Neg Hx    Allergies[2]  ROS Negative unless stated above    Objective:     BP 128/84 (BP Location: Left Arm, Patient Position: Sitting, Cuff Size: Large)   Pulse 76   Temp 99.2 F (37.3 C) (Oral)   Ht 5' 3 (1.6 m)   Wt 193 lb 9.6 oz (87.8 kg)   LMP 01/12/2012   SpO2 99%   BMI 34.29 kg/m  BP Readings from Last 3 Encounters:  07/12/24 128/84  07/11/24 111/70  06/18/24 103/68   Wt Readings from Last 3 Encounters:  07/12/24 193 lb 9.6 oz (87.8 kg)  07/11/24 190 lb 6.4 oz (86.4 kg)  06/18/24 185 lb (83.9 kg)      Physical Exam Constitutional:      General: She is not in acute distress.    Appearance: Normal appearance.  HENT:     Head: Normocephalic and atraumatic.  Right Ear: Tympanic membrane normal.     Left Ear: Tympanic membrane normal.     Nose: Nose normal.     Mouth/Throat:     Mouth: Mucous membranes are moist.     Pharynx: Postnasal drip present. No pharyngeal swelling, oropharyngeal exudate or posterior oropharyngeal erythema.  Cardiovascular:     Rate and Rhythm: Normal rate and regular rhythm.     Heart sounds: Normal heart sounds. No murmur heard.    No gallop.  Pulmonary:     Effort: Pulmonary effort is normal. No respiratory distress.     Breath sounds: Normal breath sounds. No wheezing, rhonchi or rales.  Skin:    General: Skin is warm and dry.  Neurological:     Mental Status: She is alert and oriented to person, place, and time.        04/22/2024    9:31 AM 12/25/2023    1:26 PM 01/23/2023    9:38 AM  Depression screen PHQ 2/9  Decreased Interest 0 0 0  Down, Depressed, Hopeless 0 0 0  PHQ - 2 Score 0 0 0  Altered sleeping   0  Tired, decreased energy   0  Change in appetite   0  Feeling bad or failure about yourself    0  Trouble concentrating   0  Moving slowly or fidgety/restless   0  Suicidal thoughts   0  PHQ-9 Score   0      Data saved with a previous flowsheet row definition        No data to display           No results found for any visits on 07/12/24.    Assessment & Plan:   Upper respiratory tract infection, unspecified type  Acute cough  Post-nasal drainage  Acute URI symptoms likely viral in etiology.  Supportive care advised.  Discussed antihistamine such as Allegra for postnasal drainage or Flonase.  Patient initially agreed to prescription and states she will get over-the-counter.  Discussed OTC cough medications safe with other meds such as Coricidin HBP or Mucinex  HBP.  Return for f/u with PCP if symptoms worsen or fail to improve.   Clotilda JONELLE Single, MD     [1]  Social History Tobacco Use   Smoking status: Some Days    Current packs/day: 0.00    Average packs/day: 0.5 packs/day for 12.0 years (6.0 ttl pk-yrs)    Types: Cigars, Cigarettes    Start date: 11/29/1998    Last attempt to quit: 11/29/2010    Years since quitting: 13.6   Smokeless tobacco: Never  Vaping Use   Vaping status: Never Used  Substance Use Topics   Alcohol use: Yes    Alcohol/week: 2.0 standard drinks of alcohol    Types: 2 Glasses of wine per week    Comment: social   Drug use: No  [2]  Allergies Allergen Reactions   Bee Venom Swelling and Rash   "

## 2024-07-14 ENCOUNTER — Ambulatory Visit: Payer: Self-pay | Admitting: Cardiology

## 2024-07-14 ENCOUNTER — Other Ambulatory Visit (HOSPITAL_COMMUNITY): Payer: Self-pay

## 2024-07-15 ENCOUNTER — Telehealth: Payer: Self-pay

## 2024-07-15 NOTE — Telephone Encounter (Signed)
-----   Message from Lynwood Schilling, MD sent at 07/14/2024  8:40 PM EST ----- Please cancel her TEE.  My partners looked again at her TTE and they do not think that there is any significant stiffness of the heart valve.  Good news.  I will call her to discuss.

## 2024-07-15 NOTE — Telephone Encounter (Signed)
 Called patient to let her know her TEE is being canceled. Called and got TEE canceled at hospital. Patient would like to discuss this with Dr. Lavona. Will send message to Dr. Lavona, so he can call patient and discuss. Patient agreed to plan.

## 2024-07-19 ENCOUNTER — Ambulatory Visit (HOSPITAL_COMMUNITY): Admit: 2024-07-19 | Admitting: Internal Medicine

## 2024-07-19 ENCOUNTER — Encounter (HOSPITAL_COMMUNITY): Payer: Self-pay

## 2024-07-19 SURGERY — TRANSESOPHAGEAL ECHOCARDIOGRAM (TEE) (CATHLAB)
Anesthesia: Monitor Anesthesia Care

## 2024-07-22 ENCOUNTER — Other Ambulatory Visit (HOSPITAL_BASED_OUTPATIENT_CLINIC_OR_DEPARTMENT_OTHER): Payer: Self-pay

## 2024-07-23 ENCOUNTER — Telehealth: Payer: Self-pay | Admitting: Cardiology

## 2024-07-23 ENCOUNTER — Other Ambulatory Visit: Payer: Self-pay

## 2024-07-23 ENCOUNTER — Other Ambulatory Visit (HOSPITAL_BASED_OUTPATIENT_CLINIC_OR_DEPARTMENT_OTHER): Payer: Self-pay

## 2024-07-23 NOTE — Telephone Encounter (Signed)
 We received an FMLA form from [employer] (employer) on 06/21/2024.  I have spoken to the patient several times who said that she will come in to pay and sign.  After several conversations and leaving messages, I decided to send the forms to her asking her to bring it in whenever she is ready for us  to complete it.

## 2024-07-30 ENCOUNTER — Other Ambulatory Visit: Payer: Self-pay | Admitting: Cardiology

## 2024-07-30 ENCOUNTER — Ambulatory Visit (HOSPITAL_COMMUNITY)
Admission: RE | Admit: 2024-07-30 | Discharge: 2024-07-30 | Disposition: A | Discharge status: Home / Self Care | Source: Ambulatory Visit

## 2024-07-30 ENCOUNTER — Ambulatory Visit: Admitting: Cardiology

## 2024-07-30 ENCOUNTER — Encounter (HOSPITAL_COMMUNITY): Payer: Self-pay

## 2024-07-30 DIAGNOSIS — Z9889 Other specified postprocedural states: Secondary | ICD-10-CM

## 2024-07-30 DIAGNOSIS — I050 Rheumatic mitral stenosis: Secondary | ICD-10-CM

## 2024-07-30 LAB — ECHOCARDIOGRAM LIMITED
Area-P 1/2: 3.32 cm2
MV VTI: 1.5 cm2
S' Lateral: 2.8 cm

## 2024-07-31 ENCOUNTER — Encounter: Payer: Self-pay | Admitting: Cardiology

## 2024-08-02 ENCOUNTER — Other Ambulatory Visit (HOSPITAL_BASED_OUTPATIENT_CLINIC_OR_DEPARTMENT_OTHER): Payer: Self-pay

## 2024-08-05 ENCOUNTER — Other Ambulatory Visit (HOSPITAL_BASED_OUTPATIENT_CLINIC_OR_DEPARTMENT_OTHER): Payer: Self-pay

## 2024-08-05 ENCOUNTER — Ambulatory Visit: Payer: Self-pay | Admitting: Cardiology

## 2024-08-06 ENCOUNTER — Other Ambulatory Visit: Payer: Self-pay

## 2024-08-13 ENCOUNTER — Ambulatory Visit: Admitting: Physician Assistant
